# Patient Record
Sex: Female | Born: 1940
Health system: Southern US, Community
[De-identification: ages and names within clinical notes are randomized; demographics above are authoritative.]

## PROBLEM LIST (undated history)

## (undated) DIAGNOSIS — I10 Essential (primary) hypertension: Secondary | ICD-10-CM

## (undated) DIAGNOSIS — I639 Cerebral infarction, unspecified: Secondary | ICD-10-CM

## (undated) DIAGNOSIS — Z8742 Personal history of other diseases of the female genital tract: Secondary | ICD-10-CM

## (undated) DIAGNOSIS — Z8719 Personal history of other diseases of the digestive system: Secondary | ICD-10-CM

## (undated) DIAGNOSIS — E785 Hyperlipidemia, unspecified: Secondary | ICD-10-CM

## (undated) DIAGNOSIS — E559 Vitamin D deficiency, unspecified: Secondary | ICD-10-CM

## (undated) DIAGNOSIS — M1990 Unspecified osteoarthritis, unspecified site: Secondary | ICD-10-CM

## (undated) DIAGNOSIS — K635 Polyp of colon: Secondary | ICD-10-CM

## (undated) DIAGNOSIS — D649 Anemia, unspecified: Secondary | ICD-10-CM

## (undated) HISTORY — PX: TONSILLECTOMY: SUR1361

## (undated) HISTORY — DX: Personal history of other diseases of the digestive system: Z87.19

## (undated) HISTORY — PX: APPENDECTOMY: SHX54

## (undated) HISTORY — DX: Polyp of colon: K63.5

## (undated) HISTORY — DX: Vitamin D deficiency, unspecified: E55.9

## (undated) HISTORY — PX: KNEE SURGERY: SHX244

## (undated) HISTORY — DX: Personal history of other diseases of the female genital tract: Z87.42

## (undated) HISTORY — DX: Unspecified osteoarthritis, unspecified site: M19.90

## (undated) HISTORY — DX: Anemia, unspecified: D64.9

## (undated) HISTORY — DX: Hyperlipidemia, unspecified: E78.5

## (undated) HISTORY — PX: LAPAROSCOPY: SHX197

## (undated) HISTORY — PX: KNEE ARTHROSCOPY: SUR90

---

## 1968-11-11 HISTORY — PX: LAPAROSCOPY: SHX197

## 1974-03-13 HISTORY — PX: OTHER SURGICAL HISTORY: SHX169

## 2000-02-11 LAB — HM DEXA SCAN: HM Dexa Scan: NORMAL

## 2004-01-08 ENCOUNTER — Other Ambulatory Visit: Admission: RE | Admit: 2004-01-08 | Discharge: 2004-01-08 | Payer: Self-pay | Admitting: Family Medicine

## 2004-02-24 ENCOUNTER — Ambulatory Visit: Payer: Self-pay | Admitting: Family Medicine

## 2004-02-25 LAB — FECAL OCCULT BLOOD, GUAIAC: Fecal Occult Blood: NEGATIVE

## 2004-03-01 ENCOUNTER — Ambulatory Visit: Payer: Self-pay | Admitting: Family Medicine

## 2004-03-16 ENCOUNTER — Ambulatory Visit: Payer: Self-pay | Admitting: Family Medicine

## 2004-03-24 ENCOUNTER — Ambulatory Visit: Payer: Self-pay | Admitting: Family Medicine

## 2004-05-06 ENCOUNTER — Ambulatory Visit: Payer: Self-pay | Admitting: Family Medicine

## 2004-06-23 ENCOUNTER — Ambulatory Visit: Payer: Self-pay | Admitting: Family Medicine

## 2004-07-20 ENCOUNTER — Ambulatory Visit: Payer: Self-pay | Admitting: Family Medicine

## 2004-10-10 ENCOUNTER — Ambulatory Visit: Payer: Self-pay | Admitting: Family Medicine

## 2004-10-12 ENCOUNTER — Encounter: Admission: RE | Admit: 2004-10-12 | Discharge: 2004-10-12 | Payer: Self-pay | Admitting: Family Medicine

## 2005-08-17 ENCOUNTER — Ambulatory Visit: Payer: Self-pay | Admitting: Family Medicine

## 2006-01-24 ENCOUNTER — Ambulatory Visit: Payer: Self-pay | Admitting: Family Medicine

## 2006-01-24 ENCOUNTER — Encounter: Payer: Self-pay | Admitting: Family Medicine

## 2006-01-24 ENCOUNTER — Other Ambulatory Visit: Admission: RE | Admit: 2006-01-24 | Discharge: 2006-01-24 | Payer: Self-pay | Admitting: Family Medicine

## 2006-01-24 LAB — CONVERTED CEMR LAB: Pap Smear: NORMAL

## 2006-02-05 ENCOUNTER — Ambulatory Visit: Payer: Self-pay | Admitting: Family Medicine

## 2006-10-22 ENCOUNTER — Encounter: Payer: Self-pay | Admitting: Family Medicine

## 2006-10-22 DIAGNOSIS — R740 Nonspecific elevation of levels of transaminase and lactic acid dehydrogenase [LDH]: Secondary | ICD-10-CM

## 2006-10-22 DIAGNOSIS — R7401 Elevation of levels of liver transaminase levels: Secondary | ICD-10-CM | POA: Insufficient documentation

## 2006-10-22 DIAGNOSIS — G252 Other specified forms of tremor: Secondary | ICD-10-CM

## 2006-10-22 DIAGNOSIS — G250 Essential tremor: Secondary | ICD-10-CM | POA: Insufficient documentation

## 2006-10-22 DIAGNOSIS — E785 Hyperlipidemia, unspecified: Secondary | ICD-10-CM | POA: Insufficient documentation

## 2006-10-22 DIAGNOSIS — M1990 Unspecified osteoarthritis, unspecified site: Secondary | ICD-10-CM | POA: Insufficient documentation

## 2006-10-22 DIAGNOSIS — F609 Personality disorder, unspecified: Secondary | ICD-10-CM | POA: Insufficient documentation

## 2006-10-23 ENCOUNTER — Ambulatory Visit: Payer: Self-pay | Admitting: Family Medicine

## 2006-10-23 DIAGNOSIS — N950 Postmenopausal bleeding: Secondary | ICD-10-CM | POA: Insufficient documentation

## 2006-11-06 ENCOUNTER — Ambulatory Visit: Payer: Self-pay | Admitting: Family Medicine

## 2006-11-06 ENCOUNTER — Encounter: Payer: Self-pay | Admitting: Family Medicine

## 2006-11-27 ENCOUNTER — Ambulatory Visit: Payer: Self-pay | Admitting: Family Medicine

## 2006-12-11 ENCOUNTER — Ambulatory Visit: Payer: Self-pay | Admitting: Family Medicine

## 2007-07-04 ENCOUNTER — Ambulatory Visit: Payer: Self-pay | Admitting: Family Medicine

## 2007-07-16 ENCOUNTER — Ambulatory Visit: Payer: Self-pay | Admitting: Internal Medicine

## 2007-07-30 ENCOUNTER — Encounter: Payer: Self-pay | Admitting: Internal Medicine

## 2007-07-30 ENCOUNTER — Ambulatory Visit: Payer: Self-pay | Admitting: Internal Medicine

## 2007-08-02 ENCOUNTER — Encounter: Payer: Self-pay | Admitting: Internal Medicine

## 2007-10-24 ENCOUNTER — Encounter: Payer: Self-pay | Admitting: Family Medicine

## 2007-11-12 DIAGNOSIS — Z8719 Personal history of other diseases of the digestive system: Secondary | ICD-10-CM

## 2007-11-12 HISTORY — DX: Personal history of other diseases of the digestive system: Z87.19

## 2007-11-30 ENCOUNTER — Inpatient Hospital Stay: Payer: Self-pay | Admitting: Internal Medicine

## 2007-12-01 ENCOUNTER — Encounter: Payer: Self-pay | Admitting: Family Medicine

## 2007-12-04 ENCOUNTER — Encounter: Payer: Self-pay | Admitting: Family Medicine

## 2007-12-11 ENCOUNTER — Ambulatory Visit: Payer: Self-pay | Admitting: Family Medicine

## 2007-12-11 DIAGNOSIS — Z8719 Personal history of other diseases of the digestive system: Secondary | ICD-10-CM | POA: Insufficient documentation

## 2007-12-13 LAB — CONVERTED CEMR LAB
Calcium: 9.2 mg/dL (ref 8.4–10.5)
GFR calc Af Amer: 128 mL/min
GFR calc non Af Amer: 106 mL/min
Potassium: 3.9 meq/L (ref 3.5–5.1)
Sodium: 136 meq/L (ref 135–145)
Total CHOL/HDL Ratio: 4.1
Triglycerides: 94 mg/dL (ref 0–149)

## 2008-05-25 ENCOUNTER — Ambulatory Visit: Payer: Self-pay | Admitting: Family Medicine

## 2008-06-04 ENCOUNTER — Encounter: Payer: Self-pay | Admitting: Family Medicine

## 2008-06-07 ENCOUNTER — Encounter: Admission: RE | Admit: 2008-06-07 | Discharge: 2008-06-07 | Payer: Self-pay | Admitting: Surgery

## 2008-06-25 ENCOUNTER — Inpatient Hospital Stay (HOSPITAL_COMMUNITY): Admission: EM | Admit: 2008-06-25 | Discharge: 2008-06-29 | Payer: Self-pay | Admitting: Emergency Medicine

## 2008-06-25 ENCOUNTER — Encounter: Payer: Self-pay | Admitting: Internal Medicine

## 2008-06-25 ENCOUNTER — Ambulatory Visit: Payer: Self-pay | Admitting: Internal Medicine

## 2008-06-25 DIAGNOSIS — E876 Hypokalemia: Secondary | ICD-10-CM | POA: Insufficient documentation

## 2008-06-26 ENCOUNTER — Encounter: Payer: Self-pay | Admitting: Family Medicine

## 2008-06-27 ENCOUNTER — Encounter: Payer: Self-pay | Admitting: Family Medicine

## 2008-08-06 ENCOUNTER — Ambulatory Visit: Payer: Self-pay | Admitting: Family Medicine

## 2008-08-11 ENCOUNTER — Encounter: Payer: Self-pay | Admitting: Family Medicine

## 2008-08-13 ENCOUNTER — Encounter (INDEPENDENT_AMBULATORY_CARE_PROVIDER_SITE_OTHER): Payer: Self-pay | Admitting: *Deleted

## 2008-08-13 LAB — CONVERTED CEMR LAB
Eosinophils Absolute: 0.1 10*3/uL (ref 0.0–0.7)
Lymphocytes Relative: 47.1 % — ABNORMAL HIGH (ref 12.0–46.0)
Lymphs Abs: 1.4 10*3/uL (ref 0.7–4.0)
MCHC: 35 g/dL (ref 30.0–36.0)
Monocytes Relative: 10 % (ref 3.0–12.0)
Neutro Abs: 1.3 10*3/uL — ABNORMAL LOW (ref 1.4–7.7)
Neutrophils Relative %: 40.9 % — ABNORMAL LOW (ref 43.0–77.0)
Platelets: 143 10*3/uL — ABNORMAL LOW (ref 150.0–400.0)
RDW: 13 % (ref 11.5–14.6)
WBC: 3.1 10*3/uL — ABNORMAL LOW (ref 4.5–10.5)

## 2009-06-18 ENCOUNTER — Ambulatory Visit: Payer: Self-pay | Admitting: Family Medicine

## 2009-06-18 DIAGNOSIS — D649 Anemia, unspecified: Secondary | ICD-10-CM | POA: Insufficient documentation

## 2009-06-18 DIAGNOSIS — R030 Elevated blood-pressure reading, without diagnosis of hypertension: Secondary | ICD-10-CM | POA: Insufficient documentation

## 2009-06-21 LAB — CONVERTED CEMR LAB
ALT: 23 units/L (ref 0–35)
AST: 32 units/L (ref 0–37)
BUN: 10 mg/dL (ref 6–23)
Basophils Absolute: 0 10*3/uL (ref 0.0–0.1)
Calcium: 9.5 mg/dL (ref 8.4–10.5)
Cholesterol: 222 mg/dL — ABNORMAL HIGH (ref 0–200)
Creatinine, Ser: 0.5 mg/dL (ref 0.4–1.2)
Direct LDL: 118.7 mg/dL
HCT: 33.3 % — ABNORMAL LOW (ref 36.0–46.0)
HDL: 96.6 mg/dL (ref >39.00)
Hemoglobin: 11.5 g/dL — ABNORMAL LOW (ref 12.0–15.0)
Lymphocytes Relative: 42.5 % (ref 12.0–46.0)
MCHC: 34.6 g/dL (ref 30.0–36.0)
Monocytes Relative: 10.6 % (ref 3.0–12.0)
Platelets: 167 10*3/uL (ref 150.0–400.0)
RDW: 13.4 % (ref 11.5–14.6)
Sodium: 135 meq/L (ref 135–145)
TSH: 1.21 microintl units/mL (ref 0.35–5.50)
Triglycerides: 44 mg/dL (ref 0.0–149.0)

## 2009-07-06 ENCOUNTER — Ambulatory Visit: Payer: Self-pay | Admitting: Family Medicine

## 2009-09-30 ENCOUNTER — Telehealth: Payer: Self-pay | Admitting: Family Medicine

## 2010-01-10 ENCOUNTER — Ambulatory Visit: Payer: Self-pay | Admitting: Family Medicine

## 2010-01-10 LAB — CONVERTED CEMR LAB
Basophils Absolute: 0 10*3/uL (ref 0.0–0.1)
Eosinophils Relative: 2.5 % (ref 0.0–5.0)
Ferritin: 30.5 ng/mL (ref 10.0–291.0)
Iron: 92 ug/dL (ref 42–145)
Lymphs Abs: 1.8 10*3/uL (ref 0.7–4.0)
MCHC: 34.4 g/dL (ref 30.0–36.0)
Monocytes Absolute: 0.5 10*3/uL (ref 0.1–1.0)
Neutro Abs: 3 10*3/uL (ref 1.4–7.7)
Neutrophils Relative %: 55.5 % (ref 43.0–77.0)
Platelets: 175 10*3/uL (ref 150.0–400.0)
RDW: 13.1 % (ref 11.5–14.6)
Transferrin: 289.2 mg/dL (ref 212.0–360.0)
WBC: 5.4 10*3/uL (ref 4.5–10.5)

## 2010-01-17 ENCOUNTER — Ambulatory Visit: Payer: Self-pay | Admitting: Family Medicine

## 2010-04-14 NOTE — Progress Notes (Signed)
Summary: wants hemoglobin checked  Phone Note Call from Patient Call back at Home Phone [phone]   Caller: Patient Call For: Judith Part MD Summary of Call: Patient came in to office asking if she could have her hemoglobin rechecked. It was last checked in april. Is it okay to put her on for lab schedule. Initial call taken by: Melody Comas,  September 30, 2009 9:46 AM  Follow-up for Phone Call        is fine to order cbc with diff and iron level and ferritin  for anemia  thanks  Follow-up by: Judith Part MD,  September 30, 2009 12:14 PM  Additional Follow-up for Phone Call Additional follow up Details #1::        Left message for patient to call back. Lewanda Rife LPN  September 30, 2009 12:23 PM   Patient notified as instructed by telephone. Pt said she had decided to take some iron capsules and she would call back to schedule lab appt if she decided she wanted it.Lewanda Rife LPN  September 30, 2009 3:23 PM

## 2010-04-14 NOTE — Assessment & Plan Note (Signed)
Summary: [facility]U TO DISCUSS POSSIBLE LABS NEEDED   Vital Signs:  Patient profile:   70 year old female Height:      65 inches Weight:      132.50 pounds BMI:     22.13 Temp:     97.6 degrees F oral Pulse rate:   60 / minute Pulse rhythm:   regular BP sitting:   160 / 78  (left arm) Cuff size:   regular  Vitals Entered By: Lewanda Rife LPN (June 18, 8117 11:48 AM)  Serial Vital Signs/Assessments:  Time      Position  BP       Pulse  Resp  Temp     By                     140/80                         Judith Part MD  CC: wants to discuss possible lab   History of Present Illness: has been feeling good overall   is concerned with her blood sugar and also cholesterol   had low hb and low wbc at last visit -- she had sched labs and never came back for them  she does not remember planning this or talking to the nurse  she does not think she has any memory problems, however either   has gained some of her weight back  is eating toomuch sugar she thinks and starting to eat 2 eggs and salmon  she seems vaguely upset about her wt gain but I assured she is at a more healthy wt down   some emotional ups and downs with stress -- does not want counseling or mental health care at this time no new -- just her usual  does not want to talk about it further  no more abd pain at all - that problem is over - /past abd pain and completely refused workup for it   thinks her father had HTN  bp is up today this distresses her greatly        Allergies: 1)  ! Penicillin 2)  Sulfa  Past History:  Past Medical History: Last updated: 06/25/2008 Hyperlipidemia Osteoarthritis- knee post men vag bleeding- gyn eval (?polyp) colon polyp pschizoaffective disorder / paranoid personality (refuses tx)   GI ---Dr Leone Pay[facility] psychiatry (past)-- Dr Alycia Rossetti  Past Surgical History: Last updated: 12/09/2007 Tonsillectomy Aarthroscopy right knee Lap- twisted fallopian tube  (1478'G) Appendectomy Bladder tack (1976) Colon polyps, per old records Dexa- normal (02/2000) Spirometry- normal (12/1999) Abd [place]- neg  (04/2004) 5/09 colonoscopy- adenomatous colon polyp- re check 5 years 9/09 hospitalization - small bowel obst (likely due to adhesions)-pt refused most dx and tx in hospital  Family History: Last updated: 10/22/2006 Father: heart problems, HTN, DM Mother: heart problems, DM Siblings:   Social History: Last updated: 06/25/2008 Marital Status: Married? (tells me she lives alone) Children: 3 Occupation: retired Runner, broadcasting/film/video non smoker   Risk Factors: Alcohol Use: 0 (06/25/2008)  Risk Factors: Smoking Status: never (06/25/2008)  Review of Systems General:  Complains of fatigue; denies chills, fever, loss of appetite, malaise, and weight loss. Eyes:  Denies blurring and eye pain. ENT:  Denies sinus pressure and sore throat. CV:  Denies chest pain or discomfort, palpitations, shortness of breath with exertion, and swelling of feet. Resp:  Denies cough and wheezing. GI:  Denies abdominal pain, bloody stools, change in bowel habits,  hemorrhoids, indigestion, loss of appetite, nausea, and vomiting. GU:  Denies discharge, dysuria, hematuria, and urinary frequency. MS:  Denies joint pain, joint redness, and joint swelling. Derm:  Denies itching, lesion(s), poor wound healing, and rash. Neuro:  Denies numbness and tingling. Psych:  Complains of anxiety and depression; denies panic attacks, sense of great danger, and suicidal thoughts/plans; does not want to elaborate on her mental state -- she still refuses psychiatric care. Endo:  Denies excessive thirst, excessive urination, and heat intolerance. Heme:  Denies abnormal bruising and bleeding.  Physical Exam  General:  Well-developed,well-nourished,in no acute distress; alert,appropriate and cooperative throughout examination Head:  normocephalic, atraumatic, and no abnormalities observed.   Eyes:   vision grossly intact, pupils equal, pupils round, and pupils reactive to light.  no conjunctival pallor, injection or icterus  Ears:  R ear normal and L ear normal.   Nose:  no nasal discharge.   Mouth:  pharynx pink and moist.   Neck:  supple with full rom and no masses or thyromegally, no JVD or carotid bruit  Chest Wall:  No deformities, masses, or tenderness noted. Lungs:  Normal respiratory effort, chest expands symmetrically. Lungs are clear to auscultation, no crackles or wheezes. Heart:  Normal rate and regular rhythm. S1 and S2 normal without gallop, murmur, click, rub or other extra sounds. Abdomen:  Bowel sounds positive,abdomen soft and non-tender without masses, organomegaly or hernias noted. no renal bruits  Msk:  No deformity or scoliosis noted of thoracic or lumbar spine.  no acute joint changes  Pulses:  R and L carotid,radial,femoral,dorsalis pedis and posterior tibial pulses are full and equal bilaterally Extremities:  No clubbing, cyanosis, edema, or deformity noted with normal full range of motion of all joints.   Neurologic:  strength normal in all extremities, gait normal, and DTRs symmetrical and normal.   Skin:  Intact without suspicious lesions or  some lentigos  Cervical Nodes:  No lymphadenopathy noted Axillary Nodes:  No palpable lymphadenopathy Inguinal Nodes:  No significant adenopathy Psych:  gaurded and somewhat odd affect- but no different from her usual  gets upset easily  eye contact is fair    Impression & Recommendations:  Problem # 1:  UNSPECIFIED ANEMIA (ICD-285.9) Assessment New pt failed to f/u for re check last time  check cbc today f/u to disc  no new symptoms  Orders: Venipuncture (04540) TLB-Lipid Panel (80061-LIPID) TLB-BMP (Basic Metabolic Panel-BMET) (80048-METABOL) TLB-CBC Platelet - w/Differential (85025-CBCD) TLB-ALT (SGPT) (84460-ALT) TLB-AST (SGOT) (84450-SGOT) TLB-TSH (Thyroid Stimulating Hormone) (84443-TSH)  Problem #  2:  ELEVATED BLOOD PRESSURE WITHOUT DIAGNOSIS OF HYPERTENSION (ICD-796.2) Assessment: New bp was better on second check today pt concerned about it and has fam hx  handouts given on lifestyle change f/u 2-4 wk for re check disc dash diet  Orders: Venipuncture (98119) TLB-Lipid Panel (80061-LIPID) TLB-BMP (Basic Metabolic Panel-BMET) (80048-METABOL) TLB-CBC Platelet - w/Differential (85025-CBCD) TLB-ALT (SGPT) (84460-ALT) TLB-AST (SGOT) (84450-SGOT) TLB-TSH (Thyroid Stimulating Hormone) (84443-TSH)  Problem # 3:  HYPERLIPIDEMIA (ICD-272.4) Assessment: Unchanged  check lipids today per pt diet is poor would likely never consider medication Orders: Venipuncture (14782) TLB-Lipid Panel (80061-LIPID) TLB-BMP (Basic Metabolic Panel-BMET) (80048-METABOL) TLB-CBC Platelet - w/Differential (85025-CBCD) TLB-ALT (SGPT) (84460-ALT) TLB-AST (SGOT) (84450-SGOT) TLB-TSH (Thyroid Stimulating Hormone) (84443-TSH)    HDL:45.5 (12/11/2007)  LDL:121 (12/11/2007)  Chol:185 (12/11/2007)  Trig:94 (12/11/2007)  Problem # 4:  HYPOKALEMIA, MILD (ICD-276.8) Assessment: Comment Only in past -- re check this today no cramps or other symptoms Orders: Venipuncture (95621) TLB-Lipid Panel (80061-LIPID) TLB-BMP (  Basic Metabolic Panel-BMET) (80048-METABOL) TLB-CBC Platelet - w/Differential (85025-CBCD) TLB-ALT (SGPT) (84460-ALT) TLB-AST (SGOT) (84450-SGOT) TLB-TSH (Thyroid Stimulating Hormone) (84443-TSH)  Complete Medication List: 1)  B Complex Tabs (B complex vitamins) .... One by mouth daily 2)  Ginkgo Biloba Extr (Ginkgo biloba) .... Take 1 tablet by mouth once a day 3)  Vitamin E  .... Daily 4)  Vitamin C 500 Mg Tabs (Ascorbic acid) .... Take 1 tablet by mouth once a day 5)  Kelp Tabs (Iodine (kelp)) .... Take 1 tablet by mouth once a day 6)  Resevertrol  .... Take 1 tablet by mouth once a day  Patient Instructions: 1)  checking labs today 2)  watch salt and sugar in your diet  3)  follow  up in 2-4 weeks to discuss lab results  4)  here are some handouts on high blood pressure to read   Current Allergies (reviewed today): ! PENICILLIN SULFA

## 2010-04-14 NOTE — Assessment & Plan Note (Signed)
Summary: TO DISCUSS LABS  LABS DONE ON 10/31/[facility]   Vital Signs:  Patient profile:   69 year old female Height:      65 inches Weight:      134.50 pounds BMI:     22.46 Temp:     98.2 degrees F oral Pulse rate:   64 / minute Pulse rhythm:   regular BP sitting:   132 / 72  (left arm) Cuff size:   regular  Vitals Entered By: Lewanda Rife LPN (January 17, 2010 12:08 PM) CC: discuss lab results   History of Present Illness: here for f/u of mild anemia   in past iron def -- likely from very restrictive diet recommended she take mvi with iron   she has been taking iron -- is over the counter -- is otc  is not making her constipated   this hb is 11.9-- imp from 11.5 (at lowest was in 10s)  she declines GI workup   iron studies and ferritin are good   wt is down 2 lb  bp 132/72 today  wants to give blood -- was declined last time   has been feeling ok -- overall   pt reports she had a tapeworm over 15 years ago - she remembers passing it  has not noticed symptoms lately   does eat some meats -- eating chicken  is not eating ham  occ beef   is up to date on colonoscopy   Allergies: 1)  ! Penicillin 2)  Sulfa  Past History:  Past Medical History: Last updated: 06/25/2008 Hyperlipidemia Osteoarthritis- knee post men vag bleeding- gyn eval (?polyp) colon polyp pschizoaffective disorder / paranoid personality (refuses tx)   GI ---Dr Leone Payor psychiatry (past)-- Dr Alycia Rossetti  Past Surgical History: Last updated: 12/09/2007 Tonsillectomy Aarthroscopy right knee Lap- twisted fallopian tube (4782'N) Appendectomy Bladder tack (1976) Colon polyps, per old records Dexa- normal (02/2000) Spirometry- normal (12/1999) Abd [place]- neg  (04/2004) 5/09 colonoscopy- adenomatous colon polyp- re check 5 years 9/09 hospitalization - small bowel obst (likely due to adhesions)-pt refused most dx and tx in hospital  Family History: Last updated: 10/22/2006 Father: heart  problems, HTN, DM Mother: heart problems, DM Siblings:   Social History: Last updated: 06/25/2008 Marital Status: Married? (tells me she lives alone) Children: 3 Occupation: retired Runner, broadcasting/film/video non smoker   Risk Factors: Alcohol Use: 0 (06/25/2008)  Risk Factors: Smoking Status: never (06/25/2008)  Review of Systems General:  Complains of fatigue; denies chills, fever, loss of appetite, and malaise. Eyes:  Denies blurring and eye irritation. CV:  Denies chest pain or discomfort, palpitations, shortness of breath with exertion, and swelling of feet. Resp:  Denies cough and shortness of breath. GI:  Denies abdominal pain, bloody stools, diarrhea, indigestion, nausea, and vomiting. GU:  Denies hematuria and urinary frequency. MS:  Denies muscle aches and cramps. Derm:  Denies poor wound healing and rash. Neuro:  Denies numbness and tingling. Endo:  Complains of cold intolerance; denies excessive thirst, excessive urination, and heat intolerance. Heme:  Denies abnormal bruising, bleeding, and enlarge lymph nodes.  Physical Exam  General:  Well-developed,well-nourished,in no acute distress; alert,appropriate and cooperative throughout examination Head:  normocephalic, atraumatic, and no abnormalities observed.   Eyes:  vision grossly intact, pupils equal, pupils round, and pupils reactive to light.  no conjunctival pallor, injection or icterus  Mouth:  pharynx pink and moist.   Neck:  supple with full rom and no masses or thyromegally, no JVD or carotid bruit  Chest Wall:  No deformities, masses, or tenderness noted. Lungs:  Normal respiratory effort, chest expands symmetrically. Lungs are clear to auscultation, no crackles or wheezes. Heart:  Normal rate and regular rhythm. S1 and S2 normal without gallop, murmur, click, rub or other extra sounds. Abdomen:  Bowel sounds positive,abdomen soft and non-tender without masses, organomegaly or hernias noted. no renal bruits  Msk:  No  deformity or scoliosis noted of thoracic or lumbar spine.  no acute joint changes  Pulses:  R and L carotid,radial,femoral,dorsalis pedis and posterior tibial pulses are full and equal bilaterally Extremities:  No clubbing, cyanosis, edema, or deformity noted with normal full range of motion of all joints.   Neurologic:  sensation intact to light touch, gait normal, and DTRs symmetrical and normal.  no tremor  Skin:  Intact without suspicious lesions or rashes no pallor  Cervical Nodes:  No lymphadenopathy noted Inguinal Nodes:  No significant adenopathy Psych:  affect is mildly paranoid today   Impression & Recommendations:  Problem # 1:  UNSPECIFIED ANEMIA (ICD-285.9) Assessment Improved iron def anemia in past - imp with otc iron (though pt does not know name or dose) rev labs with pt in detail  she declines GI eval for this -- enc her to call if she changes her mind does have hx of colon polyps  do not think parasites are involved  disc diet -- little beef/ it trying to get iron from other sources  I offered to re check cbc in 3 mo -- she declines at this time adv to update me if she develops any new symptoms - especially GI  Complete Medication List: 1)  B Complex Tabs (B complex vitamins) .... One by mouth daily 2)  Ginkgo Biloba Extr (Ginkgo biloba) .... Take 1 tablet by mouth once a day 3)  Vitamin E  .... Daily 4)  Vitamin C 500 Mg Tabs (Ascorbic acid) .... Take 1 tablet by mouth once a day 5)  Kelp Tabs (Iodine (kelp)) .... Take 1 tablet by mouth once a day 6)  Resevertrol  .... Take 1 tablet by mouth once a day  Patient Instructions: 1)  continue your iron  2)  let me know what brand you are taking and what mg dose so I can put it on your med list  3)  keep working on getting iron in the diet  4)  let me know if you want GI doctor evaluation for anemia in the future  5)  your colonoscopy will be due in May of 2014  6)  if you have abdominal symptoms or blood in stool,  please let me know    Orders Added: 1)  Est. Patient Level III [65784]    Current Allergies (reviewed today): ! PENICILLIN SULFA

## 2010-04-14 NOTE — Assessment & Plan Note (Signed)
Summary: 2-4 WEEK FOLLOW UP/[facility]   Vital Signs:  Patient profile:   70 year old female Height:      65 inches Weight:      136 pounds BMI:     22.71 Temp:     98.2 degrees F oral Pulse rate:   60 / minute Pulse rhythm:   regular BP sitting:   154 / 74  (left arm) Cuff size:   regular  Vitals Entered By: Lewanda Rife LPN (July 06, 2009 3:09 PM)  Serial Vital Signs/Assessments:  Time      Position  BP       Pulse  Resp  Temp     By           R Arm     130/80                         Colon Flattery Collis Thede MD           L Arm     125/80                         Judith Part MD  CC: 2-4 week f/u   History of Present Illness: here for f/u of anemia and cholesterol and elevated bp   bp first check today 154/74  lipids improved with excellent hdl of 96 and LDL 118 diet could be a bit better she thinks   hb is 11.5- also improved , with nl wbc overall feels ok   still very emotionally stressed with things going on , but feels physically ok     Allergies: 1)  ! Penicillin 2)  Sulfa  Past History:  Past Medical History: Last updated: 06/25/2008 Hyperlipidemia Osteoarthritis- knee post men vag bleeding- gyn eval (?polyp) colon polyp pschizoaffective disorder / paranoid personality (refuses tx)   GI ---Dr Leone Payor psychiatry (past)-- Dr Alycia Rossetti  Past Surgical History: Last updated: 12/09/2007 Tonsillectomy Aarthroscopy right knee Lap- twisted fallopian tube (1610'R) Appendectomy Bladder tack (1976) Colon polyps, per old records Dexa- normal (02/2000) Spirometry- normal (12/1999) Abd [place]- neg  (04/2004) 5/09 colonoscopy- adenomatous colon polyp- re check 5 years 9/09 hospitalization - small bowel obst (likely due to adhesions)-pt refused most dx and tx in hospital  Family History: Last updated: 10/22/2006 Father: heart problems, HTN, DM Mother: heart problems, DM Siblings:   Social History: Last updated: 06/25/2008 Marital Status: Married? (tells me she lives  alone) Children: 3 Occupation: retired Runner, broadcasting/film/video non smoker   Risk Factors: Alcohol Use: 0 (06/25/2008)  Risk Factors: Smoking Status: never (06/25/2008)  Review of Systems General:  Complains of fatigue; denies fever, loss of appetite, and malaise. Eyes:  Denies blurring and eye irritation. CV:  Denies chest pain or discomfort, palpitations, shortness of breath with exertion, and swelling of feet. Resp:  Denies cough and wheezing. GI:  Denies abdominal pain, bloody stools, change in bowel habits, and indigestion. GU:  Denies dysuria and urinary frequency. MS:  Denies joint pain, joint redness, joint swelling, muscle aches, and muscle weakness. Derm:  Denies itching, lesion(s), poor wound healing, and rash. Neuro:  Denies numbness and tingling. Psych:  Denies sense of great danger and suicidal thoughts/plans. Endo:  Denies cold intolerance, excessive thirst, excessive urination, and heat intolerance. Heme:  Denies abnormal bruising and bleeding.  Physical Exam  General:  Well-developed,well-nourished,in no acute distress; alert,appropriate and cooperative throughout examination Head:  normocephalic, atraumatic, and no abnormalities observed.  Eyes:  vision grossly intact, pupils equal, pupils round, and pupils reactive to light.  no conjunctival pallor, injection or icterus  Mouth:  pharynx pink and moist.   Neck:  supple with full rom and no masses or thyromegally, no JVD or carotid bruit  Lungs:  Normal respiratory effort, chest expands symmetrically. Lungs are clear to auscultation, no crackles or wheezes. Heart:  Normal rate and regular rhythm. S1 and S2 normal without gallop, murmur, click, rub or other extra sounds. Abdomen:  soft, non-tender, and normal bowel sounds.   Extremities:  No clubbing, cyanosis, edema, or deformity noted with normal full range of motion of all joints.   Neurologic:  gait normal and DTRs symmetrical and normal.   Skin:  Intact without suspicious  lesions or  some lentigos  Cervical Nodes:  No lymphadenopathy noted Psych:  gaurded and somewhat odd affect- but no different from her usual  less anxious today  eye contact is fair    Impression & Recommendations:  Problem # 1:  UNSPECIFIED ANEMIA (ICD-285.9) Assessment Improved much imp this check  has had restrictive diet in past that is better I recommended adding mvi with iron  pt does not want GI w/u at this time  rev labs with pt in detail  Problem # 2:  ELEVATED BLOOD PRESSURE WITHOUT DIAGNOSIS OF HYPERTENSION (ICD-796.2) Assessment: Improved  this again was much imp on second check while relaxed urged to keep up good habits and will continue to monitor   BP today: 154/74-- re check 125/80 at rest  Prior BP: 160/78 (06/18/2009)  Labs Reviewed: Creat: 0.5 (06/18/2009) Chol: 222 (06/18/2009)   HDL: 96.60 (06/18/2009)   LDL: 121 (12/11/2007)   TG: 44.0 (06/18/2009)  Instructed in low sodium diet (DASH Handout) and behavior modification.    Problem # 3:  HYPERLIPIDEMIA (ICD-272.4) Assessment: Improved  good results with very high HDL - likely from fish consumption rev low sat fat diet and commended on this  will continue to  monitor   Labs Reviewed: SGOT: 32 (06/18/2009)   SGPT: 23 (06/18/2009)   HDL:96.60 (06/18/2009), 45.5 (12/11/2007)  LDL:121 (12/11/2007)  Chol:222 (06/18/2009), 185 (12/11/2007)  Trig:44.0 (06/18/2009), 94 (12/11/2007)  Complete Medication List: 1)  B Complex Tabs (B complex vitamins) .... One by mouth daily 2)  Ginkgo Biloba Extr (Ginkgo biloba) .... Take 1 tablet by mouth once a day 3)  Vitamin E  .... Daily 4)  Vitamin C 500 Mg Tabs (Ascorbic acid) .... Take 1 tablet by mouth once a day 5)  Kelp Tabs (Iodine (kelp)) .... Take 1 tablet by mouth once a day 6)  Resevertrol  .... Take 1 tablet by mouth once a day  Patient Instructions: 1)  get a multivitamin with iron to help your hemoglobin  2)  cholesterol is better  3)  sugar and  other labs are fine  4)  keep up healthy diet   Current Allergies (reviewed today): ! PENICILLIN SULFA

## 2010-06-22 LAB — DIFFERENTIAL
Eosinophils Relative: 2 % (ref 0–5)
Eosinophils Relative: 0 % (ref 0–5)
Lymphocytes Relative: 29 % (ref 12–46)
Lymphocytes Relative: 40 % (ref 12–46)
Monocytes Absolute: 0.2 10*3/uL (ref 0.1–1.0)
Monocytes Absolute: 0.3 10*3/uL (ref 0.1–1.0)
Monocytes Absolute: 0.6 10*3/uL (ref 0.1–1.0)
Monocytes Relative: 6 % (ref 3–12)
Neutro Abs: 1.6 10*3/uL — ABNORMAL LOW (ref 1.7–7.7)
Neutro Abs: 7.8 10*3/uL — ABNORMAL HIGH (ref 1.7–7.7)
Neutrophils Relative %: 49 % (ref 43–77)
Neutrophils Relative %: 86 % — ABNORMAL HIGH (ref 43–77)

## 2010-06-22 LAB — CBC
HCT: 31.5 % — ABNORMAL LOW (ref 36.0–46.0)
HCT: 42.8 % (ref 36.0–46.0)
Hemoglobin: 10.4 g/dL — ABNORMAL LOW (ref 12.0–15.0)
Hemoglobin: 14.8 g/dL (ref 12.0–15.0)
MCHC: 34.5 g/dL (ref 30.0–36.0)
MCHC: 34.8 g/dL (ref 30.0–36.0)
MCHC: 35.1 g/dL (ref 30.0–36.0)
MCHC: 34.6 g/dL (ref 30.0–36.0)
MCV: 92.5 fL (ref 78.0–100.0)
Platelets: 140 10*3/uL — ABNORMAL LOW (ref 150–400)
RBC: 3.21 MIL/uL — ABNORMAL LOW (ref 3.87–5.11)
RBC: 4.6 MIL/uL (ref 3.87–5.11)
RDW: 13.6 % (ref 11.5–15.5)
RDW: 13.7 % (ref 11.5–15.5)
RDW: 13.6 % (ref 11.5–15.5)
WBC: 3.2 10*3/uL — ABNORMAL LOW (ref 4.0–10.5)
WBC: 4.2 10*3/uL (ref 4.0–10.5)

## 2010-06-22 LAB — COMPREHENSIVE METABOLIC PANEL
ALT: 35 U/L (ref 0–35)
Alkaline Phosphatase: 61 U/L (ref 39–117)
BUN: 13 mg/dL (ref 6–23)
CO2: 20 mEq/L (ref 19–32)
GFR calc non Af Amer: >60 mL/min (ref >60)
Glucose, Bld: 133 mg/dL — ABNORMAL HIGH (ref 70–99)
Potassium: 3.7 mEq/L (ref 3.5–5.1)
Sodium: 134 mEq/L — ABNORMAL LOW (ref 135–145)
Total Protein: 7 g/dL (ref 6.0–8.3)

## 2010-06-22 LAB — URINALYSIS, ROUTINE W REFLEX MICROSCOPIC
Bilirubin Urine: NEGATIVE
Glucose, UA: NEGATIVE mg/dL
Hgb urine dipstick: NEGATIVE
Ketones, ur: >80 mg/dL — AB
pH: 6 (ref 5.0–8.0)

## 2010-06-22 LAB — BASIC METABOLIC PANEL
BUN: 2 mg/dL — ABNORMAL LOW (ref 6–23)
CO2: 22 mEq/L (ref 19–32)
CO2: 24 mEq/L (ref 19–32)
CO2: 28 mEq/L (ref 19–32)
Chloride: 104 mEq/L (ref 96–112)
Chloride: 105 mEq/L (ref 96–112)
Chloride: 105 mEq/L (ref 96–112)
Creatinine, Ser: 0.46 mg/dL (ref 0.4–1.2)
GFR calc Af Amer: >60 mL/min (ref >60)
GFR calc Af Amer: >60 mL/min (ref >60)
GFR calc Af Amer: >60 mL/min (ref >60)
GFR calc non Af Amer: >60 mL/min (ref >60)
Glucose, Bld: 78 mg/dL (ref 70–99)
Glucose, Bld: 85 mg/dL (ref 70–99)
Potassium: 3.7 mEq/L (ref 3.5–5.1)
Potassium: 3.9 mEq/L (ref 3.5–5.1)
Sodium: 136 mEq/L (ref 135–145)
Sodium: 136 mEq/L (ref 135–145)

## 2010-06-22 LAB — CA 125: CA 125: 3.5 U/mL (ref 0.0–30.2)

## 2010-07-26 NOTE — Discharge Summary (Signed)
[name], [name]               ACCOUNT NO.:  [id number]   MEDICAL RECORD NO.:  [id number]          PATIENT TYPE:  INP   LOCATION:  5149                         FACILITY:  [facility]   PHYSICIAN:  Gordy Savers, MDDATE OF BIRTH:  12/29/1940   DATE OF ADMISSION:  06/25/2008  DATE OF DISCHARGE:  06/29/2008                               DISCHARGE SUMMARY   FINAL DIAGNOSIS:  Small-bowel obstruction, resolved.   ADDITIONAL DIAGNOSES:  1. Ascites.  2. History of voluntary weight loss.  3. Hypokalemia, resolved.  4. Remote 20-pack year smoker.  5. History of colonic polyps.  6. Anemia.   DISCHARGE MEDICATIONS:  Tylenol p.r.n., multivitamins.   HISTORY OF PRESENT ILLNESS:  The patient is a 70 year old white female,  who presented to the ED with a 24-hour history of worsening abdominal  pain, associated with nausea and vomiting.  One day prior to admission,  she had normal bowel movement.  In September of last year, she was  admitted to [facility] for similar symptoms that resolved without  surgical intervention.  The patient underwent a CT scan of the abdomen  and pelvis that demonstrated a partial small-bowel obstruction.  It also  revealed generalized ascites in the abdominal and pelvic regions.  The  patient is subsequently admitted for further evaluation and treatment of  her partial small-bowel obstruction.   PAST MEDICAL HISTORY:  The patient has an allergy to PENICILLIN and  SULFA.  The patient has mild dyslipidemia, osteoarthritis, and a history  of postmenopausal vaginal bleeding, has a history also of colonic  polyps.  Her last colonoscopy was in May 2009.   PHYSICAL EXAMINATION:  GENERAL:  An alert, well-developed, well-  nourished female, cooperative, in no acute distress.  General exam was  fairly noncontributory.  ABDOMEN:  Soft and nontender with diminished bowel sounds.  There is no  significant distention or tenderness.  There is no appreciable  organomegaly.  No fluid wave could be appreciated.   LABORATORY DATA AND HOSPITAL COURSE:  The patient was admitted to  hospital and supported with IV fluids.  A General Surgical consult was  obtained that followed the patient through the hospital.  Serial 2-way  abdominal x-rays were reviewed and at the time of discharge had total  resolution of her distended loops of bowel.  The CT of the abdomen and  pelvis with contrast revealed diffuse abdominal ascites of undetermined  etiology.  The patient was seen by Interventional Radiology for  ultrasound-guided paracentesis.  Limited ultrasound of the abdomen did  confirm diffuse ascites but without a window for percutaneous  aspiration.  It was felt the risk of bowel obstruction was too great and  paracentesis was not performed.  The hospital course was marked by  steady improvement.  At the time of discharge, she was tolerating a  normal diet and had no GI complaints.  Laboratory studies included a  normal albumin of 4.6.  BNP was normal at 60.  TSH normal.  CA-125  normal at 3.5.  It should also be noted that CT of the abdomen revealed  a left adnexal  cyst that appeared to be benign and unchanged compared to  an ultrasound in August 2006.   DISPOSITION:  The patient will be discharged today on her preadmission  regimen of vitamin supplements but on no new prescription medications.  She has been asked to follow up with her primary care Makynli Stills towards  the end of the week and will be considered for a repeat abdominopelvic  ultrasound at that time.  Due to her mild anemia and history of ascites,  we will consider outpatient Hematology/Oncology or GI evaluation as an  outpatient.   CONDITION ON DISCHARGE:  Improved.      Gordy Savers, MD  Electronically Signed     PFK/MEDQ  D:  06/29/2008  T:  06/30/2008  Job:  [id number]

## 2010-07-26 NOTE — Consult Note (Signed)
[name], [name]               ACCOUNT NO.:  [id number]   MEDICAL RECORD NO.:  [id number]          PATIENT TYPE:  INP   LOCATION:  5149                         FACILITY:  [facility]   PHYSICIAN:  Gabrielle Dare. Janee Morn, M.D.DATE OF BIRTH:  Nov 25, 1940   DATE OF CONSULTATION:  06/25/2008  DATE OF DISCHARGE:                                 CONSULTATION   CHIEF COMPLAINT:  Abdominal pain, nausea, and vomiting.   HISTORY OF PRESENT ILLNESS:  Ms. Fults is a 70 year old white female  who presents complaining of a 24-hour history of abdominal pain with  associated nausea and vomiting.  She claimed she had a large normal  bowel movement yesterday, but none since.  She came for evaluation in  the emergency department.  Since she has been here, her pain has almost  completely resolved.  She was admitted with the similar episode to  [facility] last September, but that resolved without any surgical  intervention.  She underwent CT scan of the abdomen and pelvis  demonstrating partial small bowel obstructive versus focal ileus  involving small bowel and her pelvis as well as generalized ascites.  We  are asked to see her from the surgical standpoint in consultation.   PAST MEDICAL HISTORY:  1. Osteoarthritis.  2. Hyperlipidemia.  3. Schizoaffective disorder, she declines treatment for her      schizoaffective disorder.   PAST SURGICAL HISTORY:  Right knee surgery, she also had an  exploration  for what sounds like a torsed fallopian tube at which time, appendectomy  was also done.  In addition, she has had bladder tacking.   SOCIAL HISTORY:  She does not smoke.  She does not drink alcohol.  She  is a retired Runner, broadcasting/film/video.   MEDICATIONS:  Supplements, but no prescriptions.   ALLERGIES:  PENICILLIN.   REVIEW OF SYSTEMS:  GI SYSTEM:  As above.  MUSCULOSKELETAL SYSTEM:  She  was recently seen by Dr. Luisa Hart from our service for a left thigh  swelling, this seems to be a pseudotumor of her  tensor fascia lata from  radiologic findings.  Review of systems is otherwise negative.   PHYSICAL EXAMINATION:  VITAL SIGNS:  Temperature 97.7, blood pressure  121/55, heart rate 72, respirations 18, and saturations 100%  GENERAL:  She is awake and alert.  She is quite thin and when asked  about this, she said she has lost a lot of weight over the past several  months, though she has been trying to do portion  control.  HEENT:  Pupils are equal.  Oral mucosa is moist.  LUNGS:  Clear to auscultation with no wheezing bilaterally.  CARDIAC:  Heart is regular with no murmurs and pulse is palpable on the  left chest.  ABDOMEN:  Soft.  She has some mild distention in the lower abdomen and  the suprapubic area with mild tenderness.  There is no focal mass felt.  Definitely, no guarding or peritoneal signs.  Bowel sounds are scant.  EXTREMITIES:  Minimal lateral deformities in the left thigh, otherwise  no deformity.  SKIN:  Warm and dry.  DATA REVIEWED:  White blood cell count 9, hemoglobin 14.8, and platelets  177.  Sodium 134, potassium 3.7, chloride 99, CO2 20, BUN 13, creatinine  0.74, glucose 133, AST 36, and ALT 35.  Urinalysis negative.  CT scan of  the abdomen and pelvis as described above.   IMPRESSION:  Partial small bowel obstruction.   RECOMMENDATION:  I agree with medical admission, IV fluids, and bowel  rest.  We will follow along with you and I discussed this with Dr.  Felicity Coyer.  The etiology of her ascites currently is uncertain.  Plan was  discussed in detail with the patient.      Gabrielle Dare Janee Morn, M.D.  Electronically Signed     BET/MEDQ  D:  06/25/2008  T:  06/26/2008  Job:  [id number]   cc:   Vikki Ports A. Felicity Coyer, MD

## 2010-07-26 NOTE — Assessment & Plan Note (Signed)
[name], [name]               ACCOUNT NO.:  [id number]   MEDICAL RECORD NO.:  [id number]          PATIENT TYPE:  POB   LOCATION:  [facility] at [facility]         FACILITY:  [facility]   PHYSICIAN:  Tinnie Gens, MD        DATE OF BIRTH:  May 24, 1940   DATE OF SERVICE:  11/06/2006                                  CLINIC NOTE   CHIEF COMPLAINT:  Referral.   HISTORY OF PRESENT ILLNESS:  The patient is a 70 year old gravida 5,  para 3-0-2-3, who presents today for postmenopausal bleeding.  The  patient has previously been seen by Dr. Roxy Manns.  She had an episode  of postmenopausal bleeding approximately 2-1/2 years ago and underwent  pelvic sonography at that time.  The patient had a thickened endometrium  on that ultrasound but refused further evaluation or workup.  Bleeding  returned approximately 3 months ago with some spotting and then had some  brighter red bleeding in the past couple of weeks.  The patient has come  today for referral and appropriate workup.   PAST MEDICAL HISTORY:  A history of arthritis, paranoia, pneumonia and  elevated cholesterol.   PAST SURGICAL HISTORY:  She had a bladder tacking done in 1980, an  arthroscopic knee surgery on the right in 1989.   MEDICATIONS:  She is on vitamin B one p.o. daily.   ALLERGIES:  PENICILLIN and SULFA.   OBSTETRICAL HISTORY:  G5 P3, three living children.  The biggest child  was 8-1/2 pounds.  Two miscarriages.   GYN HISTORY:  Last Pap was January 2007, last mammogram March 2005.  No  history of abnormal Paps.   FAMILY HISTORY:  Diabetes, heart disease and hypertension.   SOCIAL HISTORY:  The patient lives alone.  She has three children.  One  is in [place], one is in [place], one is in [place].  She has  five grandchildren.  She does not see her family very often.  No  tobacco, alcohol or drug use.   A 14-point review of systems is reviewed.  It is positive for fatigue,  weight gain, problems with hearing,  difficulty with urination in which  she describes and urgency issue, if she goes to the bathroom she has to  get there very quickly or else she will become incontinent.  She denies  any kind of stress-related incontinence.  Vaginal bleeding.  The patient  also reports that it feels like her organs are falling out.   On exam today, in general she is a well-nourished, elderly female in no  acute distress.  Pulse is 59, blood pressure 165/68, weight is 188.  GU:  Normal external female genitalia.  The cervix is visualized at the  introitus, giving her a level III prolapse of the uterus.  There are  some lesions on the cervix noted but nothing that looks suspicious.  The  uterus is small, anteverted.  No adnexal mass or tenderness.   PROCEDURE:  The cervix is cleaned with Betadine x2.  It is grasped  anteriorly with a single-tooth tenaculum, sounded to approximately 8 cm.  Endometrial sampling was obtained; however, very little tissue, probably  very atrophic  endometrial lining was felt to be there.   IMPRESSION:  1. Postmenopausal bleeding.  2. Need for yearly exam.  Plan for yearly Pap smear.  3. Urge incontinence.   PLAN:  1. Pap smear today.  2. Endometrial sampling today.  3. The patient will follow up in 2 weeks for a discussion of results.  4. Discussion was had today that if pathology is benign, pessary use      verses simple vaginal hysterectomy to alleviate her prolapse.  The      patient seemed to be leaning towards hysterectomy at this time as      she is an otherwise fairly healthy female.   Thank you very much for referring this patient.  We will keep you up to  date on her progress as the results become available as well as any  plans that we have.           ______________________________  Tinnie Gens, MD     TP/MEDQ  D:  11/06/2006  T:  11/07/2006  Job:  [id number]   cc:   Kelle Darting  Fax: [phone]

## 2010-07-26 NOTE — Assessment & Plan Note (Signed)
[name], [name]               ACCOUNT NO.:  [id number]   MEDICAL RECORD NO.:  [id number]          PATIENT TYPE:  POB   LOCATION:  [facility]         FACILITY:  [facility]   PHYSICIAN:  Tinnie Gens, MD        DATE OF BIRTH:  1940/12/08   DATE OF SERVICE:  11/27/2006                                  CLINIC NOTE   CHIEF COMPLAINT:  Followup.   HISTORY OF PRESENT ILLNESS:  The patient is a 70 year old patient who is  referred here from Dr. Roxy Manns for postmenopausal bleeding.  The  patient previously underwent pelvic sonogram that showed a thickened  lining.  She had an endometrial biopsy here approximately 2 weeks ago.  However, scant benign columnar mucosa was found with no atypia.  It is  unclear if the columnar mucosa was atrophic endometrium or cervix.  Because it was unclear that the biopsy was completely negative, I  suggested to the patient we take her to the OR for a D&C.  The patient  is very unwilling to have this done, nor is she willing at this point to  have hysterectomy or pessary either for her prolapse.  The patient did  ask a question about her difficulty emptying her bladder and how her  prolapsed uterus might be affecting that.  However, she still seemed  very reluctant to actually undergo any kind of procedure.  I offered her  a pelvic sonogram; the patient refused that.  She did agree to repeat  endometrial sampling today.   EXAMINATION:  Her vitals are as noted in the chart.  She is a well-  developed, well-nourished female in no acute distress, looks stated age.  GU:  Atrophic vagina.  The cervix was prolapsed.  The uterus is  approximately +3.   PROCEDURE:  The cervix was cleaned with Betadine and grasped on the  anteriorly lip with a single-tooth tenaculum.  A dilator was used this  time to get into the endometrial cavity.  The uterus sounded to 9 cm.  Endometrial sampling was obtained.  Again, not a lot of tissue was  gotten, but the Pipelle  was passed x2.   IMPRESSION:  1. Postmenopausal bleeding, cannot rule out endometrial carcinoma.  2. Prolapsed uterus.   PLAN:  Will have the patient follow up in two more weeks for results.  If this biopsy is negative, it is the best I can do to reassure her that  she probably does not have cancer and then it will be up to her if there  are any further treatments she would like to have done.   Again, thank you for this referral and will keep you updated on her  progress.          ______________________________  Tinnie Gens, MD    TP/MEDQ  D:  11/27/2006  T:  11/28/2006  Job:  [id number]   cc:   Roxy Manns, M.D.

## 2010-07-26 NOTE — Assessment & Plan Note (Signed)
[name], [name]               ACCOUNT NO.:  [id number]   MEDICAL RECORD NO.:  [id number]          PATIENT TYPE:  POB   LOCATION:  [facility]         FACILITY:  [facility]   PHYSICIAN:  Johnella Moloney, MD        DATE OF BIRTH:  [birth year]   DATE OF SERVICE:  12/11/2006                                  CLINIC NOTE   CHIEF COMPLAINT:  Follow up visit, discussion of results of endometrial  biopsy.   HISTORY OF PRESENT ILLNESS:  The patient is a 70 year old who was  referred for evaluation of post menopausal bleeding.  For further  details please refer to the notes by Dr. Tinnie Gens and Dr. Nicholaus Bloom  in the patient's chart.  She did undergo a pelvic ultrasound that showed  a thickened lining and underwent an endometrial biopsy on November 27, 2006 by Dr. Tinnie Gens.  She is here to follow up results.  She denies  any further post menopausal bleeding.   No interval change in medical history.   PHYSICAL EXAMINATION:  Deferred.  Blood pressure is 124/64 and weight is 185 pounds.   RESULTS:  Endometrial biopsy pathology shows benign endometrial polyp  and fragments of proliferative endometrium with stromal hemorrhage.  No  malignancy identified.   IMPRESSION:  1. Post menopausal bleeding with negative endometrial biopsy.  2. History of uterine prolapse.   PLAN:  Results were discussed with the patient who was reassured about  her results.  Still, explained to her that there is still a low risk,  less than 1% chance, that she could still have undiagnosed endometrial  carcinoma, especially in the endometrial polyp, that a standard of care  would be to do a dilation and curettage and a polypectomy, but patient  declines this at this point.  She was told if she does have any further  episodes of post menopausal bleeding, that this would be highly  recommended.  The patient understands plan and declines any further  procedures at this time.  As for her prolapsed uterus, also  discussed  options of using pessary versus a hysterectomy.  Discussed pros and cons  of both methods.  The patient, at this point, says that she does not  want to do anything about her uterine prolapse, but will decide on a  method of treatment is her prolapse becomes concerning to her.  The  patient denies  any other symptoms.  The patient was strongly advised to follow up if  there were any further episodes of post menopausal bleeding or any other  gynecological concerns.           ______________________________  Johnella Moloney, MD     UD/MEDQ  D:  12/11/2006  T:  12/11/2006  Job:  [id number]   cc:   Roxy Manns, MD  [address] [place]  [place], [place] [address]

## 2011-01-02 ENCOUNTER — Telehealth: Payer: Self-pay | Admitting: Family Medicine

## 2011-01-02 DIAGNOSIS — E876 Hypokalemia: Secondary | ICD-10-CM

## 2011-01-02 DIAGNOSIS — E785 Hyperlipidemia, unspecified: Secondary | ICD-10-CM

## 2011-01-02 DIAGNOSIS — R030 Elevated blood-pressure reading, without diagnosis of hypertension: Secondary | ICD-10-CM

## 2011-01-02 DIAGNOSIS — R7401 Elevation of levels of liver transaminase levels: Secondary | ICD-10-CM

## 2011-01-02 DIAGNOSIS — D649 Anemia, unspecified: Secondary | ICD-10-CM

## 2011-01-02 NOTE — Telephone Encounter (Signed)
Message copied by Judy Pimple on Mon Jan 02, 2011  9:02 PM ------      Message from: Baldomero Lamy      Created: Wed Dec 28, 2010  9:50 AM      Regarding: Cpx labs Tues10/23       Please order  future cpx labs for pt's upcomming lab appt.      Thanks      Rodney Booze

## 2011-01-03 ENCOUNTER — Other Ambulatory Visit: Payer: Self-pay

## 2011-01-09 ENCOUNTER — Encounter: Payer: Self-pay | Admitting: Family Medicine

## 2011-01-10 ENCOUNTER — Encounter: Payer: Self-pay | Admitting: Family Medicine

## 2011-04-06 ENCOUNTER — Ambulatory Visit (INDEPENDENT_AMBULATORY_CARE_PROVIDER_SITE_OTHER): Payer: MEDICARE | Admitting: Family Medicine

## 2011-04-06 ENCOUNTER — Encounter: Payer: Self-pay | Admitting: Family Medicine

## 2011-04-06 VITALS — BP 130/60 | HR 88 | Temp 100.5°F

## 2011-04-06 DIAGNOSIS — J069 Acute upper respiratory infection, unspecified: Secondary | ICD-10-CM

## 2011-04-06 DIAGNOSIS — J40 Bronchitis, not specified as acute or chronic: Secondary | ICD-10-CM

## 2011-04-06 MED ORDER — AZITHROMYCIN 250 MG PO TABS: ORAL_TABLET | ORAL | Status: AC

## 2011-04-06 MED ORDER — HYDROCOD POLST-CHLORPHEN POLST 10-8 MG/5ML PO LQCR: 5.0000 mL | Freq: Two times a day (BID) | ORAL | Status: DC | PRN

## 2011-04-06 NOTE — Patient Instructions (Signed)
Take antibiotic (zpack) as directed.  Drink lots of fluids.  Treat sympotmatically with Mucinex, nasal saline irrigation, and Tylenol/Ibuprofen. You can use warm compresses.  Cough suppressant at night. Call if not improving as expected in 5-7 days.

## 2011-04-06 NOTE — Progress Notes (Signed)
SUBJECTIVE:  [name] is a 71 y.o. female new to me who complains of coryza, congestion, sneezing, sore throat and productive cough for 8 days. She denies a history of anorexia and chest pain and denies a history of asthma. Patient denies smoke cigarettes.  She has not had a flu shot this year.   Patient Active Problem List  Diagnoses  . HYPERLIPIDEMIA  . HYPOKALEMIA, MILD  . UNSPECIFIED ANEMIA  . PERSONALITY DISORDER  . TREMOR, ESSENTIAL  . BLEEDING, POSTMENOPAUSAL  . OSTEOARTHRITIS  . TRANSAMINASES, SERUM, ELEVATED  . ELEVATED BLOOD PRESSURE WITHOUT DIAGNOSIS OF HYPERTENSION  . INTESTINAL OBSTRUCTION, HX OF   Past Medical History  Diagnosis Date  . Hyperlipidemia   . Osteoarthritis     knee  . History of vaginal bleeding     post menopausal- gyn eval, ? polyp  . Colon polyp   . Schizoaffective disorder     paranoid personality- refuses treatment  . History of small bowel obstruction 9/09    likely due to adhesions- pt refused most dx and tx in hospital   Past Surgical History  Procedure Date  . Tonsillectomy   . Knee arthroscopy     right  . Laparoscopy 1970's    twisted fallopian tube  . Appendectomy   . Bladder tack 1976   History  Substance Use Topics  . Smoking status: Never Smoker   . Smokeless tobacco: Not on file  . Alcohol Use: Not on file   Family History  Problem Relation Age of Onset  . Heart disease Mother   . Diabetes Mother   . Diabetes Father   . Heart disease Father   . Hypertension Father    Allergies  Allergen Reactions  . Penicillins     REACTION: rash/hives  . Sulfonamide Derivatives     REACTION: rash   Current Outpatient Prescriptions on File Prior to Visit  Medication Sig Dispense Refill  . b complex vitamins tablet Take 1 tablet by mouth daily.        Marland Kitchen GINKGO BILOBA COMPLEX PO Take one by mouth daily       . Kelp TABS Take one tablet by mouth daily       . vitamin C (ASCORBIC ACID) 500 MG tablet Take 500 mg by  mouth daily.        Marland Kitchen VITAMIN E PO Take by mouth daily         OBJECTIVE: BP 130/60  Pulse 88  Temp(Src) 100.5 F (38.1 C) (Oral)  SpO2 98%  She appears well, vital signs are as noted. Ears normal.  Throat and pharynx normal.  Neck supple. No adenopathy in the neck. Nose is congested. Sinuses non tender. The chest is clear, without wheezes or rales.  ASSESSMENT:  bronchitis  PLAN: Given duration and progression of symptoms, will treat for bacterial process. Symptomatic therapy suggested: push fluids, rest and return office visit prn if symptoms persist or worsen.  Call or return to clinic prn if these symptoms worsen or fail to improve as anticipated.

## 2011-04-10 ENCOUNTER — Telehealth: Payer: Self-pay | Admitting: Family Medicine

## 2011-04-10 NOTE — Telephone Encounter (Signed)
If she finished 4 out of 5 days of zpak - I feel comfortable with that If worse or fever (check temp)- follow up  If not improving more towards end of week f/u

## 2011-04-10 NOTE — Telephone Encounter (Signed)
Left vm for pt to callback 

## 2011-04-10 NOTE — Telephone Encounter (Signed)
Triage Record Num: [id number] Operator: Arline Asp Loftin Patient Name: [name] Call Date & Time: 04/10/2011 12:44:42PM Patient Phone: [phone] PCP: Idamae Schuller A. Tower Patient Gender: Female PCP Fax : Patient DOB: Jan 10, 1941 Practice Name: [facility] Reason for Call: Caller: [name]; PCP: Roxy Manns A.; CB#: [phone]; ; ; Call regarding "Would Like To Know How Long A. Cough s Germs Stay in the Air After You Cough Without Using A. Tissue and DO You Stop Taking Abx When You Feel Better?;" cough onset 01/18. Seen in office 01/24 for URI. Has been taking cough medicine with codeine, and Zpack for 4 days, then lost the pack. States her "cough is better." Phlegm is brownish green. Unsure of fever, but has been feeling chills and then hot (thermometer is broken). Mentioned she has "fever blisters under her nose" onset 01/28. Emergent sx negative. Care advice per "Cough, Adult" with disp to see provider within 24h due to "productive cough with colored sputum." Caller does not want appt at this time, requests to see if she needs another abx. OFFICE PLEASE CALL [name] AT [phone] AND ADVISE IF ANOTHER ABX HAS BEEN CALLED IN OR IF SHE MUST BE SEEN. [facility]. Protocol(s) Used: Cough - Adult Recommended Outcome per Protocol: See Provider within 24 hours Reason for Outcome: Productive cough with colored sputum (other than clear or white sputum) Care Advice: ~ Use a cool mist humidifier to moisten air. Be sure to clean according to manufacturer's instructions. Call provider if fever greater than 101.5 F (38.6 C) or 100.5 F (38.1C) in an immunocompromised patient (such as diabetes, HIV/AIDS, renal disease, chemotherapy, organ transplant, or chronic steroid use) has not improved in 24 hours. ~ Increase fluids to 8-12 eight oz (1.6 to 2.4 liters) glasses per day, half of them to be water. Soups, popsicles, fruit juices, non-caffeinated sodas (unless restricting  sodium intake), jello, broths, decaf teas, etc. are all okay. Warm fluids can be soothing. ~ Coughing up mucus or phlegm helps to get rid of an infection. A productive cough should not be stopped. A cough medicine with guaifenesin (Robitussin, Mucinex) can help loosen the mucus. Cough medicine with dextromethorphan (DM) should be avoided. Drinking lots of fluids can help loosen the mucus too, especially warm fluids. ~ 04/10/2011 1:04:14PM Page 1 of 1 [email or web address]

## 2011-04-11 ENCOUNTER — Encounter: Payer: Self-pay | Admitting: Family Medicine

## 2011-04-11 ENCOUNTER — Telehealth: Payer: Self-pay | Admitting: Family Medicine

## 2011-04-11 ENCOUNTER — Ambulatory Visit (INDEPENDENT_AMBULATORY_CARE_PROVIDER_SITE_OTHER): Payer: MEDICARE | Admitting: Family Medicine

## 2011-04-11 VITALS — BP 132/58 | HR 88 | Temp 98.8°F

## 2011-04-11 DIAGNOSIS — J209 Acute bronchitis, unspecified: Secondary | ICD-10-CM

## 2011-04-11 NOTE — Telephone Encounter (Signed)
Patient was seen last Thursday for Bronchitis.  Patient feel like it has moved into her chest and lungs.  Patient was prescribed an antibiotic,but she lost it.  The last time she took the antibiotic was on Sunday.  She uses [facility], community in [place].  Patient asked if she could be seen by you today.

## 2011-04-11 NOTE — Progress Notes (Signed)
Subjective:    Patient ID: [name], female    DOB: [birth year], 71 y.o.   MRN: [id number]  HPI Saw Dr Dayton Martes on 1/24 and given zpak for bronchitis  Took all 5 days of it  Symptoms that are very slightly improved but not as expected  - coughing more , but phlegm is more loose  Is having more pain in her chest - soreness radiating to the back (soaking 2-3 hot baths per day)  Is uncomfortable to take a deep breath  Yellow mucous  Took some mucinex - and that helped a bit  Was given tussionex for cough - made her sleepy - did not like how she felt with it but it helped her sleep   Days and nights are all mixed up- is quite tired   Overall sick since before the 18th   Her friend had pneumonia - and she did take care of her - she is totally better now   Patient Active Problem List  Diagnoses  . HYPERLIPIDEMIA  . HYPOKALEMIA, MILD  . UNSPECIFIED ANEMIA  . PERSONALITY DISORDER  . TREMOR, ESSENTIAL  . BLEEDING, POSTMENOPAUSAL  . OSTEOARTHRITIS  . TRANSAMINASES, SERUM, ELEVATED  . ELEVATED BLOOD PRESSURE WITHOUT DIAGNOSIS OF HYPERTENSION  . INTESTINAL OBSTRUCTION, HX OF  . Bronchitis, acute   Past Medical History  Diagnosis Date  . Hyperlipidemia   . Osteoarthritis     knee  . History of vaginal bleeding     post menopausal- gyn eval, ? polyp  . Colon polyp   . Schizoaffective disorder     paranoid personality- refuses treatment  . History of small bowel obstruction 9/09    likely due to adhesions- pt refused most dx and tx in hospital   Past Surgical History  Procedure Date  . Tonsillectomy   . Knee arthroscopy     right  . Laparoscopy 1970's    twisted fallopian tube  . Appendectomy   . Bladder tack 1976   History  Substance Use Topics  . Smoking status: Former Games developer  . Smokeless tobacco: Not on file  . Alcohol Use: Not on file   Family History  Problem Relation Age of Onset  . Heart disease Mother   . Diabetes Mother   . Diabetes Father   .  Heart disease Father   . Hypertension Father    Allergies  Allergen Reactions  . Penicillins     REACTION: rash/hives  . Sulfonamide Derivatives     REACTION: rash   Current Outpatient Prescriptions on File Prior to Visit  Medication Sig Dispense Refill  . b complex vitamins tablet Take 1 tablet by mouth daily.        . chlorpheniramine-HYDROcodone (TUSSIONEX PENNKINETIC ER) 10-8 MG/5ML LQCR Take 5 mLs by mouth every 12 (twelve) hours as needed.  140 mL  0  . vitamin C (ASCORBIC ACID) 500 MG tablet Take 500 mg by mouth daily.        Marland Kitchen VITAMIN E PO Take by mouth daily             Review of Systems Review of Systems  Constitutional: Negative for fever, appetite change,  and unexpected weight change. pps for fatigue  Eyes: Negative for pain and visual disturbance.  Respiratory: Negative for sob or wheeze  Cardiovascular: Negative for cp or palpitations    Gastrointestinal: Negative for nausea, diarrhea and constipation.  Genitourinary: Negative for urgency and frequency.  Skin: Negative for pallor or rash   Neurological:  Negative for weakness, light-headedness, numbness and headaches.  Hematological: Negative for adenopathy. Does not bruise/bleed easily.  Psychiatric/Behavioral: Negative for dysphoric mood. The patient is not nervous/anxious.          Objective:   Physical Exam  Constitutional: She appears well-developed and well-nourished. No distress.       Mildly fatigued / pleasant  HENT:  Head: Normocephalic and atraumatic.  Right Ear: External ear normal.  Left Ear: External ear normal.  Mouth/Throat: Oropharynx is clear and moist.       Nares are injected and congested   Mild post throat injection with post nasal drip  Eyes: Conjunctivae and EOM are normal. Pupils are equal, round, and reactive to light. Right eye exhibits no discharge. Left eye exhibits no discharge.  Neck: Normal range of motion. Neck supple. No thyromegaly present.  Cardiovascular: Normal  rate, regular rhythm and normal heart sounds.   Pulmonary/Chest: Effort normal and breath sounds normal. No respiratory distress. She has no wheezes. She has no rales. She exhibits tenderness.       Harsh bs throughout with hacking cough No rales/ rhonchi or wheeze Good air exch  Mildly tender chest wall ant and post   Abdominal: Soft. Bowel sounds are normal. There is no tenderness.  Lymphadenopathy:    She has no cervical adenopathy.  Neurological: She is alert.  Skin: Skin is warm and dry. No rash noted.  Psychiatric:       Baseline affect - tends to act slightly paranoid Pleasant, however           Assessment & Plan:

## 2011-04-11 NOTE — Assessment & Plan Note (Signed)
Reassuring exam and very slowly improving from last visit Finished zpak -- and do not feel like she needs further abx  No wheeze or reactive airways Disc symptomatic care - see instructions on AVS  .upate

## 2011-04-11 NOTE — Telephone Encounter (Signed)
I notified patient.  She scheduled appointment today @ 2:15.

## 2011-04-11 NOTE — Telephone Encounter (Signed)
Can open one of my blocked slots today, no problem

## 2011-04-11 NOTE — Patient Instructions (Signed)
I think your bronchitis is gradually getting better  Keep resting - and drink lots of fluids Salt water gargles are helpful for sore throat  Continue the cough medicine as needed for cough (it will sedate you ) Update if not starting to improve in a week or if worsening  -- especially if fever / worse cough or shortness of breath  mucinex if fine if it helps loosen cough

## 2011-04-11 NOTE — Telephone Encounter (Signed)
Pt is here now to see Dr Milinda Antis.

## 2011-04-24 ENCOUNTER — Encounter: Payer: Self-pay | Admitting: Family Medicine

## 2011-04-24 ENCOUNTER — Ambulatory Visit (INDEPENDENT_AMBULATORY_CARE_PROVIDER_SITE_OTHER): Payer: MEDICARE | Admitting: Family Medicine

## 2011-04-24 DIAGNOSIS — R05 Cough: Secondary | ICD-10-CM

## 2011-04-24 DIAGNOSIS — R059 Cough, unspecified: Secondary | ICD-10-CM

## 2011-04-24 MED ORDER — BENZONATATE 200 MG PO CAPS
200.0000 mg | ORAL_CAPSULE | Freq: Three times a day (TID) | ORAL | Status: AC | PRN
Start: 2011-04-24 — End: 2011-05-01

## 2011-04-24 NOTE — Patient Instructions (Signed)
I would get some rest, drink plenty of fluids and take tessalon for the cough.  This should gradually get better.  Take care.

## 2011-04-24 NOTE — Progress Notes (Signed)
Cough continues, slowly improved, dry now. Was seen 04/11/11, completed zpack.  She is some better but getting better slowly.  Still fatigued; sleep has been totally disrupted.   No fevers now.  No wheeze.  No h/o asthma.  Doesn't smoke.    Meds, vitals, and allergies reviewed.   ROS: See HPI.  Otherwise, noncontributory.  nad ncat Tm wnl Nasal and op exam w/o acute changes Neck supple rrr Ctab, no wheeze or rales Ext w/o edema

## 2011-04-26 ENCOUNTER — Encounter: Payer: Self-pay | Admitting: Family Medicine

## 2011-04-26 DIAGNOSIS — R059 Cough, unspecified: Secondary | ICD-10-CM | POA: Insufficient documentation

## 2011-04-26 DIAGNOSIS — R05 Cough: Secondary | ICD-10-CM | POA: Insufficient documentation

## 2011-04-26 NOTE — Assessment & Plan Note (Signed)
Likely postinfectious cough that should continue to improve.  D/w pt in detail. This has been prevalent in community.  No other abx/imaging indicated. >25 min spent with face to face with patient, >50% counseling and/or coordinating care

## 2011-06-06 ENCOUNTER — Other Ambulatory Visit (INDEPENDENT_AMBULATORY_CARE_PROVIDER_SITE_OTHER): Payer: Medicare Other

## 2011-06-06 DIAGNOSIS — D649 Anemia, unspecified: Secondary | ICD-10-CM

## 2011-06-06 DIAGNOSIS — R030 Elevated blood-pressure reading, without diagnosis of hypertension: Secondary | ICD-10-CM

## 2011-06-06 DIAGNOSIS — R7401 Elevation of levels of liver transaminase levels: Secondary | ICD-10-CM

## 2011-06-06 DIAGNOSIS — E785 Hyperlipidemia, unspecified: Secondary | ICD-10-CM

## 2011-06-06 DIAGNOSIS — E876 Hypokalemia: Secondary | ICD-10-CM

## 2011-06-06 LAB — COMPREHENSIVE METABOLIC PANEL
ALT: 41 U/L — ABNORMAL HIGH (ref 0–35)
AST: 28 U/L (ref 0–37)
Albumin: 4.2 g/dL (ref 3.5–5.2)
Calcium: 9.5 mg/dL (ref 8.4–10.5)
Chloride: 102 mEq/L (ref 96–112)
Potassium: 4.3 mEq/L (ref 3.5–5.1)

## 2011-06-06 LAB — LIPID PANEL: HDL: 85.8 mg/dL (ref >39.00)

## 2011-06-06 LAB — CBC WITH DIFFERENTIAL/PLATELET
Basophils Absolute: 0 10*3/uL (ref 0.0–0.1)
Eosinophils Absolute: 0.1 10*3/uL (ref 0.0–0.7)
Hemoglobin: 12.2 g/dL (ref 12.0–15.0)
Lymphocytes Relative: 29.7 % (ref 12.0–46.0)
MCHC: 33.8 g/dL (ref 30.0–36.0)
MCV: 90.5 fl (ref 78.0–100.0)
Monocytes Absolute: 0.5 10*3/uL (ref 0.1–1.0)
Neutro Abs: 3.5 10*3/uL (ref 1.4–7.7)
Neutrophils Relative %: 60.3 % (ref 43.0–77.0)
RDW: 14.5 % (ref 11.5–14.6)

## 2011-06-06 LAB — LDL CHOLESTEROL, DIRECT: Direct LDL: 149.1 mg/dL

## 2011-06-12 ENCOUNTER — Encounter: Payer: Self-pay | Admitting: *Deleted

## 2011-06-20 ENCOUNTER — Encounter: Payer: Self-pay | Admitting: Family Medicine

## 2011-06-20 ENCOUNTER — Ambulatory Visit (INDEPENDENT_AMBULATORY_CARE_PROVIDER_SITE_OTHER): Payer: Medicare Other | Admitting: Family Medicine

## 2011-06-20 ENCOUNTER — Other Ambulatory Visit (HOSPITAL_COMMUNITY)
Admission: RE | Admit: 2011-06-20 | Discharge: 2011-06-20 | Disposition: A | Discharge status: Home / Self Care | Payer: Medicare Other | Source: Ambulatory Visit | Attending: Family Medicine | Admitting: Family Medicine

## 2011-06-20 VITALS — BP 140/80 | HR 61 | Temp 97.8°F | Ht 65.5 in | Wt 154.2 lb

## 2011-06-20 DIAGNOSIS — N814 Uterovaginal prolapse, unspecified: Secondary | ICD-10-CM

## 2011-06-20 DIAGNOSIS — R7401 Elevation of levels of liver transaminase levels: Secondary | ICD-10-CM

## 2011-06-20 DIAGNOSIS — Z01419 Encounter for gynecological examination (general) (routine) without abnormal findings: Secondary | ICD-10-CM | POA: Insufficient documentation

## 2011-06-20 DIAGNOSIS — Z7189 Other specified counseling: Secondary | ICD-10-CM | POA: Insufficient documentation

## 2011-06-20 DIAGNOSIS — E785 Hyperlipidemia, unspecified: Secondary | ICD-10-CM

## 2011-06-20 DIAGNOSIS — N950 Postmenopausal bleeding: Secondary | ICD-10-CM

## 2011-06-20 DIAGNOSIS — E876 Hypokalemia: Secondary | ICD-10-CM

## 2011-06-20 DIAGNOSIS — G589 Mononeuropathy, unspecified: Secondary | ICD-10-CM

## 2011-06-20 DIAGNOSIS — G629 Polyneuropathy, unspecified: Secondary | ICD-10-CM | POA: Insufficient documentation

## 2011-06-20 DIAGNOSIS — Z1159 Encounter for screening for other viral diseases: Secondary | ICD-10-CM | POA: Insufficient documentation

## 2011-06-20 LAB — HM PAP SMEAR: HM Pap smear: NORMAL

## 2011-06-20 NOTE — Assessment & Plan Note (Signed)
Remarkable on exam but no symptoms  Pt declines further work up or tx  Pap was done  ? Suspect unrelated to bleeding

## 2011-06-20 NOTE — Progress Notes (Signed)
Subjective:    Patient ID: [name], female    DOB: [birth year], 71 y.o.   MRN: [id number]  HPI Here for check up of chronic medical conditions and to review health mt list   Has been feeling some numbness in her feet   Did have one episode of vaginal bleeding  She refuses ultrasound or work up of this Does want internal exam and pap today  No hx of abn paps  No cramping or pain    Wt is up 11 lb with bmi of 25  Eating more and more hungry lately   bp 140/80  Alt slt elevated - has been in past  Not an alcohol  No tylenol     Lab Results  Component Value Date   CHOL 237* 06/06/2011   CHOL 222* 06/18/2009   CHOL 185 12/11/2007   Lab Results  Component Value Date   HDL 85.80 06/06/2011   HDL 40.98 06/18/2009   HDL 45.5 12/11/2007   Lab Results  Component Value Date   LDLCALC 121* 12/11/2007   Lab Results  Component Value Date   TRIG 66.0 06/06/2011   TRIG 44.0 06/18/2009   TRIG 94 12/11/2007   Lab Results  Component Value Date   CHOLHDL 3 06/06/2011   CHOLHDL 2 06/18/2009   CHOLHDL 4.1 CALC 12/11/2007   Lab Results  Component Value Date   LDLDIRECT 149.1 06/06/2011   LDLDIRECT 118.7 06/18/2009   chol is up LDL , but good HDL Has not been eating healthy foods lately , thinks she can change that   Tdap-- declines  Zoster vax-- declines  Pneumovax-- declines Flu shot- declines   Nl dexa 01 No hx of fractures or height change  Does take ca and vit D   Last gyn exam --?   colonosc 5/09 after [facility]- had polyp   mammo 05- declines  But does want breast exam today  Self exam - does not want to do   Patient Active Problem List  Diagnoses  . HYPERLIPIDEMIA  . HYPOKALEMIA, MILD  . UNSPECIFIED ANEMIA  . PERSONALITY DISORDER  . TREMOR, ESSENTIAL  . BLEEDING, POSTMENOPAUSAL  . OSTEOARTHRITIS  . TRANSAMINASES, SERUM, ELEVATED  . ELEVATED BLOOD PRESSURE WITHOUT DIAGNOSIS OF HYPERTENSION  . INTESTINAL OBSTRUCTION, HX OF  . Cough  . Gynecological  examination  . Neuropathy  . Uterine prolapse   Past Medical History  Diagnosis Date  . Hyperlipidemia   . Osteoarthritis     knee  . History of vaginal bleeding     post menopausal- gyn eval, ? polyp  . Colon polyp   . Schizoaffective disorder     paranoid personality- refuses treatment  . History of small bowel obstruction 9/09    likely due to adhesions- pt refused most dx and tx in hospital   Past Surgical History  Procedure Date  . Tonsillectomy   . Knee arthroscopy     right  . Laparoscopy 1970's    twisted fallopian tube  . Appendectomy   . Bladder tack 1976   History  Substance Use Topics  . Smoking status: Former Games developer  . Smokeless tobacco: Not on file  . Alcohol Use: Not on file   Family History  Problem Relation Age of Onset  . Heart disease Mother   . Diabetes Mother   . Diabetes Father   . Heart disease Father   . Hypertension Father    Allergies  Allergen Reactions  . Penicillins  REACTION: rash/hives  . Sulfonamide Derivatives     REACTION: rash   Current Outpatient Prescriptions on File Prior to Visit  Medication Sig Dispense Refill  . vitamin C (ASCORBIC ACID) 500 MG tablet Take 500 mg by mouth daily.        Marland Kitchen zinc gluconate 50 MG tablet Take 50 mg by mouth daily.             Review of Systems Review of Systems  Constitutional: Negative for fever, appetite change, fatigue and unexpected weight change.  Eyes: Negative for pain and visual disturbance.  Respiratory: Negative for cough and shortness of breath.   Cardiovascular: Negative for cp or palpitations    Gastrointestinal: Negative for nausea, diarrhea and constipation.  Genitourinary: Negative for urgency and frequency. pos for vaginal bleeding ,neg for discharge or discomfort  Skin: Negative for pallor or rash   Neurological: Negative for weakness, light-headedness, numbness and headaches.  Hematological: Negative for adenopathy. Does not bruise/bleed easily.    Psychiatric/Behavioral: Negative for dysphoric mood. The patient is not nervous/anxious.         Objective:   Physical Exam  Constitutional: She appears well-developed and well-nourished. No distress.  HENT:  Head: Normocephalic and atraumatic.  Right Ear: External ear normal.  Left Ear: External ear normal.  Nose: Nose normal.  Mouth/Throat: Oropharynx is clear and moist.  Eyes: Conjunctivae and EOM are normal. Pupils are equal, round, and reactive to light. No scleral icterus.  Neck: Normal range of motion. Neck supple. No JVD present. Carotid bruit is not present. No thyromegaly present.  Cardiovascular: Normal rate, regular rhythm, normal heart sounds and intact distal pulses.  Exam reveals no gallop.   Pulmonary/Chest: Effort normal and breath sounds normal. No respiratory distress. She has no wheezes.  Abdominal: Soft. Bowel sounds are normal. She exhibits no distension, no abdominal bruit and no mass. There is no tenderness.  Genitourinary: Rectum normal. No breast swelling, tenderness, discharge or bleeding. There is no rash, tenderness, lesion or injury on the right labia. There is no rash, tenderness, lesion or injury on the left labia. Cervix exhibits no friability. Right adnexum displays no mass, no tenderness and no fullness. Left adnexum displays no mass and no fullness. No erythema or bleeding around the vagina. No vaginal discharge found.       Breast exam: No mass, nodules, thickening, tenderness, bulging, retraction, inflamation, nipple discharge or skin changes noted.  No axillary or clavicular LA.  Chaperoned exam.   o External genitalia - nl appearing o Urethral meatus- nl appearing  o Urethra  -nl appearing Tildon Husky o Bladder moderate cystocele with pelvic prolapse o Vagina - nl mucosa  o Cervix  - full prolapse but nl app o Uterus  Full prolapse but nl app o Adnexa/parametria no M or tenderness o Anus and perineum-nl appearing    Musculoskeletal: Normal range of  motion. She exhibits no edema.  Lymphadenopathy:    She has no cervical adenopathy.  Neurological: She is alert. She has normal reflexes. No cranial nerve deficit. She exhibits normal muscle tone. Coordination normal.  Skin: Skin is warm and dry. No rash noted. No erythema. No pallor.  Psychiatric:       Baseline affect with slow speech/ general feeling of dissatisfaction and suspiciousness            Assessment & Plan:

## 2011-06-20 NOTE — Assessment & Plan Note (Signed)
Exam done Uterine prolapse- asymptomatic Pap done at pt req Nl breast exam She declines mammograms  No expl. For post menop bleeding and pt declines further work up  I did make it clear that endometrial cancer could be the cause- but she still declines ultrasound or any further work up

## 2011-06-20 NOTE — Assessment & Plan Note (Signed)
Per symptoms - sounds like early neuropathy in toes No DM noted  Nl tsh  Would check B12- pt choose to start some otc and see if symptoms imp-- if not .Marland Kitchen Consider checking level

## 2011-06-20 NOTE — Assessment & Plan Note (Signed)
Mild, ? Etiology  Alt in 40s Continue to follow Asymptomatic No tylenol or alcohol

## 2011-06-20 NOTE — Assessment & Plan Note (Signed)
Normal today - rev labs

## 2011-06-20 NOTE — Assessment & Plan Note (Signed)
This has re occurred  Pt declines pelvic US or further work up  Has uterine prolapse which does not bother her  Had pap and exam today  Enc her to call if she changes her mind about further w/u of bleeding - I feel this is important

## 2011-06-20 NOTE — Assessment & Plan Note (Signed)
This is worse with less careful diet  Disc goals for lipids and reasons to control them Rev labs with pt Rev low sat fat diet in detail  Will work on that

## 2011-06-20 NOTE — Patient Instructions (Addendum)
For cholesterol Avoid red meat/ fried foods/ egg yolks/ fatty breakfast meats/ butter, cheese and high fat dairy/ and shellfish   Try to get 1200-1500 mg of calcium per day with at least 1000 iu of vitamin D - for bone health  Pap and gyn exam done today  Breast exam done today  Try to eat a healthy diet and get enough exercise Due to the numbness in your toes - try 500 mcg of vitamin B12 over the counter daily and see if this helps - if not we could go ahead and check a B12 level  You have a pelvic prolapse (your uterus and cervix have fallen )- if this begins to bother you or give you pain please let me know  I do not think that your bleeding is related to that -- if it re occurs please update me - we would consider doing an ultrasound to check the uterine lining size (let me know if you change your mind about this)

## 2011-06-27 ENCOUNTER — Encounter: Payer: Self-pay | Admitting: *Deleted

## 2011-06-27 ENCOUNTER — Encounter: Payer: Self-pay | Admitting: Family Medicine

## 2011-10-30 ENCOUNTER — Encounter: Payer: Self-pay | Admitting: Family Medicine

## 2011-10-30 ENCOUNTER — Encounter: Payer: Self-pay | Admitting: Obstetrics & Gynecology

## 2011-10-30 ENCOUNTER — Ambulatory Visit (INDEPENDENT_AMBULATORY_CARE_PROVIDER_SITE_OTHER): Payer: Medicare Other | Admitting: Family Medicine

## 2011-10-30 ENCOUNTER — Other Ambulatory Visit (HOSPITAL_COMMUNITY)
Admission: RE | Admit: 2011-10-30 | Discharge: 2011-10-30 | Disposition: A | Discharge status: Home / Self Care | Payer: Medicare Other | Source: Ambulatory Visit | Attending: Obstetrics & Gynecology | Admitting: Obstetrics & Gynecology

## 2011-10-30 ENCOUNTER — Ambulatory Visit (INDEPENDENT_AMBULATORY_CARE_PROVIDER_SITE_OTHER): Payer: Medicare Other | Admitting: Obstetrics & Gynecology

## 2011-10-30 VITALS — BP 130/72 | HR 62 | Temp 98.2°F | Ht 65.0 in | Wt 153.2 lb

## 2011-10-30 VITALS — BP 124/57 | HR 70 | Ht 64.0 in | Wt 151.0 lb

## 2011-10-30 DIAGNOSIS — Z124 Encounter for screening for malignant neoplasm of cervix: Secondary | ICD-10-CM | POA: Insufficient documentation

## 2011-10-30 DIAGNOSIS — N950 Postmenopausal bleeding: Secondary | ICD-10-CM

## 2011-10-30 DIAGNOSIS — N814 Uterovaginal prolapse, unspecified: Secondary | ICD-10-CM

## 2011-10-30 DIAGNOSIS — S8000XA Contusion of unspecified knee, initial encounter: Secondary | ICD-10-CM | POA: Insufficient documentation

## 2011-10-30 DIAGNOSIS — N889 Noninflammatory disorder of cervix uteri, unspecified: Secondary | ICD-10-CM

## 2011-10-30 DIAGNOSIS — N813 Complete uterovaginal prolapse: Secondary | ICD-10-CM

## 2011-10-30 NOTE — Patient Instructions (Addendum)
For knees- take it easy for a few days and elevate legs whenever you can - using cold compress for 10 minutes off and on whenever you can Tylenol 1-2 pills up to every 4 hours is ok for pain We will do a gyn referral at check out

## 2011-10-30 NOTE — Progress Notes (Signed)
  Subjective:    Patient ID: [name], female    DOB: 03/16/1940, 71 y.o.   MRN: [id number]  HPI  71 yo P3 who is here with a 10 year h/o uterine prolapse, worsening significantly over the last month. She feels "pressure" and discomfort when she sits on hard surfaces. She denies and unusual bowel or bladder issues. She also complains of a smelly vaginal discharge. She says no IC since 2000. As I was leaving the room, she tells me that she has had intermittent vaginal bleeding since winter of this year. She reported it to Dr. Milinda Antis in April. She tells me that she had a normal pap last year.  Review of Systems     Objective:   Physical Exam Complete procedentia Her cervix has unusual white plaques at the 3 and 9 o'clock positions. I biopsied the one at the 3 o'clock position (silver nitrate yielded silver nitrate) No adnexal masses on exam Her uterus is about 6 week size. No adnexal masses felt       Assessment & Plan:  1) PMB- I will get an u/s and then a EMBX 2) Complete procedentia- I have discussed surgery versus pessary. I gave her written information 3) vaginal discharge- normal on exam, but I have sent a wet prep 4) cervical abnormality- I will await the biopsy and I sent a pap.

## 2011-10-30 NOTE — Addendum Note (Signed)
Addended by: Vinnie Langton C on: 10/30/2011 03:40 PM   Modules accepted: Orders

## 2011-10-30 NOTE — Assessment & Plan Note (Signed)
Will ref to gyn as this continues - along with worse prolapse

## 2011-10-30 NOTE — Assessment & Plan Note (Signed)
Both knees after a fall on hard floor today  Reassuring exam- tender patella on L with some early swelling Disc what to expect- swelling/ bruising Plan made to use cold compress and elevate and update if worse or not improving within the week

## 2011-10-30 NOTE — Progress Notes (Signed)
Subjective:    Patient ID: [name], female    DOB: 17-Jul-1940, 71 y.o.   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 HYPERLIPIDEMIA  . HYPOKALEMIA, MILD  . UNSPECIFIED ANEMIA  . PERSONALITY DISORDER  . TREMOR, ESSENTIAL  . BLEEDING, POSTMENOPAUSAL  . OSTEOARTHRITIS  . TRANSAMINASES, SERUM, ELEVATED  . ELEVATED BLOOD PRESSURE WITHOUT DIAGNOSIS OF HYPERTENSION  . INTESTINAL OBSTRUCTION, HX OF  . Cough  . Gynecological examination  . Neuropathy  . Uterine prolapse   Past Medical History  Diagnosis Date  . Hyperlipidemia   . Osteoarthritis     knee  . History of vaginal bleeding     post menopausal- gyn eval, ? polyp  . Colon polyp   . Schizoaffective disorder     paranoid personality- refuses treatment  . History of small bowel obstruction 9/09    likely due to adhesions- pt refused most dx and tx in hospital   Past Surgical History  Procedure Date  . Tonsillectomy   . Knee arthroscopy     right  . Laparoscopy 1970's    twisted fallopian tube  . Appendectomy   . Bladder tack 1976   History  Substance Use Topics  . Smoking status: Former Games developer  . Smokeless tobacco: Not on file  . Alcohol Use: No   Family History  Problem Relation Age of Onset  . Heart disease Mother   . Diabetes Mother   . Diabetes Father   . Heart disease Father   . Hypertension Father    Allergies  Allergen Reactions  . Penicillins     REACTION: rash/hives  . Sulfonamide Derivatives    REACTION: rash   Current Outpatient Prescriptions on File Prior to Visit  Medication Sig Dispense Refill  . vitamin C (ASCORBIC ACID) 500 MG tablet Take 500 mg by mouth daily.        Marland Kitchen zinc gluconate 50 MG tablet Take 50 mg by mouth daily.          Review of Systems   Review of Systems  Constitutional: Negative for fever, appetite change, fatigue and unexpected weight change.  Eyes: Negative for pain and visual disturbance.  Respiratory: Negative for cough and shortness of breath.   Cardiovascular: Negative for cp or palpitations    Gastrointestinal: Negative for nausea, diarrhea and constipation.  Genitourinary: Negative for urgency and frequency. pos for vaginal bleeding and symptoms of prolapse Skin: Negative for pallor or rash  neg for itching  MSK pos for knee pain bilat with some swelling , neg for back pain  Neurological: Negative for weakness, light-headedness, numbness and headaches.  Hematological: Negative for adenopathy. Does not bruise/bleed easily.  Psychiatric/Behavioral: Negative for dysphoric mood. The patient is not nervous/anxious.      Objective:   Physical Exam  Constitutional: She appears well-developed and well-nourished. No distress.  HENT:  Head: Normocephalic and  atraumatic.  Mouth/Throat: Oropharynx is clear and moist.  Eyes: Conjunctivae and EOM are normal. Pupils are equal, round, and reactive to light. Right eye exhibits no discharge. Left eye exhibits no discharge.  Neck: Normal range of motion. Neck supple. No thyromegaly present.  Cardiovascular: Normal rate and regular rhythm.   Pulmonary/Chest: Effort normal and breath sounds normal.  Abdominal: Soft. Bowel sounds are normal. She exhibits no distension. There is no tenderness.       No suprapubic tenderness or fullness    Musculoskeletal: Normal range of motion. She exhibits edema and tenderness.       bilat knees- patella tenderness, slt swelling and early bruising Nl rom knees No  crepitice No skin interruption No joint line tenderness No instability  Lymphadenopathy:    She has no cervical adenopathy.  Neurological: She is alert. She has normal reflexes. She exhibits normal muscle tone. Coordination normal.  Skin: Skin is warm and dry. No erythema. No pallor.  Psychiatric:       basline affect- odd/ blunted, somewhat paranoid Seems to understand instructions - but not very trusting           Assessment & Plan:

## 2011-10-31 LAB — WET PREP, GENITAL
Trich, Wet Prep: NONE SEEN
Yeast Wet Prep HPF POC: NONE SEEN

## 2011-11-01 ENCOUNTER — Ambulatory Visit (HOSPITAL_COMMUNITY)
Admission: RE | Admit: 2011-11-01 | Discharge: 2011-11-01 | Disposition: A | Discharge status: Home / Self Care | Payer: Medicare Other | Source: Ambulatory Visit | Attending: Obstetrics & Gynecology | Admitting: Obstetrics & Gynecology

## 2011-11-01 DIAGNOSIS — D259 Leiomyoma of uterus, unspecified: Secondary | ICD-10-CM | POA: Insufficient documentation

## 2011-11-01 DIAGNOSIS — N950 Postmenopausal bleeding: Secondary | ICD-10-CM | POA: Insufficient documentation

## 2011-11-06 ENCOUNTER — Encounter: Payer: Self-pay | Admitting: Obstetrics and Gynecology

## 2011-11-06 ENCOUNTER — Ambulatory Visit (INDEPENDENT_AMBULATORY_CARE_PROVIDER_SITE_OTHER): Payer: Medicare Other | Admitting: Obstetrics and Gynecology

## 2011-11-06 VITALS — BP 136/61 | HR 64 | Ht 64.0 in | Wt 150.0 lb

## 2011-11-06 DIAGNOSIS — N950 Postmenopausal bleeding: Secondary | ICD-10-CM

## 2011-11-06 DIAGNOSIS — N814 Uterovaginal prolapse, unspecified: Secondary | ICD-10-CM

## 2011-11-06 NOTE — Progress Notes (Signed)
Patient ID: [name], female   DOB: October 13, 1940, 71 y.o.   MRN: [id number] 71 yo with postmenopausal vaginal bleeding presents today to discuss the results of the ultrasound and cervical biopsy performed at her last visit. Results of the cervical biopsy were reviewed and explained to the patient. Results of the ultrasound were also discussed and explained. It was explained to the patient that her endometrial lining was thicker than was expected for a menopausal woman. An endometrial biopsy was recommended to rule out endometrial carcinoma.   ENDOMETRIAL BIOPSY     The indications for endometrial biopsy were reviewed.   Risks of the biopsy including cramping, bleeding, infection, uterine perforation, inadequate specimen and need for additional procedures  were discussed. The patient states she understands and agrees to undergo procedure today. Consent was signed. Time out was performed. Urine HCG was negative. A sterile speculum was placed in the patient's vagina and the cervix was prepped with Betadine. A single-toothed tenaculum was placed on the anterior lip of the cervix to stabilize it. The uterine cavity was sounded to a depth of 9 cm using the uterine sound. The 3 mm pipelle was introduced into the endometrial cavity without difficulty, 2 passes were made.  A  scant amount of tissue was  sent to pathology. The instruments were removed from the patient's vagina. Minimal bleeding from the cervix was noted. The patient tolerated the procedure well.  Routine post-procedure instructions were given to the patient. The patient will follow up in two weeks to review the results and for further management.    Patient with uterine prolapse, requesting hysterectomy. Patient understands that the hysterectomy will be scheduled pending results of the biopsy

## 2011-11-06 NOTE — Progress Notes (Signed)
Patient is here for post menopausal bleeding and abnormal uterine thickening on ultrasound.

## 2011-11-14 ENCOUNTER — Encounter: Payer: Self-pay | Admitting: Obstetrics & Gynecology

## 2011-11-14 ENCOUNTER — Ambulatory Visit (INDEPENDENT_AMBULATORY_CARE_PROVIDER_SITE_OTHER): Payer: Medicare Other | Admitting: Obstetrics & Gynecology

## 2011-11-14 VITALS — BP 130/54 | HR 68 | Ht 65.0 in | Wt 149.0 lb

## 2011-11-14 DIAGNOSIS — N813 Complete uterovaginal prolapse: Secondary | ICD-10-CM

## 2011-11-14 DIAGNOSIS — N814 Uterovaginal prolapse, unspecified: Secondary | ICD-10-CM

## 2011-11-14 NOTE — Progress Notes (Signed)
  Subjective:    Patient ID: [name], female    DOB: [birth year], 71 y.o.   MRN: [id number]  HPI  [name] is here today because she would like to schedule surgery for her complete procedentia. Her cervical and uterine biopsies are back and are normal. She has a h/o bladder surgery and currently has bladder issues.  Review of Systems She is not sexually active    Objective:   Physical Exam        Assessment & Plan:  We had a long discussion about her treatment options. She is not interested in a pessary at this time. I have recommended a sacrocolpopexy (by [name]) and have given her [name] office number). I have also discussed a TVH/colpocleisis and a TVH with a large Anterior and Posterior repair. I have also told her that the vaginal approaches very well may worsen her bladder issues. I will send her to [facility] for a consult.

## 2011-11-20 ENCOUNTER — Ambulatory Visit: Payer: Medicare Other | Admitting: Obstetrics & Gynecology

## 2012-02-12 ENCOUNTER — Encounter: Payer: Self-pay | Admitting: Obstetrics & Gynecology

## 2012-02-12 ENCOUNTER — Ambulatory Visit (INDEPENDENT_AMBULATORY_CARE_PROVIDER_SITE_OTHER): Payer: Medicare Other | Admitting: Obstetrics & Gynecology

## 2012-02-12 VITALS — BP 159/68 | HR 63 | Ht 65.0 in | Wt 157.0 lb

## 2012-02-12 DIAGNOSIS — N814 Uterovaginal prolapse, unspecified: Secondary | ICD-10-CM

## 2012-02-12 NOTE — Progress Notes (Signed)
Here today for pre op exam and to schedule surgery.

## 2012-02-12 NOTE — Progress Notes (Signed)
  Subjective:    Patient ID: [name], female    DOB: 1940-05-30, 71 y.o.   MRN: [id number]  HPI  [name] is here today to schedule surgery. She has declined to see Dr. Seymour Bars but did get her consult with Dr. Sherron Monday who agrees to combine our surgeries.   Review of Systems     Objective:   Physical Exam        Assessment & Plan:  Complete procedentia. She declines a pessary and does want to schedule surgery. I will have [place] schedule her for a TVH/possible BSO/rectocele repair by me and a vaginal vault suspension/cystocele repair/cystoscopy by Dr. Sherron Monday.

## 2012-03-18 ENCOUNTER — Other Ambulatory Visit: Payer: Self-pay | Admitting: Urology

## 2012-04-15 ENCOUNTER — Encounter (HOSPITAL_COMMUNITY): Payer: Self-pay | Admitting: Pharmacist

## 2012-04-18 ENCOUNTER — Inpatient Hospital Stay (HOSPITAL_COMMUNITY): Admission: RE | Admit: 2012-04-18 | Payer: Medicare Other | Source: Ambulatory Visit

## 2012-04-19 ENCOUNTER — Encounter (HOSPITAL_COMMUNITY)
Admission: RE | Admit: 2012-04-19 | Discharge: 2012-04-19 | Disposition: A | Discharge status: Home / Self Care | Payer: Medicare Other | Source: Ambulatory Visit | Attending: Obstetrics & Gynecology | Admitting: Obstetrics & Gynecology

## 2012-04-19 ENCOUNTER — Encounter (HOSPITAL_COMMUNITY): Payer: Self-pay

## 2012-04-19 LAB — CBC
HCT: 36.3 % (ref 36.0–46.0)
Hemoglobin: 12.1 g/dL (ref 12.0–15.0)
MCH: 29.5 pg (ref 26.0–34.0)
MCHC: 33.3 g/dL (ref 30.0–36.0)
MCV: 88.5 fL (ref 78.0–100.0)
RDW: 12.8 % (ref 11.5–15.5)

## 2012-04-19 LAB — APTT: aPTT: 33 seconds (ref 24–37)

## 2012-04-19 NOTE — Patient Instructions (Addendum)
[address] Lenah Messenger  04/19/2012   Your procedure is scheduled on:  04/23/12  Enter through the Main Entrance of [facility] at 6 AM.  Pick up the phone at the desk and dial [phone].   Call this number if you have problems the morning of surgery: [phone]   Remember:   Do not eat food:After Midnight.  Do not drink clear liquids: After Midnight.  Take these medicines the morning of surgery with A SIP OF WATER: NA   Do not wear jewelry, make-up or nail polish.  Do not wear lotions, powders, or perfumes. You may wear deodorant.  Do not shave 48 hours prior to surgery.  Do not bring valuables to the hospital.  Contacts, dentures or bridgework may not be worn into surgery.  Leave suitcase in the car. After surgery it may be brought to your room.  For patients admitted to the hospital, checkout time is 11:00 AM the day of discharge.   Patients discharged the day of surgery will not be allowed to drive home.  Name and phone number of your driver: NA  Special Instructions: Shower using CHG 2 nights before surgery and the night before surgery.  If you shower the day of surgery use CHG.  Use special wash - you have one bottle of CHG for all showers.  You should use approximately 1/3 of the bottle for each shower.   Please read over the following fact sheets that you were given: MRSA Information

## 2012-04-22 NOTE — H&P (Signed)
History of Present Illness   [name] has prolapse. She denies urge and stress incontinence but wears 5 liners a day, moderately wet. She has foot-on-the-floor syndrome. She gets up 2-3 times a night. Her flow is a little bit slower. On pelvic examination, she had an enlarged uterus that exited the introitus approximately 3 cm. She lost significant length anteriorly and posteriorly with the cystocele and rectocele. With the uterus and apex reduced, she had a grade 2 cystocele and grade 2 rectocele. She emptied efficiently. She was here to discuss her urodynamics. I thought if she had the surgery she likely would benefit from a transvaginal hysterectomy and vault suspension and cystocele repair and graft and posterior repair.   There was a question whether or not Dr. Seymour Bars could perform a sacrocolpopexy robotically. I wanted a CT urogram to rule out silent hydronephrosis. She had some likely ovarian cyst and no hydronephrosis.   She was here to discuss her urodynamics.   Review of Systems: No other change in bowel or neurologic systems.   She did not void and was catheterized for 200 mL. Maximum capacity was 800 mL. Her bladder was unstable reaching pressure of 11 cm of water. She had mild leakage. Importantly, she did not leak with a Valsalva pressure of 117 cm of water with or without the prolapse reduced. She had some Valsalva induced instability. During voluntary voiding, she voided 800 mL with a maximum flow of 42 mL/sec. Maximum voiding pressure was 19 cm of water. Contraction was mildly sustained. She voided better with the prolapse reduced. She has significant prolapse fluoroscopically. Bladder was mildly trabeculated. Her bladder was hyposensitive. The details of the urodynamics are signed and dictated on the urodynamic sheet.    Past Medical History Problems  1. History of  Anxiety (Symptom) 300.00 2. History of  Arthritis V13.4 3. History of  Hypercholesterolemia 272.0 4. History of   Hypothyroidism 244.9  Surgical History Problems  1. History of  Appendectomy 2. History of  Bladder Surgery 3. History of  Gynecologic Services 4. History of  Knee Surgery 5. History of  Tonsillectomy  Current Meds 1. Joint Formula TABS; Therapy: (Recorded:02Oct2013) to 2. Multi-Vitamin TABS; Therapy: (Recorded:02Oct2013) to 3. Sulfamethoxazole-TMP DS 800-160 MG Oral Tablet; TAKE ONE TABLET BY MOUTH TWICE  DAILY; Therapy: 04Oct2013 to (Last Rx:04Oct2013)  Requested for: 04Oct2013  Allergies Medication  1. No Known Drug Allergies  Family History Problems  1. Paternal history of  Chronic Renal Failure 2. Paternal history of  Death In The Family Father father passed @ age 71kidney failure 3. Maternal history of  Death In The Family Mother mother passed @ age 34kidney failure 4. Paternal history of  Diabetes Mellitus V18.0 5. Maternal history of  Diabetes Mellitus V18.0 6. Family history of  Family Health Status Number Of Children 1 son2 daughters  Social History Problems  1. Caffeine Use on occasion 2. Marital History - Widowed 3. Occupation: homemaker 4. History of  Tobacco Use 305.1 smoked 1/2 packed dailysmoked for 21 yearsquit smoking 33 years ago Denied  5. History of  Alcohol Use  Discussion/Summary   I drew Ms. Hughston a picture. I went over a transvaginal hysterectomy with cystocele repair and vault suspension with graft. I also discussed a rectocele repair. I do not think she should have a sling. Unmasking incontinence was also discussed. Pessary versus watchful waiting was also discussed.   I drew her a picture and we talked about prolapse surgery in detail. Pros, cons, general surgical  and anesthetic risks, and other options including behavioral therapy, pessaries, and watchful waiting were discussed. She understands that prolapse repairs are successful in 80-85% of cases for prolapse symptoms and can recur anteriorly, posteriorly, and/or apically. She  understands that in most cases I use a graft and general risks were discussed. Surgical risks were described but not limited to the discussion of injury to neighboring structures including the bowel (with possible life-threatening sepsis and colostomy), bladder, urethra, vagina (all resulting in further surgery), and ureter (resulting in re-implantation). We talked about injury to nerves/soft tissue leading to debilitating and intractable pelvic, abdominal, and lower extremity pain syndromes and neuropathies. The risks of buttock pain, intractable dyspareunia, and vaginal narrowing and shortening with sequelae were discussed. Bleeding risks, transfusion rates, and infection were discussed. The risk of persistent, de novo, or worsening bladder and/or bowel incontinence/dysfunction was discussed. The need for CIC was described as well the usual post-operative course. The patient understands that she might not reach her treatment goal and that she might be worse following surgery.  I briefly went over a robotic sacrocolpopexy as an option.   Ms. Chismar, after discussion, does not want to proceed with possible robotic procedure. We spent a fair amount of time on this.   She is going to call Dr. Marice Potter and likely proceed with surgery. I will leave it up between her and Dr. Marice Potter if they need to speak more about a hysterectomy. I drew her a picture and she was happy with our discussions.   I will await Myra's call and we can schedule things to get a room at the hospital moving forward.  After a thorough review of the management options for the patient's condition the patient  elected to proceed with surgical therapy as noted above. We have discussed the potential benefits and risks of the procedure, side effects of the proposed treatment, the likelihood of the patient achieving the goals of the procedure, and any potential problems that might occur during the procedure or recuperation. Informed consent has been  obtained.

## 2012-04-23 ENCOUNTER — Inpatient Hospital Stay (HOSPITAL_COMMUNITY): Payer: Medicare Other | Admitting: Anesthesiology

## 2012-04-23 ENCOUNTER — Encounter (HOSPITAL_COMMUNITY): Payer: Self-pay | Admitting: *Deleted

## 2012-04-23 ENCOUNTER — Observation Stay (HOSPITAL_COMMUNITY)
Admission: RE | Admit: 2012-04-23 | Discharge: 2012-04-24 | Disposition: A | Discharge status: Home / Self Care | Payer: Medicare Other | Source: Ambulatory Visit | Attending: Obstetrics & Gynecology | Admitting: Obstetrics & Gynecology

## 2012-04-23 ENCOUNTER — Encounter (HOSPITAL_COMMUNITY): Payer: Self-pay | Admitting: Anesthesiology

## 2012-04-23 ENCOUNTER — Encounter (HOSPITAL_COMMUNITY)
Admission: RE | Disposition: A | Discharge status: Home / Self Care | Payer: Self-pay | Source: Ambulatory Visit | Attending: Obstetrics & Gynecology

## 2012-04-23 DIAGNOSIS — N838 Other noninflammatory disorders of ovary, fallopian tube and broad ligament: Secondary | ICD-10-CM | POA: Insufficient documentation

## 2012-04-23 DIAGNOSIS — N813 Complete uterovaginal prolapse: Secondary | ICD-10-CM

## 2012-04-23 DIAGNOSIS — N840 Polyp of corpus uteri: Secondary | ICD-10-CM | POA: Insufficient documentation

## 2012-04-23 DIAGNOSIS — N83209 Unspecified ovarian cyst, unspecified side: Secondary | ICD-10-CM

## 2012-04-23 DIAGNOSIS — N950 Postmenopausal bleeding: Secondary | ICD-10-CM | POA: Insufficient documentation

## 2012-04-23 DIAGNOSIS — D259 Leiomyoma of uterus, unspecified: Secondary | ICD-10-CM | POA: Insufficient documentation

## 2012-04-23 DIAGNOSIS — N8000 Endometriosis of the uterus, unspecified: Secondary | ICD-10-CM | POA: Insufficient documentation

## 2012-04-23 HISTORY — PX: ANTERIOR AND POSTERIOR REPAIR: SHX5121

## 2012-04-23 HISTORY — PX: SALPINGOOPHORECTOMY: SHX82

## 2012-04-23 HISTORY — PX: VAGINAL PROLAPSE REPAIR: SHX830

## 2012-04-23 HISTORY — PX: VAGINAL HYSTERECTOMY: SHX2639

## 2012-04-23 LAB — TYPE AND SCREEN
ABO/RH(D): O POS
Antibody Screen: NEGATIVE

## 2012-04-23 SURGERY — HYSTERECTOMY, VAGINAL
Anesthesia: General | Site: Vagina | Wound class: Clean Contaminated

## 2012-04-23 MED ORDER — INDIGOTINDISULFONATE SODIUM 8 MG/ML IJ SOLN
INTRAMUSCULAR | Status: DC | PRN
  Administered 2012-04-23: 5 mL via INTRAVENOUS

## 2012-04-23 MED ORDER — FENTANYL CITRATE 0.05 MG/ML IJ SOLN
INTRAMUSCULAR | Status: AC
  Filled 2012-04-23: qty 5

## 2012-04-23 MED ORDER — INDIGOTINDISULFONATE SODIUM 8 MG/ML IJ SOLN
INTRAMUSCULAR | Status: AC
  Filled 2012-04-23: qty 5

## 2012-04-23 MED ORDER — SODIUM CHLORIDE 0.9 % IR SOLN
Freq: Once | Status: DC
  Filled 2012-04-23: qty 1

## 2012-04-23 MED ORDER — EPHEDRINE 5 MG/ML INJ
INTRAVENOUS | Status: AC
  Filled 2012-04-23: qty 10

## 2012-04-23 MED ORDER — LIDOCAINE HCL (CARDIAC) 20 MG/ML IV SOLN
INTRAVENOUS | Status: DC | PRN
  Administered 2012-04-23: 60 mg via INTRAVENOUS

## 2012-04-23 MED ORDER — FENTANYL CITRATE 0.05 MG/ML IJ SOLN
INTRAMUSCULAR | Status: DC | PRN
  Administered 2012-04-23 (×4): 50 ug via INTRAVENOUS
  Administered 2012-04-23: 100 ug via INTRAVENOUS
  Administered 2012-04-23: 50 ug via INTRAVENOUS

## 2012-04-23 MED ORDER — IBUPROFEN 600 MG PO TABS
600.0000 mg | ORAL_TABLET | Freq: Four times a day (QID) | ORAL | Status: DC | PRN
  Administered 2012-04-24: 600 mg via ORAL
  Filled 2012-04-23: qty 1

## 2012-04-23 MED ORDER — ONDANSETRON HCL 4 MG PO TABS: 4.0000 mg | ORAL_TABLET | Freq: Four times a day (QID) | ORAL | Status: DC | PRN

## 2012-04-23 MED ORDER — LIDOCAINE-EPINEPHRINE (PF) 1 %-1:200000 IJ SOLN
INTRAMUSCULAR | Status: DC | PRN
  Administered 2012-04-23: 27 mL

## 2012-04-23 MED ORDER — HYDROMORPHONE HCL PF 1 MG/ML IJ SOLN
0.2500 mg | INTRAMUSCULAR | Status: DC | PRN
  Administered 2012-04-23: 0.25 mg via INTRAVENOUS

## 2012-04-23 MED ORDER — ONDANSETRON HCL 4 MG/2ML IJ SOLN
INTRAMUSCULAR | Status: AC
  Filled 2012-04-23: qty 2

## 2012-04-23 MED ORDER — LACTATED RINGERS IV SOLN
INTRAVENOUS | Status: DC
  Administered 2012-04-23 (×4): via INTRAVENOUS
  Administered 2012-04-23: 125 mL/h via INTRAVENOUS

## 2012-04-23 MED ORDER — PROMETHAZINE HCL 25 MG/ML IJ SOLN: 6.2500 mg | INTRAMUSCULAR | Status: DC | PRN

## 2012-04-23 MED ORDER — LIDOCAINE-EPINEPHRINE (PF) 1 %-1:200000 IJ SOLN
INTRAMUSCULAR | Status: AC
  Filled 2012-04-23: qty 10

## 2012-04-23 MED ORDER — ESTRADIOL 0.1 MG/GM VA CREA
TOPICAL_CREAM | VAGINAL | Status: DC | PRN
  Administered 2012-04-23: 1 via VAGINAL

## 2012-04-23 MED ORDER — LIDOCAINE HCL (CARDIAC) 20 MG/ML IV SOLN
INTRAVENOUS | Status: AC
  Filled 2012-04-23: qty 5

## 2012-04-23 MED ORDER — SODIUM CHLORIDE 0.9 % IJ SOLN
INTRAMUSCULAR | Status: DC | PRN
  Administered 2012-04-23: 100 mL via INTRAVENOUS

## 2012-04-23 MED ORDER — LACTATED RINGERS IV SOLN
INTRAVENOUS | Status: DC
  Administered 2012-04-23 – 2012-04-24 (×2): via INTRAVENOUS

## 2012-04-23 MED ORDER — ONDANSETRON HCL 4 MG/2ML IJ SOLN: 4.0000 mg | Freq: Four times a day (QID) | INTRAMUSCULAR | Status: DC | PRN

## 2012-04-23 MED ORDER — GENTAMICIN SULFATE 40 MG/ML IJ SOLN
5.0000 mg/kg | INTRAMUSCULAR | Status: AC
  Administered 2012-04-23: 318 mg via INTRAVENOUS
  Filled 2012-04-23: qty 7.95

## 2012-04-23 MED ORDER — BUPIVACAINE-EPINEPHRINE 0.5% -1:200000 IJ SOLN
INTRAMUSCULAR | Status: DC | PRN
  Administered 2012-04-23: 30 mL

## 2012-04-23 MED ORDER — PROPOFOL 10 MG/ML IV EMUL
INTRAVENOUS | Status: DC | PRN
  Administered 2012-04-23: 150 mg via INTRAVENOUS

## 2012-04-23 MED ORDER — STERILE WATER FOR IRRIGATION IR SOLN
Status: DC | PRN
  Administered 2012-04-23: 1000 mL

## 2012-04-23 MED ORDER — ZOLPIDEM TARTRATE 5 MG PO TABS: 5.0000 mg | ORAL_TABLET | Freq: Every evening | ORAL | Status: DC | PRN

## 2012-04-23 MED ORDER — GENTAMICIN IN SALINE 1.6-0.9 MG/ML-% IV SOLN: 80.0000 mg | INTRAVENOUS | Status: DC

## 2012-04-23 MED ORDER — PROPOFOL 10 MG/ML IV EMUL
INTRAVENOUS | Status: AC
  Filled 2012-04-23: qty 20

## 2012-04-23 MED ORDER — MEPERIDINE HCL 25 MG/ML IJ SOLN: 6.2500 mg | INTRAMUSCULAR | Status: DC | PRN

## 2012-04-23 MED ORDER — CEFAZOLIN SODIUM-DEXTROSE 2-3 GM-% IV SOLR
INTRAVENOUS | Status: AC
  Filled 2012-04-23: qty 50

## 2012-04-23 MED ORDER — SODIUM CHLORIDE 0.9 % IR SOLN
Status: DC | PRN
  Administered 2012-04-23: 09:00:00

## 2012-04-23 MED ORDER — ROCURONIUM BROMIDE 50 MG/5ML IV SOLN
INTRAVENOUS | Status: AC
  Filled 2012-04-23: qty 1

## 2012-04-23 MED ORDER — DEXAMETHASONE SODIUM PHOSPHATE 4 MG/ML IJ SOLN
INTRAMUSCULAR | Status: DC | PRN
  Administered 2012-04-23: 4 mg via INTRAVENOUS

## 2012-04-23 MED ORDER — KETOROLAC TROMETHAMINE 30 MG/ML IJ SOLN: 15.0000 mg | Freq: Once | INTRAMUSCULAR | Status: DC | PRN

## 2012-04-23 MED ORDER — DEXAMETHASONE SODIUM PHOSPHATE 10 MG/ML IJ SOLN
INTRAMUSCULAR | Status: AC
  Filled 2012-04-23: qty 1

## 2012-04-23 MED ORDER — CEFAZOLIN SODIUM-DEXTROSE 2-3 GM-% IV SOLR
2.0000 g | INTRAVENOUS | Status: AC
  Administered 2012-04-23: 2 g via INTRAVENOUS

## 2012-04-23 MED ORDER — EPHEDRINE SULFATE 50 MG/ML IJ SOLN
INTRAMUSCULAR | Status: DC | PRN
  Administered 2012-04-23 (×5): 5 mg via INTRAVENOUS

## 2012-04-23 MED ORDER — MIDAZOLAM HCL 5 MG/5ML IJ SOLN
INTRAMUSCULAR | Status: DC | PRN
  Administered 2012-04-23: 1 mg via INTRAVENOUS

## 2012-04-23 MED ORDER — ESTRADIOL 0.1 MG/GM VA CREA
TOPICAL_CREAM | VAGINAL | Status: AC
  Filled 2012-04-23: qty 42.5

## 2012-04-23 MED ORDER — MIDAZOLAM HCL 2 MG/2ML IJ SOLN
INTRAMUSCULAR | Status: AC
  Filled 2012-04-23: qty 2

## 2012-04-23 MED ORDER — HYDROMORPHONE HCL PF 1 MG/ML IJ SOLN
INTRAMUSCULAR | Status: AC
  Filled 2012-04-23: qty 1

## 2012-04-23 MED ORDER — BUPIVACAINE HCL (PF) 0.5 % IJ SOLN
INTRAMUSCULAR | Status: AC
  Filled 2012-04-23: qty 30

## 2012-04-23 MED ORDER — FENTANYL CITRATE 0.05 MG/ML IJ SOLN
INTRAMUSCULAR | Status: AC
  Filled 2012-04-23: qty 2

## 2012-04-23 MED ORDER — ONDANSETRON HCL 4 MG/2ML IJ SOLN
INTRAMUSCULAR | Status: DC | PRN
  Administered 2012-04-23: 4 mg via INTRAVENOUS

## 2012-04-23 MED ORDER — OXYCODONE-ACETAMINOPHEN 5-325 MG PO TABS
1.0000 | ORAL_TABLET | ORAL | Status: DC | PRN
  Administered 2012-04-24: 1 via ORAL
  Filled 2012-04-23: qty 1

## 2012-04-23 MED ORDER — ROCURONIUM BROMIDE 100 MG/10ML IV SOLN
INTRAVENOUS | Status: DC | PRN
  Administered 2012-04-23: 45 mg via INTRAVENOUS
  Administered 2012-04-23: 5 mg via INTRAVENOUS

## 2012-04-23 SURGICAL SUPPLY — 61 items
BLADE SURG 10 STRL SS (BLADE) ×8 IMPLANT
BLADE SURG 15 STRL LF C SS BP (BLADE) ×3 IMPLANT
BLADE SURG 15 STRL SS (BLADE) ×1
CANISTER SUCTION 2500CC (MISCELLANEOUS) ×8 IMPLANT
CATH FOLEY 2WAY SLVR  5CC 16FR (CATHETERS) ×1
CATH FOLEY 2WAY SLVR 5CC 16FR (CATHETERS) ×3 IMPLANT
CATH ROBINSON RED A/P 16FR (CATHETERS) IMPLANT
CLOTH BEACON ORANGE TIMEOUT ST (SAFETY) ×4 IMPLANT
CONT PATH 16OZ SNAP LID 3702 (MISCELLANEOUS) ×4 IMPLANT
DECANTER SPIKE VIAL GLASS SM (MISCELLANEOUS) ×12 IMPLANT
DERMABOND ADVANCED (GAUZE/BANDAGES/DRESSINGS) ×1
DERMABOND ADVANCED .7 DNX12 (GAUZE/BANDAGES/DRESSINGS) ×3 IMPLANT
DEVICE CAPIO SLIM SINGLE (INSTRUMENTS) ×4 IMPLANT
DRAIN PENROSE 1/4X12 LTX (DRAIN) ×4 IMPLANT
DRAPE UNDERBUTTOCKS STRL (DRAPE) ×4 IMPLANT
DRESSING TELFA 8X3 (GAUZE/BANDAGES/DRESSINGS) ×4 IMPLANT
GAUZE PACKING 2X5 YD STERILE (GAUZE/BANDAGES/DRESSINGS) IMPLANT
GAUZE SPONGE 4X4 16PLY XRAY LF (GAUZE/BANDAGES/DRESSINGS) ×8 IMPLANT
GLOVE BIO SURGEON STRL SZ 6.5 (GLOVE) ×12 IMPLANT
GLOVE BIO SURGEON STRL SZ7.5 (GLOVE) ×8 IMPLANT
GLOVE BIO SURGEON STRL SZ8 (GLOVE) ×12 IMPLANT
GLOVE BIO SURGEON STRL SZ8.5 (GLOVE) ×12 IMPLANT
GLOVE BIOGEL PI IND STRL 6.5 (GLOVE) ×6 IMPLANT
GLOVE BIOGEL PI INDICATOR 6.5 (GLOVE) ×2
GOWN PREVENTION PLUS LG XLONG (DISPOSABLE) ×24 IMPLANT
GOWN STRL REIN XL XLG (GOWN DISPOSABLE) ×32 IMPLANT
NEEDLE HYPO 22GX1.5 SAFETY (NEEDLE) ×8 IMPLANT
NEEDLE MAYO .5 CIRCLE (NEEDLE) ×4 IMPLANT
NEEDLE MAYO 6 CRC TAPER PT (NEEDLE) ×4 IMPLANT
NEEDLE SPNL 18GX3.5 QUINCKE PK (NEEDLE) ×4 IMPLANT
NS IRRIG 1000ML POUR BTL (IV SOLUTION) ×8 IMPLANT
PACK VAGINAL WOMENS (CUSTOM PROCEDURE TRAY) ×4 IMPLANT
PAD OB MATERNITY 4.3X12.25 (PERSONAL CARE ITEMS) ×4 IMPLANT
PENCIL BUTTON HOLSTER BLD 10FT (ELECTRODE) ×4 IMPLANT
PLUG CATH AND CAP STER (CATHETERS) ×4 IMPLANT
RETRACTOR STAY HOOK 5MM (MISCELLANEOUS) ×4 IMPLANT
SET CYSTO W/LG BORE CLAMP LF (SET/KITS/TRAYS/PACK) ×4 IMPLANT
SHEET LAVH (DRAPES) ×4 IMPLANT
SUT CAPIO ETHIBPND (SUTURE) ×8 IMPLANT
SUT ETHIBOND 0 (SUTURE) ×4 IMPLANT
SUT SILK 2 0 FS (SUTURE) IMPLANT
SUT VIC AB 0 CT1 27 (SUTURE) ×2
SUT VIC AB 0 CT1 27XBRD ANBCTR (SUTURE) ×6 IMPLANT
SUT VIC AB 0 CT1 36 (SUTURE) ×8 IMPLANT
SUT VIC AB 0 CT2 27 (SUTURE) IMPLANT
SUT VIC AB 2-0 CT1 (SUTURE) ×8 IMPLANT
SUT VIC AB 2-0 CT1 18 (SUTURE) ×12 IMPLANT
SUT VIC AB 2-0 CT1 27 (SUTURE) ×2
SUT VIC AB 2-0 CT1 TAPERPNT 27 (SUTURE) ×6 IMPLANT
SUT VIC AB 2-0 SH 27 (SUTURE) ×8
SUT VIC AB 2-0 SH 27XBRD (SUTURE) ×24 IMPLANT
SUT VIC AB 3-0 PS2 18 (SUTURE)
SUT VIC AB 3-0 PS2 18XBRD (SUTURE) IMPLANT
SUT VICRYL 0 TIES 12 18 (SUTURE) ×4 IMPLANT
SUT VICRYL 0 UR6 27IN ABS (SUTURE) ×8 IMPLANT
TISSUE REPAIR XENFORM 6X10CM (Tissue) ×4 IMPLANT
TOWEL OR 17X24 6PK STRL BLUE (TOWEL DISPOSABLE) ×16 IMPLANT
TRAY FOLEY CATH 14FR (SET/KITS/TRAYS/PACK) ×8 IMPLANT
TUBING CONNECTING 10 (TUBING) ×4 IMPLANT
TUBING NON-CON 1/4 X 20 CONN (TUBING) ×4 IMPLANT
WATER STERILE IRR 1000ML POUR (IV SOLUTION) ×8 IMPLANT

## 2012-04-23 NOTE — Progress Notes (Signed)
Dr Arby Barrette informed that patient's bp 138/11 this am in SS.  No orders given.

## 2012-04-23 NOTE — Anesthesia Postprocedure Evaluation (Signed)
  Anesthesia Post-op Note  Patient: [name]  Procedure(s) Performed: Procedure(s) with comments: HYSTERECTOMY VAGINAL (N/A) SALPINGO OOPHORECTOMY (Bilateral) ANTERIOR   REPAIR CYSTOCELE (N/A) - cysto;graft 10x6 VAGINAL VAULT SUSPENSION (N/A) Patient is awake and responsive. Pain and nausea are reasonably well controlled. Vital signs are stable and clinically acceptable. Oxygen saturation is clinically acceptable. There are no apparent anesthetic complications at this time. Patient is ready for discharge.

## 2012-04-23 NOTE — Anesthesia Postprocedure Evaluation (Signed)
  Anesthesia Post-op Note  Patient: [name]  Procedure(s) Performed: Procedure(s) with comments: HYSTERECTOMY VAGINAL (N/A) SALPINGO OOPHORECTOMY (Bilateral) ANTERIOR   REPAIR CYSTOCELE (N/A) - cysto;graft 10x6 VAGINAL VAULT SUSPENSION (N/A) Anesthesia Post Note  Patient: [name]  Procedure(s) Performed: Procedure(s) (LRB): HYSTERECTOMY VAGINAL (N/A) SALPINGO OOPHORECTOMY (Bilateral) ANTERIOR   REPAIR CYSTOCELE (N/A) VAGINAL VAULT SUSPENSION (N/A)  Anesthesia type: General  Patient location: [facility]  Post pain: Pain level controlled  Post assessment: Post-op Vital signs reviewed  Last Vitals:  Filed Vitals:   04/23/12 1400  BP: 138/75  Pulse: 73  Temp:   Resp: 16    Post vital signs: Reviewed  Level of consciousness: sedated  Complications: No apparent anesthesia complications

## 2012-04-23 NOTE — Progress Notes (Signed)
Looks good No nerve/leg pain Reviewed surgery See in am

## 2012-04-23 NOTE — Progress Notes (Signed)
The patient was given preoperative antibiotics. I thought the urine was strong smelling and I will make sure that she goes home with oral antibiotics

## 2012-04-23 NOTE — Op Note (Addendum)
04/23/2012  1:29 PM  PATIENT:  [name]  72 y.o. female  PRE-OPERATIVE DIAGNOSIS: complete procidentia and postmenopausal vaginal bleeding, bilateral ovarian cysts  POST-OPERATIVE DIAGNOSIS:  same  PROCEDURE:  Procedure(s) with comments: HYSTERECTOMY VAGINAL (N/A) SALPINGO OOPHORECTOMY (Bilateral) ANTERIOR   REPAIR CYSTOCELE (N/A) - cysto;graft 10x6 VAGINAL VAULT SUSPENSION (N/A)  SURGEON:  Surgeon(s) and Role: Panel 1:    * Allie Bossier, MD - Primary  Panel 2:    * Martina Sinner, MD - Primary  PHYSICIAN ASSISTANT:   ASSISTANTS: Scheryl Darter, MD  ANESTHESIA:   general  EBL:  Total I/O In: 2600 [I.V.:2600] Out: 475 [Urine:200; Blood:275]  BLOOD ADMINISTERED:none  DRAINS: none  LOCAL MEDICATIONS USED:  MARCAINE     SPECIMEN:  Source of Specimen:  uterus, tubes, and ovaries  DISPOSITION OF SPECIMEN:  PATHOLOGY  COUNTS:  YES  TOURNIQUET:  * No tourniquets in log *  DICTATION: .Dragon Dictation  PLAN OF CARE: Admit for overnight observation  PATIENT DISPOSITION:  PACU - hemodynamically stable.   Delay start of Pharmacological VTE agent (>24hrs) due to surgical blood loss or risk of bleeding: not applicable  The risks, benefits, alternatives of surgery were explained, understood, and accepted. All questions were answered and a consent form was signed. She was taken to the operating room and general anesthesia was applied. She was put in the dorsal lithotomy position. Her vagina and abdomen were prepped and draped in the usual sterile fashion. A timeout procedure was done.  Complete prolapse of the cervix and uterus was noted. The cervix was grasped with a single-tooth tenaculum. A total of 100 cc of dilute Marcaine ( 30 mL of 0.5% marcaine + 100 mL of saline) was injected in a circumferential fashion at the cervicovaginal junction. An incision was made at the site. The posterior peritoneum was entered. A long weighted speculum was placed. The anterior  peritoneum was entered. A Deaver was placed anteriorly. The uterosacral ligaments were clamped, cut, and ligated. They were tagged and held. 2 Vicryl sutures were used throughout this case unless otherwise specified. The uterus was separated from its pelvic attachments using a similar clamp, cut, ligate technique. Excellent hemostasis was maintained throughout. Once the uterus was removed, the bowel was kept out of the operative site with a sponge on a stick. I was able to visualize each adnexa and grabbed them with a Babcock clamp. The ovaries both appeared to have a simple cyst. They were each somewhat adherent to the pelvic sidewall. I was able to free the adhesions. Using a Heaney clamp, I was able to clamp, cut, and ligate the infundibulopelvic ligaments on each side. Hemostasis was maintained. The ovaries were removed. Dr. Sherron Monday then began his portion of the case. The EBL up to this point was 100 mL.

## 2012-04-23 NOTE — Anesthesia Preprocedure Evaluation (Signed)
Anesthesia Evaluation  Patient identified by MRN, date of birth, ID band Patient awake    Reviewed: Allergy & Precautions, H&P , NPO status , Patient's Chart, lab work & pertinent test results  Airway Mallampati: II TM Distance: >3 FB Neck ROM: full    Dental no notable dental hx. (+) Teeth Intact   Pulmonary neg pulmonary ROS,    Pulmonary exam normal       Cardiovascular hypertension,     Neuro/Psych negative neurological ROS     GI/Hepatic negative GI ROS, Neg liver ROS,   Endo/Other  negative endocrine ROS  Renal/GU negative Renal ROS  negative genitourinary   Musculoskeletal negative musculoskeletal ROS (+)   Abdominal Normal abdominal exam  (+)   Peds negative pediatric ROS (+)  Hematology negative hematology ROS (+)   Anesthesia Other Findings   Reproductive/Obstetrics negative OB ROS                           Anesthesia Physical Anesthesia Plan  ASA: II  Anesthesia Plan: General   Post-op Pain Management:    Induction: Intravenous  Airway Management Planned:   Additional Equipment:   Intra-op Plan:   Post-operative Plan:   Informed Consent: I have reviewed the patients History and Physical, chart, labs and discussed the procedure including the risks, benefits and alternatives for the proposed anesthesia with the patient or authorized representative who has indicated his/her understanding and acceptance.   Dental Advisory Given  Plan Discussed with: CRNA and Surgeon  Anesthesia Plan Comments:         Anesthesia Quick Evaluation

## 2012-04-23 NOTE — Op Note (Signed)
Preoperative diagnosis: Vault prolapse and cystocele and rectocele Postoperative diagnoses vault prolapse and cystocele and rectocele Surgery: Vault suspension and cystocele repair and graft and cystoscopy Surgeon: Dr. Lorin Picket Albin Duckett Assistant: Pecola Leisure  Dr. Marice Potter performed a transvaginal hysterectomy on a patient with the above diagnoses who also had uterine descensus. When I entered the room the ureteral sacral ligaments were tagged and the cuff was opened. Ovaries and uterus had been removed. She had redundancy at the apex with some funneling of the  Vaginal wall mucosa. The ureteral sacral ligaments were very weak.  I reviewed preoperative laboratory testing and she was given preoperative antibiotics  I lowered the legs some since they were fairly high in the yellowfin stirrups. They did not appear to be under tension. I instilled 25 cc of a lidocaine epinephrine mixture. Between my Allis clamps I made a midline incision and sharply and bluntly dissected the anterior vaginal wall from the underlying pubocervical fascia to nearly the white line bilaterally. She had a very long area to dissect at the apex of the cuff that was opened. The ureteral sacral ligaments were almost medial to the plane that I wanted to dissect but I was very careful in doing so at the apex and also along the sidewalls.  I did an anterior repair not shortening the bladder or distorting the anatomy. Peritoneum had been identified. It was a 2 layer closure with 2-0 Vicryl.  I took my retraction down and cystoscoped the patient and she had no distortion of the ureters or trigone with excellent blue jets bilaterally  I then finger dissected to the initial spine bilaterally. It took me at least 45 minutes to safely identify the spine and more importantly the sacrospinous ligament and develop the proper plane allowing me to sweep tissues medially. I felt I was in an excellent plane throughout the entirety and also cephalad  enough. I checked my landmarks many times but the tissue was stuck not allowing me to safely feel the ligament to place a 0 Ethibond medially on the ligamwent. I was surprised that no bleeding was ever caused and again there is no injury. Finally I was able to place a 0 Ethibond 1 full finger breath medial to the ischial spine into the sacrospinous ligament bilaterally in a straight line between the spines. The suture on the right was a little bit more caudal but it was secure and I felt it was very acceptable. A rectal examination identified very good position of the 2 sutures and no bowel injury or suture in the rectum  With my usual dissection I passed a 0 Vicryl with a UR 6 needle into this pelvic sidewall bilaterally at the level of the urethrovesical angle. A 10 x 6 prepared graft and cut in the shape of a trapezoid was sewed in nicely between the 4 sutures tension-free. A lot of anterior vaginal wall was excised because there was so much redundancy at the apex. I closed the anterior vaginal wall with running 2-0 Vicryl on a CT1 needle.  I sutured the midline of the anterior vaginal wall the level the apex to the middle aspect of the graft with a 2-0 Vicryl. I closed the vaginal apex from right to left and left to right with 0 Vicryl on a CT1 needle and reapproximated the 2 ureteral sacral ligament sutures.  Before closing I did a modified McCall 2-0 Vicryl suture in the posterior vaginal apex soft tissue to minimize the risk of an enterocele. There was  a cone of redundant vaginal wall mucosa that I could push easily up towards the patient's head or pull it down but was not true supportive tissue.  I did a thorough rectal examination and there was no injury. She had mild weakness posteriorly an excellent length. There was really no defect other than some soft tissue weakness. There was one mucosal tear that I identified earlier at 7:00 and I closed with 2-0 Vicryl. There was no posterior apical defect  with a deep rectal examination  She had excellent vaginal length and very good support anteriorly and good support posteriorly. 1 Estrace pack was placed. Blood loss was less than 100 mL. Hopefully the procedure will reach the patient's treatment goal. She did not need a posterior repair or posterior apical repair. Leg position was secure throughout the case and she had excellent urine output

## 2012-04-23 NOTE — H&P (Addendum)
[name] is a 72 yo P3 who is here with a 10 year h/o uterine prolapse, worsening significantly over the last 6 months. She feels "pressure" and discomfort when she sits on hard surfaces. She denies and unusual bowel or bladder issues.  She is not sexually active since 2000. She declines a pessary. I had suggested a RATH and sacrocolpopexy with Dr. Seymour Bars, but she declines that option as well. She does want a TVH/BSO/ anterior and posterior repair and a suspension procedure by Dr. Windell Moment. Of note, she has had PMB for about 6 months. I did an EMBX that was normal.    Menstrual History: Menarche age: 2  No LMP recorded. Patient is postmenopausal.    Past Medical History  Diagnosis Date  . Hyperlipidemia   . Osteoarthritis     knee  . History of vaginal bleeding     post menopausal- gyn eval, ? polyp  . Colon polyp   . Schizoaffective disorder     paranoid personality- refuses treatment  . History of small bowel obstruction 9/09    likely due to adhesions- pt refused most dx and tx in hospital    Past Surgical History  Procedure Laterality Date  . Tonsillectomy    . Knee arthroscopy      right  . Laparoscopy  1970's    twisted fallopian tube  . Appendectomy    . Bladder tack  1976    Family History  Problem Relation Age of Onset  . Heart disease Mother   . Diabetes Mother   . Diabetes Father   . Heart disease Father   . Hypertension Father     Social History:  reports that she quit smoking about 30 years ago. Her smoking use included Cigarettes. She smoked 0.50 packs per day. She has never used smokeless tobacco. She reports that she does not drink alcohol or use illicit drugs.  Allergies:  Allergies  Allergen Reactions  . Penicillins Rash  . Sulfonamide Derivatives Rash    Prescriptions prior to admission  Medication Sig Dispense Refill  . Cyanocobalamin (VITAMIN B-12 PO) Take 5 mLs by mouth daily. 3 squirts approx equal to 1 teaspoon         ROS  Blood pressure 151/63, pulse 63, temperature 97.7 F (36.5 C), temperature source Oral, resp. rate 18, height 5\' 6"  (1.676 m), weight 73.483 kg (162 lb), SpO2 100.00%. Physical Exam Heart- rrr Lungs- CTAB Abd- benign Complete procedentia  No results found for this or any previous visit (from the past 24 hour(s)).  No results found.  Assessment/Plan: Complete procedentia- I will do a TVH/BSO (if I can get to her ovaries) and a posterior repair. She understands the risks of surgery including infection, bleeding, damage to bowel, bladder, ureters, risk of DVTs. She also understands that her vagina will be significantly smaller and intercourse may be difficult in the future. She says that she does not expect to have sex again.  Prezley Qadir C. 04/23/2012, 7:11 AM

## 2012-04-23 NOTE — Transfer of Care (Signed)
Immediate Anesthesia Transfer of Care Note  Patient: [name]  Procedure(s) Performed: Procedure(s) with comments: HYSTERECTOMY VAGINAL (N/A) SALPINGO OOPHORECTOMY (Bilateral) ANTERIOR (CYSTOCELE) AND POSTERIOR REPAIR (RECTOCELE) (N/A) - cysto;graft 10x6 VAGINAL VAULT SUSPENSION (N/A)  Patient Location: PACU  Anesthesia Type:General  Level of Consciousness: awake, oriented and patient cooperative  Airway & Oxygen Therapy: Patient Spontanous Breathing and Patient connected to nasal cannula oxygen  Post-op Assessment: Report given to PACU RN and Post -op Vital signs reviewed and stable  Post vital signs: Reviewed and stable  Complications: No apparent anesthesia complications

## 2012-04-24 ENCOUNTER — Encounter (HOSPITAL_COMMUNITY): Payer: Self-pay | Admitting: Obstetrics & Gynecology

## 2012-04-24 LAB — CBC
HCT: 26.7 % — ABNORMAL LOW (ref 36.0–46.0)
Hemoglobin: 9.2 g/dL — ABNORMAL LOW (ref 12.0–15.0)
MCHC: 34.5 g/dL (ref 30.0–36.0)
MCV: 88.1 fL (ref 78.0–100.0)

## 2012-04-24 MED ORDER — IBUPROFEN 600 MG PO TABS: 600.0000 mg | ORAL_TABLET | Freq: Four times a day (QID) | ORAL | Status: DC | PRN

## 2012-04-24 MED ORDER — OXYCODONE-ACETAMINOPHEN 5-325 MG PO TABS: 1.0000 | ORAL_TABLET | ORAL | Status: DC | PRN

## 2012-04-24 NOTE — Progress Notes (Signed)
Vitals normal Laboratory tests OK- Hb 9.2 but no tachycardia and feels fine Patient alert and stable Pain minimal and well-controlled Post treatment course discussed in detail Followup discussed in detail See orders Home with cipro- clinically no UTI pre op Voiding trial

## 2012-04-24 NOTE — Progress Notes (Signed)
Vaginal packing removed without difficulty.  Small to moderate amount of dark red/brown drainage.  No active bleeding noted.  Patient tolerated well.

## 2012-06-03 ENCOUNTER — Ambulatory Visit (INDEPENDENT_AMBULATORY_CARE_PROVIDER_SITE_OTHER): Payer: Medicare Other | Admitting: Obstetrics & Gynecology

## 2012-06-03 ENCOUNTER — Encounter: Payer: Self-pay | Admitting: Obstetrics & Gynecology

## 2012-06-03 VITALS — BP 156/65 | HR 66 | Ht 66.0 in | Wt 166.0 lb

## 2012-06-03 DIAGNOSIS — Z0000 Encounter for general adult medical examination without abnormal findings: Secondary | ICD-10-CM

## 2012-06-03 DIAGNOSIS — Z09 Encounter for follow-up examination after completed treatment for conditions other than malignant neoplasm: Secondary | ICD-10-CM

## 2012-06-03 NOTE — Progress Notes (Signed)
  Subjective:    Patient ID: [name], female    DOB: [birth year], 72 y.o.   MRN: [id number]  HPI  72 yo lady who is 6 weeks po s/p TVH/BSO/anterior repair and vaginal vault suspension is here for her post op visit. She has no complaints. She reports normal bowel and bladder function. She has not had sex.  Review of Systems  Pathology benign Mammogram due but she declines it. She also declines a flu vaccine    Objective:   Physical Exam  Well-healed vagina Some anterior wall sutures visible      Assessment & Plan:  Post op doing well.

## 2012-06-18 NOTE — Discharge Summary (Signed)
Date of admission: 04/23/2012  Date of discharge: 06/18/2012  Admission diagnosis: Cystocele  Discharge diagnosis: Cystocele/vault prolapse  Secondary diagnoses: uterine descensus  History and Physical: For full details, please see admission history and physical. Briefly, [name] is a 72 y.o. year old patient with the above diagnosis.Marland Kitchen   Hospital Course: She underwent hysterectomy and VP repair and cystocele repair and graft. Surgery and post op course uneventful. Discharged home one day post op  Laboratory values: No results found for this basename: HGB, HCT,  in the last 72 hours No results found for this basename: CREATININE,  in the last 72 hours  Disposition: Home  Discharge instruction: The patient was instructed to be ambulatory but told to refrain from heavy lifting, strenuous activity, or driving. Reviewed in detail  Discharge medications:    Medication List    TAKE these medications       ibuprofen 600 MG tablet  Commonly known as:  ADVIL,MOTRIN  Take 1 tablet (600 mg total) by mouth every 6 (six) hours as needed (mild pain).     VITAMIN B-12 PO  Take 5 mLs by mouth daily. 3 squirts approx equal to 1 teaspoon        Followup:

## 2012-07-24 ENCOUNTER — Ambulatory Visit: Payer: Medicare Other | Admitting: Family Medicine

## 2012-08-02 ENCOUNTER — Ambulatory Visit (INDEPENDENT_AMBULATORY_CARE_PROVIDER_SITE_OTHER): Payer: Medicare Other | Admitting: Family Medicine

## 2012-08-02 ENCOUNTER — Encounter: Payer: Self-pay | Admitting: Family Medicine

## 2012-08-02 VITALS — BP 148/74 | HR 68 | Temp 98.7°F | Wt 163.2 lb

## 2012-08-02 DIAGNOSIS — Z789 Other specified health status: Secondary | ICD-10-CM

## 2012-08-02 DIAGNOSIS — M25569 Pain in unspecified knee: Secondary | ICD-10-CM

## 2012-08-02 DIAGNOSIS — E785 Hyperlipidemia, unspecified: Secondary | ICD-10-CM

## 2012-08-02 DIAGNOSIS — E876 Hypokalemia: Secondary | ICD-10-CM

## 2012-08-02 LAB — COMPREHENSIVE METABOLIC PANEL
Albumin: 4.3 g/dL (ref 3.5–5.2)
Alkaline Phosphatase: 66 U/L (ref 39–117)
BUN: 14 mg/dL (ref 6–23)
Calcium: 9.6 mg/dL (ref 8.4–10.5)
Chloride: 102 mEq/L (ref 96–112)
Glucose, Bld: 77 mg/dL (ref 70–99)
Potassium: 4.1 mEq/L (ref 3.5–5.1)

## 2012-08-02 LAB — LIPID PANEL
Cholesterol: 242 mg/dL — ABNORMAL HIGH (ref 0–200)
Triglycerides: 104 mg/dL (ref 0.0–149.0)

## 2012-08-02 NOTE — Progress Notes (Signed)
Subjective:    Patient ID: [name], female    DOB: 1941-02-25, 72 y.o.   MRN: [id number]  HPI Here to discuss a health program she wants to start   She is reading about a program from a book called "brain grain" It stresses reducing grains and sugar  Encourages intake of vegetables and healthy fats   She is more constipated than she used to be   Book is written by a neurologist- and he recommends specific tests to do before starting the program   Fasting blood glucose  Hb a1c Fructosamine Fasting insulin  homocystiene  Vit D level  c reactive protein  cyrex array 3  She wants her cholesterol checked also    She is having trouble with her arthritis - so difficult to exercise  Hurts to walk  She stays stiff all over  She would like to see orthopedics - in [facility]  Patient Active Problem List   Diagnosis Date Noted  . Contusion, knee 10/30/2011  . Gynecological examination 06/20/2011  . Neuropathy 06/20/2011  . Uterine prolapse 06/20/2011  . Cough 04/26/2011  . UNSPECIFIED ANEMIA 06/18/2009  . ELEVATED BLOOD PRESSURE WITHOUT DIAGNOSIS OF HYPERTENSION 06/18/2009  . HYPOKALEMIA, MILD 06/25/2008  . INTESTINAL OBSTRUCTION, HX OF 12/11/2007  . BLEEDING, POSTMENOPAUSAL 10/23/2006  . HYPERLIPIDEMIA 10/22/2006  . PERSONALITY DISORDER 10/22/2006  . TREMOR, ESSENTIAL 10/22/2006  . OSTEOARTHRITIS 10/22/2006  . TRANSAMINASES, SERUM, ELEVATED 10/22/2006   Past Medical History  Diagnosis Date  . Hyperlipidemia   . Osteoarthritis     knee  . History of vaginal bleeding     post menopausal- gyn eval, ? polyp  . Colon polyp   . Schizoaffective disorder     paranoid personality- refuses treatment  . History of small bowel obstruction 9/09    likely due to adhesions- pt refused most dx and tx in hospital   Past Surgical History  Procedure Laterality Date  . Tonsillectomy    . Knee arthroscopy      right  . Laparoscopy  1970's    twisted fallopian tube   . Appendectomy    . Bladder tack  1976  . Vaginal hysterectomy N/A 04/23/2012    Procedure: HYSTERECTOMY VAGINAL;  Surgeon: Allie Bossier, MD;  Location: [facility];  Service: Gynecology;  Laterality: N/A;  . Salpingoophorectomy Bilateral 04/23/2012    Procedure: SALPINGO OOPHORECTOMY;  Surgeon: Allie Bossier, MD;  Location: [facility];  Service: Gynecology;  Laterality: Bilateral;  . Anterior and posterior repair N/A 04/23/2012    Procedure: ANTERIOR   REPAIR CYSTOCELE;  Surgeon: Martina Sinner, MD;  Location: [facility];  Service: Urology;  Laterality: N/A;  cysto;graft 10x6  . Vaginal prolapse repair N/A 04/23/2012    Procedure: VAGINAL VAULT SUSPENSION;  Surgeon: Martina Sinner, MD;  Location: [facility];  Service: Urology;  Laterality: N/A;   History  Substance Use Topics  . Smoking status: Former Smoker -- 0.50 packs/day    Types: Cigarettes    Quit date: 03/13/1982  . Smokeless tobacco: Never Used  . Alcohol Use: No   Family History  Problem Relation Age of Onset  . Heart disease Mother   . Diabetes Mother   . Diabetes Father   . Heart disease Father   . Hypertension Father    Allergies  Allergen Reactions  . Penicillins Rash  . Sulfonamide Derivatives Rash   Current Outpatient Prescriptions on File Prior to Visit  Medication Sig Dispense Refill  . Cyanocobalamin (VITAMIN  B-12 PO) Take 5 mLs by mouth daily. 3 squirts approx equal to 1 teaspoon      . ibuprofen (ADVIL,MOTRIN) 600 MG tablet Take 1 tablet (600 mg total) by mouth every 6 (six) hours as needed (mild pain).  60 tablet  1   No current facility-administered medications on file prior to visit.     Review of Systems Review of Systems  Constitutional: Negative for fever, appetite change, fatigue and unexpected weight change.  Eyes: Negative for pain and visual disturbance.  Respiratory: Negative for cough and shortness of breath.   Cardiovascular: Negative for cp or palpitations    Gastrointestinal: Negative for nausea,  diarrhea and constipation.  Genitourinary: Negative for urgency and frequency.  Skin: Negative for pallor or rash   MSK pos for knee pain and also general stiffness  Neurological: Negative for weakness, light-headedness, numbness and headaches.  Hematological: Negative for adenopathy. Does not bruise/bleed easily.  Psychiatric/Behavioral: Negative for dysphoric mood. The patient is not nervous/anxious.         Objective:   Physical Exam  Constitutional: She appears well-developed and well-nourished. No distress.  HENT:  Head: Normocephalic and atraumatic.  Mouth/Throat: Oropharynx is clear and moist.  Eyes: Conjunctivae and EOM are normal. Pupils are equal, round, and reactive to light. Right eye exhibits no discharge. Left eye exhibits no discharge. No scleral icterus.  Neck: Normal range of motion. Neck supple. No JVD present. Carotid bruit is not present. No thyromegaly present.  Cardiovascular: Normal rate, regular rhythm, normal heart sounds and intact distal pulses.  Exam reveals no gallop.   Pulmonary/Chest: Effort normal and breath sounds normal. No respiratory distress. She has no wheezes.  Abdominal: Soft. Bowel sounds are normal. She exhibits no distension, no abdominal bruit and no mass. There is no tenderness.  Musculoskeletal: She exhibits no edema and no tenderness.  Lymphadenopathy:    She has no cervical adenopathy.  Neurological: She is alert. She has normal reflexes. No cranial nerve deficit. She exhibits normal muscle tone. Coordination normal.  Skin: Skin is warm and dry. No rash noted. No erythema. No pallor.  Psychiatric:  Pt has a generally odd affect - with long pauses in speech and a general mood of dissatisfaction           Assessment & Plan:

## 2012-08-02 NOTE — Patient Instructions (Addendum)
Labs today for fasting glucose and for cholesterol and chemistries  Call your insurance about the other tests in the book to see what they cover  And write down the ones they do not cover-and call here to find out the costs of the test  Eat healthy/ stay as active as you can  We will refer you to orthopedics at check out

## 2012-08-04 NOTE — Assessment & Plan Note (Signed)
Lab today.

## 2012-08-04 NOTE — Assessment & Plan Note (Signed)
Ongoing and interfering with ability to exercise  Ref to ortho at pt's request for eval and tx

## 2012-08-04 NOTE — Assessment & Plan Note (Signed)
Reviewed the "brain grain" program she wants to try- and the labs recommended to start -some of which I am not familiar with  Recommended she check with her insurance to find out which of these labs would be covered , and then get the prices of the lab work that is not covered  Disc this plan -inst pt I am not very familiar with that either, and recommended a healthy diet with lean protein and fruit and veg with less sugar as well

## 2012-08-04 NOTE — Assessment & Plan Note (Signed)
Lab today Rev low sat fat diet  The new program she wants to try is high in healthy fats

## 2012-08-06 ENCOUNTER — Telehealth: Payer: Self-pay | Admitting: *Deleted

## 2012-08-06 ENCOUNTER — Encounter: Payer: Self-pay | Admitting: *Deleted

## 2012-08-06 DIAGNOSIS — G629 Polyneuropathy, unspecified: Secondary | ICD-10-CM

## 2012-08-06 DIAGNOSIS — Z789 Other specified health status: Secondary | ICD-10-CM

## 2012-08-06 NOTE — Telephone Encounter (Signed)
Pt was advise by you at her appt on 08/02/12 to check with her insurance and see if they will cover a vitamin D and a homocysteine test, pt called and insurance will pay for the test and pt will only have to pay 20% so pt wants to have these test done, pt schedule lab appt for Friday 08/09/12. Please put orders in

## 2012-08-09 ENCOUNTER — Other Ambulatory Visit (INDEPENDENT_AMBULATORY_CARE_PROVIDER_SITE_OTHER): Payer: Medicare Other

## 2012-08-09 DIAGNOSIS — Z789 Other specified health status: Secondary | ICD-10-CM

## 2012-08-09 DIAGNOSIS — G589 Mononeuropathy, unspecified: Secondary | ICD-10-CM

## 2012-08-09 DIAGNOSIS — G629 Polyneuropathy, unspecified: Secondary | ICD-10-CM

## 2012-08-21 ENCOUNTER — Encounter: Payer: Self-pay | Admitting: *Deleted

## 2012-08-27 ENCOUNTER — Encounter: Payer: Self-pay | Admitting: Internal Medicine

## 2012-10-14 ENCOUNTER — Ambulatory Visit (INDEPENDENT_AMBULATORY_CARE_PROVIDER_SITE_OTHER): Payer: Medicare Other | Admitting: Obstetrics & Gynecology

## 2012-10-14 ENCOUNTER — Encounter: Payer: Self-pay | Admitting: Obstetrics & Gynecology

## 2012-10-14 VITALS — BP 176/68 | HR 68 | Ht 66.0 in | Wt 161.0 lb

## 2012-10-14 DIAGNOSIS — N993 Prolapse of vaginal vault 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: Secondary | ICD-10-CM

## 2012-10-14 MED ORDER — ESTRADIOL 2 MG VA RING: 2.0000 mg | VAGINAL_RING | VAGINAL | Status: DC

## 2012-10-14 NOTE — Progress Notes (Signed)
  Subjective:    Patient ID: [name], female    DOB: 24-Apr-1940, 72 y.o.   MRN: [id number]  HPI  [name] is a 72 yo SW abstinent lady who had a TVH/BSO/cystocele repair and vault suspension (this part by Dr. Sherron Monday) on 04/23/12. She is here today because she has noted prolapse again. She is very disappointed.  Review of Systems She believes that people are watching her at night "They have their ways".    Objective:   Physical Exam  Vaginal atrophy and prolaspe of her vaginal cuff      Assessment & Plan:  We had at least a 30-40 minute visit discussing the cause of her prolapse as well as the treatment options including watchful waiting, pessary, colpocleisis and robotic sacrospinous fixation. She does not want to remove a pessary at her home because she is convinced that she is being watched. I have offered to clean her pessary monthly here in the office. I fitted her with a #4 ring which relieved her symptoms and caused her no discomfort. She will RTC in 2 weeks with a Estring which we will put in behind the pessary. She is strongly considering a colpocleisis but I have tried to discourage this because of its possible complications including incontinence.

## 2012-10-16 NOTE — Progress Notes (Signed)
Patient is not able to afford the estring due to cost of 300 dollars.  I will call in estradiol vaginal cream 0.2grams to [facility] and patient states she will be ok with this she can go in the closet with the light off and take care of inserting the cream three nights out of the week.  I also explained to her the reasoning for the estrogen cream again.  She says that so far the pessary is doing well and she is not feeling it and not having bulging of her vagina at this time.  She will keep her follow up appointment in two weeks to see Dr. Marice Potter to check the pessary and in the meantime she will pick this cream up tomorrow and start using it.

## 2012-10-31 ENCOUNTER — Ambulatory Visit (INDEPENDENT_AMBULATORY_CARE_PROVIDER_SITE_OTHER): Payer: Medicare Other | Admitting: Obstetrics & Gynecology

## 2012-10-31 ENCOUNTER — Encounter: Payer: Self-pay | Admitting: Obstetrics & Gynecology

## 2012-10-31 VITALS — BP 144/66 | HR 65 | Ht 66.0 in | Wt 161.0 lb

## 2012-10-31 DIAGNOSIS — N993 Prolapse of vaginal vault 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: Secondary | ICD-10-CM

## 2012-10-31 NOTE — Progress Notes (Signed)
  Subjective:    Patient ID: [name], female    DOB: 19-Aug-1940, 72 y.o.   MRN: [id number]  HPI  She is having no problems with her pessary. She will be coming here monthly for its removal and cleaning.   Review of Systems     Objective:   Physical Exam   Intact vaginal mucosa, no excoriations I removed, cleaned, and replaced her pessary     Assessment & Plan:  Prolapse of vaginal vault- continue pessary as above.

## 2012-12-10 ENCOUNTER — Ambulatory Visit (INDEPENDENT_AMBULATORY_CARE_PROVIDER_SITE_OTHER): Payer: Medicare Other | Admitting: Obstetrics & Gynecology

## 2012-12-10 ENCOUNTER — Encounter: Payer: Self-pay | Admitting: Obstetrics & Gynecology

## 2012-12-10 VITALS — BP 142/71 | HR 68 | Ht 67.0 in | Wt 157.0 lb

## 2012-12-10 DIAGNOSIS — N993 Prolapse of vaginal vault 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: Secondary | ICD-10-CM

## 2012-12-10 NOTE — Progress Notes (Signed)
  Subjective:    Patient ID: [name], female    DOB: 04-01-1940, 72 y.o.   MRN: [id number]  HPI  [name] is here today for her monthly pessary cleaning. It fell out once in the last month and she was able to reinsert it. She does not like using the vaginal estrogen.  Review of Systems     Objective:   Physical Exam  Speculum exam reveals a healthy vaginal mucosa free from abrasions. Minimal atrophy noted      Assessment & Plan:  Her pessary was cleaned. I told her that she can discontinue the vaginal estrogen if she desires and I will re check her mucosa next month. She declines the flu vaccine.

## 2013-01-08 ENCOUNTER — Ambulatory Visit (INDEPENDENT_AMBULATORY_CARE_PROVIDER_SITE_OTHER): Payer: Medicare Other | Admitting: Obstetrics & Gynecology

## 2013-01-08 ENCOUNTER — Encounter: Payer: Self-pay | Admitting: Obstetrics & Gynecology

## 2013-01-08 VITALS — BP 142/64 | HR 71 | Ht 67.0 in | Wt 156.8 lb

## 2013-01-08 DIAGNOSIS — N993 Prolapse of vaginal vault 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: Secondary | ICD-10-CM

## 2013-01-08 NOTE — Progress Notes (Signed)
  Subjective:    Patient ID: [name], female    DOB: 18-Sep-1940, 72 y.o.   MRN: [id number]  HPI  She is having no problems with her pessary and is here to have it cleaned. She has not used vaginal estrogen for the last month.  Review of Systems     Objective:   Physical Exam  Pessary removed and cleaned. A speculum exam reveals healthy vaginal mucosa with no excoriations.      Assessment & Plan:  Prolapse 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 RTC 4-5 weeks for pessary cleaning

## 2013-02-18 ENCOUNTER — Ambulatory Visit (INDEPENDENT_AMBULATORY_CARE_PROVIDER_SITE_OTHER): Payer: Medicare Other | Admitting: Obstetrics & Gynecology

## 2013-02-18 ENCOUNTER — Telehealth: Payer: Self-pay

## 2013-02-18 ENCOUNTER — Encounter: Payer: Self-pay | Admitting: Obstetrics & Gynecology

## 2013-02-18 VITALS — BP 160/70 | HR 75 | Ht 66.0 in | Wt 159.0 lb

## 2013-02-18 DIAGNOSIS — M25569 Pain in unspecified knee: Secondary | ICD-10-CM

## 2013-02-18 DIAGNOSIS — N993 Prolapse of vaginal vault 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: Secondary | ICD-10-CM

## 2013-02-18 DIAGNOSIS — M1990 Unspecified osteoarthritis, unspecified site: Secondary | ICD-10-CM

## 2013-02-18 NOTE — Progress Notes (Signed)
   Subjective:    Patient ID: [name], female    DOB: March 13, 1941, 72 y.o.   MRN: [id number]  HPI  She is here today for a pessary check. She still prefers to have her pessary cleaned here as opposed to doing it at home herself.  Review of Systems     Objective:   Physical Exam   I removed her pessary and inspected the vaginal vault. There are no excoriations.     Assessment & Plan:  Prolapse- continue with pessary RTC 6 weeks.

## 2013-02-18 NOTE — Telephone Encounter (Signed)
Referral done

## 2013-02-18 NOTE — Telephone Encounter (Signed)
Shirlee Limerick advise and will call pt before setting up appt

## 2013-02-18 NOTE — Telephone Encounter (Signed)
Pt left /vm still having a lot of knee pain in both knees and pt request referral to orthopedist at [facility]; pt is interested in having knee surgery. Pt was seen 08/02/12 and had referral to [place] [facility]; does pt need to be seen prior to referral to [facility]. If pt does not need appt with Dr Milinda Antis prior to referral pt request cb before [facility] referral made to discuss name of doctor to contact.Please advise.

## 2013-03-18 ENCOUNTER — Telehealth: Payer: Self-pay | Admitting: Family Medicine

## 2013-03-18 NOTE — Telephone Encounter (Signed)
Spoke with front desk about sending these types of phone calls to [facility] or scheduling pt appt.

## 2013-03-18 NOTE — Telephone Encounter (Signed)
Left message on patient's voicemail to return call with information requested from Dr. Glori Bickers.  Note sent to [facility] concerning ? Of [facility] triage.

## 2013-03-18 NOTE — Telephone Encounter (Signed)
Patient advised. Appointment scheduled.  

## 2013-03-18 NOTE — Telephone Encounter (Signed)
Please ask how high her temp is , how many days she has had symptoms, if she has had flu contact and let me know    (shouldn't [facility] have triaged this?)

## 2013-03-18 NOTE — Telephone Encounter (Signed)
Patient thinks she has the flu.  She stated she has all the symptoms, muscle pain, runny nose, sneezing, chills, and weak.  Patient want to know if it's something you can call in to the pharmacy.

## 2013-03-18 NOTE — Telephone Encounter (Signed)
She can try mucinex for congestion as needed  I wonder about an ear infection also---- please schedule her with [facility] this week to check it out - especially since she has had symptoms so long

## 2013-03-18 NOTE — Telephone Encounter (Signed)
Pt said temp was 97 today; but pt knows she has had fever but has not taken fever before today. Symptoms started 03/04/13. No contact to pts knowledge with anyone who has the flu; pt visited nursing home 03/01/13 and that is when symptoms started. Pt presently has chills, achy all over,lt earache, pain level now 5,no drainage from ear,scratchy sore throat,not a lot of coughing. [facility]. Pt request cb.

## 2013-03-20 ENCOUNTER — Encounter: Payer: Self-pay | Admitting: Internal Medicine

## 2013-03-20 ENCOUNTER — Ambulatory Visit (INDEPENDENT_AMBULATORY_CARE_PROVIDER_SITE_OTHER): Payer: Medicare HMO | Admitting: Internal Medicine

## 2013-03-20 VITALS — BP 126/72 | HR 71 | Temp 97.2°F | Wt 164.0 lb

## 2013-03-20 DIAGNOSIS — B9789 Other viral agents as the cause of diseases classified elsewhere: Secondary | ICD-10-CM

## 2013-03-20 DIAGNOSIS — B349 Viral infection, unspecified: Secondary | ICD-10-CM

## 2013-03-20 NOTE — Patient Instructions (Signed)

## 2013-03-20 NOTE — Progress Notes (Signed)
Pre-visit discussion using our clinic review tool. No additional management support is needed unless otherwise documented below in the visit note.  

## 2013-03-20 NOTE — Progress Notes (Signed)
HPI  Pt presents to the clinic today with c/o left ear pain and sore throat. This started a few weeks ago. She also c/o a cough, runny nose, headache. The cough is unproductive. She reports she has had fever but she has not actually taken her temp. She reports that she has had the flu since 03/02/13. She has had taken Ibuprofen, and zinc. She has been taking charcoal tablets, crushed them, and sniffing it up her nose. She has had sick contacts. She does report that her symptoms are much better now.  Review of Systems      Past Medical History  Diagnosis Date  . Hyperlipidemia   . Osteoarthritis     knee  . History of vaginal bleeding     post menopausal- gyn eval, ? polyp  . Colon polyp   . Schizoaffective disorder     paranoid personality- refuses treatment  . History of small bowel obstruction 9/09    likely due to adhesions- pt refused most dx and tx in hospital    Family History  Problem Relation Age of Onset  . Heart disease Mother   . Diabetes Mother   . Diabetes Father   . Heart disease Father   . Hypertension Father     History   Social History  . Marital Status: Widowed    Spouse Name: N/A    Number of Children: 3  . Years of Education: N/A   Occupational History  . retired Pharmacist, hospital    Social History Main Topics  . Smoking status: Former Smoker -- 0.50 packs/day    Types: Cigarettes    Quit date: 03/13/1982  . Smokeless tobacco: Never Used  . Alcohol Use: No  . Drug Use: No  . Sexual Activity: Yes    Birth Control/ Protection: Post-menopausal   Other Topics Concern  . Not on file   Social History Narrative   Lives alone    Allergies  Allergen Reactions  . Penicillins Rash  . Sulfonamide Derivatives Rash     Constitutional:  Denies headache, malaise, fatigue or abrupt weight changes.  HEENT:  Positive sore throat. Denies eye redness, eye pain, pressure behind the eyes, facial pain, nasal congestion, ear pain, ringing in the ears, wax buildup,  runny nose or bloody nose. Respiratory: Positive cough. Denies difficulty breathing or shortness of breath.  Cardiovascular: Denies chest pain, chest tightness, palpitations or swelling in the hands or feet.   No other specific complaints in a complete review of systems (except as listed in HPI above).  Objective:   BP 126/72  Pulse 71  Temp(Src) 97.2 F (36.2 C) (Oral)  Wt 164 lb (74.39 kg)  SpO2 99% Wt Readings from Last 3 Encounters:  03/20/13 164 lb (74.39 kg)  02/18/13 159 lb (72.122 kg)  01/08/13 156 lb 12.8 oz (71.124 kg)     General: Appears her stated age, well developed, well nourished in NAD. HEENT: Head: normal shape and size; Eyes: sclera white, no icterus, conjunctiva pink, PERRLA and EOMs intact; Ears: Tm's gray and intact, normal light reflex; Nose: mucosa pink and moist, septum midline; Throat/Mouth: + PND. Teeth present, mucosa erythematous and moist, no exudate noted, no lesions or ulcerations noted.  Neck: Neck supple, trachea midline. No massses, lumps or thyromegaly present.  Cardiovascular: Normal rate and rhythm. S1,S2 noted.  No murmur, rubs or gallops noted. No JVD or BLE edema. No carotid bruits noted. Pulmonary/Chest: Normal effort and positive vesicular breath sounds. No respiratory distress. No wheezes, rales  or ronchi noted.      Assessment & Plan:   Viral illness which seems resolved:  I did advise her to stop snorting charcoal up her nose Drink plenty of fluids and get some rest I reassured her that I do not think she has the flu  RTC as needed or if symptoms persist.

## 2013-04-01 ENCOUNTER — Encounter: Payer: Self-pay | Admitting: Obstetrics & Gynecology

## 2013-04-01 ENCOUNTER — Ambulatory Visit (INDEPENDENT_AMBULATORY_CARE_PROVIDER_SITE_OTHER): Payer: Medicare HMO | Admitting: Obstetrics & Gynecology

## 2013-04-01 VITALS — BP 157/78 | HR 63 | Ht 66.0 in | Wt 162.0 lb

## 2013-04-01 DIAGNOSIS — N993 Prolapse of vaginal vault 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: Secondary | ICD-10-CM

## 2013-04-01 NOTE — Progress Notes (Signed)
   Subjective:    Patient ID: [name], female    DOB: 1940-07-20, 73 y.o.   MRN: [id number]  HPI [name] is here to have her pessary cleaned and her vagina inspected. She has no complaints today.   Review of Systems     Objective:   Physical Exam  Normal vulva and vagina with no excoriations.      Assessment & Plan:  RTC 2 months for pessary cleaning

## 2013-04-09 ENCOUNTER — Encounter: Payer: Self-pay | Admitting: Internal Medicine

## 2013-05-28 ENCOUNTER — Ambulatory Visit (INDEPENDENT_AMBULATORY_CARE_PROVIDER_SITE_OTHER): Payer: Medicare HMO | Admitting: Obstetrics & Gynecology

## 2013-05-28 ENCOUNTER — Encounter: Payer: Self-pay | Admitting: Obstetrics & Gynecology

## 2013-05-28 VITALS — BP 135/79 | HR 68 | Wt 166.0 lb

## 2013-05-28 DIAGNOSIS — N993 Prolapse of vaginal vault 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: Secondary | ICD-10-CM

## 2013-05-28 NOTE — Progress Notes (Signed)
   Subjective:    Patient ID: [name], female    DOB: April 16, 1940, 73 y.o.   MRN: [id number]  HPI  Ms. Sek is here for her q 2 month pessary check. She has lots of knee pain.  Review of Systems     Objective:   Physical Exam  I removed the pessary and cleaned it. I noted a 1 cm superficial erosion on the right sidewall mid way up the vagina.      Assessment & Plan:  Prolapse with need for pessary She will leave the pessary out for a week and I will recheck it in a week.

## 2013-06-03 ENCOUNTER — Ambulatory Visit (INDEPENDENT_AMBULATORY_CARE_PROVIDER_SITE_OTHER): Payer: Medicare HMO | Admitting: Obstetrics & Gynecology

## 2013-06-03 ENCOUNTER — Encounter: Payer: Self-pay | Admitting: Obstetrics & Gynecology

## 2013-06-03 VITALS — BP 139/77 | HR 84 | Wt 161.0 lb

## 2013-06-03 DIAGNOSIS — N993 Prolapse of vaginal vault 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: Secondary | ICD-10-CM

## 2013-06-03 NOTE — Progress Notes (Signed)
   Subjective:    Patient ID: [name], female    DOB: 12/19/1940, 73 y.o.   MRN: [id number]  HPI  [name] is here to check to see if her vaginal wall erosion is healed so that she can resume using her pessary.  Review of Systems     Objective:   Physical Exam  The 1 cm area on the right vaginal wall has improved, but is not entirely healed.      Assessment & Plan:  She will RTC in 1 week for another check. I will attempt to get her estrogen samples from another office as we don't have any here.

## 2013-06-12 ENCOUNTER — Encounter: Payer: Self-pay | Admitting: Obstetrics & Gynecology

## 2013-06-12 ENCOUNTER — Ambulatory Visit (INDEPENDENT_AMBULATORY_CARE_PROVIDER_SITE_OTHER): Payer: Medicare HMO | Admitting: Obstetrics & Gynecology

## 2013-06-12 VITALS — BP 145/64 | HR 62 | Ht 64.5 in | Wt 161.0 lb

## 2013-06-12 DIAGNOSIS — N993 Prolapse of vaginal vault 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: Secondary | ICD-10-CM

## 2013-06-12 NOTE — Progress Notes (Signed)
   Subjective:    Patient ID: [name], female    DOB: 1940-04-24, 73 y.o.   MRN: [id number]  HPI  She is here today for a recheck of her vaginal erosion. She doesn't feel able to place vaginal estrogen at home in her vagina.  Review of Systems     Objective:   Physical Exam  Her erosion has healed. I placed her pessary back in.      Assessment & Plan:  Prolapse 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 RTC 1 month for recheck

## 2013-07-14 ENCOUNTER — Encounter: Payer: Self-pay | Admitting: Obstetrics & Gynecology

## 2013-07-14 ENCOUNTER — Ambulatory Visit (INDEPENDENT_AMBULATORY_CARE_PROVIDER_SITE_OTHER): Payer: Medicare HMO | Admitting: Obstetrics & Gynecology

## 2013-07-14 VITALS — BP 144/59 | HR 81 | Ht 65.0 in | Wt 160.0 lb

## 2013-07-14 DIAGNOSIS — N993 Prolapse of vaginal vault 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: Secondary | ICD-10-CM

## 2013-07-14 DIAGNOSIS — E559 Vitamin D deficiency, unspecified: Secondary | ICD-10-CM

## 2013-07-14 MED ORDER — CHOLECALCIFEROL 10 MCG (400 UNIT) PO TABS: 400.0000 [IU] | ORAL_TABLET | Freq: Every day | ORAL | Status: DC

## 2013-07-14 NOTE — Progress Notes (Signed)
Pessary check

## 2013-07-14 NOTE — Progress Notes (Signed)
   Subjective:    Patient ID: [name], female    DOB: [birth year], 73 y.o.   MRN: [id number]  HPI  She is here today to have her pessary replaced back in her vagina. It fell out 2 days after she left the office last month, and she was unable to put it back in. She has no complaints.  Review of Systems     Objective:   Physical Exam  Her bulging vaginal mucosa looks healthy I replaced the pessary back into the vagina.      Assessment & Plan:  Vit D deficience noted last year. She tells me that she has not been taking the Vit D that Dr. Glori Bickers prescribed. I represcribed it today and encouraged her to take it. Pessary- RTC 1 month prn sooner if it falls out

## 2013-08-14 ENCOUNTER — Encounter: Payer: Self-pay | Admitting: Obstetrics & Gynecology

## 2013-08-14 ENCOUNTER — Ambulatory Visit (INDEPENDENT_AMBULATORY_CARE_PROVIDER_SITE_OTHER): Payer: Medicare HMO | Admitting: Obstetrics & Gynecology

## 2013-08-14 VITALS — BP 153/68 | HR 66 | Ht 64.0 in | Wt 163.0 lb

## 2013-08-14 DIAGNOSIS — N993 Prolapse of vaginal vault 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: Secondary | ICD-10-CM | POA: Insufficient documentation

## 2013-08-14 NOTE — Progress Notes (Signed)
   Subjective:    Patient ID: [name], female    DOB: 08/25/1940, 73 y.o.   MRN: [id number]  HPI  Ms. Crow is here for a pessary check. She reports that her ring pessary fell out the day she left the office last month. She would like for me to do a colpocleisis. She reports that there is absolutely no chance that she ever wants to have sex.  Review of Systems     Objective:   Physical Exam Healthy mucosa with complete prolapse of vagina       Assessment & Plan:  I placed a Hodge pessary with good effect.  RTC 1 month for pessary check I will send [place] an email to schedule her for a colpocleisis

## 2013-08-26 ENCOUNTER — Encounter: Payer: Self-pay | Admitting: Podiatry

## 2013-08-26 ENCOUNTER — Ambulatory Visit (INDEPENDENT_AMBULATORY_CARE_PROVIDER_SITE_OTHER): Payer: Medicare PPO | Admitting: Podiatry

## 2013-08-26 VITALS — BP 136/69 | HR 64 | Resp 16

## 2013-08-26 DIAGNOSIS — M79609 Pain in unspecified limb: Secondary | ICD-10-CM

## 2013-08-26 DIAGNOSIS — B351 Tinea unguium: Secondary | ICD-10-CM

## 2013-08-26 NOTE — Progress Notes (Signed)
   Subjective:    Patient ID: [name], female    DOB: [birth year], 73 y.o.   MRN: [id number]  HPI Comments: For years i have had a fungus in this left great toenail and recently it is turning white and lifting up.     Review of Systems     Objective:   Physical Exam        Assessment & Plan:

## 2013-08-26 NOTE — Progress Notes (Signed)
Subjective:     Patient ID: [name], female   DOB: 06-19-1940, 73 y.o.   MRN: [id number]  HPI patient states that have fungus for years and my big toenail is almost off secondary to meet bumping it a few weeks ago   Review of Systems  All other systems reviewed and are negative.      Objective:   Physical Exam  Nursing note and vitals reviewed. Constitutional: She is oriented to person, place, and time.  Cardiovascular: Intact distal pulses.   Musculoskeletal: Normal range of motion.  Neurological: She is oriented to person, place, and time.  Skin: Skin is warm.   neurovascular status intact with health history unchanged and found to have a damaged big toenail left that is completely detached except for one small spot on the medial side proximal it is yellow in it's appearance. Patient's found to have excellent range of motion subtalar midtarsal joint good muscle strength and digits are well perfused     Assessment:     Severe nail trauma left with chronic fungal infection    Plan:     H&P performed and remove nail today. It is clean tissue underneath and allow a new nail to regrow and we will start formula 3 considerations long-term for laser or oral treatment. Reappoint in 4 months to reevaluate

## 2013-09-18 ENCOUNTER — Encounter: Payer: Self-pay | Admitting: Obstetrics & Gynecology

## 2013-09-18 ENCOUNTER — Ambulatory Visit (INDEPENDENT_AMBULATORY_CARE_PROVIDER_SITE_OTHER): Payer: Medicare HMO | Admitting: Obstetrics & Gynecology

## 2013-09-18 VITALS — BP 157/68 | HR 64 | Ht 65.0 in | Wt 164.0 lb

## 2013-09-18 DIAGNOSIS — N993 Prolapse of vaginal vault 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: Secondary | ICD-10-CM

## 2013-09-18 MED ORDER — ESTRADIOL 0.1 MG/GM VA CREA: TOPICAL_CREAM | VAGINAL | Status: DC

## 2013-09-18 NOTE — Progress Notes (Signed)
   Subjective:    Patient ID: [name], female    DOB: Jun 29, 1940, 73 y.o.   MRN: [id number]  HPI [name] is happy with the [facility] pessary and she has decided to forego the colpocleisis.   Review of Systems     Objective:   Physical Exam  Normal vaginal mucosa. No evidence of an erosion      Assessment & Plan:  Prolapse of vaginal vault 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-continue Hodge pessary and monthly checks

## 2013-10-28 ENCOUNTER — Inpatient Hospital Stay: Admit: 2013-10-28 | Payer: Self-pay | Admitting: Obstetrics & Gynecology

## 2013-10-28 SURGERY — ANTERIOR (CYSTOCELE) AND POSTERIOR REPAIR (RECTOCELE)
Anesthesia: General

## 2013-11-04 ENCOUNTER — Encounter: Payer: Self-pay | Admitting: Obstetrics & Gynecology

## 2013-11-04 ENCOUNTER — Ambulatory Visit (INDEPENDENT_AMBULATORY_CARE_PROVIDER_SITE_OTHER): Payer: Medicare HMO | Admitting: Obstetrics & Gynecology

## 2013-11-04 VITALS — BP 154/62 | HR 63 | Ht 65.0 in | Wt 168.0 lb

## 2013-11-04 DIAGNOSIS — N993 Prolapse of vaginal vault 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: Secondary | ICD-10-CM

## 2013-11-04 NOTE — Progress Notes (Signed)
   Subjective:    Patient ID: [name], female    DOB: May 25, 1940, 73 y.o.   MRN: [id number]  HPI  [name] is here because her last pessary did not stay in longer than a week.   Review of Systems     Objective:   Physical Exam  We tried various sizes and shapes pessaries today. None worked.      Assessment & Plan:  We will order a 1 1/2 inch cube for her to try when it arrives.

## 2013-12-30 ENCOUNTER — Ambulatory Visit: Payer: Medicare PPO | Admitting: Podiatry

## 2014-01-12 ENCOUNTER — Encounter: Payer: Self-pay | Admitting: Obstetrics & Gynecology

## 2014-02-19 DIAGNOSIS — Z85828 Personal history of other malignant neoplasm of skin: Secondary | ICD-10-CM | POA: Insufficient documentation

## 2014-06-16 ENCOUNTER — Telehealth: Payer: Self-pay | Admitting: Family Medicine

## 2014-06-16 DIAGNOSIS — R740 Nonspecific elevation of levels of transaminase and lactic acid dehydrogenase [LDH]: Secondary | ICD-10-CM

## 2014-06-16 DIAGNOSIS — R7402 Elevation of levels of lactic acid dehydrogenase (LDH): Secondary | ICD-10-CM

## 2014-06-16 DIAGNOSIS — E785 Hyperlipidemia, unspecified: Secondary | ICD-10-CM

## 2014-06-16 DIAGNOSIS — G629 Polyneuropathy, unspecified: Secondary | ICD-10-CM

## 2014-06-16 DIAGNOSIS — D649 Anemia, unspecified: Secondary | ICD-10-CM

## 2014-06-16 DIAGNOSIS — E876 Hypokalemia: Secondary | ICD-10-CM

## 2014-06-16 DIAGNOSIS — R7401 Elevation of levels of liver transaminase levels: Secondary | ICD-10-CM

## 2014-06-16 DIAGNOSIS — E559 Vitamin D deficiency, unspecified: Secondary | ICD-10-CM

## 2014-06-16 NOTE — Telephone Encounter (Signed)
-----   Message from Ellamae Sia sent at 06/15/2014  3:06 PM EDT ----- Regarding: Lab orders for Wednesday, 4.6.16 Patient is scheduled for CPX labs, please order future labs, Thanks , Karna Christmas

## 2014-06-17 ENCOUNTER — Other Ambulatory Visit: Payer: Medicare HMO

## 2014-06-23 ENCOUNTER — Encounter: Payer: Medicare HMO | Admitting: Family Medicine

## 2014-06-23 DIAGNOSIS — Z0289 Encounter for other administrative examinations: Secondary | ICD-10-CM

## 2014-09-25 ENCOUNTER — Encounter: Payer: Self-pay | Admitting: Internal Medicine

## 2014-12-25 DIAGNOSIS — M170 Bilateral primary osteoarthritis of knee: Secondary | ICD-10-CM | POA: Diagnosis not present

## 2014-12-28 ENCOUNTER — Encounter: Payer: Self-pay | Admitting: Family Medicine

## 2014-12-28 ENCOUNTER — Other Ambulatory Visit: Payer: Self-pay | Admitting: Family Medicine

## 2014-12-28 ENCOUNTER — Ambulatory Visit (INDEPENDENT_AMBULATORY_CARE_PROVIDER_SITE_OTHER): Payer: Medicare PPO | Admitting: Family Medicine

## 2014-12-28 VITALS — BP 150/92 | HR 60 | Temp 97.8°F | Ht 65.0 in | Wt 172.8 lb

## 2014-12-28 DIAGNOSIS — R030 Elevated blood-pressure reading, without diagnosis of hypertension: Secondary | ICD-10-CM | POA: Diagnosis not present

## 2014-12-28 DIAGNOSIS — E785 Hyperlipidemia, unspecified: Secondary | ICD-10-CM

## 2014-12-28 DIAGNOSIS — E559 Vitamin D deficiency, unspecified: Secondary | ICD-10-CM | POA: Diagnosis not present

## 2014-12-28 LAB — VITAMIN D 25 HYDROXY (VIT D DEFICIENCY, FRACTURES): VITD: 39.07 ng/mL (ref 30.00–100.00)

## 2014-12-28 LAB — COMPREHENSIVE METABOLIC PANEL
ALBUMIN: 4.5 g/dL (ref 3.5–5.2)
ALK PHOS: 56 U/L (ref 39–117)
ALT: 17 U/L (ref 0–35)
AST: 21 U/L (ref 0–37)
BILIRUBIN TOTAL: 0.6 mg/dL (ref 0.2–1.2)
BUN: 21 mg/dL (ref 6–23)
CALCIUM: 10.1 mg/dL (ref 8.4–10.5)
CO2: 27 mEq/L (ref 19–32)
CREATININE: 0.66 mg/dL (ref 0.40–1.20)
Chloride: 99 mEq/L (ref 96–112)
GFR: 92.7 mL/min (ref >60.00)
Glucose, Bld: 82 mg/dL (ref 70–99)
Potassium: 3.7 mEq/L (ref 3.5–5.1)
Sodium: 137 mEq/L (ref 135–145)
Total Protein: 6.9 g/dL (ref 6.0–8.3)

## 2014-12-28 LAB — TSH: TSH: 3.26 u[IU]/mL (ref 0.35–4.50)

## 2014-12-28 NOTE — Progress Notes (Signed)
Subjective:    Patient ID: [name], female    DOB: 1940/08/31, 74 y.o.   MRN: [id number]  HPI  Here for f/u of chronic medical problems   Wt is up 4 lb with bmi of 28   Wants to check her cholesterol  She is interested in lipo science profile  Lab Results  Component Value Date   CHOL 242* 08/02/2012   HDL 79.30 08/02/2012   LDLCALC 121* 12/11/2007   LDLDIRECT 143.7 08/02/2012   TRIG 104.0 08/02/2012   CHOLHDL 3 08/02/2012    Elevated blood pressure  BP Readings from Last 3 Encounters:  12/28/14 150/92  11/04/13 154/62  09/18/13 157/68    Has a family history of high blood pressure Does not get a lot of exercise   Not eating healthy lately  Some fruit and vegetables   Reading a book - "eat fat get thin"- about eating healthier fats  Was on it for a while - she did loose weight and felt better and then she got off track  She started eating out more    Suffering from arthritis  Getting injections in her knees Walks with a cane   Patient Active Problem List   Diagnosis Date Noted  . Vitamin D deficiency 06/16/2014  . Prolapse of vaginal vault 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 08/14/2013  . Patient on low glycemic diet 08/02/2012  . Knee pain 08/02/2012  . Contusion, knee 10/30/2011  . Gynecological examination 06/20/2011  . Neuropathy ([place]) 06/20/2011  . Uterine prolapse 06/20/2011  . Cough 04/26/2011  . Anemia 06/18/2009  . ELEVATED BLOOD PRESSURE WITHOUT DIAGNOSIS OF HYPERTENSION 06/18/2009  . HYPOKALEMIA, MILD 06/25/2008  . INTESTINAL OBSTRUCTION, HX OF 12/11/2007  . BLEEDING, POSTMENOPAUSAL 10/23/2006  . Hyperlipidemia 10/22/2006  . PERSONALITY DISORDER 10/22/2006  . TREMOR, ESSENTIAL 10/22/2006  . OSTEOARTHRITIS 10/22/2006  . TRANSAMINASES, SERUM, ELEVATED 10/22/2006   Past Medical History  Diagnosis Date  . Hyperlipidemia   . Osteoarthritis     knee  . History of vaginal bleeding     post menopausal- gyn eval, ? polyp  . Colon polyp   .  Schizoaffective disorder     paranoid personality- refuses treatment  . History of small bowel obstruction 9/09    likely due to adhesions- pt refused most dx and tx in hospital  . Vitamin D deficiency    Past Surgical History  Procedure Laterality Date  . Tonsillectomy    . Knee arthroscopy      right  . Laparoscopy  1970's    twisted fallopian tube  . Appendectomy    . Bladder tack  1976  . Vaginal hysterectomy N/A 04/23/2012    Procedure: HYSTERECTOMY VAGINAL;  Surgeon: Emily Filbert, MD;  Location: [facility];  Service: Gynecology;  Laterality: N/A;  . Salpingoophorectomy Bilateral 04/23/2012    Procedure: SALPINGO OOPHORECTOMY;  Surgeon: Emily Filbert, MD;  Location: [facility];  Service: Gynecology;  Laterality: Bilateral;  . Anterior and posterior repair N/A 04/23/2012    Procedure: ANTERIOR   REPAIR CYSTOCELE;  Surgeon: Reece Packer, MD;  Location: [facility];  Service: Urology;  Laterality: N/A;  cysto;graft 10x6  . Vaginal prolapse repair N/A 04/23/2012    Procedure: VAGINAL VAULT SUSPENSION;  Surgeon: Reece Packer, MD;  Location: [facility];  Service: Urology;  Laterality: N/A;   Social History  Substance Use Topics  . Smoking status: Former Smoker -- 0.50 packs/day    Types: Cigarettes    Quit date: 03/13/1982  .  Smokeless tobacco: Never Used  . Alcohol Use: No   Family History  Problem Relation Age of Onset  . Heart disease Mother   . Diabetes Mother   . Diabetes Father   . Heart disease Father   . Hypertension Father    Allergies  Allergen Reactions  . Penicillins Rash  . Sulfonamide Derivatives Rash   Current Outpatient Prescriptions on File Prior to Visit  Medication Sig Dispense Refill  . cholecalciferol (VITAMIN D) 400 UNITS TABS tablet Take 1 tablet (400 Units total) by mouth daily. 30 each 11  . Cyanocobalamin (VITAMIN B-12 PO) Take 5 mLs by mouth daily. 3 squirts approx equal to 1 teaspoon    . estradiol (ESTRACE) 0.1 MG/GM vaginal cream Apply 1 gram per  vagina every night for 2 weeks, then apply three times a week 30 g 12  . ibuprofen (ADVIL,MOTRIN) 600 MG tablet Take 1 tablet (600 mg total) by mouth every 6 (six) hours as needed (mild pain). 60 tablet 1  . vitamin E 100 UNIT capsule Take by mouth daily.     No current facility-administered medications on file prior to visit.      Review of Systems Review of Systems  Constitutional: Negative for fever, appetite change, and unexpected weight change.  Eyes: Negative for pain and visual disturbance.  Respiratory: Negative for cough and shortness of breath.   Cardiovascular: Negative for cp or palpitations    Gastrointestinal: Negative for nausea, diarrhea and constipation.  Genitourinary: Negative for urgency and frequency.  Skin: Negative for pallor or rash   Neurological: Negative for weakness, light-headedness, numbness and headaches.  Hematological: Negative for adenopathy. Does not bruise/bleed easily.  Psychiatric/Behavioral: Negative for dysphoric mood. The patient is not nervous/anxious.         Objective:   Physical Exam  Constitutional: She appears well-developed and well-nourished. No distress.  overwt and well appearing   HENT:  Head: Normocephalic and atraumatic.  Mouth/Throat: Oropharynx is clear and moist.  Eyes: Conjunctivae and EOM are normal. Pupils are equal, round, and reactive to light.  Neck: Normal range of motion. Neck supple. No JVD present. Carotid bruit is not present. No thyromegaly present.  Cardiovascular: Normal rate, regular rhythm, normal heart sounds and intact distal pulses.  Exam reveals no gallop.   Pulmonary/Chest: Effort normal and breath sounds normal. No respiratory distress. She has no wheezes. She has no rales.  No crackles  Abdominal: Soft. Bowel sounds are normal. She exhibits no distension, no abdominal bruit and no mass. There is no tenderness.  Musculoskeletal: She exhibits no edema.  Lymphadenopathy:    She has no cervical  adenopathy.  Neurological: She is alert. She has normal reflexes.  Skin: Skin is warm and dry. No rash noted.  Psychiatric:  Baseline odd affect Less paranoid than other visits           Assessment & Plan:   Problem List Items Addressed This Visit      Other   ELEVATED BLOOD PRESSURE WITHOUT DIAGNOSIS OF HYPERTENSION    Pt most likely does have HTN - but poss whitecoat  She is resistant to tx  Wants to work on lifestyle change (rev) and DASH diet handout given  Also rec OMRON cuff for home checks  Lab today  I do recommend bp medication for bp over 140 /90 -she is aware -handouts given        Relevant Orders   Comprehensive metabolic panel (Completed)   TSH (Completed)   Hyperlipidemia -  Primary    Lipid panel today- pt requests NMR lipo profile  She has declined medicine in the past  Diet varies - states not optimal lately but is planning to be better Rev low sat fat diet       Relevant Orders   NMR, lipoprofile   Vitamin D deficiency    Pt is taking D3 otc  Level today  No fractures Disc imp to bone and overall health       Relevant Orders   Vit D  25 hydroxy (rtn osteoporosis monitoring) (Completed)

## 2014-12-28 NOTE — Assessment & Plan Note (Signed)
Pt is taking D3 otc  Level today  No fractures Disc imp to bone and overall health

## 2014-12-28 NOTE — Assessment & Plan Note (Signed)
Lipid panel today- pt requests NMR lipo profile  She has declined medicine in the past  Diet varies - states not optimal lately but is planning to be better Rev low sat fat diet

## 2014-12-28 NOTE — Assessment & Plan Note (Signed)
Pt most likely does have HTN - but poss whitecoat  She is resistant to tx  Wants to work on lifestyle change (rev) and DASH diet handout given  Also rec OMRON cuff for home checks  Lab today  I do recommend bp medication for bp over 140 /90 -she is aware -handouts given

## 2014-12-28 NOTE — Patient Instructions (Signed)
We would like to see your blood pressure below 140/90  I will print out some handouts for you  My favorite blood pressure cuff is the OMRON for the arm (not the wrist)-always check blood pressure when relaxed today  Eat a healthy diet and do as much exercise as your knees will let you  Lab today-also for NMR lipo profile and vitamin D

## 2014-12-28 NOTE — Progress Notes (Signed)
Pre visit review using our clinic review tool, if applicable. No additional management support is needed unless otherwise documented below in the visit note. 

## 2015-01-02 LAB — CARDIO IQ(R) ADVANCED LIPID PANEL
Apolipoprotein B: 128 mg/dL — ABNORMAL HIGH (ref 49–103)
Cholesterol, Total: 274 mg/dL — ABNORMAL HIGH (ref 125–200)
Cholesterol/HDL Ratio: 2.6 calc (ref <=5.0)
HDL CHOLESTEROL (CARDIO IQ ADV LIPID PANEL): 104 mg/dL (ref >=46)
LDL CHOLESTEROL CALCULATED (CARDIO IQ ADV LIPID PANEL): 147 mg/dL — AB
LDL Large: 10467 nmol/L (ref 5038–17886)
LDL Medium: 316 nmol/L (ref 121–397)
LDL PEAK SIZE: 224.2 Angstrom (ref >=218.2)
LDL Particle Number: 2014 nmol/L (ref 1016–2185)
LDL Small: 300 nmol/L (ref 115–386)
NON-HDL CHOLESTEROL (CARDIO IQ ADV LIPID PANEL): 170 mg/dL
TRIGLYCERIDES (CARDIO IQ ADV LIPID PANEL): 115 mg/dL

## 2015-01-04 ENCOUNTER — Encounter: Payer: Self-pay | Admitting: *Deleted

## 2015-03-02 ENCOUNTER — Telehealth: Payer: Self-pay | Admitting: Family Medicine

## 2015-03-02 NOTE — Telephone Encounter (Signed)
Symptomatic care/fluids /rest  Saline nasal spray may help congestion  flonase nasal spray otc once daily may help ear and nasal congestion  mucinex for congestion and cough  If worse or fever or sob-f/u

## 2015-03-02 NOTE — Telephone Encounter (Signed)
Patient Name: DARIELA WINKLEPLECK DOB: [birth year] Initial Comment Caller states she has cough, runny nose and congestion, hard of hearing with ear congestion Nurse Assessment Nurse: Ronnald Ramp, RN, Miranda Date/Time (Eastern Time): 03/02/2015 12:09:31 PM Confirm and document reason for call. If symptomatic, describe symptoms. ---Caller states she is having congestion and productive cough. Also with ear congestion. Symptoms started Friday night. Has the patient traveled out of the country within the last 30 days? ---Not Applicable Does the patient have any new or worsening symptoms? ---Yes Will a triage be completed? ---Yes Related visit to physician within the last 2 weeks? ---No Does the PT have any chronic conditions? (i.e. diabetes, asthma, etc.) ---No Is this a behavioral health or substance abuse call? ---No Guidelines Guideline Title Affirmed Question Affirmed Notes Cough - Acute Productive Earache Final Disposition User See Physician within 24 Hours Jones, RN, Jeannetta Nap Comments refused appt because she said she was too contagious Gave care advice for ear congestion and sinus congestion

## 2015-03-02 NOTE — Telephone Encounter (Signed)
Called pt and no answer and no voicemail (kept ringing) 

## 2015-03-02 NOTE — Telephone Encounter (Signed)
PLEASE NOTE: All timestamps contained within this report are represented as [place] Standard Time. CONFIDENTIALTY NOTICE: This fax transmission is intended only for the addressee. It contains information that is legally privileged, confidential or otherwise protected from use or disclosure. If you are not the intended recipient, you are strictly prohibited from reviewing, disclosing, copying using or disseminating any of this information or taking any action in reliance on or regarding this information. If you have received this fax in error, please notify us immediately by telephone so that we can arrange for its return to [place]. Phone: [phone], Toll-Free: [phone], Fax: [phone] Page: 1 of 2 Call Id: [id number] [place] Patient Name: [name] Gender: Female DOB: 25-Apr-1940 Age: 74 Y 49 M 16 D Return Phone Number: [id number] (Primary) Address: City/State/Zip: [place] Client [facility] Client Client Site [place], [place] Contact Type Call Call Type Triage / Clinical Relationship To Patient Self Appointment Disposition EMR Patient Refused Appointment Info pasted into Epic Yes Return Phone Number [phone] (Primary) Chief Complaint Ear Fullness or Congestion Initial Comment Caller states she has cough, runny nose and congestion, hard of hearing with ear congestion PreDisposition Call Doctor Nurse Assessment Nurse: Ronnald Ramp, RN, Miranda Date/Time (Eastern Time): 03/02/2015 12:09:31 PM Confirm and document reason for call. If symptomatic, describe symptoms. ---Caller states she is having congestion and productive cough. Also with ear congestion. Symptoms started Friday night. Has the patient traveled out of the country within the last 30 days? ---Not Applicable Does the patient have any new or worsening  symptoms? ---Yes Will a triage be completed? ---Yes Related visit to physician within the last 2 weeks? ---No Does the PT have any chronic conditions? (i.e. diabetes, asthma, etc.) ---No Is this a behavioral health or substance abuse call? ---No Guidelines Guideline Title Affirmed Question Affirmed Notes Nurse Date/Time Eilene Ghazi Time) Cough - Acute Productive Lindaann Pascal, RN, Miranda 03/02/2015 12:11:07 PM Disp. Time Eilene Ghazi Time) Disposition Final User 03/02/2015 12:17:19 PM See Physician within 24 Hours Yes Ronnald Ramp, RN, Judge Stall Understands: Yes Disagree/Comply: Comply Care Advice Given Per Guideline PLEASE NOTE: All timestamps contained within this report are represented as [place] Standard Time. CONFIDENTIALTY NOTICE: This fax transmission is intended only for the addressee. It contains information that is legally privileged, confidential or otherwise protected from use or disclosure. If you are not the intended recipient, you are strictly prohibited from reviewing, disclosing, copying using or disseminating any of this information or taking any action in reliance on or regarding this information. If you have received this fax in error, please notify us immediately by telephone so that we can arrange for its return to [place]. Phone: [phone], Toll-Free: [phone], Fax: [phone] Page: 2 of 2 Call Id: [id number] Care Advice Given Per Guideline SEE PHYSICIAN WITHIN 24 HOURS: PAIN MEDICINES: ACETAMINOPHEN (E.G., TYLENOL): IBUPROFEN (E.G., MOTRIN, ADVIL): COUGH DROPS FOR COUGH: * Cough drops can help a lot, especially for mild coughs. They reduce coughing by soothing your irritated throat and removing that tickle sensation in the back of the throat. * Cough drops also have the advantage of portability - you can carry them with you. * Cough drops are available over-the-counter (OTC). HOME REMEDY - HARD CANDY: Hard candy works just as well as a medicine-flavored OTC cough drops.  * Drink warm fluids. Inhale warm mist. (Reason: both relax the airway and loosen up the phlegm) *  Suck on cough drops or hard candy to coat the irritated throat. COUGHING SPELLS: CALL BACK IF: * Difficulty breathing occurs * You become worse. CARE ADVICE given per Cough - Acute Productive (Adult) guideline. After Care Instructions Given Call Event Type User Date / Time Description Comments User: Leverne Humbles, RN Date/Time Eilene Ghazi Time): 03/02/2015 12:20:15 PM refused appt because she said she was too contagious User: Leverne Humbles, RN Date/Time Eilene Ghazi Time): 03/02/2015 12:22:15 PM Gave care advice for ear congestion and sinus congestion from those guidelines background information. Referrals GO TO FACILITY REFUSED

## 2015-03-05 NOTE — Telephone Encounter (Signed)
Called pt and no answer and no voicemail (kept ringing) [name]

## 2015-03-10 NOTE — Telephone Encounter (Signed)
Called pt 3rd time and no answer and no voicemail

## 2015-12-06 DIAGNOSIS — M170 Bilateral primary osteoarthritis of knee: Secondary | ICD-10-CM | POA: Insufficient documentation

## 2015-12-06 DIAGNOSIS — Z131 Encounter for screening for diabetes mellitus: Secondary | ICD-10-CM | POA: Diagnosis not present

## 2015-12-06 DIAGNOSIS — Z780 Asymptomatic menopausal state: Secondary | ICD-10-CM | POA: Diagnosis not present

## 2015-12-06 DIAGNOSIS — E785 Hyperlipidemia, unspecified: Secondary | ICD-10-CM | POA: Diagnosis not present

## 2015-12-06 DIAGNOSIS — Z0000 Encounter for general adult medical examination without abnormal findings: Secondary | ICD-10-CM | POA: Diagnosis not present

## 2015-12-27 DIAGNOSIS — Z780 Asymptomatic menopausal state: Secondary | ICD-10-CM | POA: Diagnosis not present

## 2016-01-03 DIAGNOSIS — R7989 Other specified abnormal findings of blood chemistry: Secondary | ICD-10-CM | POA: Insufficient documentation

## 2016-01-03 DIAGNOSIS — R946 Abnormal results of thyroid function studies: Secondary | ICD-10-CM | POA: Diagnosis not present

## 2016-01-03 DIAGNOSIS — M810 Age-related osteoporosis without current pathological fracture: Secondary | ICD-10-CM | POA: Diagnosis not present

## 2016-01-03 DIAGNOSIS — E7800 Pure hypercholesterolemia, unspecified: Secondary | ICD-10-CM | POA: Insufficient documentation

## 2016-03-28 DIAGNOSIS — R946 Abnormal results of thyroid function studies: Secondary | ICD-10-CM | POA: Diagnosis not present

## 2016-04-04 DIAGNOSIS — M170 Bilateral primary osteoarthritis of knee: Secondary | ICD-10-CM | POA: Diagnosis not present

## 2016-04-04 DIAGNOSIS — M810 Age-related osteoporosis without current pathological fracture: Secondary | ICD-10-CM | POA: Diagnosis not present

## 2016-04-04 DIAGNOSIS — Z780 Asymptomatic menopausal state: Secondary | ICD-10-CM | POA: Diagnosis not present

## 2016-04-04 DIAGNOSIS — E7800 Pure hypercholesterolemia, unspecified: Secondary | ICD-10-CM | POA: Diagnosis not present

## 2016-05-11 ENCOUNTER — Telehealth: Payer: Self-pay | Admitting: Family Medicine

## 2016-05-11 NOTE — Telephone Encounter (Signed)
LVM for pt to call back and schedule AWV + labs with Lesia and CPE with PCP. °

## 2016-06-05 NOTE — Telephone Encounter (Signed)
LVM for pt to call back and schedule AWV + labs with Lesia and CPE with PCP. °

## 2016-08-31 DIAGNOSIS — Z131 Encounter for screening for diabetes mellitus: Secondary | ICD-10-CM | POA: Diagnosis not present

## 2016-08-31 DIAGNOSIS — E7800 Pure hypercholesterolemia, unspecified: Secondary | ICD-10-CM | POA: Diagnosis not present

## 2016-08-31 DIAGNOSIS — L989 Disorder of the skin and subcutaneous tissue, unspecified: Secondary | ICD-10-CM | POA: Diagnosis not present

## 2016-08-31 DIAGNOSIS — M810 Age-related osteoporosis without current pathological fracture: Secondary | ICD-10-CM | POA: Diagnosis not present

## 2016-08-31 DIAGNOSIS — M170 Bilateral primary osteoarthritis of knee: Secondary | ICD-10-CM | POA: Diagnosis not present

## 2016-09-11 DIAGNOSIS — L578 Other skin changes due to chronic exposure to nonionizing radiation: Secondary | ICD-10-CM | POA: Diagnosis not present

## 2016-09-11 DIAGNOSIS — L700 Acne vulgaris: Secondary | ICD-10-CM | POA: Diagnosis not present

## 2016-09-11 DIAGNOSIS — C44319 Basal cell carcinoma of skin of other parts of face: Secondary | ICD-10-CM | POA: Diagnosis not present

## 2016-09-11 DIAGNOSIS — D485 Neoplasm of uncertain behavior of skin: Secondary | ICD-10-CM | POA: Diagnosis not present

## 2016-10-02 DIAGNOSIS — M170 Bilateral primary osteoarthritis of knee: Secondary | ICD-10-CM | POA: Diagnosis not present

## 2016-10-24 ENCOUNTER — Ambulatory Visit: Payer: Self-pay | Admitting: Family Medicine

## 2016-10-31 ENCOUNTER — Ambulatory Visit: Payer: Self-pay | Admitting: Family Medicine

## 2018-04-25 ENCOUNTER — Ambulatory Visit: Payer: Medicare PPO | Admitting: Family Medicine

## 2020-04-19 ENCOUNTER — Telehealth: Payer: Self-pay | Admitting: General Practice

## 2020-04-19 NOTE — Telephone Encounter (Signed)
I cannot take her at this time

## 2020-04-19 NOTE — Telephone Encounter (Signed)
Pt called in wanted to know about getting seen by Dr. Glori Bickers for pre-clearance surgery.

## 2020-04-19 NOTE — Telephone Encounter (Signed)
Pt returned call. I apologized to her for the inconvenience and told her that Dr. Glori Bickers is unable to see her. I explained that we recently lost a provider so we are still in the process of redistributing those patients to other providers in our office and that since she has not been seen since 2016, she is considered a new patient. She verbalized understanding. I gave her the information for other [facility] offices that may be able to see her. She states she will contact them to schedule an appointment.

## 2020-04-19 NOTE — Telephone Encounter (Signed)
Lvm asking pt to call office. Unfortunately, due to losing a provider, we are unable to see her in our office. We can give her information for the other [facility] that are taking new patients. If patient returns call, I will explain this to her.

## 2020-04-19 NOTE — Telephone Encounter (Signed)
Will route to Trudi Ida to discuss with pt

## 2020-04-19 NOTE — Telephone Encounter (Signed)
Hasn't been seen since 2016, and since then has cancelled and no-showed multiple appts, including a est. Care/ new pt appt with Dr. Einar Pheasant, I printed past appts, for Dr. Glori Bickers to review before deciding, appt history in your inbox

## 2020-04-22 ENCOUNTER — Ambulatory Visit: Payer: Medicare PPO | Admitting: Physician Assistant

## 2020-04-22 DIAGNOSIS — Z0289 Encounter for other administrative examinations: Secondary | ICD-10-CM

## 2020-04-30 ENCOUNTER — Telehealth: Payer: Self-pay

## 2020-04-30 NOTE — Telephone Encounter (Signed)
Called and left VM letting this person know that we are not currently accepting new pt's.  Okay for PEC to advise also if she calls back.

## 2020-04-30 NOTE — Telephone Encounter (Signed)
Copied from [place] [id number]. Topic: Appointment Scheduling - New Patient >> Apr 30, 2020  2:25 PM Tessa Lerner A wrote: Patient would like to be seen as a new patient of the practice  Patient would prefer to be seen by a female provider Please contact to advise

## 2020-05-07 NOTE — Progress Notes (Deleted)
   There were no vitals taken for this visit.   Subjective:    Patient ID: [name], female    DOB: [birth year], 80 y.o.   MRN: [id number]  HPI: [name] is a 80 y.o. female  No chief complaint on file.  Patient presents to clinic today to establish care with new PCP.  Patient has history of schizoeffective disorder, borderline personality disorder, hyperlipidemia, elevated BP without HTN, Vitamin D def., elevated TSH.  Patient would like to discuss    Relevant past medical, surgical, family and social history reviewed and updated as indicated. Interim medical history since our last visit reviewed. Allergies and medications reviewed and updated.  Review of Systems  Per HPI unless specifically indicated above     Objective:    There were no vitals taken for this visit.  Wt Readings from Last 3 Encounters:  12/28/14 172 lb 12 oz (78.4 kg)  11/04/13 168 lb (76.2 kg)  09/18/13 164 lb (74.4 kg)    Physical Exam  Results for orders placed or performed in visit on 12/28/14  Cardio IQ (R) Advanced Lipid Panel  Result Value Ref Range   LDL Particle Number 2,014 1,016 - 2,185 nmol/L   LDL Small 300 115 - 386 nmol/L   LDL Medium 316 121 - 397 nmol/L   LDL Large 10,467 5,038 - 17,886 nmol/L   LDL Pattern A A Pattern   LDL Peak Size 224.2 >=218.2 Angstrom   Cholesterol, Total 274 (H) 125 - 200 mg/dL   HDL Cholesterol 104 >=46 mg/dL   Triglycerides 115 mg/dL   LDL, Calculated 147 (H) mg/dL   Cholesterol/HDL Ratio 2.6 <=5.0 calc   Non-HDL Cholesterol 170 mg/dL   Apolipoprotein B 128 (H) 49 - 103 mg/dL   Lipoprotein (a) <10 <75 nmol/L      Assessment & Plan:   Problem List Items Addressed This Visit   None      Follow up plan: No follow-ups on file.

## 2020-05-10 ENCOUNTER — Ambulatory Visit: Payer: Medicare PPO | Admitting: Nurse Practitioner

## 2020-05-10 ENCOUNTER — Other Ambulatory Visit: Payer: Self-pay

## 2020-05-10 ENCOUNTER — Telehealth: Payer: Self-pay

## 2020-05-10 ENCOUNTER — Encounter: Payer: Self-pay | Admitting: Nurse Practitioner

## 2020-05-10 ENCOUNTER — Ambulatory Visit: Payer: Self-pay | Admitting: Nurse Practitioner

## 2020-05-10 VITALS — BP 176/76 | HR 71 | Temp 98.1°F | Ht 62.99 in | Wt 195.0 lb

## 2020-05-10 DIAGNOSIS — E559 Vitamin D deficiency, unspecified: Secondary | ICD-10-CM

## 2020-05-10 DIAGNOSIS — R7989 Other specified abnormal findings of blood chemistry: Secondary | ICD-10-CM

## 2020-05-10 DIAGNOSIS — M25562 Pain in left knee: Secondary | ICD-10-CM

## 2020-05-10 DIAGNOSIS — D649 Anemia, unspecified: Secondary | ICD-10-CM

## 2020-05-10 DIAGNOSIS — R030 Elevated blood-pressure reading, without diagnosis of hypertension: Secondary | ICD-10-CM

## 2020-05-10 DIAGNOSIS — E7800 Pure hypercholesterolemia, unspecified: Secondary | ICD-10-CM | POA: Diagnosis not present

## 2020-05-10 DIAGNOSIS — Z01818 Encounter for other preprocedural examination: Secondary | ICD-10-CM

## 2020-05-10 DIAGNOSIS — Z7689 Persons encountering health services in other specified circumstances: Secondary | ICD-10-CM

## 2020-05-10 DIAGNOSIS — M25561 Pain in right knee: Secondary | ICD-10-CM

## 2020-05-10 DIAGNOSIS — I10 Essential (primary) hypertension: Secondary | ICD-10-CM

## 2020-05-10 DIAGNOSIS — E782 Mixed hyperlipidemia: Secondary | ICD-10-CM

## 2020-05-10 DIAGNOSIS — G8929 Other chronic pain: Secondary | ICD-10-CM

## 2020-05-10 DIAGNOSIS — R011 Cardiac murmur, unspecified: Secondary | ICD-10-CM

## 2020-05-10 MED ORDER — LISINOPRIL 20 MG PO TABS: 20.0000 mg | ORAL_TABLET | Freq: Every day | ORAL | 0 refills | Status: DC

## 2020-05-10 NOTE — Assessment & Plan Note (Signed)
Patient has R knee replaced scheduled for Wednesday March 9 with Dr. Harlow Mares.  Called [facility] to request pre op information.

## 2020-05-10 NOTE — Assessment & Plan Note (Signed)
Labs ordered today

## 2020-05-10 NOTE — Assessment & Plan Note (Signed)
Chronic.  Labs ordered today.  Will treat based on lab results.

## 2020-05-10 NOTE — Telephone Encounter (Signed)
Copied from [place] [id number]. Topic: Appointment Scheduling - Scheduling Inquiry for Clinic >> May 10, 2020  9:52 AM Oneta Rack wrote: Reason for CRM:  patient lost her keys and is on her way she has a 10am NPA with PCP. Informed patient of late policy and she stated she needs this important because she has upcoming surgery and will wait.

## 2020-05-10 NOTE — Progress Notes (Signed)
BP (!) 176/76 (BP Location: Right Arm)   Pulse 71   Temp 98.1 F (36.7 C)   Ht 5' 2.99" (1.6 m)   Wt 195 lb (88.5 kg)   SpO2 99%   BMI 34.55 kg/m    Subjective:    Patient ID: [name], female    DOB: 1940/11/30, 80 y.o.   MRN: [id number]  HPI: [name] is a 80 y.o. female  Chief Complaint  Patient presents with  . Establish Care    Needs surgical clearance    Patient presents to clinic to establish care with new PCP.  Patient needs to have a surgical clearance to have her knee replaced.  Patient did not bring pre op clearance with her to visit today.  Denies history of hypertension, high cholesterol, diabetes, and thyroid concerns.  Patient does not know why her Ortho wanted her to have a PCP.  Dr. Harlow Mares- [facility]- will request Pre op clearance information.    Patient denies concerns at visit today.    Relevant past medical, surgical, family and social history reviewed and updated as indicated. Interim medical history since our last visit reviewed. Allergies and medications reviewed and updated.  Review of Systems  Eyes: Negative for visual disturbance.  Respiratory: Negative for cough, chest tightness and shortness of breath.   Cardiovascular: Negative for chest pain, palpitations and leg swelling.  Neurological: Negative for dizziness and headaches.    Per HPI unless specifically indicated above     Objective:    BP (!) 176/76 (BP Location: Right Arm)   Pulse 71   Temp 98.1 F (36.7 C)   Ht 5' 2.99" (1.6 m)   Wt 195 lb (88.5 kg)   SpO2 99%   BMI 34.55 kg/m   Wt Readings from Last 3 Encounters:  05/10/20 195 lb (88.5 kg)  12/28/14 172 lb 12 oz (78.4 kg)  11/04/13 168 lb (76.2 kg)    Physical Exam Vitals and nursing note reviewed.  Constitutional:      General: She is not in acute distress.    Appearance: Normal appearance. She is normal weight. She is not ill-appearing, toxic-appearing or diaphoretic.  HENT:     Head:  Normocephalic.     Right Ear: External ear normal.     Left Ear: External ear normal. There is no impacted cerumen.     Nose: Nose normal.     Mouth/Throat:     Mouth: Mucous membranes are moist.     Pharynx: Oropharynx is clear.  Eyes:     General:        Right eye: No discharge.        Left eye: No discharge.     Extraocular Movements: Extraocular movements intact.     Conjunctiva/sclera: Conjunctivae normal.     Pupils: Pupils are equal, round, and reactive to light.  Cardiovascular:     Rate and Rhythm: Normal rate and regular rhythm.     Heart sounds: Murmur heard.    Pulmonary:     Effort: Pulmonary effort is normal. No respiratory distress.     Breath sounds: Normal breath sounds. No wheezing or rales.  Musculoskeletal:     Cervical back: Normal range of motion and neck supple.  Skin:    General: Skin is warm and dry.     Capillary Refill: Capillary refill takes less than 2 seconds.  Neurological:     General: No focal deficit present.     Mental Status: She  is alert and oriented to person, place, and time. Mental status is at baseline.  Psychiatric:        Mood and Affect: Mood normal.        Behavior: Behavior normal.        Thought Content: Thought content normal.        Judgment: Judgment normal.     Results for orders placed or performed in visit on 12/28/14  Cardio IQ (R) Advanced Lipid Panel  Result Value Ref Range   LDL Particle Number 2,014 1,016 - 2,185 nmol/L   LDL Small 300 115 - 386 nmol/L   LDL Medium 316 121 - 397 nmol/L   LDL Large 10,467 5,038 - 17,886 nmol/L   LDL Pattern A A Pattern   LDL Peak Size 224.2 >=218.2 Angstrom   Cholesterol, Total 274 (H) 125 - 200 mg/dL   HDL Cholesterol 104 >=46 mg/dL   Triglycerides 115 mg/dL   LDL, Calculated 147 (H) mg/dL   Cholesterol/HDL Ratio 2.6 <=5.0 calc   Non-HDL Cholesterol 170 mg/dL   Apolipoprotein B 128 (H) 49 - 103 mg/dL   Lipoprotein (a) <10 <75 nmol/L      Assessment & Plan:   Problem  List Items Addressed This Visit      Cardiovascular and Mediastinum   Hypertension    Uncontrolled. Patient has not seen a PCP in 4-5 years.  Begin Lisinopril 20mg  daily.  Return to clinic in one week for reevaluation.       Relevant Medications   lisinopril (ZESTRIL) 20 MG tablet   Other Relevant Orders   Ambulatory referral to Cardiology     Other   Anemia    Chronic.  Labs ordered today.  Will treat based on lab results.       Relevant Orders   CBC w/Diff   Knee pain    Patient has R knee replaced scheduled for Wednesday March 9 with Dr. Harlow Mares.  Called [facility] to request pre op information.      Vitamin D deficiency    Labs ordered today.       Relevant Orders   Vitamin D (25 hydroxy)   Pure hypercholesterolemia    Labs ordered today.       Relevant Medications   lisinopril (ZESTRIL) 20 MG tablet   Other Relevant Orders   Lipid Profile   Elevated TSH    Labs ordered for evaluation today.       Relevant Orders   TSH   HgB A1c   RESOLVED: ELEVATED BLOOD PRESSURE WITHOUT DIAGNOSIS OF HYPERTENSION   Relevant Orders   Comprehensive metabolic panel   CBC w/Diff    Other Visit Diagnoses    Pre-operative clearance    -  Primary   Pre op clearance not performed at visit today due to elevated blood pressure, heart murmur of unknown origin, and no pre op order from Ortho.   Relevant Orders   Ambulatory referral to Cardiology   Murmur, cardiac       Referral to Cardiology placed.    Relevant Orders   Ambulatory referral to Cardiology   Encounter to establish care           Follow up plan: Return in about 1 week (around 05/17/2020) for BP Check and possible pre op clearance.

## 2020-05-10 NOTE — Assessment & Plan Note (Signed)
Labs ordered for evaluation today.

## 2020-05-10 NOTE — Assessment & Plan Note (Signed)
Uncontrolled. Patient has not seen a PCP in 4-5 years.  Begin Lisinopril 20mg  daily.  Return to clinic in one week for reevaluation.

## 2020-05-11 ENCOUNTER — Telehealth: Payer: Self-pay | Admitting: Nurse Practitioner

## 2020-05-11 LAB — CBC WITH DIFFERENTIAL/PLATELET
Basophils Absolute: 0 10*3/uL (ref 0.0–0.2)
Basos: 1 %
EOS (ABSOLUTE): 0.2 10*3/uL (ref 0.0–0.4)
Eos: 3 %
Hematocrit: 34.1 % (ref 34.0–46.6)
Hemoglobin: 10.1 g/dL — ABNORMAL LOW (ref 11.1–15.9)
Immature Grans (Abs): 0 10*3/uL (ref 0.0–0.1)
Immature Granulocytes: 0 %
Lymphocytes Absolute: 1.7 10*3/uL (ref 0.7–3.1)
Lymphs: 22 %
MCH: 21.9 pg — ABNORMAL LOW (ref 26.6–33.0)
MCHC: 29.6 g/dL — ABNORMAL LOW (ref 31.5–35.7)
MCV: 74 fL — ABNORMAL LOW (ref 79–97)
Monocytes Absolute: 0.7 10*3/uL (ref 0.1–0.9)
Monocytes: 9 %
Neutrophils Absolute: 5.1 10*3/uL (ref 1.4–7.0)
Neutrophils: 65 %
Platelets: 351 10*3/uL (ref 150–450)
RBC: 4.62 x10E6/uL (ref 3.77–5.28)
RDW: 17.8 % — ABNORMAL HIGH (ref 11.7–15.4)
WBC: 7.7 10*3/uL (ref 3.4–10.8)

## 2020-05-11 LAB — LIPID PANEL
Chol/HDL Ratio: 4 ratio (ref 0.0–4.4)
Cholesterol, Total: 267 mg/dL — ABNORMAL HIGH (ref 100–199)
HDL: 67 mg/dL (ref >39)
LDL Chol Calc (NIH): 173 mg/dL — ABNORMAL HIGH (ref 0–99)
Triglycerides: 148 mg/dL (ref 0–149)
VLDL Cholesterol Cal: 27 mg/dL (ref 5–40)

## 2020-05-11 LAB — HEMOGLOBIN A1C
Est. average glucose Bld gHb Est-mCnc: 120 mg/dL
Hgb A1c MFr Bld: 5.8 % — ABNORMAL HIGH (ref 4.8–5.6)

## 2020-05-11 LAB — VITAMIN D 25 HYDROXY (VIT D DEFICIENCY, FRACTURES): Vit D, 25-Hydroxy: 30 ng/mL (ref 30.0–100.0)

## 2020-05-11 LAB — TSH: TSH: 2.76 u[IU]/mL (ref 0.450–4.500)

## 2020-05-11 NOTE — Telephone Encounter (Signed)
Copied from [place] [phone]. Topic: Quick Communication - Lab Results (Clinic Use ONLY) >> May 11, 2020 10:32 AM Reel, Rexene Edison, CMA wrote: Called patient to inform them of  lab results. When patient returns call, triage nurse may disclose results. >> May 11, 2020  2:30 PM Celene Kras wrote: PT called back. Attempted to get a nurse on the line. Pt is requesting to have a call back. Please advise.

## 2020-05-11 NOTE — Progress Notes (Signed)
Please let patient know that her lab work shows that her cholesterol is elevated.  Due to the risk of hear attack or stroke, I recommend patient begin a cholesterol medication called Crestor.  We would start at a low dose and increase if patient tolerates the medication.  Patient's Thyroid and Vitamin D labs are within normal range.  There is evidence of an elevated A1c which is the measure we use for diabetes.  This indicates that patient is prediabetic.  There is no need for medication at this time.  It can be managed with a low carb diet.  Patient's blood counts shows that she is anemic.  When I see her on Monday, I will draw additional lab work to see the specific type of anemia and treat based off of that.  We can discuss all of this information at patient's appointment on Monday.  It was a pleasure meeting her and I look forward to our next visit.

## 2020-05-11 NOTE — Telephone Encounter (Signed)
See result notes. 

## 2020-05-11 NOTE — Telephone Encounter (Signed)
Copied from [place] [id number]. Topic: Quick Communication - Lab Results (Clinic Use ONLY) >> May 11, 2020 10:32 AM Reel, Rexene Edison, CMA wrote: Called patient to inform them of  lab results. When patient returns call, triage nurse may disclose results.

## 2020-05-12 MED ORDER — ROSUVASTATIN CALCIUM 5 MG PO TABS: 5.0000 mg | ORAL_TABLET | Freq: Every day | ORAL | 1 refills | Status: DC

## 2020-05-12 NOTE — Addendum Note (Signed)
Addended by: Jon Billings on: 05/12/2020 09:45 AM   Modules accepted: Orders

## 2020-05-12 NOTE — Progress Notes (Signed)
Medication sent to the pharmacy.

## 2020-05-17 ENCOUNTER — Ambulatory Visit: Payer: Medicare PPO | Admitting: Nurse Practitioner

## 2020-05-17 ENCOUNTER — Other Ambulatory Visit: Payer: Self-pay

## 2020-05-17 ENCOUNTER — Encounter: Payer: Self-pay | Admitting: Nurse Practitioner

## 2020-05-17 VITALS — BP 142/68 | HR 84 | Temp 98.5°F | Wt 193.2 lb

## 2020-05-17 DIAGNOSIS — R7303 Prediabetes: Secondary | ICD-10-CM

## 2020-05-17 DIAGNOSIS — Z01818 Encounter for other preprocedural examination: Secondary | ICD-10-CM | POA: Diagnosis not present

## 2020-05-17 DIAGNOSIS — E7800 Pure hypercholesterolemia, unspecified: Secondary | ICD-10-CM

## 2020-05-17 DIAGNOSIS — I10 Essential (primary) hypertension: Secondary | ICD-10-CM

## 2020-05-17 DIAGNOSIS — D649 Anemia, unspecified: Secondary | ICD-10-CM

## 2020-05-17 DIAGNOSIS — N390 Urinary tract infection, site not specified: Secondary | ICD-10-CM

## 2020-05-17 DIAGNOSIS — M170 Bilateral primary osteoarthritis of knee: Secondary | ICD-10-CM

## 2020-05-17 LAB — MICROSCOPIC EXAMINATION: RBC, Urine: NONE SEEN /hpf (ref 0–2)

## 2020-05-17 LAB — URINALYSIS, ROUTINE W REFLEX MICROSCOPIC
Bilirubin, UA: NEGATIVE
Glucose, UA: NEGATIVE
Nitrite, UA: NEGATIVE
RBC, UA: NEGATIVE
Specific Gravity, UA: 1.025 (ref 1.005–1.030)
Urobilinogen, Ur: 0.2 mg/dL (ref 0.2–1.0)
pH, UA: 6 (ref 5.0–7.5)

## 2020-05-17 MED ORDER — LISINOPRIL 20 MG PO TABS: 20.0000 mg | ORAL_TABLET | Freq: Every day | ORAL | 1 refills | Status: DC

## 2020-05-17 MED ORDER — NITROFURANTOIN MONOHYD MACRO 100 MG PO CAPS: 100.0000 mg | ORAL_CAPSULE | Freq: Two times a day (BID) | ORAL | 0 refills | Status: DC

## 2020-05-17 NOTE — Progress Notes (Signed)
BP (!) 142/68   Pulse 84   Temp 98.5 F (36.9 C)   Wt 193 lb 4 oz (87.7 kg)   SpO2 99%   BMI 34.24 kg/m    Subjective:    Patient ID: [name], female    DOB: 05-10-1940, 80 y.o.   MRN: [id number]  HPI: [name] is a 80 y.o. female  Chief Complaint  Patient presents with  . Pre-op Exam    Patient not cleared for surgery until Cardiac Clearance is received.   Patient is here today for pre op clearance for a Total knee replacement on 05/19/2020.  Patient presented to clinic last week to establish care after not having a PCP for 4-5 years.  Patient's lab work shows that she had hyperlipidemia and prediabetes.  She was started on Lisinopril 35m to help control her blood pressure.  Patient was sent to Cardiology for Pre op clearance due to heart murmur that patient was unaware of.   PREOP CLEARANCE  Type of Surgery: Intermediate, 1% - 5% cardiac risk (major intraabdominal, intrathoracic, orthopedic, major head & neck, prostatectomy)   Lee's Revised Cardiac Index: 0 Risk class I, very low, 0.4% risk of cardiac complications  Plan:  1. Patient requires endocarditis prophylaxis: no. 2. GENERAL PREOP INSTRUCTIONS: Proceed with surgery as planned. No food or liquids the morning of surgery. Call surgeon if develops respiratory illness, fever, or other illness. 3. Medications to Hold: NSAIDS for 7 days before surgery, unless instructed otherwise by surgeon. 4. EKG: Will require Cardiac Clearance- Pending at this time 6. Ordered Labs: CBC, CMP, A1c, Lipid, UA, Albumin  From a medical standpoint the patient is an acceptable candidates to undergo general anesthesia for surgery. It is recommended to correct electrolytes and to keep the patient euvolemic and avoid significant fluid shifts during surgery.  Labs reviewed and pt is cleared for surgery. Pt denies a hx of adverse reactions to anesthesia.  HYPERTENSION / HYPERLIPIDEMIA Satisfied with current  treatment? yes Duration of hypertension: years BP monitoring frequency: a few times a month BP range:  BP medication side effects: no Past BP meds: none.  Now on Lisinopril. Duration of hyperlipidemia: years Cholesterol medication side effects: no Cholesterol supplements: none Past cholesterol medications: none Medication compliance: good compliance Aspirin: no Recent stressors: no Recurrent headaches: no Visual changes: no Palpitations: no Dyspnea: no Chest pain: no Lower extremity edema: no Dizzy/lightheaded: no  Relevant past medical, surgical, family and social history reviewed and updated as indicated. Interim medical history since our last visit reviewed. Allergies and medications reviewed and updated. Subjective:   Patricia Wielandis a 80year old Female who presents to the office today for a preoperative consultation at the request of Dr. BHarlow Mares who will perform a  Right Total Knee replacement on Not sure of date due to being postponed.  Current Complaints: Knee Pain   Preoperative Risk Factors  1. Major predictors that require intensive management and may lead to delay in or cancellation of the operative procedure unless emergent:  No Unstable coronary syndromes including unstable or severe angina or recent MI  No Decompensated heart failure including NYHA functional class 4 or worsening or new onset HF  No Significant arrhythmia including high grade AV block, symptomatic ventricular arrhythmias, supraventricular arrhythmias with a ventricular rate >100 bpm at rest, symptomatic bradycardia, and newly recognized ventricular tachycardia  No Severe heart valve disease including severe aortic stenosis or symptomatic mitral stenosis  2, Additional independent predictors of major cardiac  complications:  No Hgh risk type of surgery (Vascular surgery and any open intraperitoneal or intrathoracic procedures)  Other clinical predictors that warrant careful assessment of  current status  No History of ischemic heart disease No History of CVA No History of compensated heart failure or prior heart failure No Diabetes mellitus on Insulin No Renal insufficiency  3. Perioperative cardiac and long term risk is increased in pts unable to meet a 4-MET demand during most normal daily activities:  Yes Ability to climb 2 flights of stairs or walk four blocks  Review of Systems  Per HPI unless specifically indicated above     Objective:    BP (!) 142/68   Pulse 84   Temp 98.5 F (36.9 C)   Wt 193 lb 4 oz (87.7 kg)   SpO2 99%   BMI 34.24 kg/m   Wt Readings from Last 3 Encounters:  05/17/20 193 lb 4 oz (87.7 kg)  05/10/20 195 lb (88.5 kg)  12/28/14 172 lb 12 oz (78.4 kg)    Physical Exam Vitals and nursing note reviewed.  Constitutional:      General: She is awake. She is not in acute distress.    Appearance: She is well-developed. She is not ill-appearing.  HENT:     Head: Normocephalic and atraumatic.     Right Ear: Hearing, tympanic membrane, ear canal and external ear normal. No drainage.     Left Ear: Hearing, tympanic membrane, ear canal and external ear normal. No drainage.     Nose: Nose normal.     Right Sinus: No maxillary sinus tenderness or frontal sinus tenderness.     Left Sinus: No maxillary sinus tenderness or frontal sinus tenderness.     Mouth/Throat:     Mouth: Mucous membranes are moist.     Pharynx: Oropharynx is clear. Uvula midline. No pharyngeal swelling, oropharyngeal exudate or posterior oropharyngeal erythema.  Eyes:     General: Lids are normal.        Right eye: No discharge.        Left eye: No discharge.     Extraocular Movements: Extraocular movements intact.     Conjunctiva/sclera: Conjunctivae normal.     Pupils: Pupils are equal, round, and reactive to light.     Visual Fields: Right eye visual fields normal and left eye visual fields normal.  Neck:     Thyroid: No thyromegaly.     Vascular: No carotid  bruit.     Trachea: Trachea normal.  Cardiovascular:     Rate and Rhythm: Normal rate and regular rhythm.     Heart sounds: Murmur heard.  No gallop.   Pulmonary:     Effort: Pulmonary effort is normal. No accessory muscle usage or respiratory distress.     Breath sounds: Normal breath sounds.  Chest:  Breasts:     Right: Normal. No axillary adenopathy or supraclavicular adenopathy.     Left: Normal. No axillary adenopathy or supraclavicular adenopathy.    Abdominal:     General: Bowel sounds are normal.     Palpations: Abdomen is soft. There is no hepatomegaly or splenomegaly.     Tenderness: There is no abdominal tenderness.  Musculoskeletal:        General: Normal range of motion.     Cervical back: Normal range of motion and neck supple.     Right lower leg: No edema.     Left lower leg: No edema.  Lymphadenopathy:     Head:       Right side of head: No submental, submandibular, tonsillar, preauricular or posterior auricular adenopathy.     Left side of head: No submental, submandibular, tonsillar, preauricular or posterior auricular adenopathy.     Cervical: No cervical adenopathy.     Upper Body:     Right upper body: No supraclavicular, axillary or pectoral adenopathy.     Left upper body: No supraclavicular, axillary or pectoral adenopathy.  Skin:    General: Skin is warm and dry.     Capillary Refill: Capillary refill takes less than 2 seconds.     Findings: No rash.  Neurological:     Mental Status: She is alert and oriented to person, place, and time.     Cranial Nerves: Cranial nerves are intact.     Gait: Gait is intact.     Deep Tendon Reflexes: Reflexes are normal and symmetric.     Reflex Scores:      Brachioradialis reflexes are 2+ on the right side and 2+ on the left side.      Patellar reflexes are 2+ on the right side and 2+ on the left side. Psychiatric:        Attention and Perception: Attention normal.        Mood and Affect: Mood normal.         Speech: Speech normal.        Behavior: Behavior normal. Behavior is cooperative.        Thought Content: Thought content normal.        Judgment: Judgment normal.     Results for orders placed or performed in visit on 05/17/20  Microscopic Examination   Urine  Result Value Ref Range   WBC, UA >30W 0 - 5 /hpf   RBC None seen 0 - 2 /hpf   Epithelial Cells (non renal) 0-10 0 - 10 /hpf   Mucus, UA Present (A) Not Estab.   Bacteria, UA Moderate (A) None seen/Few  Comp Met (CMET)  Result Value Ref Range   Glucose 81 65 - 99 mg/dL   BUN 21 8 - 27 mg/dL   Creatinine, Ser 0.91 0.57 - 1.00 mg/dL   eGFR 64 >59 mL/min/1.73   BUN/Creatinine Ratio 23 12 - 28   Sodium 141 134 - 144 mmol/L   Potassium 4.6 3.5 - 5.2 mmol/L   Chloride 104 96 - 106 mmol/L   CO2 21 20 - 29 mmol/L   Calcium 10.1 8.7 - 10.3 mg/dL   Total Protein 6.8 6.0 - 8.5 g/dL   Albumin 4.7 3.7 - 4.7 g/dL   Globulin, Total 2.1 1.5 - 4.5 g/dL   Albumin/Globulin Ratio 2.2 1.2 - 2.2   Bilirubin Total 0.4 0.0 - 1.2 mg/dL   Alkaline Phosphatase 68 44 - 121 IU/L   AST 22 0 - 40 IU/L   ALT 13 0 - 32 IU/L  Urinalysis, Routine w reflex microscopic  Result Value Ref Range   Specific Gravity, UA 1.025 1.005 - 1.030   pH, UA 6.0 5.0 - 7.5   Color, UA Yellow Yellow   Appearance Ur Cloudy (A) Clear   Leukocytes,UA 1+ (A) Negative   Protein,UA 2+ (A) Negative/Trace   Glucose, UA Negative Negative   Ketones, UA 1+ (A) Negative   RBC, UA Negative Negative   Bilirubin, UA Negative Negative   Urobilinogen, Ur 0.2 0.2 - 1.0 mg/dL   Nitrite, UA Negative Negative   Microscopic Examination See below:       Assessment & Plan:     Problem List Items Addressed This Visit      Cardiovascular and Mediastinum   Hypertension   Relevant Medications   lisinopril (ZESTRIL) 20 MG tablet   Other Relevant Orders   Comp Met (CMET) (Completed)     Other   Anemia   Pure hypercholesterolemia    Chronic, stable.  Patient started on Crestor  5mg after last visit due to elevated ASCVD risk score.  Will titrate up at future visits as patient tolerated medications.       Relevant Medications   lisinopril (ZESTRIL) 20 MG tablet   Prediabetes    Stable.  Results from last visit discussed with patient at visit today.  No medication changes at this time.  Patient educated on diet and exercise to help control prediabetes.        Other Visit Diagnoses    Pre-operative clearance    -  Primary   Relevant Orders   Comp Met (CMET) (Completed)   Albumin   Urinalysis   Urinalysis, Routine w reflex microscopic (Completed)   Acute UTI (urinary tract infection)       Relevant Medications   nitrofurantoin, macrocrystal-monohydrate, (MACROBID) 100 MG capsule   Other Relevant Orders   Urine Culture       Follow up plan: Return in about 6 months (around 11/17/2020) for HTN, HLD, DM2 FU.       

## 2020-05-17 NOTE — Progress Notes (Signed)
Please let patient know her urinalysis shows that she has an infection.  I would like her to come back and leave another urine sample so I can send it out for culture to make sure she in on the right antibiotic.  I will also send her an antibiotic to the pharmacy for her to pick up AFTER she leaves the urine sample.

## 2020-05-17 NOTE — Assessment & Plan Note (Signed)
Chronic, stable.  Patient started on Crestor 5mg  after last visit due to elevated ASCVD risk score.  Will titrate up at future visits as patient tolerated medications.

## 2020-05-17 NOTE — Assessment & Plan Note (Signed)
Stable.  Results from last visit discussed with patient at visit today.  No medication changes at this time.  Patient educated on diet and exercise to help control prediabetes.

## 2020-05-18 ENCOUNTER — Encounter: Payer: Self-pay | Admitting: Nurse Practitioner

## 2020-05-18 LAB — COMPREHENSIVE METABOLIC PANEL
ALT: 13 IU/L (ref 0–32)
AST: 22 IU/L (ref 0–40)
Albumin/Globulin Ratio: 2.2 (ref 1.2–2.2)
Albumin: 4.7 g/dL (ref 3.7–4.7)
Alkaline Phosphatase: 68 IU/L (ref 44–121)
BUN/Creatinine Ratio: 23 (ref 12–28)
BUN: 21 mg/dL (ref 8–27)
Bilirubin Total: 0.4 mg/dL (ref 0.0–1.2)
CO2: 21 mmol/L (ref 20–29)
Calcium: 10.1 mg/dL (ref 8.7–10.3)
Chloride: 104 mmol/L (ref 96–106)
Creatinine, Ser: 0.91 mg/dL (ref 0.57–1.00)
Globulin, Total: 2.1 g/dL (ref 1.5–4.5)
Glucose: 81 mg/dL (ref 65–99)
Potassium: 4.6 mmol/L (ref 3.5–5.2)
Sodium: 141 mmol/L (ref 134–144)
Total Protein: 6.8 g/dL (ref 6.0–8.5)
eGFR: 64 mL/min/{1.73_m2} (ref >59)

## 2020-05-18 NOTE — Assessment & Plan Note (Addendum)
Stable.  Well controlled.  HGB is 10.1.  Will continue to monitor.

## 2020-05-18 NOTE — Assessment & Plan Note (Signed)
Chronic.  Ongoing.  Controlled at visit today.  Continue with Lisinopril.  See Cardiology for evaluation of heart murmur.

## 2020-05-18 NOTE — Progress Notes (Signed)
Please let [name] know that her lab work from yesterday is within normal limits.  She would like a copy of this mailed to her.  Follow up as discussed in the visit.

## 2020-05-18 NOTE — Assessment & Plan Note (Signed)
Patient scheduled to have right total knee replacement with Dr. Harlow Mares.

## 2020-05-21 ENCOUNTER — Ambulatory Visit: Payer: Medicare PPO | Admitting: Cardiology

## 2020-05-21 ENCOUNTER — Other Ambulatory Visit: Payer: Self-pay

## 2020-05-21 ENCOUNTER — Encounter: Payer: Self-pay | Admitting: Cardiology

## 2020-05-21 ENCOUNTER — Telehealth: Payer: Self-pay | Admitting: Cardiology

## 2020-05-21 VITALS — BP 134/60 | HR 74 | Ht 63.0 in | Wt 193.0 lb

## 2020-05-21 DIAGNOSIS — I10 Essential (primary) hypertension: Secondary | ICD-10-CM

## 2020-05-21 DIAGNOSIS — Z01818 Encounter for other preprocedural examination: Secondary | ICD-10-CM

## 2020-05-21 DIAGNOSIS — R011 Cardiac murmur, unspecified: Secondary | ICD-10-CM

## 2020-05-21 DIAGNOSIS — E7800 Pure hypercholesterolemia, unspecified: Secondary | ICD-10-CM

## 2020-05-21 NOTE — Patient Instructions (Signed)
Medication Instructions:   Your physician recommends that you continue on your current medications as directed. Please refer to the Current Medication list given to you today.  *If you need a refill on your cardiac medications before your next appointment, please call your pharmacy*   Lab Work: None ordered If you have labs (blood work) drawn today and your tests are completely normal, you will receive your results only by: . MyChart Message (if you have MyChart) OR . A paper copy in the mail If you have any lab test that is abnormal or we need to change your treatment, we will call you to review the results.   Testing/Procedures:  1.   Your physician has requested that you have an echocardiogram. Echocardiography is a painless test that uses sound waves to create images of your heart. It provides your doctor with information about the size and shape of your heart and how well your heart's chambers and valves are working. This procedure takes approximately one hour. There are no restrictions for this procedure.  2.  [facility] MYOVIEW       Your caregiver has ordered a Stress Test with nuclear imaging. The purpose of this test is to evaluate the blood supply to your heart muscle. This procedure is referred to as a "Non-Invasive Stress Test." This is because other than having an IV started in your vein, nothing is inserted or "invades" your body. Cardiac stress tests are done to find areas of poor blood flow to the heart by determining the extent of coronary artery disease (CAD). Some patients exercise on a treadmill, which naturally increases the blood flow to your heart, while others who are  unable to walk on a treadmill due to physical limitations have a pharmacologic/chemical stress agent called Lexiscan . This medicine will mimic walking on a treadmill by temporarily increasing your coronary blood flow.      PLEASE REPORT TO [facility]   THE VOLUNTEERS AT THE FIRST DESK WILL  DIRECT YOU WHERE TO GO     *Please note: these test may take anywhere between 2-4 hours to complete       Date of Procedure:_____________________________________   Arrival Time for Procedure:______________________________    PLEASE NOTIFY THE OFFICE AT LEAST 24 HOURS IN ADVANCE IF YOU ARE UNABLE TO KEEP YOUR APPOINTMENT.  [phone]  PLEASE NOTIFY NUCLEAR MEDICINE AT [facility] AT LEAST 24 HOURS IN ADVANCE IF YOU ARE UNABLE TO KEEP YOUR APPOINTMENT. [phone]         How to prepare for your Myoview test:     1. Do not eat or drink after midnight  2. No caffeine for 24 hours prior to test  3. No smoking 24 hours prior to test.  4. Unless instructed otherwise, Take your medication with a small sips of water.    5.         Ladies, please do not wear dresses. Skirts or pants are appropriate. Please wear a short sleeve shirt.  6. No perfume, cologne or lotion.  7. Wear comfortable walking shoes. No heels!       Follow-Up: At [facility], you and your health needs are our priority.  As part of our continuing mission to provide you with exceptional heart care, we have created designated Provider Care Teams.  These Care Teams include your primary Cardiologist (physician) and Advanced Practice Providers (APPs -  Physician Assistants and Nurse Practitioners) who all work together to provide you with the care you need,   when you need it.  We recommend signing up for the patient portal called "MyChart".  Sign up information is provided on this After Visit Summary.  MyChart is used to connect with patients for Virtual Visits (Telemedicine).  Patients are able to view lab/test results, encounter notes, upcoming appointments, etc.  Non-urgent messages can be sent to your provider as well.   To learn more about what you can do with MyChart, go to [email or web address].    Your next appointment:   Follow up after testing   The format for your next appointment:   In Person  Provider:    Brian Agbor-Etang, MD   Other Instructions   

## 2020-05-21 NOTE — Progress Notes (Signed)
Cardiology Office Note:    Date:  05/21/2020   ID:  [name], DOB [birth year], MRN [id number]  PCP:  Jon Billings, NP   [place] [facility]  Cardiologist:  Kate Sable, MD  Advanced Practice Provider:  No care team member to display Electrophysiologist:  None       Referring MD: Jon Billings, NP   Chief Complaint  Patient presents with  . New Patient (Initial Visit)    Referred by PCP for Murmur, HTN, and cardiac Clearance. Meds reviewed verbally with patient.     History of Present Illness:    [name] is a 80 y.o. female with a hx of hyperlipidemia, hypertension, former smoker who presents due to cardiac murmur and preop eval.  Patient has a history of bilateral osteoarthritis.  Knee surgery is being planned.  Preop exam revealed a cardiac murmur.  Patient denies chest pain or shortness of breath.  Mobility is limited by knee pain.  Denies any personal history of heart disease.  Denies any cardiac symptoms.  Past Medical History:  Diagnosis Date  . Colon polyp   . History of small bowel obstruction 9/09   likely due to adhesions- pt refused most dx and tx in hospital  . History of vaginal bleeding    post menopausal- gyn eval, ? polyp  . Hyperlipidemia   . Osteoarthritis    knee  . Schizoaffective disorder    paranoid personality- refuses treatment  . Vitamin D deficiency     Past Surgical History:  Procedure Laterality Date  . ANTERIOR AND POSTERIOR REPAIR N/A 04/23/2012   Procedure: ANTERIOR   REPAIR CYSTOCELE;  Surgeon: Reece Packer, MD;  Location: [facility];  Service: Urology;  Laterality: N/A;  cysto;graft 10x6  . APPENDECTOMY    . bladder tack  1976  . KNEE ARTHROSCOPY     right  . LAPAROSCOPY  1970's   twisted fallopian tube  . SALPINGOOPHORECTOMY Bilateral 04/23/2012   Procedure: SALPINGO OOPHORECTOMY;  Surgeon: Emily Filbert, MD;  Location: [facility];  Service: Gynecology;  Laterality: Bilateral;  .  TONSILLECTOMY    . VAGINAL HYSTERECTOMY N/A 04/23/2012   Procedure: HYSTERECTOMY VAGINAL;  Surgeon: Emily Filbert, MD;  Location: [facility];  Service: Gynecology;  Laterality: N/A;  . VAGINAL PROLAPSE REPAIR N/A 04/23/2012   Procedure: VAGINAL VAULT SUSPENSION;  Surgeon: Reece Packer, MD;  Location: [facility];  Service: Urology;  Laterality: N/A;    Current Medications: Current Meds  Medication Sig  . Aspirin-Acetaminophen-Caffeine (GOODYS EXTRA STRENGTH PO) Take by mouth.  Marland Kitchen BIOTIN PO Take 1 capsule by mouth daily.  . cholecalciferol (VITAMIN D) 400 UNITS TABS tablet Take 1 tablet (400 Units total) by mouth daily.  Marland Kitchen lisinopril (ZESTRIL) 20 MG tablet Take 1 tablet (20 mg total) by mouth daily.  Marland Kitchen OVER THE COUNTER MEDICATION Kyolic, Bioastin, Stem cell restore,  . rosuvastatin (CRESTOR) 5 MG tablet Take 1 tablet (5 mg total) by mouth daily.  . vitamin E 100 UNIT capsule Take by mouth daily.  . Zinc 50 MG CAPS Take 50 mg by mouth daily.     Allergies:   Penicillins   Social History   Socioeconomic History  . Marital status: Widowed    Spouse name: Not on file  . Number of children: 3  . Years of education: Not on file  . Highest education level: Not on file  Occupational History  . Occupation: retired Pharmacist, hospital  Tobacco Use  . Smoking  status: Former Smoker    Packs/day: 0.50    Types: Cigarettes    Quit date: 03/13/1982    Years since quitting: 38.2  . Smokeless tobacco: Never Used  Vaping Use  . Vaping Use: Never used  Substance and Sexual Activity  . Alcohol use: No  . Drug use: No  . Sexual activity: Yes    Birth control/protection: Post-menopausal  Other Topics Concern  . Not on file  Social History Narrative   Lives alone   Social Determinants of Health   Financial Resource Strain: Not on file  Food Insecurity: Not on file  Transportation Needs: Not on file  Physical Activity: Not on file  Stress: Not on file  Social Connections: Not on file     Family  History: The patient's family history includes Arthritis in her brother and daughter; Cancer in her son; Diabetes in her brother, father, and mother; Heart disease in her father and mother; Hyperlipidemia in her brother; Hypertension in her brother and father; Obesity in her daughter.  ROS:   Please see the history of present illness.     All other systems reviewed and are negative.  EKGs/Labs/Other Studies Reviewed:    The following studies were reviewed today:   EKG:  EKG is  ordered today.  The ekg ordered today demonstrates normal sinus rhythm  Recent Labs: 05/10/2020: Hemoglobin 10.1; Platelets 351; TSH 2.760 05/17/2020: ALT 13; BUN 21; Creatinine, Ser 0.91; Potassium 4.6; Sodium 141  Recent Lipid Panel    Component Value Date/Time   CHOL 267 (H) 05/10/2020 1150   CHOL 274 (H) 12/28/2014 1057   TRIG 148 05/10/2020 1150   TRIG 115 12/28/2014 1057   HDL 67 05/10/2020 1150   HDL 104 12/28/2014 1057   CHOLHDL 4.0 05/10/2020 1150   CHOLHDL 2.6 12/28/2014 1057   CHOLHDL 3 08/02/2012 1343   VLDL 20.8 08/02/2012 1343   LDLCALC 173 (H) 05/10/2020 1150   LDLCALC 147 (H) 12/28/2014 1057   LDLDIRECT 143.7 08/02/2012 1343     Risk Assessment/Calculations:      Physical Exam:    VS:  BP 134/60 (BP Location: Right Arm, Patient Position: Sitting, Cuff Size: Normal)   Pulse 74   Ht 5\' 3"  (1.6 m)   Wt 193 lb (87.5 kg)   SpO2 98%   BMI 34.19 kg/m     Wt Readings from Last 3 Encounters:  05/21/20 193 lb (87.5 kg)  05/17/20 193 lb 4 oz (87.7 kg)  05/10/20 195 lb (88.5 kg)     GEN:  Well nourished, well developed in no acute distress HEENT: Normal NECK: No JVD; No carotid bruits LYMPHATICS: No lymphadenopathy CARDIAC: RRR, 2/6 systolic murmur RESPIRATORY:  Clear to auscultation without rales, wheezing or rhonchi  ABDOMEN: Soft, non-tender, non-distended MUSCULOSKELETAL:  No edema; No deformity  SKIN: Warm and dry NEUROLOGIC:  Alert and oriented x 3 PSYCHIATRIC:  Normal  affect   ASSESSMENT:    1. Pre-op evaluation   2. Systolic murmur   3. Primary hypertension   4. Pure hypercholesterolemia    PLAN:    In order of problems listed above:  1. Preop eval, knee surgery.  Systolic murmur noted on exam, risk factors of heart disease including age, hypertension, hyperlipidemia, former smoker x20 years.  Get echocardiogram and Myoview. 2. Systolic murmur, echocardiogram to evaluate valvular dysfunction. 3. Hypertension, BP controlled.  Continue current BP meds. 4. Hyperlipidemia, continue statin.  Follow-up after echo and Myoview.   Shared Decision Making/Informed Consent The  risks [chest pain, shortness of breath, cardiac arrhythmias, dizziness, blood pressure fluctuations, myocardial infarction, stroke/transient ischemic attack, nausea, vomiting, allergic reaction, radiation exposure, metallic taste sensation and life-threatening complications (estimated to be 1 in 10,000)], benefits (risk stratification, diagnosing coronary artery disease, treatment guidance) and alternatives of a nuclear stress test were discussed in detail with Ms. Puccini and she agrees to proceed.       Medication Adjustments/Labs and Tests Ordered: Current medicines are reviewed at length with the patient today.  Concerns regarding medicines are outlined above.  Orders Placed This Encounter  Procedures  . NM Myocar Multi W/Spect W/Wall Motion / EF  . EKG 12-Lead  . ECHOCARDIOGRAM COMPLETE   No orders of the defined types were placed in this encounter.   Patient Instructions  Medication Instructions:   Your physician recommends that you continue on your current medications as directed. Please refer to the Current Medication list given to you today. *If you need a refill on your cardiac medications before your next appointment, please call your pharmacy*   Lab Work: None ordered If you have labs (blood work) drawn today and your tests are completely normal, you will  receive your results only by: Marland Kitchen MyChart Message (if you have MyChart) OR . A paper copy in the mail If you have any lab test that is abnormal or we need to change your treatment, we will call you to review the results.   Testing/Procedures:  1.  Your physician has requested that you have an echocardiogram. Echocardiography is a painless test that uses sound waves to create images of your heart. It provides your doctor with information about the size and shape of your heart and how well your heart's chambers and valves are working. This procedure takes approximately one hour. There are no restrictions for this procedure.  2.  [place]       Your caregiver has ordered a Stress Test with nuclear imaging. The purpose of this test is to evaluate the blood supply to your heart muscle. This procedure is referred to as a "Non-Invasive Stress Test." This is because other than having an IV started in your vein, nothing is inserted or "invades" your body. Cardiac stress tests are done to find areas of poor blood flow to the heart by determining the extent of coronary artery disease (CAD). Some patients exercise on a treadmill, which naturally increases the blood flow to your heart, while others who are  unable to walk on a treadmill due to physical limitations have a pharmacologic/chemical stress agent called Lexiscan . This medicine will mimic walking on a treadmill by temporarily increasing your coronary blood flow.      PLEASE REPORT TO [facility] MEDICAL MALL ENTRANCE   THE VOLUNTEERS AT THE FIRST DESK WILL DIRECT YOU WHERE TO GO     *Please note: these test may take anywhere between 2-4 hours to complete       Date of Procedure:_____________________________________   Arrival Time for Procedure:______________________________    PLEASE NOTIFY THE OFFICE AT LEAST 24 HOURS IN ADVANCE IF YOU ARE UNABLE TO KEEP YOUR APPOINTMENT.  [place] 24 HOURS  IN ADVANCE IF YOU ARE UNABLE TO KEEP YOUR APPOINTMENT. [phone]         How to prepare for your Myoview test:    1. Do not eat or drink after midnight  2. No caffeine for 24 hours prior to test  3. No smoking 24 hours prior  to test.  4. Unless instructed otherwise, Take your medication with a small sips of water.    5.         Ladies, please do not wear dresses. Skirts or pants are appropriate. Please wear a short sleeve shirt.  6. No perfume, cologne or lotion.  7. Wear comfortable walking shoes. No heels!     Follow-Up: At [facility], you and your health needs are our priority.  As part of our continuing mission to provide you with exceptional heart care, we have created designated Provider Care Teams.  These Care Teams include your primary Cardiologist (physician) and Advanced Practice Providers (APPs -  Physician Assistants and Nurse Practitioners) who all work together to provide you with the care you need, when you need it.  We recommend signing up for the patient portal called "MyChart".  Sign up information is provided on this After Visit Summary.  MyChart is used to connect with patients for Virtual Visits (Telemedicine).  Patients are able to view lab/test results, encounter notes, upcoming appointments, etc.  Non-urgent messages can be sent to your provider as well.   To learn more about what you can do with MyChart, go to [email or web address].    Your next appointment:   Follow up after testing   The format for your next appointment:   In Person  Provider:   Kate Sable, MD   Other Instructions      Signed, Kate Sable, MD  05/21/2020 5:06 PM    [employer]

## 2020-05-21 NOTE — Telephone Encounter (Signed)
FYI patient declined fu If testing is normal.

## 2020-05-27 ENCOUNTER — Telehealth: Payer: Self-pay | Admitting: Physician Assistant

## 2020-05-27 ENCOUNTER — Ambulatory Visit
Admission: RE | Admit: 2020-05-27 | Discharge: 2020-05-27 | Disposition: A | Discharge status: Home / Self Care | Payer: Medicare PPO | Source: Ambulatory Visit | Attending: Cardiology | Admitting: Cardiology

## 2020-05-27 ENCOUNTER — Other Ambulatory Visit: Payer: Self-pay

## 2020-05-27 ENCOUNTER — Encounter
Admission: RE | Admit: 2020-05-27 | Discharge: 2020-05-27 | Disposition: A | Discharge status: Home / Self Care | Payer: Medicare PPO | Source: Ambulatory Visit | Attending: Cardiology | Admitting: Cardiology

## 2020-05-27 DIAGNOSIS — R011 Cardiac murmur, unspecified: Secondary | ICD-10-CM

## 2020-05-27 DIAGNOSIS — I700 Atherosclerosis of aorta: Secondary | ICD-10-CM | POA: Insufficient documentation

## 2020-05-27 DIAGNOSIS — Z01818 Encounter for other preprocedural examination: Secondary | ICD-10-CM | POA: Insufficient documentation

## 2020-05-27 DIAGNOSIS — I2510 Atherosclerotic heart disease of native coronary artery without angina pectoris: Secondary | ICD-10-CM | POA: Insufficient documentation

## 2020-05-27 DIAGNOSIS — Z01810 Encounter for preprocedural cardiovascular examination: Secondary | ICD-10-CM | POA: Diagnosis not present

## 2020-05-27 MED ORDER — TECHNETIUM TC 99M TETROFOSMIN IV KIT
10.0000 | PACK | Freq: Once | INTRAVENOUS | Status: AC | PRN
  Administered 2020-05-27: 10.89 via INTRAVENOUS

## 2020-05-27 NOTE — Telephone Encounter (Signed)
She presented for Lexiscan MPI today. BP was elevated in the 200s to 220s mmHg. She is only on lisinopril 20 mg daily and typically takes this at 2 PM. I have instructed her to take her lisinopril 20 mg when she gets home and take her regular 20 mg this afternoon like she usually does. Her elevated BP is possibly in the setting of poor sleep last night and anxiety surrounding her MPI. Stress test will need to be rescheduled.

## 2020-05-28 ENCOUNTER — Ambulatory Visit (INDEPENDENT_AMBULATORY_CARE_PROVIDER_SITE_OTHER): Payer: Medicare PPO

## 2020-05-28 ENCOUNTER — Ambulatory Visit
Admission: RE | Admit: 2020-05-28 | Discharge: 2020-05-28 | Disposition: A | Discharge status: Home / Self Care | Payer: Medicare PPO | Source: Ambulatory Visit | Attending: Cardiology | Admitting: Cardiology

## 2020-05-28 ENCOUNTER — Telehealth: Payer: Self-pay

## 2020-05-28 ENCOUNTER — Other Ambulatory Visit: Payer: Self-pay

## 2020-05-28 DIAGNOSIS — R011 Cardiac murmur, unspecified: Secondary | ICD-10-CM | POA: Diagnosis not present

## 2020-05-28 DIAGNOSIS — Z01818 Encounter for other preprocedural examination: Secondary | ICD-10-CM | POA: Diagnosis not present

## 2020-05-28 LAB — NM MYOCAR MULTI W/SPECT W/WALL MOTION / EF
LV dias vol: 60 mL (ref 46–106)
LV sys vol: 18 mL
Peak HR: 86 {beats}/min
Percent HR: 61 %
Rest HR: 68 {beats}/min
SDS: 6
SRS: 14
SSS: 17
TID: 0.88

## 2020-05-28 LAB — ECHOCARDIOGRAM COMPLETE
AR max vel: 1.32 cm2
AV Area VTI: 1.26 cm2
AV Area mean vel: 1.21 cm2
AV Mean grad: 14.5 mmHg
AV Peak grad: 23.6 mmHg
Ao pk vel: 2.43 m/s
Area-P 1/2: 2.63 cm2
Calc EF: 51.9 %
S' Lateral: 2.8 cm
Single Plane A2C EF: 53.9 %
Single Plane A4C EF: 50 %

## 2020-05-28 MED ORDER — TECHNETIUM TC 99M TETROFOSMIN IV KIT
30.0000 | PACK | Freq: Once | INTRAVENOUS | Status: AC | PRN
  Administered 2020-05-28: 28.09 via INTRAVENOUS

## 2020-05-28 MED ORDER — REGADENOSON 0.4 MG/5ML IV SOLN
0.4000 mg | Freq: Once | INTRAVENOUS | Status: AC
  Administered 2020-05-28: 0.4 mg via INTRAVENOUS

## 2020-05-28 NOTE — Telephone Encounter (Signed)
Spoke to patient and gave her test results as noted below. Patient requested I mail her a copy of her results, verified address on file. Will also route to PCP as patient is cleared for surgery.    Patricia Sable, MD  Kavin Leech, RN [place] [facility] Myoview is normal, low risk study, no evidence for ischemia.    Patient can undergo surgical procedure from a cardiac perspective at acceptable risk. No additional testing required. Thank you   Agbor-Etang, Aaron Edelman, MD  Kavin Leech, RN Echocardiogram shows normal systolic function, mild LVH likely from hypertension. There is some calcium in the aortic valve causing mild stenosis. This is likely the reason for patient's heart murmur. With mild aortic valve stenosis, no intervention or additional testing is needed. Periodic monitoring with serial echoes every 3 to 5 years as per guidelines.     it will be okay to proceed with surgery from a cardiac perspective.

## 2020-05-31 ENCOUNTER — Telehealth: Payer: Self-pay | Admitting: Nurse Practitioner

## 2020-05-31 NOTE — Telephone Encounter (Signed)
Please let patient know that I have received her Cardiac clearance.  We will fax her information over to her orthopedic office.   Can we send my note and the Cardiology note over to her Ortho office.

## 2020-05-31 NOTE — Telephone Encounter (Signed)
Per progress notes from Jon Billings, NP and Cardiologist notes along with the Cardiac Clearance was placed in folder in to be completed by provider.

## 2020-06-01 NOTE — Progress Notes (Signed)
BP (!) 152/67   Pulse 72   Temp 98.2 F (36.8 C)   SpO2 100%    Subjective:    Patient ID: [name], female    DOB: Nov 25, 1940, 80 y.o.   MRN: [id number]  HPI: [name] is a 80 y.o. female  Chief Complaint  Patient presents with  . Results   Patient here today to discuss results from previous visit.  Patient is confused about what is abnormal on her urinalysis. She did not return to the clinic to do the urine culture.  She is concerned about her surgery being held up due to the abnormal labs. Denies symptoms of a UTI.  Denies fever, pain with urination, back pain, frequency, urgency, blood in her urine, urgency, and decreased urine.   Relevant past medical, surgical, family and social history reviewed and updated as indicated. Interim medical history since our last visit reviewed. Allergies and medications reviewed and updated.  Review of Systems  Constitutional: Negative for fever.  Gastrointestinal: Negative for abdominal pain and vomiting.  Genitourinary: Negative for decreased urine volume, dysuria, flank pain, frequency, hematuria and urgency.  Musculoskeletal: Negative for back pain.    Per HPI unless specifically indicated above     Objective:    BP (!) 152/67   Pulse 72   Temp 98.2 F (36.8 C)   SpO2 100%   Wt Readings from Last 3 Encounters:  05/21/20 193 lb (87.5 kg)  05/17/20 193 lb 4 oz (87.7 kg)  05/10/20 195 lb (88.5 kg)    Physical Exam Vitals and nursing note reviewed.  Constitutional:      General: She is not in acute distress.    Appearance: Normal appearance. She is normal weight. She is not ill-appearing, toxic-appearing or diaphoretic.  HENT:     Head: Normocephalic.     Right Ear: External ear normal.     Left Ear: External ear normal.     Nose: Nose normal.     Mouth/Throat:     Mouth: Mucous membranes are moist.     Pharynx: Oropharynx is clear.  Eyes:     General:        Right eye: No discharge.         Left eye: No discharge.     Extraocular Movements: Extraocular movements intact.     Conjunctiva/sclera: Conjunctivae normal.     Pupils: Pupils are equal, round, and reactive to light.  Cardiovascular:     Rate and Rhythm: Normal rate and regular rhythm.     Heart sounds: No murmur heard.   Pulmonary:     Effort: Pulmonary effort is normal. No respiratory distress.     Breath sounds: Normal breath sounds. No wheezing or rales.  Musculoskeletal:     Cervical back: Normal range of motion and neck supple.  Skin:    General: Skin is warm and dry.     Capillary Refill: Capillary refill takes less than 2 seconds.  Neurological:     General: No focal deficit present.     Mental Status: She is alert and oriented to person, place, and time. Mental status is at baseline.  Psychiatric:        Mood and Affect: Mood normal.        Behavior: Behavior normal.        Thought Content: Thought content normal.        Judgment: Judgment normal.     Results for orders placed or performed in visit on  05/28/20  ECHOCARDIOGRAM COMPLETE  Result Value Ref Range   AR max vel 1.32 cm2   AV Peak grad 23.6 mmHg   Ao pk vel 2.43 m/s   S' Lateral 2.80 cm   Area-P 1/2 2.63 cm2   AV Area VTI 1.26 cm2   AV Mean grad 14.5 mmHg   Single Plane A4C EF 50.0 %   Single Plane A2C EF 53.9 %   Calc EF 51.9 %   AV Area mean vel 1.21 cm2      Assessment & Plan:   Problem List Items Addressed This Visit   None   Visit Diagnoses    Acute UTI (urinary tract infection)    -  Primary   Complete course of antibiotics.  Will send UC. Side effects and benefits discussed with patient during visit.  RTC is symptoms worsen or fail to improve.   Relevant Medications   nitrofurantoin, macrocrystal-monohydrate, (MACROBID) 100 MG capsule   Abnormal urinalysis       Repeat UA and send UC. Will treat based on lab results.    Relevant Orders   Urinalysis, Routine w reflex microscopic   Urine Culture       Follow up  plan: Return if symptoms worsen or fail to improve.   A total of 20 minutes were spent on this encounter today.  When total time is documented, this includes both the face-to-face and non-face-to-face time personally spent before, during and after the visit on the date of the encounter.

## 2020-06-02 ENCOUNTER — Encounter: Payer: Self-pay | Admitting: Nurse Practitioner

## 2020-06-02 ENCOUNTER — Other Ambulatory Visit: Payer: Self-pay

## 2020-06-02 ENCOUNTER — Ambulatory Visit: Payer: Medicare PPO | Admitting: Nurse Practitioner

## 2020-06-02 VITALS — BP 152/67 | HR 72 | Temp 98.2°F

## 2020-06-02 DIAGNOSIS — R8290 Unspecified abnormal findings in urine: Secondary | ICD-10-CM

## 2020-06-02 DIAGNOSIS — N390 Urinary tract infection, site not specified: Secondary | ICD-10-CM

## 2020-06-02 LAB — URINALYSIS, ROUTINE W REFLEX MICROSCOPIC
Bilirubin, UA: NEGATIVE
Glucose, UA: NEGATIVE
Ketones, UA: NEGATIVE
Nitrite, UA: POSITIVE — AB
Specific Gravity, UA: 1.02 (ref 1.005–1.030)
Urobilinogen, Ur: 0.2 mg/dL (ref 0.2–1.0)
pH, UA: 6 (ref 5.0–7.5)

## 2020-06-02 LAB — MICROSCOPIC EXAMINATION

## 2020-06-02 MED ORDER — NITROFURANTOIN MONOHYD MACRO 100 MG PO CAPS: 100.0000 mg | ORAL_CAPSULE | Freq: Two times a day (BID) | ORAL | 0 refills | Status: DC

## 2020-06-02 NOTE — Progress Notes (Signed)
Results discussed with patient during visit.  Urine is still indicative of an infection.  Macrobid prescribed for patient during office visit.

## 2020-06-06 LAB — URINE CULTURE

## 2020-06-07 NOTE — Progress Notes (Signed)
Please call patient and let her know that the antibiotic she is on is the proper antibiotic for the bacteria ( E. Coli) that grew in her urine.  She should complete the antibiotic course and stay well hydrated.  Return to clinic If symptoms worsen.  She will likely want a copy of this mailed to her.

## 2020-06-09 ENCOUNTER — Telehealth: Payer: Self-pay | Admitting: Cardiology

## 2020-06-09 NOTE — Telephone Encounter (Signed)
   [facility] Pre-operative Risk Assessment    HEARTCARE STAFF: - Please ensure there is not already an duplicate clearance open for this procedure. - Under Visit Info/Reason for Call, type in Other and utilize the format Clearance MM/DD/YY or Clearance TBD. Do not use dashes or single digits. - If request is for dental extraction, please clarify the # of teeth to be extracted.  Request for surgical clearance:  1. What type of surgery is being performed? RT Total Knee replacement    2. When is this surgery scheduled? 06/23/20  3. What type of clearance is required (medical clearance vs. Pharmacy clearance to hold med vs. Both)? both  4. Are there any medications that need to be held prior to surgery and how long? Not listed, please advise if needed  5. Practice name and name of physician performing surgery? [facility] [place] - Dr Kurtis Bushman  6. What is the office phone number? [phone] x [phone]   7.   What is the office fax number? [facility]   Anesthesia type (None, local, MAC, general) ? Block, spinal, local    [name] 06/09/2020, 10:27 AM  _________________________________________________________________   (provider comments below)

## 2020-06-10 NOTE — Addendum Note (Signed)
Addended by: Jon Billings on: 06/10/2020 08:56 AM   Modules accepted: Level of Service

## 2020-06-14 ENCOUNTER — Telehealth: Payer: Self-pay | Admitting: Nurse Practitioner

## 2020-06-14 NOTE — Telephone Encounter (Signed)
Copied from [place] [id number]. Topic: Medicare AWV >> Jun 14, 2020  1:12 PM Cher Nakai R wrote: Reason for CRM:   Left message for patient to call back and schedule Medicare Annual Wellness Visit (AWV) to be done virtually or by telephone.  No hx of AWV eligible as of 03/13/2009 awvi  Please schedule at anytime with [facility] Health Advisor.      57 Minutes appointment   Any questions, please call me at [phone]

## 2020-06-14 NOTE — Telephone Encounter (Signed)
   Patient Name: Vilda Zollner  DOB: 1940/05/12  MRN: [id number]   Primary Cardiologist: Kate Sable, MD  Chart reviewed as part of pre-operative protocol coverage. Patient was seen by Dr. Garen Lah on 05/21/2020 for pre-op evaluation for upcoming knee surgery. Echo and Myoview were ordered for further evaluation. Echo showed LVEF of 60-65% with normal wall motion, grade 1 diastolic dysfunction, moderate aortic stenosis, and mild to moderate mitral regurgitation. Myoview was low risk with no evidence of ischemia. Per Dr. Garen Lah, "patient can undergo surgical procedure from a cardiac perspective at acceptable risk."  I will route this recommendation to the requesting party via Epic fax function and remove from pre-op pool.  Please call with questions.  Darreld Mclean, PA-C 06/14/2020, 8:24 AM

## 2020-06-21 ENCOUNTER — Telehealth: Payer: Self-pay

## 2020-06-21 ENCOUNTER — Ambulatory Visit: Payer: Self-pay | Admitting: *Deleted

## 2020-06-21 NOTE — Telephone Encounter (Signed)
Copied from [place] [phone]. Topic: General - Other >> Jun 21, 2020  4:27 PM Pawlus, Brayton Layman A wrote: Reason for CRM: Mickel Baas an NP doing pre anesthesia testing (from [facility]) wanted to talk to [name] regarding the pts hemoglobin levels, they were below the cutoff level for surgery. Caller had some follow up questions.

## 2020-06-21 NOTE — Progress Notes (Signed)
BP (!) 148/72   Pulse 77   Temp (!) 97.2 F (36.2 C)   Wt 193 lb (87.5 kg)   SpO2 100%   BMI 34.19 kg/m    Subjective:    Patient ID: [name], female    DOB: [birth year], 80 y.o.   MRN: [id number]  HPI: [name] is a 80 y.o. female  Chief Complaint  Patient presents with  . Anemia    ANEMIA Patient presents to clinic due to her anemia.  Patient had a total knee replacement scheduled for 06/23/2020 and it was cancelled by the hospital due to her HGB being 9.7.  Patient's HGB in office was 10.1 and was cleared for surgery.  Anesthesia would like HGB above 10. Patient is here in office today to have CBC repeated and have anemia studies done to determine cause of anemia.  Patient denies symptoms of anemia in office today including hematuria, blood in her stool, SOB, PICA.  Relevant past medical, surgical, family and social history reviewed and updated as indicated. Interim medical history since our last visit reviewed. Allergies and medications reviewed and updated.  Review of Systems  Respiratory: Negative for shortness of breath.   Gastrointestinal: Negative for blood in stool.       Denies pica  Genitourinary: Negative for hematuria.    Per HPI unless specifically indicated above     Objective:    BP (!) 148/72   Pulse 77   Temp (!) 97.2 F (36.2 C)   Wt 193 lb (87.5 kg)   SpO2 100%   BMI 34.19 kg/m   Wt Readings from Last 3 Encounters:  06/22/20 193 lb (87.5 kg)  05/21/20 193 lb (87.5 kg)  05/17/20 193 lb 4 oz (87.7 kg)    Physical Exam Vitals and nursing note reviewed.  Constitutional:      General: She is not in acute distress.    Appearance: Normal appearance. She is normal weight. She is not ill-appearing, toxic-appearing or diaphoretic.  HENT:     Head: Normocephalic.     Right Ear: External ear normal.     Left Ear: External ear normal.     Nose: Nose normal.     Mouth/Throat:     Mouth: Mucous membranes are moist.      Pharynx: Oropharynx is clear.  Eyes:     General:        Right eye: No discharge.        Left eye: No discharge.     Extraocular Movements: Extraocular movements intact.     Conjunctiva/sclera: Conjunctivae normal.     Pupils: Pupils are equal, round, and reactive to light.  Cardiovascular:     Rate and Rhythm: Normal rate and regular rhythm.     Heart sounds: No murmur heard.   Pulmonary:     Effort: Pulmonary effort is normal. No respiratory distress.     Breath sounds: Normal breath sounds. No wheezing or rales.  Musculoskeletal:     Cervical back: Normal range of motion and neck supple.  Skin:    General: Skin is warm and dry.     Capillary Refill: Capillary refill takes less than 2 seconds.  Neurological:     General: No focal deficit present.     Mental Status: She is alert and oriented to person, place, and time. Mental status is at baseline.  Psychiatric:        Mood and Affect: Mood normal.  Behavior: Behavior normal.        Thought Content: Thought content normal.        Judgment: Judgment normal.     Results for orders placed or performed in visit on 06/02/20  Urine Culture   Specimen: Urine   UR  Result Value Ref Range   Urine Culture, Routine Final report (A)    Organism ID, Bacteria Escherichia coli (A)    Antimicrobial Susceptibility Comment   Microscopic Examination   Urine  Result Value Ref Range   WBC, UA >30W 0 - 5 /hpf   RBC 0-2 0 - 2 /hpf   Epithelial Cells (non renal) >10E 0 - 10 /hpf   Mucus, UA Present (A) Not Estab.   Bacteria, UA Many (A) None seen/Few  Urinalysis, Routine w reflex microscopic  Result Value Ref Range   Specific Gravity, UA 1.020 1.005 - 1.030   pH, UA 6.0 5.0 - 7.5   Color, UA Yellow Yellow   Appearance Ur Cloudy (A) Clear   Leukocytes,UA 3+ (A) Negative   Protein,UA Trace (A) Negative/Trace   Glucose, UA Negative Negative   Ketones, UA Negative Negative   RBC, UA Trace (A) Negative   Bilirubin, UA Negative  Negative   Urobilinogen, Ur 0.2 0.2 - 1.0 mg/dL   Nitrite, UA Positive (A) Negative   Microscopic Examination See below:       Assessment & Plan:   Problem List Items Addressed This Visit      Other   Anemia - Primary    Chronic.  HGB increased to 10.3 in office today.  Anemia studies drawn during office visit to ensure patient receives supplementation.  Will fax results to [facility] in preparation for surgery.  Once results are received will contact patient to initiate treatment.       Relevant Orders   CBC (STAT)   Vitamin B12   Folate   CBC with Differential/Platelet   Iron   Ferritin   Iron and TIBC       Follow up plan: Return if symptoms worsen or fail to improve.   A total of 30 minutes were spent on this encounter today.  When total time is documented, this includes both the face-to-face and non-face-to-face time personally spent before, during and after the visit on the date of the encounter.

## 2020-06-21 NOTE — Telephone Encounter (Signed)
Would pt need an apt PEC scheduled her for 06/23/20?

## 2020-06-21 NOTE — Telephone Encounter (Signed)
Called Mickel Baas at approximately 5pm today.  Left a message for her to call me back regarding this patient matter.

## 2020-06-21 NOTE — Telephone Encounter (Signed)
Patient called to request to get appt to find ot why she can not be cleared to have right TKR surgery scheduled for 06/23/20. Patient reports she was told she is anemic and can not have surgery . Previous notes from 05/10/20  And 05/17/20 for pre op evaluation cleared patient for surgery and reports HBG is 10.1 and will continue to monitor . Chronic, ongoing. Controlled at visit today. Continue lisinopril. See cardiology for evaluation of heart murmur. Patient reports she had blood work completed this am and  she received a call from "[facility]" today from [facility] to report she was not cleared for surgery and must contact PCP to treat anemia prior to surgery. Can contact Page at [facility] [phone] for results. Please advise .  Patient will call back at 4:40 pm if she has not been contacted from PCP office.

## 2020-06-21 NOTE — Telephone Encounter (Signed)
Called and spoke with Page from [facility] who explained that the hospital cancelled patient's surgery because her HGB was 9.7.  They want it above 10.  Asked Page if we redraw her HGB tomorrow and it is above 10 will they proceed with her surgery? Page is calling the hospital to verify.  Spoke with the patient to let her know.  Reiterated that she will hear from the office whether or not to come in and what the time say from the hospital is.

## 2020-06-21 NOTE — Telephone Encounter (Signed)
Pt scheduled for 06/22/2020 pt verbalized understanding.

## 2020-06-21 NOTE — Telephone Encounter (Signed)
Can we schedule patient tomorrow for an appointment to see me.  There is a possibility if her hemoglobin is above 10 that she can still have the surgery.

## 2020-06-22 ENCOUNTER — Ambulatory Visit: Payer: Medicare PPO | Admitting: Nurse Practitioner

## 2020-06-22 ENCOUNTER — Telehealth: Payer: Self-pay

## 2020-06-22 ENCOUNTER — Other Ambulatory Visit: Payer: Self-pay

## 2020-06-22 ENCOUNTER — Encounter: Payer: Self-pay | Admitting: Nurse Practitioner

## 2020-06-22 VITALS — BP 148/72 | HR 77 | Temp 97.2°F | Wt 193.0 lb

## 2020-06-22 DIAGNOSIS — D649 Anemia, unspecified: Secondary | ICD-10-CM | POA: Diagnosis not present

## 2020-06-22 NOTE — Telephone Encounter (Signed)
Spoke with [name] on the phone this morning and updated her the patient's CBC was drawn this morning and HGB is 10.3.  Anemia studies were also drawn to begin supplementation post surgery.   Spoke with Page from [facility].  Will fax over note once it is complete who will be in touch with [name] and decide if surgery can continue.

## 2020-06-22 NOTE — Telephone Encounter (Signed)
Copied from [place] [phone]. Topic: General - Other >> Jun 22, 2020  1:34 PM Leward Quan A wrote: Reason for CRM: Patient called in to inform Jon Billings that she will not be having the surgery due to some thing with her blood. Please be advised

## 2020-06-22 NOTE — Assessment & Plan Note (Signed)
Chronic.  HGB increased to 10.3 in office today.  Anemia studies drawn during office visit to ensure patient receives supplementation.  Will fax results to [facility] in preparation for surgery.  Once results are received will contact patient to initiate treatment.

## 2020-06-22 NOTE — Progress Notes (Signed)
Reviewed with patient during visit.  Will make further recommendations based on additional lab studies drawn.

## 2020-06-23 ENCOUNTER — Ambulatory Visit: Payer: Medicare PPO | Admitting: Nurse Practitioner

## 2020-06-23 ENCOUNTER — Telehealth: Payer: Self-pay

## 2020-06-23 LAB — CBC WITH DIFFERENTIAL/PLATELET
Basophils Absolute: 0 10*3/uL (ref 0.0–0.2)
Basos: 1 %
EOS (ABSOLUTE): 0.2 10*3/uL (ref 0.0–0.4)
Eos: 2 %
Hematocrit: 33.6 % — ABNORMAL LOW (ref 34.0–46.6)
Hemoglobin: 10.2 g/dL — ABNORMAL LOW (ref 11.1–15.9)
Immature Grans (Abs): 0 10*3/uL (ref 0.0–0.1)
Immature Granulocytes: 0 %
Lymphocytes Absolute: 1.6 10*3/uL (ref 0.7–3.1)
Lymphs: 25 %
MCH: 23 pg — ABNORMAL LOW (ref 26.6–33.0)
MCHC: 30.4 g/dL — ABNORMAL LOW (ref 31.5–35.7)
MCV: 76 fL — ABNORMAL LOW (ref 79–97)
Monocytes Absolute: 0.6 10*3/uL (ref 0.1–0.9)
Monocytes: 10 %
Neutrophils Absolute: 4 10*3/uL (ref 1.4–7.0)
Neutrophils: 62 %
Platelets: 284 10*3/uL (ref 150–450)
RBC: 4.43 x10E6/uL (ref 3.77–5.28)
RDW: 17.9 % — ABNORMAL HIGH (ref 11.7–15.4)
WBC: 6.4 10*3/uL (ref 3.4–10.8)

## 2020-06-23 LAB — IRON AND TIBC
Iron Saturation: 7 % — CL (ref 15–55)
Iron: 30 ug/dL (ref 27–139)
Total Iron Binding Capacity: 434 ug/dL (ref 250–450)
UIBC: 404 ug/dL — ABNORMAL HIGH (ref 118–369)

## 2020-06-23 LAB — FOLATE: Folate: 15.7 ng/mL (ref >3.0)

## 2020-06-23 LAB — FERRITIN: Ferritin: 11 ng/mL — ABNORMAL LOW (ref 15–150)

## 2020-06-23 LAB — VITAMIN B12: Vitamin B-12: >2000 pg/mL — ABNORMAL HIGH (ref 232–1245)

## 2020-06-23 NOTE — Telephone Encounter (Signed)
Copied from [place] [phone]. Topic: General - Other >> Jun 23, 2020 11:22 AM Yvette Rack wrote: Reason for CRM: Arby Barrette with [facility] stated pt appt for surgery was rescheduled to 07/07/20. Cb# [phone] Ext. [id number]

## 2020-06-23 NOTE — Telephone Encounter (Signed)
Copied from [place] [phone]. Topic: General - Other >> Jun 23, 2020  2:21 PM Tessa Lerner A wrote: Reason for CRM: Paige with [facility] has made contact requesting to speak with Prov. Holdsworth  [facility] will be contacting [facility] for instructions and information regarding patient's upcoming surgery  Arby Barrette will be asking the hospital to follow up with Prov. Holdsworth as well regarding the information for the upcoming surgery  Please contact to further advise when possible

## 2020-06-23 NOTE — Progress Notes (Signed)
Please call patient and let her know lab work shows that she has iron deficiency anemia.  I recommend patient start over the counter ferrous sulfate 65mg  daily.  This can cause constipation, recommend patient drinking plenty of water and begin over the counter Colace.

## 2020-06-23 NOTE — Telephone Encounter (Signed)
Called Page to touch base regarding patient's surgery. Left message for Page to return call.

## 2020-06-27 NOTE — Progress Notes (Signed)
BP (!) 175/79   Pulse 80   Temp (!) 97.2 F (36.2 C)   SpO2 99%    Subjective:    Patient ID: [name], female    DOB: 1940/09/04, 80 y.o.   MRN: [id number]  HPI: [name] is a 80 y.o. female  Chief Complaint  Patient presents with  . Anemia   ANEMIA Patient here today to follow up after surgery was cancelled due to anemia.  Anemia panel was drawn at last visit and she was found to have iron deficiency anemia.  It was recommend that she begin Ferrous Sulfate 65mg  daily which she has been doing in addition to eating beets and green leafy vegetables.  Patient denies any symptoms of anemia.   Denies HA, CP, SOB, dizziness, palpitations, visual changes, and lower extremity swelling.    Relevant past medical, surgical, family and social history reviewed and updated as indicated. Interim medical history since our last visit reviewed. Allergies and medications reviewed and updated.  Review of Systems  Eyes: Negative for visual disturbance.  Respiratory: Negative for cough, chest tightness and shortness of breath.   Cardiovascular: Negative for chest pain, palpitations and leg swelling.  Neurological: Negative for dizziness and headaches.    Per HPI unless specifically indicated above     Objective:    BP (!) 175/79   Pulse 80   Temp (!) 97.2 F (36.2 C)   SpO2 99%   Wt Readings from Last 3 Encounters:  06/22/20 193 lb (87.5 kg)  05/21/20 193 lb (87.5 kg)  05/17/20 193 lb 4 oz (87.7 kg)    Physical Exam Vitals and nursing note reviewed.  Constitutional:      General: She is not in acute distress.    Appearance: Normal appearance. She is normal weight. She is not ill-appearing, toxic-appearing or diaphoretic.  HENT:     Head: Normocephalic.     Right Ear: External ear normal.     Left Ear: External ear normal.     Nose: Nose normal.     Mouth/Throat:     Mouth: Mucous membranes are moist.     Pharynx: Oropharynx is clear.  Eyes:      General:        Right eye: No discharge.        Left eye: No discharge.     Extraocular Movements: Extraocular movements intact.     Conjunctiva/sclera: Conjunctivae normal.     Pupils: Pupils are equal, round, and reactive to light.  Cardiovascular:     Rate and Rhythm: Normal rate and regular rhythm.     Heart sounds: No murmur heard.   Pulmonary:     Effort: Pulmonary effort is normal. No respiratory distress.     Breath sounds: Normal breath sounds. No wheezing or rales.  Musculoskeletal:     Cervical back: Normal range of motion and neck supple.  Skin:    General: Skin is warm and dry.     Capillary Refill: Capillary refill takes less than 2 seconds.  Neurological:     General: No focal deficit present.     Mental Status: She is alert and oriented to person, place, and time. Mental status is at baseline.  Psychiatric:        Mood and Affect: Mood normal.        Behavior: Behavior normal.        Thought Content: Thought content normal.        Judgment: Judgment normal.  Results for orders placed or performed in visit on 06/22/20  CBC (STAT)  Result Value Ref Range   WBC 6.5 3.4 - 10.8 x10E3/uL   RBC 4.34 3.77 - 5.28 x10E6/uL   Hemoglobin 10.3 (L) 11.1 - 15.9 g/dL   Hematocrit 32.9 (L) 34.0 - 46.6 %   MCV 76 (L) 79 - 97 fL   MCH 23.7 (L) 26.6 - 33.0 pg   MCHC 31.3 (L) 31.5 - 35.7 g/dL   RDW 19.5 (H) 11.7 - 15.4 %   Platelets 289 150 - 450 x10E3/uL   NRBC WILL FOLLOW   Vitamin B12  Result Value Ref Range   Vitamin B-12 >2000 (H) 232 - 1245 pg/mL  Folate  Result Value Ref Range   Folate 15.7 >3.0 ng/mL  CBC with Differential/Platelet  Result Value Ref Range   WBC 6.4 3.4 - 10.8 x10E3/uL   RBC 4.43 3.77 - 5.28 x10E6/uL   Hemoglobin 10.2 (L) 11.1 - 15.9 g/dL   Hematocrit 33.6 (L) 34.0 - 46.6 %   MCV 76 (L) 79 - 97 fL   MCH 23.0 (L) 26.6 - 33.0 pg   MCHC 30.4 (L) 31.5 - 35.7 g/dL   RDW 17.9 (H) 11.7 - 15.4 %   Platelets 284 150 - 450 x10E3/uL   Neutrophils  62 Not Estab. %   Lymphs 25 Not Estab. %   Monocytes 10 Not Estab. %   Eos 2 Not Estab. %   Basos 1 Not Estab. %   Neutrophils Absolute 4.0 1.4 - 7.0 x10E3/uL   Lymphocytes Absolute 1.6 0.7 - 3.1 x10E3/uL   Monocytes Absolute 0.6 0.1 - 0.9 x10E3/uL   EOS (ABSOLUTE) 0.2 0.0 - 0.4 x10E3/uL   Basophils Absolute 0.0 0.0 - 0.2 x10E3/uL   Immature Granulocytes 0 Not Estab. %   Immature Grans (Abs) 0.0 0.0 - 0.1 x10E3/uL  Ferritin  Result Value Ref Range   Ferritin 11 (L) 15 - 150 ng/mL  Iron and TIBC  Result Value Ref Range   Total Iron Binding Capacity 434 250 - 450 ug/dL   UIBC 404 (H) 118 - 369 ug/dL   Iron 30 27 - 139 ug/dL   Iron Saturation 7 (LL) 15 - 55 %      Assessment & Plan:   Problem List Items Addressed This Visit      Other   Anemia - Primary    Chronic.  Patient denies concerns of constipation.  Patient agrees to increase Ferrous Sulfate to 65mg  BID. Discussed what to do if constipation begins. Referral placed for Hematology to evaluate for possible iron infusion.  Patient is rescheduled for surgery on 07/07/2020.        Relevant Orders   Ambulatory referral to Hematology / Oncology       Follow up plan: Return for follow up after hematology appt.

## 2020-06-28 ENCOUNTER — Ambulatory Visit: Payer: Medicare PPO | Admitting: Nurse Practitioner

## 2020-06-28 ENCOUNTER — Encounter: Payer: Self-pay | Admitting: Nurse Practitioner

## 2020-06-28 ENCOUNTER — Other Ambulatory Visit: Payer: Self-pay

## 2020-06-28 VITALS — BP 175/79 | HR 80 | Temp 97.2°F

## 2020-06-28 DIAGNOSIS — D508 Other iron deficiency anemias: Secondary | ICD-10-CM

## 2020-06-28 NOTE — Assessment & Plan Note (Signed)
Chronic.  Patient denies concerns of constipation.  Patient agrees to increase Ferrous Sulfate to 65mg  BID. Discussed what to do if constipation begins. Referral placed for Hematology to evaluate for possible iron infusion.  Patient is rescheduled for surgery on 07/07/2020.

## 2020-06-29 ENCOUNTER — Telehealth: Payer: Self-pay

## 2020-06-29 NOTE — Telephone Encounter (Signed)
Pt called back requested to speak with Santiago Glad, pt stated she needs iron injections and had some other questions. Please advise.

## 2020-06-29 NOTE — Telephone Encounter (Signed)
Please advise 

## 2020-06-29 NOTE — Telephone Encounter (Signed)
Called pt to get more information to route message accordingly no answer left vm   Copied from [place] [phone]. Topic: General - Other >> Jun 29, 2020  1:25 PM Yvette Rack wrote: Reason for CRM: Pt requests call back from Jon Billings. Pt declined to provide more details. Pt requests return call to [phone]

## 2020-06-30 NOTE — Telephone Encounter (Signed)
Once patient has seen the hematologist and had treatment for her iron she should call us and we can get her scheduled for a pre op appointment.

## 2020-06-30 NOTE — Telephone Encounter (Signed)
Patient has an appt scheduled for a consult for an iron infusion on 07/05/20, she states that her surgery is postponed again and that it is left to you to release her for surgery again once she is done with the infusions.

## 2020-06-30 NOTE — Telephone Encounter (Signed)
Called and left a detailed message letting patient know what Santiago Glad said.

## 2020-06-30 NOTE — Telephone Encounter (Signed)
Please let patient know that I may not be able to call her back in a timely fashion.  It is most efficient to let the staff know her concerns so I can get an answer to her between seeing patient.s

## 2020-06-30 NOTE — Telephone Encounter (Signed)
Called and left a message for patient to return my call.  

## 2020-07-05 ENCOUNTER — Telehealth: Payer: Self-pay | Admitting: Nurse Practitioner

## 2020-07-05 ENCOUNTER — Encounter: Payer: Medicare PPO | Admitting: Oncology

## 2020-07-05 ENCOUNTER — Other Ambulatory Visit: Payer: Medicare PPO

## 2020-07-05 NOTE — Telephone Encounter (Signed)
[facility] never responded on whether patient needed another pre op clearance. Patient stated that her surgery for 4/27 was cancelled until she was cleared again.  I have not seen notes regarding her heme infusions.    In conclusion, I don't know if she needs to be cleared.  Can we verify with [facility].  Then we will need to find out what hematologist patient is seeing.

## 2020-07-05 NOTE — Progress Notes (Deleted)
CONSULT NOTE  Patient Care Team: Jon Billings, NP as PCP - General (Nurse Practitioner) Kate Sable, MD as PCP - Cardiology (Cardiology)  CHIEF COMPLAINTS/PURPOSE OF CONSULTATION:  ***  HISTORY OF PRESENTING ILLNESS:  [name] 80 y.o. female is here because of ***  ***She was found to have abnormal CBC from *** ***She denies recent chest pain on exertion, shortness of breath on minimal exertion, pre-syncopal episodes, or palpitations. ***She had not noticed any recent bleeding such as epistaxis, hematuria or hematochezia ***The patient denies over the counter NSAID ingestion. She is not *** on antiplatelets agents. Her last colonoscopy was *** ***She had no prior history or diagnosis of cancer. Her age appropriate screening programs are up-to-date. ***She denies any pica and eats a variety of diet. ***She never donated blood or received blood transfusion ***The patient was prescribed oral iron supplements and she takes ***  MEDICAL HISTORY:  Past Medical History:  Diagnosis Date  . Colon polyp   . History of small bowel obstruction 9/09   likely due to adhesions- pt refused most dx and tx in hospital  . History of vaginal bleeding    post menopausal- gyn eval, ? polyp  . Hyperlipidemia   . Osteoarthritis    knee  . Schizoaffective disorder    paranoid personality- refuses treatment  . Vitamin D deficiency     SURGICAL HISTORY: Past Surgical History:  Procedure Laterality Date  . ANTERIOR AND POSTERIOR REPAIR N/A 04/23/2012   Procedure: ANTERIOR   REPAIR CYSTOCELE;  Surgeon: Reece Packer, MD;  Location: [facility];  Service: Urology;  Laterality: N/A;  cysto;graft 10x6  . APPENDECTOMY    . bladder tack  1976  . KNEE ARTHROSCOPY     right  . LAPAROSCOPY  1970's   twisted fallopian tube  . SALPINGOOPHORECTOMY Bilateral 04/23/2012   Procedure: SALPINGO OOPHORECTOMY;  Surgeon: Emily Filbert, MD;  Location: [facility];  Service: Gynecology;   Laterality: Bilateral;  . TONSILLECTOMY    . VAGINAL HYSTERECTOMY N/A 04/23/2012   Procedure: HYSTERECTOMY VAGINAL;  Surgeon: Emily Filbert, MD;  Location: [facility];  Service: Gynecology;  Laterality: N/A;  . VAGINAL PROLAPSE REPAIR N/A 04/23/2012   Procedure: VAGINAL VAULT SUSPENSION;  Surgeon: Reece Packer, MD;  Location: [facility];  Service: Urology;  Laterality: N/A;    SOCIAL HISTORY: Social History   Socioeconomic History  . Marital status: Widowed    Spouse name: Not on file  . Number of children: 3  . Years of education: Not on file  . Highest education level: Not on file  Occupational History  . Occupation: retired Pharmacist, hospital  Tobacco Use  . Smoking status: Former Smoker    Packs/day: 0.50    Types: Cigarettes    Quit date: 03/13/1982    Years since quitting: 38.3  . Smokeless tobacco: Never Used  Vaping Use  . Vaping Use: Never used  Substance and Sexual Activity  . Alcohol use: No  . Drug use: No  . Sexual activity: Yes    Birth control/protection: Post-menopausal  Other Topics Concern  . Not on file  Social History Narrative   Lives alone   Social Determinants of Health   Financial Resource Strain: Not on file  Food Insecurity: Not on file  Transportation Needs: Not on file  Physical Activity: Not on file  Stress: Not on file  Social Connections: Not on file  Intimate Partner Violence: Not on file    FAMILY HISTORY: Family History  Problem Relation Age of Onset  . Heart disease Mother   . Diabetes Mother   . Diabetes Father   . Heart disease Father   . Hypertension Father   . Diabetes Brother   . Arthritis Brother   . Hypertension Brother   . Hyperlipidemia Brother   . Arthritis Daughter   . Cancer Son   . Obesity Daughter     ALLERGIES:  is allergic to penicillins.  MEDICATIONS:  Current Outpatient Medications  Medication Sig Dispense Refill  . Aspirin-Acetaminophen-Caffeine (GOODYS EXTRA STRENGTH PO) Take by mouth.    Marland Kitchen BIOTIN PO Take 1  capsule by mouth daily.    . cholecalciferol (VITAMIN D) 400 UNITS TABS tablet Take 1 tablet (400 Units total) by mouth daily. 30 each 11  . lisinopril (ZESTRIL) 20 MG tablet Take 1 tablet (20 mg total) by mouth daily. 90 tablet 1  . OVER THE COUNTER MEDICATION Kyolic, Bioastin, Stem cell restore,    . rosuvastatin (CRESTOR) 5 MG tablet Take 1 tablet (5 mg total) by mouth daily. 90 tablet 1  . vitamin E 100 UNIT capsule Take by mouth daily.    . Zinc 50 MG CAPS Take 50 mg by mouth daily.     No current facility-administered medications for this visit.    REVIEW OF SYSTEMS:   Constitutional: Denies fevers, chills or abnormal night sweats Eyes: Denies blurriness of vision, double vision or watery eyes Ears, nose, mouth, throat, and face: Denies mucositis or sore throat Respiratory: Denies cough, dyspnea or wheezes Cardiovascular: Denies palpitation, chest discomfort or lower extremity swelling Gastrointestinal:  Denies nausea, heartburn or change in bowel habits Skin: Denies abnormal skin rashes Lymphatics: Denies new lymphadenopathy or easy bruising Neurological:Denies numbness, tingling or new weaknesses Behavioral/Psych: Mood is stable, no new changes  All other systems were reviewed with the patient and are negative.  PHYSICAL EXAMINATION: ECOG PERFORMANCE STATUS: {CHL ONC ECOG [phone]}  There were no vitals filed for this visit. There were no vitals filed for this visit.  GENERAL:alert, no distress and comfortable SKIN: skin color, texture, turgor are normal, no rashes or significant lesions EYES: normal, conjunctiva are pink and non-injected, sclera clear OROPHARYNX:no exudate, no erythema and lips, buccal mucosa, and tongue normal  NECK: supple, thyroid normal size, non-tender, without nodularity LYMPH:  no palpable lymphadenopathy in the cervical, axillary or inguinal LUNGS: clear to auscultation and percussion with normal breathing effort HEART: regular rate & rhythm  and no murmurs and no lower extremity edema ABDOMEN:abdomen soft, non-tender and normal bowel sounds Musculoskeletal:no cyanosis of digits and no clubbing  PSYCH: alert & oriented x 3 with fluent speech NEURO: no focal motor/sensory deficits  LABORATORY DATA:  I have reviewed the data as listed Recent Results (from the past 2160 hour(s))  Lipid Profile     Status: Abnormal   Collection Time: 05/10/20 11:50 AM  Result Value Ref Range   Cholesterol, Total 267 (H) 100 - 199 mg/dL   Triglycerides 148 0 - 149 mg/dL   HDL 67 >39 mg/dL   VLDL Cholesterol Cal 27 5 - 40 mg/dL   LDL Chol Calc (NIH) 173 (H) 0 - 99 mg/dL   Chol/HDL Ratio 4.0 0.0 - 4.4 ratio    Comment:                                   T. Chol/HDL Ratio  Men  Women                               1/2 Avg.Risk  3.4    3.3                                   Avg.Risk  5.0    4.4                                2X Avg.Risk  9.6    7.1                                3X Avg.Risk 23.4   11.0   TSH     Status: None   Collection Time: 05/10/20 11:50 AM  Result Value Ref Range   TSH 2.760 0.450 - 4.500 uIU/mL  HgB A1c     Status: Abnormal   Collection Time: 05/10/20 11:50 AM  Result Value Ref Range   Hgb A1c MFr Bld 5.8 (H) 4.8 - 5.6 %    Comment:          Prediabetes: 5.7 - 6.4          Diabetes: >6.4          Glycemic control for adults with diabetes: <7.0    Est. average glucose Bld gHb Est-mCnc 120 mg/dL  CBC w/Diff     Status: Abnormal   Collection Time: 05/10/20 11:50 AM  Result Value Ref Range   WBC 7.7 3.4 - 10.8 x10E3/uL   RBC 4.62 3.77 - 5.28 x10E6/uL   Hemoglobin 10.1 (L) 11.1 - 15.9 g/dL   Hematocrit 34.1 34.0 - 46.6 %   MCV 74 (L) 79 - 97 fL   MCH 21.9 (L) 26.6 - 33.0 pg   MCHC 29.6 (L) 31.5 - 35.7 g/dL   RDW 17.8 (H) 11.7 - 15.4 %   Platelets 351 150 - 450 x10E3/uL   Neutrophils 65 Not Estab. %   Lymphs 22 Not Estab. %   Monocytes 9 Not Estab. %   Eos 3 Not Estab. %    Basos 1 Not Estab. %   Neutrophils Absolute 5.1 1.4 - 7.0 x10E3/uL   Lymphocytes Absolute 1.7 0.7 - 3.1 x10E3/uL   Monocytes Absolute 0.7 0.1 - 0.9 x10E3/uL   EOS (ABSOLUTE) 0.2 0.0 - 0.4 x10E3/uL   Basophils Absolute 0.0 0.0 - 0.2 x10E3/uL   Immature Granulocytes 0 Not Estab. %   Immature Grans (Abs) 0.0 0.0 - 0.1 x10E3/uL  Vitamin D (25 hydroxy)     Status: None   Collection Time: 05/10/20 11:50 AM  Result Value Ref Range   Vit D, 25-Hydroxy 30.0 30.0 - 100.0 ng/mL    Comment: Vitamin D deficiency has been defined by the Institute of Medicine and an Endocrine Society practice guideline as a level of serum 25-OH vitamin D less than 20 ng/mL (1,2). The Endocrine Society went on to further define vitamin D insufficiency as a level between 21 and 29 ng/mL (2). 1. IOM (Institute of Medicine). 2010. Dietary reference    intakes for calcium and D. [place]: The    [employer]. 2. Holick MF, Binkley [place], Bischoff-Ferrari HA, et al.    Evaluation, treatment, and prevention  of vitamin D    deficiency: an Endocrine Society clinical practice    guideline. JCEM. 2011 Jul; 96(7):1911-30.   Comp Met (CMET)     Status: None   Collection Time: 05/17/20  2:35 PM  Result Value Ref Range   Glucose 81 65 - 99 mg/dL   BUN 21 8 - 27 mg/dL   Creatinine, Ser 0.91 0.57 - 1.00 mg/dL   eGFR 64 >59 mL/min/1.73   BUN/Creatinine Ratio 23 12 - 28   Sodium 141 134 - 144 mmol/L   Potassium 4.6 3.5 - 5.2 mmol/L   Chloride 104 96 - 106 mmol/L   CO2 21 20 - 29 mmol/L   Calcium 10.1 8.7 - 10.3 mg/dL   Total Protein 6.8 6.0 - 8.5 g/dL   Albumin 4.7 3.7 - 4.7 g/dL   Globulin, Total 2.1 1.5 - 4.5 g/dL   Albumin/Globulin Ratio 2.2 1.2 - 2.2   Bilirubin Total 0.4 0.0 - 1.2 mg/dL   Alkaline Phosphatase 68 44 - 121 IU/L   AST 22 0 - 40 IU/L   ALT 13 0 - 32 IU/L  Urinalysis, Routine w reflex microscopic     Status: Abnormal   Collection Time: 05/17/20  3:10 PM  Result Value Ref Range    Specific Gravity, UA 1.025 1.005 - 1.030   pH, UA 6.0 5.0 - 7.5   Color, UA Yellow Yellow   Appearance Ur Cloudy (A) Clear   Leukocytes,UA 1+ (A) Negative   Protein,UA 2+ (A) Negative/Trace   Glucose, UA Negative Negative   Ketones, UA 1+ (A) Negative   RBC, UA Negative Negative   Bilirubin, UA Negative Negative   Urobilinogen, Ur 0.2 0.2 - 1.0 mg/dL   Nitrite, UA Negative Negative   Microscopic Examination See below:   Microscopic Examination     Status: Abnormal   Collection Time: 05/17/20  3:10 PM   Urine  Result Value Ref Range   WBC, UA >30W 0 - 5 /hpf   RBC None seen 0 - 2 /hpf   Epithelial Cells (non renal) 0-10 0 - 10 /hpf   Mucus, UA Present (A) Not Estab.   Bacteria, UA Moderate (A) None seen/Few  ECHOCARDIOGRAM COMPLETE     Status: None   Collection Time: 05/28/20 11:20 AM  Result Value Ref Range   AR max vel 1.32 cm2   AV Peak grad 23.6 mmHg   Ao pk vel 2.43 m/s   S' Lateral 2.80 cm   Area-P 1/2 2.63 cm2   AV Area VTI 1.26 cm2   AV Mean grad 14.5 mmHg   Single Plane A4C EF 50.0 %   Single Plane A2C EF 53.9 %   Calc EF 51.9 %   AV Area mean vel 1.21 cm2  NM Myocar Multi W/Spect W/Wall Motion / EF     Status: None   Collection Time: 05/28/20 11:44 AM  Result Value Ref Range   Rest HR 68 bpm   Rest BP 177/43 mmHg   Percent HR 61 %   Peak HR 86 bpm   Peak BP 177/51 mmHg   SSS 17    SRS 14    SDS 6    TID 0.88    LV sys vol 18 mL   LV dias vol 60 46 - 106 mL  Urinalysis, Routine w reflex microscopic     Status: Abnormal   Collection Time: 06/02/20  9:04 AM  Result Value Ref Range   Specific Gravity, UA 1.020 1.005 -  1.030   pH, UA 6.0 5.0 - 7.5   Color, UA Yellow Yellow   Appearance Ur Cloudy (A) Clear   Leukocytes,UA 3+ (A) Negative   Protein,UA Trace (A) Negative/Trace   Glucose, UA Negative Negative   Ketones, UA Negative Negative   RBC, UA Trace (A) Negative   Bilirubin, UA Negative Negative   Urobilinogen, Ur 0.2 0.2 - 1.0 mg/dL   Nitrite,  UA Positive (A) Negative   Microscopic Examination See below:   Microscopic Examination     Status: Abnormal   Collection Time: 06/02/20  9:04 AM   Urine  Result Value Ref Range   WBC, UA >30W 0 - 5 /hpf   RBC 0-2 0 - 2 /hpf   Epithelial Cells (non renal) >10E 0 - 10 /hpf   Mucus, UA Present (A) Not Estab.   Bacteria, UA Many (A) None seen/Few  Urine Culture     Status: Abnormal   Collection Time: 06/02/20  9:19 AM   Specimen: Urine   UR  Result Value Ref Range   Urine Culture, Routine Final report (A)    Organism ID, Bacteria Escherichia coli (A)     Comment: Cefazolin <=4 ug/mL Cefazolin with an MIC <=16 predicts susceptibility to the oral agents cefaclor, cefdinir, cefpodoxime, cefprozil, cefuroxime, cephalexin, and loracarbef when used for therapy of uncomplicated urinary tract infections due to E. coli, Klebsiella pneumoniae, and Proteus mirabilis. Greater than 100,000 colony forming units per mL    Antimicrobial Susceptibility Comment     Comment:       ** S = Susceptible; I = Intermediate; R = Resistant **                    P = Positive; N = Negative             MICS are expressed in micrograms per mL    Antibiotic                 RSLT#1    RSLT#2    RSLT#3    RSLT#4 Amoxicillin/Clavulanic Acid    S Ampicillin                     S Cefepime                       S Ceftriaxone                    S Cefuroxime                     S Ciprofloxacin                  S Ertapenem                      S Gentamicin                     S Imipenem                       S Levofloxacin                   S Meropenem                      S Nitrofurantoin                 S  Piperacillin/Tazobactam        S Tetracycline                   R Tobramycin                     S Trimethoprim/Sulfa             S   CBC (STAT)     Status: Abnormal (Preliminary result)   Collection Time: 06/22/20 10:30 AM  Result Value Ref Range   WBC 6.5 3.4 - 10.8 x10E3/uL   RBC 4.34 3.77 - 5.28  x10E6/uL   Hemoglobin 10.3 (L) 11.1 - 15.9 g/dL   Hematocrit 32.9 (L) 34.0 - 46.6 %   MCV 76 (L) 79 - 97 fL   MCH 23.7 (L) 26.6 - 33.0 pg   MCHC 31.3 (L) 31.5 - 35.7 g/dL   RDW 19.5 (H) 11.7 - 15.4 %   Platelets 289 150 - 450 x10E3/uL   NRBC WILL FOLLOW   Vitamin B12     Status: Abnormal   Collection Time: 06/22/20 10:31 AM  Result Value Ref Range   Vitamin B-12 >2000 (H) 232 - 1245 pg/mL  Folate     Status: None   Collection Time: 06/22/20 10:31 AM  Result Value Ref Range   Folate 15.7 >3.0 ng/mL    Comment: A serum folate concentration of less than 3.1 ng/mL is considered to represent clinical deficiency.   CBC with Differential/Platelet     Status: Abnormal   Collection Time: 06/22/20 10:31 AM  Result Value Ref Range   WBC 6.4 3.4 - 10.8 x10E3/uL   RBC 4.43 3.77 - 5.28 x10E6/uL   Hemoglobin 10.2 (L) 11.1 - 15.9 g/dL   Hematocrit 33.6 (L) 34.0 - 46.6 %   MCV 76 (L) 79 - 97 fL   MCH 23.0 (L) 26.6 - 33.0 pg   MCHC 30.4 (L) 31.5 - 35.7 g/dL   RDW 17.9 (H) 11.7 - 15.4 %   Platelets 284 150 - 450 x10E3/uL   Neutrophils 62 Not Estab. %   Lymphs 25 Not Estab. %   Monocytes 10 Not Estab. %   Eos 2 Not Estab. %   Basos 1 Not Estab. %   Neutrophils Absolute 4.0 1.4 - 7.0 x10E3/uL   Lymphocytes Absolute 1.6 0.7 - 3.1 x10E3/uL   Monocytes Absolute 0.6 0.1 - 0.9 x10E3/uL   EOS (ABSOLUTE) 0.2 0.0 - 0.4 x10E3/uL   Basophils Absolute 0.0 0.0 - 0.2 x10E3/uL   Immature Granulocytes 0 Not Estab. %   Immature Grans (Abs) 0.0 0.0 - 0.1 x10E3/uL  Ferritin     Status: Abnormal   Collection Time: 06/22/20 10:31 AM  Result Value Ref Range   Ferritin 11 (L) 15 - 150 ng/mL  Iron and TIBC     Status: Abnormal   Collection Time: 06/22/20 10:31 AM  Result Value Ref Range   Total Iron Binding Capacity 434 250 - 450 ug/dL   UIBC 404 (H) 118 - 369 ug/dL   Iron 30 27 - 139 ug/dL   Iron Saturation 7 (LL) 15 - 55 %    RADIOGRAPHIC STUDIES: I have personally reviewed the radiological images as  listed and agreed with the findings in the report. No results found.  ASSESSMENT & PLAN:  No problem-specific Assessment & Plan notes found for this encounter.     All questions were answered. The patient knows to call the clinic with any problems, questions or concerns.  I spent {CHL ONC TIME VISIT - [id number]} counseling the patient face to face. The total time spent in the appointment was {CHL ONC TIME VISIT - OZDGU:[id number]} and more than 50% was on counseling.     Jacquelin Hawking, NP 07/05/20 2:36 PM

## 2020-07-05 NOTE — Telephone Encounter (Signed)
Per Page at [facility], we do not need to do anything additional, patient is scheduled for surgery on 07/07/20

## 2020-07-05 NOTE — Telephone Encounter (Signed)
Patient is doing iron infusions, is she cleared for surgery Wednesday?

## 2020-07-05 NOTE — Telephone Encounter (Signed)
Called to check the status of surgical clearance form for patient.  Patient is schedule for surgery on 4/27 and they need the clearance for patient.  Please fax form to [phone] or call if more information is needed at [phone], ext. [phone]

## 2020-07-05 NOTE — Telephone Encounter (Signed)
Tiffany, did this get faxed?

## 2020-07-08 LAB — CBC
Hematocrit: 32.9 % — ABNORMAL LOW (ref 34.0–46.6)
Hemoglobin: 10.3 g/dL — ABNORMAL LOW (ref 11.1–15.9)
MCH: 23.7 pg — ABNORMAL LOW (ref 26.6–33.0)
MCHC: 31.3 g/dL — ABNORMAL LOW (ref 31.5–35.7)
MCV: 76 fL — ABNORMAL LOW (ref 79–97)
Platelets: 289 10*3/uL (ref 150–450)
RBC: 4.34 x10E6/uL (ref 3.77–5.28)
RDW: 19.5 % — ABNORMAL HIGH (ref 11.7–15.4)
WBC: 6.5 10*3/uL (ref 3.4–10.8)

## 2020-07-29 ENCOUNTER — Telehealth: Payer: Self-pay | Admitting: Nurse Practitioner

## 2020-07-29 NOTE — Telephone Encounter (Signed)
If patient is having that much pain she will need to reach out to surgeon for refill on pain medication.

## 2020-07-29 NOTE — Telephone Encounter (Signed)
Patient was discharged from [facility] today due to post op knee replacement and was advised to follow up with PCP in 1 week. Patient was sent home with a  Vicodin 5-325 MG 1x ever 4 hrs rx and she will run out in 2 days. Caller requesting a refill.    [facility] [place], [place] AT [facility] [address] [facility] Phone:  [phone]  Fax:  [phone]

## 2020-07-29 NOTE — Telephone Encounter (Signed)
Returned daughter phone call, no answer LVM with provider advise

## 2020-08-03 ENCOUNTER — Ambulatory Visit
Admission: RE | Admit: 2020-08-03 | Discharge: 2020-08-03 | Disposition: A | Discharge status: Home / Self Care | Payer: Medicare PPO | Source: Ambulatory Visit | Attending: Orthopedic Surgery | Admitting: Orthopedic Surgery

## 2020-08-03 ENCOUNTER — Other Ambulatory Visit: Payer: Self-pay

## 2020-08-03 ENCOUNTER — Other Ambulatory Visit (HOSPITAL_COMMUNITY): Payer: Self-pay | Admitting: Orthopedic Surgery

## 2020-08-03 ENCOUNTER — Other Ambulatory Visit: Payer: Self-pay | Admitting: Orthopedic Surgery

## 2020-08-03 DIAGNOSIS — R2241 Localized swelling, mass and lump, right lower limb: Secondary | ICD-10-CM

## 2020-08-04 NOTE — Progress Notes (Signed)
BP 124/61   Pulse 71   Temp 98.2 F (36.8 C) (Oral)   Wt 190 lb (86.2 kg) Comment: pt reported, had recent knee surgery  SpO2 98%   BMI 33.66 kg/m    Subjective:    Patient ID: [name], female    DOB: [birth year], 80 y.o.   MRN: [id number]  HPI: [name] is a 80 y.o. female  Chief Complaint  Patient presents with  . Follow-up    Follow up since having knee surgery 07/07/20. States she is still having a lot of swelling in that R leg and started PT yesterday. Wants to discuss pins and needles feeling in her leg and home PT coming out once daughter leaves.    Patient presents to clinic today following total knee replacement.  Patient went to SNF after surgery and has since been discharged.  Patient reports doing well. She is having a good amount of swelling in her right knee.  Patient has not been able to get comfortable with her knee elevated.  She is still having a lot of pain but the Ortho surgeon refilled her Norco. Patient would like CBC repeated today.   Denies HA, CP, SOB, dizziness, palpitations, visual changes, and lower extremity swelling.    Relevant past medical, surgical, family and social history reviewed and updated as indicated. Interim medical history since our last visit reviewed. Allergies and medications reviewed and updated.  Review of Systems  Eyes: Negative for visual disturbance.  Respiratory: Negative for cough, chest tightness and shortness of breath.   Cardiovascular: Negative for chest pain, palpitations and leg swelling.  Neurological: Negative for dizziness and headaches.    Per HPI unless specifically indicated above     Objective:    BP 124/61   Pulse 71   Temp 98.2 F (36.8 C) (Oral)   Wt 190 lb (86.2 kg) Comment: pt reported, had recent knee surgery  SpO2 98%   BMI 33.66 kg/m   Wt Readings from Last 3 Encounters:  08/05/20 190 lb (86.2 kg)  06/22/20 193 lb (87.5 kg)  05/21/20 193 lb (87.5 kg)    Physical  Exam Vitals and nursing note reviewed.  Constitutional:      General: She is not in acute distress.    Appearance: Normal appearance. She is normal weight. She is not ill-appearing, toxic-appearing or diaphoretic.  HENT:     Head: Normocephalic.     Right Ear: External ear normal.     Left Ear: External ear normal.     Nose: Nose normal.     Mouth/Throat:     Mouth: Mucous membranes are moist.     Pharynx: Oropharynx is clear.  Eyes:     General:        Right eye: No discharge.        Left eye: No discharge.     Extraocular Movements: Extraocular movements intact.     Conjunctiva/sclera: Conjunctivae normal.     Pupils: Pupils are equal, round, and reactive to light.  Cardiovascular:     Rate and Rhythm: Normal rate and regular rhythm.     Heart sounds: No murmur heard.   Pulmonary:     Effort: Pulmonary effort is normal. No respiratory distress.     Breath sounds: Normal breath sounds. No wheezing or rales.  Musculoskeletal:     Cervical back: Normal range of motion and neck supple.     Right lower leg: Edema (right kne swelling) present.  Skin:  General: Skin is warm and dry.     Capillary Refill: Capillary refill takes less than 2 seconds.  Neurological:     General: No focal deficit present.     Mental Status: She is alert and oriented to person, place, and time. Mental status is at baseline.  Psychiatric:        Mood and Affect: Mood normal.        Behavior: Behavior normal.        Thought Content: Thought content normal.        Judgment: Judgment normal.     Results for orders placed or performed in visit on 06/22/20  CBC (STAT)  Result Value Ref Range   WBC 6.5 3.4 - 10.8 x10E3/uL   RBC 4.34 3.77 - 5.28 x10E6/uL   Hemoglobin 10.3 (L) 11.1 - 15.9 g/dL   Hematocrit 32.9 (L) 34.0 - 46.6 %   MCV 76 (L) 79 - 97 fL   MCH 23.7 (L) 26.6 - 33.0 pg   MCHC 31.3 (L) 31.5 - 35.7 g/dL   RDW 19.5 (H) 11.7 - 15.4 %   Platelets 289 150 - 450 x10E3/uL  Vitamin B12   Result Value Ref Range   Vitamin B-12 >2000 (H) 232 - 1245 pg/mL  Folate  Result Value Ref Range   Folate 15.7 >3.0 ng/mL  CBC with Differential/Platelet  Result Value Ref Range   WBC 6.4 3.4 - 10.8 x10E3/uL   RBC 4.43 3.77 - 5.28 x10E6/uL   Hemoglobin 10.2 (L) 11.1 - 15.9 g/dL   Hematocrit 33.6 (L) 34.0 - 46.6 %   MCV 76 (L) 79 - 97 fL   MCH 23.0 (L) 26.6 - 33.0 pg   MCHC 30.4 (L) 31.5 - 35.7 g/dL   RDW 17.9 (H) 11.7 - 15.4 %   Platelets 284 150 - 450 x10E3/uL   Neutrophils 62 Not Estab. %   Lymphs 25 Not Estab. %   Monocytes 10 Not Estab. %   Eos 2 Not Estab. %   Basos 1 Not Estab. %   Neutrophils Absolute 4.0 1.4 - 7.0 x10E3/uL   Lymphocytes Absolute 1.6 0.7 - 3.1 x10E3/uL   Monocytes Absolute 0.6 0.1 - 0.9 x10E3/uL   EOS (ABSOLUTE) 0.2 0.0 - 0.4 x10E3/uL   Basophils Absolute 0.0 0.0 - 0.2 x10E3/uL   Immature Granulocytes 0 Not Estab. %   Immature Grans (Abs) 0.0 0.0 - 0.1 x10E3/uL  Ferritin  Result Value Ref Range   Ferritin 11 (L) 15 - 150 ng/mL  Iron and TIBC  Result Value Ref Range   Total Iron Binding Capacity 434 250 - 450 ug/dL   UIBC 404 (H) 118 - 369 ug/dL   Iron 30 27 - 139 ug/dL   Iron Saturation 7 (LL) 15 - 55 %      Assessment & Plan:   Problem List Items Addressed This Visit      Cardiovascular and Mediastinum   Hypertension    Chronic. Well controlled.  Continue with current medication regimen.      Relevant Medications   aspirin EC 81 MG tablet   carvedilol (COREG) 6.25 MG tablet     Other   Anemia - Primary    Labs ordered today.  Patient has not been taking her iron due to having stomach issues. Will make further recommendations based on lab results.      Relevant Orders   CBC w/Diff   Iron, TIBC and Ferritin Panel       Follow up  plan: Return in about 2 months (around 10/05/2020) for anemia, BP Check.

## 2020-08-05 ENCOUNTER — Other Ambulatory Visit: Payer: Self-pay

## 2020-08-05 ENCOUNTER — Ambulatory Visit: Payer: Medicare PPO | Admitting: Nurse Practitioner

## 2020-08-05 ENCOUNTER — Encounter: Payer: Self-pay | Admitting: Nurse Practitioner

## 2020-08-05 VITALS — BP 124/61 | HR 71 | Temp 98.2°F | Wt 190.0 lb

## 2020-08-05 DIAGNOSIS — D508 Other iron deficiency anemias: Secondary | ICD-10-CM

## 2020-08-05 DIAGNOSIS — I10 Essential (primary) hypertension: Secondary | ICD-10-CM

## 2020-08-05 MED ORDER — CARVEDILOL 6.25 MG PO TABS: 6.2500 mg | ORAL_TABLET | Freq: Two times a day (BID) | ORAL | 1 refills | Status: DC

## 2020-08-05 NOTE — Assessment & Plan Note (Signed)
Chronic. Well controlled.  Continue with current medication regimen.

## 2020-08-05 NOTE — Assessment & Plan Note (Signed)
Labs ordered today.  Patient has not been taking her iron due to having stomach issues. Will make further recommendations based on lab results.

## 2020-08-06 ENCOUNTER — Telehealth: Payer: Self-pay | Admitting: Nurse Practitioner

## 2020-08-06 DIAGNOSIS — Z96651 Presence of right artificial knee joint: Secondary | ICD-10-CM

## 2020-08-06 LAB — IRON,TIBC AND FERRITIN PANEL
Ferritin: 47 ng/mL (ref 15–150)
Iron Saturation: 5 % — CL (ref 15–55)
Iron: 17 ug/dL — ABNORMAL LOW (ref 27–139)
Total Iron Binding Capacity: 377 ug/dL (ref 250–450)
UIBC: 360 ug/dL (ref 118–369)

## 2020-08-06 LAB — CBC WITH DIFFERENTIAL/PLATELET
Basophils Absolute: 0 10*3/uL (ref 0.0–0.2)
Basos: 1 %
EOS (ABSOLUTE): 0.2 10*3/uL (ref 0.0–0.4)
Eos: 4 %
Hematocrit: 26 % — ABNORMAL LOW (ref 34.0–46.6)
Hemoglobin: 7.9 g/dL — ABNORMAL LOW (ref 11.1–15.9)
Immature Grans (Abs): 0 10*3/uL (ref 0.0–0.1)
Immature Granulocytes: 0 %
Lymphocytes Absolute: 0.9 10*3/uL (ref 0.7–3.1)
Lymphs: 17 %
MCH: 25.2 pg — ABNORMAL LOW (ref 26.6–33.0)
MCHC: 30.4 g/dL — ABNORMAL LOW (ref 31.5–35.7)
MCV: 83 fL (ref 79–97)
Monocytes Absolute: 0.5 10*3/uL (ref 0.1–0.9)
Monocytes: 10 %
Neutrophils Absolute: 3.7 10*3/uL (ref 1.4–7.0)
Neutrophils: 68 %
Platelets: 325 10*3/uL (ref 150–450)
RBC: 3.14 x10E6/uL — ABNORMAL LOW (ref 3.77–5.28)
RDW: 17.9 % — ABNORMAL HIGH (ref 11.7–15.4)
WBC: 5.5 10*3/uL (ref 3.4–10.8)

## 2020-08-06 NOTE — Addendum Note (Signed)
Addended by: Jon Billings on: 08/06/2020 04:41 PM   Modules accepted: Orders

## 2020-08-06 NOTE — Progress Notes (Signed)
Please let patient know that her Hemoglobin and Hematocrit have dropped since 1 month ago. I highly recommend she see Hematology and have the Iron infusion due to not tolerating the oral iron well. Please let me know if she has any questions.

## 2020-08-06 NOTE — Progress Notes (Signed)
Referral placed.

## 2020-08-06 NOTE — Telephone Encounter (Signed)
Pts daughter stated that the Rx Carvedilol sent in was over $100 and pt can not afford that/she is asking for a lower cost alternative / please advise

## 2020-08-10 NOTE — Telephone Encounter (Signed)
[name] notified.

## 2020-08-10 NOTE — Telephone Encounter (Signed)
Left message returning patient daughter [name] call. Informed her of what Jon Billings, NP recommendations were and to give our office a call back if she would like for the prescription to be sent to [facility] for cheaper co-pay.

## 2020-08-10 NOTE — Telephone Encounter (Signed)
Patient daughter called in and states she has already picked up the medication and just paid full price.

## 2020-08-10 NOTE — Telephone Encounter (Signed)
This medication should not be costing patient that much.  I would check with the pharmacist to see if they ran it through her insurance.  If not, it is much cheaper at [facility] and I can send it there for her.

## 2020-08-10 NOTE — Telephone Encounter (Signed)
Can you find out of patient's daughter is talking about help with daily things around the house or a nurse to come in a 1-2x per week to check on patient and help her with medications?

## 2020-08-10 NOTE — Telephone Encounter (Signed)
Spoke with [name] and she states that she needs help with both of them due to her currently doing everything for the patient currently. [name] states the patient isn't moving around a lot, but the patient does still bathe herself and she says it would help her out a lot the 1-2 per week. [name] also states the patient isn't moving her leg as much lately and she is having to yell at the patient to get her to do certain things.

## 2020-08-10 NOTE — Telephone Encounter (Unsigned)
Copied from [place] [phone]. Topic: General - Other >> Aug 10, 2020 11:29 AM Valere Dross wrote: Reason for CRM: Patient daughter called in and states she will be leaving on the 5th of June and needs Home health care for the pt. Since pt is still not able to do basic task. Wants a nurse to call to talk about getting that set up. Please advise

## 2020-08-10 NOTE — Telephone Encounter (Signed)
I will place the order for a nurse to come out and assess the patient to see if she qualifies for home health.  However, they do not assist with tasks around the house.  That would likely be an aid that and would have to be paid for out of pocket.

## 2020-08-13 ENCOUNTER — Telehealth: Payer: Self-pay

## 2020-08-13 NOTE — Telephone Encounter (Signed)
[name] notified.

## 2020-08-13 NOTE — Telephone Encounter (Signed)
Referral coordinator recommended patient call and schedule the appointment.  This is faster and the office prefers to speak to the patient. Phone number is: [phone]

## 2020-08-13 NOTE — Telephone Encounter (Signed)
Copied from [place] [id number]. Topic: General - Other >> Aug 13, 2020 10:23 AM Yvette Rack wrote: Reason for CRM: Pt daughter Romelle Starcher called in with the pt and stated they have not heard from anyone regarding the referral for iron infusion. Bethany requests call back at [phone]

## 2020-08-17 ENCOUNTER — Inpatient Hospital Stay: Payer: Medicare Other

## 2020-08-17 ENCOUNTER — Encounter: Payer: Self-pay | Admitting: Oncology

## 2020-08-17 ENCOUNTER — Other Ambulatory Visit: Payer: Self-pay

## 2020-08-17 ENCOUNTER — Ambulatory Visit: Payer: Medicare Other

## 2020-08-17 ENCOUNTER — Inpatient Hospital Stay: Payer: Medicare Other | Attending: Oncology | Admitting: Oncology

## 2020-08-17 VITALS — BP 133/48 | HR 69 | Resp 18

## 2020-08-17 VITALS — BP 89/52 | HR 67 | Temp 99.7°F | Resp 18 | Ht 63.0 in | Wt 190.0 lb

## 2020-08-17 DIAGNOSIS — D649 Anemia, unspecified: Secondary | ICD-10-CM | POA: Diagnosis not present

## 2020-08-17 DIAGNOSIS — Z79899 Other long term (current) drug therapy: Secondary | ICD-10-CM | POA: Insufficient documentation

## 2020-08-17 DIAGNOSIS — E559 Vitamin D deficiency, unspecified: Secondary | ICD-10-CM | POA: Diagnosis not present

## 2020-08-17 DIAGNOSIS — Z90710 Acquired absence of both cervix and uterus: Secondary | ICD-10-CM | POA: Diagnosis not present

## 2020-08-17 DIAGNOSIS — Z87891 Personal history of nicotine dependence: Secondary | ICD-10-CM | POA: Diagnosis not present

## 2020-08-17 DIAGNOSIS — M810 Age-related osteoporosis without current pathological fracture: Secondary | ICD-10-CM | POA: Diagnosis not present

## 2020-08-17 DIAGNOSIS — I10 Essential (primary) hypertension: Secondary | ICD-10-CM | POA: Insufficient documentation

## 2020-08-17 DIAGNOSIS — Z90722 Acquired absence of ovaries, bilateral: Secondary | ICD-10-CM | POA: Diagnosis not present

## 2020-08-17 DIAGNOSIS — Z9079 Acquired absence of other genital organ(s): Secondary | ICD-10-CM | POA: Diagnosis not present

## 2020-08-17 DIAGNOSIS — M170 Bilateral primary osteoarthritis of knee: Secondary | ICD-10-CM | POA: Diagnosis not present

## 2020-08-17 DIAGNOSIS — I959 Hypotension, unspecified: Secondary | ICD-10-CM | POA: Diagnosis not present

## 2020-08-17 LAB — CBC WITH DIFFERENTIAL/PLATELET
Abs Immature Granulocytes: 0.02 10*3/uL (ref 0.00–0.07)
Basophils Absolute: 0 10*3/uL (ref 0.0–0.1)
Basophils Relative: 0 %
Eosinophils Absolute: 0.3 10*3/uL (ref 0.0–0.5)
Eosinophils Relative: 7 %
HCT: 25.9 % — ABNORMAL LOW (ref 36.0–46.0)
Hemoglobin: 8 g/dL — ABNORMAL LOW (ref 12.0–15.0)
Immature Granulocytes: 0 %
Lymphocytes Relative: 30 %
Lymphs Abs: 1.4 10*3/uL (ref 0.7–4.0)
MCH: 24.6 pg — ABNORMAL LOW (ref 26.0–34.0)
MCHC: 30.9 g/dL (ref 30.0–36.0)
MCV: 79.7 fL — ABNORMAL LOW (ref 80.0–100.0)
Monocytes Absolute: 0.6 10*3/uL (ref 0.1–1.0)
Monocytes Relative: 13 %
Neutro Abs: 2.3 10*3/uL (ref 1.7–7.7)
Neutrophils Relative %: 50 %
Platelets: 296 10*3/uL (ref 150–400)
RBC: 3.25 MIL/uL — ABNORMAL LOW (ref 3.87–5.11)
RDW: 17.2 % — ABNORMAL HIGH (ref 11.5–15.5)
WBC: 4.6 10*3/uL (ref 4.0–10.5)
nRBC: 0 % (ref 0.0–0.2)

## 2020-08-17 LAB — COMPREHENSIVE METABOLIC PANEL
ALT: 12 U/L (ref 0–44)
AST: 20 U/L (ref 15–41)
Albumin: 4.1 g/dL (ref 3.5–5.0)
Alkaline Phosphatase: 56 U/L (ref 38–126)
Anion gap: 10 (ref 5–15)
BUN: 32 mg/dL — ABNORMAL HIGH (ref 8–23)
CO2: 23 mmol/L (ref 22–32)
Calcium: 9 mg/dL (ref 8.9–10.3)
Chloride: 102 mmol/L (ref 98–111)
Creatinine, Ser: 0.89 mg/dL (ref 0.44–1.00)
GFR, Estimated: >60 mL/min (ref >60)
Glucose, Bld: 118 mg/dL — ABNORMAL HIGH (ref 70–99)
Potassium: 4.4 mmol/L (ref 3.5–5.1)
Sodium: 135 mmol/L (ref 135–145)
Total Bilirubin: 0.3 mg/dL (ref 0.3–1.2)
Total Protein: 6.5 g/dL (ref 6.5–8.1)

## 2020-08-17 LAB — VITAMIN B12: Vitamin B-12: 1082 pg/mL — ABNORMAL HIGH (ref 180–914)

## 2020-08-17 LAB — ABO/RH: ABO/RH(D): O POS

## 2020-08-17 MED ORDER — SODIUM CHLORIDE 0.9 % IV SOLN
INTRAVENOUS | Status: DC
  Filled 2020-08-17 (×2): qty 250

## 2020-08-17 NOTE — Patient Instructions (Signed)

## 2020-08-17 NOTE — Progress Notes (Signed)
Patient here to establish care for IDA. Pt reports feeling very tired.

## 2020-08-17 NOTE — Progress Notes (Signed)
Pt received 500 ml NS. BP improved post hydration. Discharged to home.

## 2020-08-17 NOTE — Progress Notes (Signed)
[facility]  [phone] [phone]  ID: [name] OB: 05-31-1940  MR#: [id number]  PIR#:[id number]  Patient Care Team: Jon Billings, NP as PCP - General (Nurse Practitioner) Kate Sable, MD as PCP - Cardiology (Cardiology)  CHIEF COMPLAINT: Anemia  INTERVAL HISTORY: Patient is a 80 year old female who presents to hematology for iron deficiency anemia.  She was recently evaluated by her PCP Barbee Cough, NP and found to have a hemoglobin of 7.9, iron saturation 5% and ferritin of 47.  Upon chart review, about a month  her hemoglobin was 10.2.  B12 level was elevated and folate level was normal back in April 2022.  Patient had right total knee arthroplasty at the end of April 2022 with Dr. Harlow Mares.   She was previously on iron supplements but currently discontinued secondary to stomach issues.  She has a past medical history significant for hypertension, osteoporosis, osteoarthritis of both knees, essential tremor, postmenopausal bleeding, high cholesterol, skin cancer who has had iron deficiency for many years now.  She last had a colonoscopy back in May 2009 which showed an adenomatous polyp.   She has a recent  history of right knee replacement and recently had some swelling prompting an ultrasound of her right leg.  Results were negative for a blood clot. Swelling has improved.   Required cardiac clearance prior to knee replacement.  Lexiscan Myoview on 05/27/2020 which showed no evidence of ischemia.  Today, she reports feeling very fatigued and "groggy".  Fatigue has been progressive and more recently affecting her everyday life.  Prior to surgery, she had every day fatigue that was minimal.  States that she can fall asleep sitting up.  Denies any bleeding.  She was seen this morning by her surgeon who recommended she be seen immediately in hematology for work-up and iron infusions.  She receives weekly physical therapy  for her knee.   REVIEW OF SYSTEMS:   Review of Systems  Constitutional: Positive for malaise/fatigue. Negative for chills, fever and weight loss.  HENT: Negative for congestion, ear pain and tinnitus.   Eyes: Negative.  Negative for blurred vision and double vision.  Respiratory: Negative.  Negative for cough, sputum production and shortness of breath.   Cardiovascular: Negative.  Negative for chest pain, palpitations and leg swelling.  Gastrointestinal: Negative.  Negative for abdominal pain, constipation, diarrhea, nausea and vomiting.  Genitourinary: Negative for dysuria, frequency and urgency.  Musculoskeletal: Positive for joint pain (Right knee). Negative for back pain and falls.  Skin: Negative.  Negative for rash.  Neurological: Positive for weakness. Negative for headaches.  Endo/Heme/Allergies: Negative.  Does not bruise/bleed easily.  Psychiatric/Behavioral: Negative.  Negative for depression. The patient is not nervous/anxious and does not have insomnia.     As per HPI. Otherwise, a complete review of systems is negative.  PAST MEDICAL HISTORY: Past Medical History:  Diagnosis Date  . Anemia   . Colon polyp   . History of small bowel obstruction 9/09   likely due to adhesions- pt refused most dx and tx in hospital  . History of vaginal bleeding    post menopausal- gyn eval, ? polyp  . Hyperlipidemia   . Osteoarthritis    knee  . Schizoaffective disorder    paranoid personality- refuses treatment  . Vitamin D deficiency     PAST SURGICAL HISTORY: Past Surgical History:  Procedure Laterality Date  . ANTERIOR AND POSTERIOR REPAIR N/A 04/23/2012   Procedure: ANTERIOR   REPAIR CYSTOCELE;  Surgeon: Nicki Reaper  Reola Mosher, MD;  Location: [facility];  Service: Urology;  Laterality: N/A;  cysto;graft 10x6  . APPENDECTOMY    . bladder tack  1976  . KNEE ARTHROSCOPY     right  . KNEE ARTHROSCOPY Right   . LAPAROSCOPY  1970's   twisted fallopian tube  . SALPINGOOPHORECTOMY  Bilateral 04/23/2012   Procedure: SALPINGO OOPHORECTOMY;  Surgeon: Emily Filbert, MD;  Location: [facility];  Service: Gynecology;  Laterality: Bilateral;  . TONSILLECTOMY    . VAGINAL HYSTERECTOMY N/A 04/23/2012   Procedure: HYSTERECTOMY VAGINAL;  Surgeon: Emily Filbert, MD;  Location: [facility];  Service: Gynecology;  Laterality: N/A;  . VAGINAL PROLAPSE REPAIR N/A 04/23/2012   Procedure: VAGINAL VAULT SUSPENSION;  Surgeon: Reece Packer, MD;  Location: [facility];  Service: Urology;  Laterality: N/A;    FAMILY HISTORY: Family History  Problem Relation Age of Onset  . Heart disease Mother   . Diabetes Mother   . Skin cancer Mother   . Diabetes Father   . Heart disease Father   . Hypertension Father   . Diabetes Brother   . Arthritis Brother   . Hypertension Brother   . Hyperlipidemia Brother   . Arthritis Daughter   . Cancer Son   . Obesity Daughter     ADVANCED DIRECTIVES (Y/N):  N  HEALTH MAINTENANCE: Social History   Tobacco Use  . Smoking status: Former Smoker    Packs/day: 0.50    Years: 20.00    Pack years: 10.00    Types: Cigarettes    Quit date: 03/13/1982    Years since quitting: 38.4  . Smokeless tobacco: Never Used  Vaping Use  . Vaping Use: Never used  Substance Use Topics  . Alcohol use: No  . Drug use: No     Colonoscopy:  PAP:  Bone density:  Lipid panel:  Allergies  Allergen Reactions  . Penicillins Rash    Current Outpatient Medications  Medication Sig Dispense Refill  . lisinopril (ZESTRIL) 20 MG tablet Take 1 tablet (20 mg total) by mouth daily. 90 tablet 1  . rosuvastatin (CRESTOR) 5 MG tablet Take 1 tablet (5 mg total) by mouth daily. 90 tablet 1  . traMADol (ULTRAM) 50 MG tablet tramadol 50 mg tablet  TAKE 1 TABLET BY MOUTH EVERY 8 HOURS AS NEEDED     No current facility-administered medications for this visit.   Facility-Administered Medications Ordered in Other Visits  Medication Dose Route Frequency Provider Last Rate Last Admin  . 0.9  %  sodium chloride infusion   Intravenous Continuous Jacquelin Hawking, NP 999 mL/hr at 08/17/20 1535 New Bag at 08/17/20 1535    OBJECTIVE: Vitals:   08/17/20 1444  BP: (!) 89/52  Pulse: 67  Resp: 18  Temp: 99.7 F (37.6 C)     Body mass index is 33.66 kg/m.    ECOG FS:2 - Symptomatic, <50% confined to bed  Physical Exam Constitutional:      Appearance: Normal appearance.  HENT:     Head: Normocephalic and atraumatic.  Eyes:     Pupils: Pupils are equal, round, and reactive to light.  Cardiovascular:     Rate and Rhythm: Normal rate and regular rhythm.     Heart sounds: Normal heart sounds. No murmur heard.   Pulmonary:     Effort: Pulmonary effort is normal.     Breath sounds: Normal breath sounds. No wheezing.  Abdominal:     General: Bowel sounds are normal. There  is no distension.     Palpations: Abdomen is soft.     Tenderness: There is no abdominal tenderness.  Musculoskeletal:        General: Normal range of motion.     Cervical back: Normal range of motion.  Skin:    General: Skin is warm and dry.     Coloration: Skin is pale.     Findings: No rash.  Neurological:     Mental Status: She is alert and oriented to person, place, and time.  Psychiatric:        Judgment: Judgment normal.      LAB RESULTS:  Lab Results  Component Value Date   NA 135 08/17/2020   K 4.4 08/17/2020   CL 102 08/17/2020   CO2 23 08/17/2020   GLUCOSE 118 (H) 08/17/2020   BUN 32 (H) 08/17/2020   CREATININE 0.89 08/17/2020   CALCIUM 9.0 08/17/2020   PROT 6.5 08/17/2020   ALBUMIN 4.1 08/17/2020   AST 20 08/17/2020   ALT 12 08/17/2020   ALKPHOS 56 08/17/2020   BILITOT 0.3 08/17/2020   GFRNONAA >60 08/17/2020   GFRAA  06/29/2008    >60        The eGFR has been calculated using the MDRD equation. This calculation has not been validated in all clinical situations. eGFR's persistently <60 mL/min signify possible Chronic Kidney Disease.    Lab Results  Component  Value Date   WBC 4.6 08/17/2020   NEUTROABS 2.3 08/17/2020   HGB 8.0 (L) 08/17/2020   HCT 25.9 (L) 08/17/2020   MCV 79.7 (L) 08/17/2020   PLT 296 08/17/2020     STUDIES: US Venous Img Lower Unilateral Right (DVT)  Result Date: 08/03/2020 CLINICAL DATA:  Right lower extremity edema EXAM: Right LOWER EXTREMITY VENOUS DOPPLER ULTRASOUND TECHNIQUE: Gray-scale sonography with compression, as well as color and duplex ultrasound, were performed to evaluate the deep venous system(s) from the level of the common femoral vein through the popliteal and proximal calf veins. COMPARISON:  None. FINDINGS: VENOUS Normal compressibility of the common femoral, superficial femoral, and popliteal veins, as well as the visualized calf veins. Visualized portions of profunda femoral vein and great saphenous vein unremarkable. No filling defects to suggest DVT on grayscale or color Doppler imaging. Doppler waveforms show normal direction of venous flow, normal respiratory plasticity and response to augmentation. Limited views of the contralateral common femoral vein are unremarkable. OTHER None. Limitations: none IMPRESSION: Negative. Electronically Signed   By: Jacqulynn Cadet M.D.   On: 08/03/2020 17:12    ASSESSMENT: Patient presents for evaluation of microcytic anemia who recently had right knee replacement surgery.   Symptomatic anemia: Multifactorial-secondary to iron deficiency and recent total knee replacement with blood loss. CBC prior to surgery on 06/22/2020 shows a hemoglobin of 10.2, ferritin of 11 and iron saturations 7%. She was started on oral iron.  She has discontinued secondary to GI upset. She had total knee replacement surgery at the end of April 2022.  Repeat labs from 08/05/2020 show hemoglobin 7.9, iron saturation 5% and ferritin of 47. Denies any additional bleeding. She is very symptomatic with severe fatigue and hypotension.  Right total knee replacement surgery: Completed at the end  of April 2022. Stayed in a rehab facility for several weeks. Recently discharged home. Appears to be doing well. Has weekly physical therapy.   PLAN:   Symptomatic anemia: Repeat lab work today including CBC, CMP, copper, folate, B12, iron and ferritin, vitamin B12, hold tube Patient's  blood pressure is very low today. Recommend 500 mL of IV fluids. We will get her set up for 1 unit packed red blood cells given her symptoms.  She will then proceed with 3 doses of IV Venofer after her insurance is approved. She is scheduled for 1 unit packed red blood cells on 08/19/2020. She will then receive 3 doses of IV Venofer.  Disposition: Plan on having her return to clinic approximately 8 weeks post her IV Venofer to recheck labs and reassess.  Addendum: Repeat labs show a hemoglobin of 8.0 with an MCV of 79.7.  Vitamin B12 was 1082.  CMP showed BUN of 32.  Fluids as above.  Greater than 50% was spent in counseling and coordination of care with this patient including but not limited to discussion of the relevant topics above (See A&P) including, but not limited to diagnosis and management of acute and chronic medical conditions.   Patient expressed understanding and was in agreement with this plan. She also understands that She can call clinic at any time with any questions, concerns, or complaints.   Cancer Staging No matching staging information was found for the patient.  Jacquelin Hawking, NP   08/17/2020 4:06 PM

## 2020-08-18 ENCOUNTER — Encounter: Payer: Self-pay | Admitting: Oncology

## 2020-08-18 LAB — PREPARE RBC (CROSSMATCH)

## 2020-08-18 LAB — COPPER, SERUM: Copper: 116 ug/dL (ref 80–158)

## 2020-08-19 ENCOUNTER — Other Ambulatory Visit: Payer: Self-pay

## 2020-08-19 ENCOUNTER — Inpatient Hospital Stay: Payer: Medicare Other

## 2020-08-19 DIAGNOSIS — D649 Anemia, unspecified: Secondary | ICD-10-CM

## 2020-08-19 MED ORDER — SODIUM CHLORIDE 0.9% IV SOLUTION
250.0000 mL | Freq: Once | INTRAVENOUS | Status: AC
  Administered 2020-08-19: 250 mL via INTRAVENOUS
  Filled 2020-08-19: qty 250

## 2020-08-19 MED ORDER — DIPHENHYDRAMINE HCL 50 MG/ML IJ SOLN
25.0000 mg | Freq: Once | INTRAMUSCULAR | Status: AC
  Administered 2020-08-19: 25 mg via INTRAVENOUS
  Filled 2020-08-19: qty 1

## 2020-08-19 MED ORDER — ACETAMINOPHEN 325 MG PO TABS
650.0000 mg | ORAL_TABLET | Freq: Once | ORAL | Status: AC
  Administered 2020-08-19: 650 mg via ORAL
  Filled 2020-08-19: qty 2

## 2020-08-19 NOTE — Progress Notes (Signed)
Tolerated 1 unit PRBCs well. SBP elevated at end of transfusion. She denies shortness of breath, chest pain, headache. Took BP med this AM. Took pain medication for knee pain and feels it has worn off and that she got little sleep last night, contributing to higher BP today. Faythe Casa NP notified. OK to discharge home. Escorted to daughter's vehicle via wheelchair, returns next week to begin iron infusions.

## 2020-08-19 NOTE — Patient Instructions (Addendum)

## 2020-08-20 LAB — BPAM RBC
Blood Product Expiration Date: 202207072359
ISSUE DATE / TIME: 202206091004
Unit Type and Rh: 5100

## 2020-08-20 LAB — TYPE AND SCREEN
ABO/RH(D): O POS
Antibody Screen: NEGATIVE
Unit division: 0

## 2020-08-24 ENCOUNTER — Ambulatory Visit: Payer: Self-pay | Admitting: *Deleted

## 2020-08-24 NOTE — Telephone Encounter (Signed)
Pt returned call to report home covid test was negative.

## 2020-08-24 NOTE — Telephone Encounter (Addendum)
I returned the call to pt.   She had called in c/o having symptoms concerning for Covid that started last night with a very runny nose and sneezing.  This morning she is feeling "sicker than I've ever felt in a very long time".  She is c/o muscle aches, fever due to having chills, (can't find her thermometer), poor appetite, very fatigued.    Denies having a sore throat, diarrhea or vomiting.   She did get up during the night and eat some peanut butter without a problem with diarrhea or vomiting.    She just had a PRBC infusion on 6/92/022 and I scheduled to start iron infusions on 08/26/2020.   I let her know as long as she was sick they would not allow her to come in for an infusion especially if she is positive for Covid.    She recently had knee surgery in April and is very anemic reason she is starting iron infusions.    She is going to test herself for Covid now with a home test and call us back with the results.    I let her know I was going to send Jon Billings, NP a note letting Santiago Glad know about the pt's condition.    She is considered a high risk if she is positive for Covid.   She is agreeable to an antiviral if they feel that is what's best.   I sent my notes to [facility] high priority.  I also called into the office and let them know the pt's condition too.  Collene Mares is getting the information to Jon Billings, NP

## 2020-08-24 NOTE — Telephone Encounter (Signed)
Please see note from nurse

## 2020-08-24 NOTE — Telephone Encounter (Signed)
Patient returned call- she wants to know how the office is going to treat her symptoms- patient advised the message is awaiting a response from provider- call to office and they are aware of patient.   Patient added she has surgery on her knee 4/27 and wants to know if that could have effected her immune system. Advised it certainly could- but is has been a couple months and she should be building back up.

## 2020-08-24 NOTE — Telephone Encounter (Signed)
Reason for Disposition  [1] HIGH RISK for severe COVID complications (e.g., weak immune system, age > 50 years, obesity with BMI > 25, pregnant, chronic lung disease or other chronic medical condition) AND [2] COVID symptoms (e.g., cough, fever)  (Exceptions: Already seen by PCP and no new or worsening symptoms.)  Answer Assessment - Initial Assessment Questions 1. COVID-19 DIAGNOSIS: "Who made your COVID-19 diagnosis?" "Was it confirmed by a positive lab test or self-test?" If not diagnosed by a doctor (or NP/PA), ask "Are there lots of cases (community spread) where you live?" Note: See public health department website, if unsure.     Last night started sore muscles, sore throat, runny nose, fever, chills, no diarrhea, no headache,   no appetite.   I ate some peanut butter during the night.   I'm drinking orange juice.   2. COVID-19 EXPOSURE: "Was there any known exposure to COVID before the symptoms began?" CDC Definition of close contact: within 6 feet (2 meters) for a total of 15 minutes or more over a 24-hour period.      I've not been around anyone.     Wed. I had a blood infusion.   I'm scheduled to have an iron infusion on Tuesday.    Before the blood infusion I tested negative for Covid.    3. ONSET: "When did the COVID-19 symptoms start?"      Last night runny nose was the first symptom and sneezing and very fatigued.   4. WORST SYMPTOM: "What is your worst symptom?" (e.g., cough, fever, shortness of breath, muscle aches)     The runny nose and very tired.     5. COUGH: "Do you have a cough?" If Yes, ask: "How bad is the cough?"       A little dry cough 6. FEVER: "Do you have a fever?" If Yes, ask: "What is your temperature, how was it measured, and when did it start?"     Yes chills 7. RESPIRATORY STATUS: "Describe your breathing?" (e.g., shortness of breath, wheezing, unable to speak)      I'm feeling very winded.   No chest pain 8. BETTER-SAME-WORSE: "Are you getting better,  staying the same or getting worse compared to yesterday?"  If getting worse, ask, "In what way?"     Worse 9. HIGH RISK DISEASE: "Do you have any chronic medical problems?" (e.g., asthma, heart or lung disease, weak immune system, obesity, etc.)     Anemia  10. VACCINE: "Have you had the COVID-19 vaccine?" If Yes, ask: "Which one, how many shots, when did you get it?"       I had both shots 11. BOOSTER: "Have you received your COVID-19 booster?" If Yes, ask: "Which one and when did you get it?"       Also the latest booster.   I had a bad reaction to the 2nd booster. I had knee surgery in April.      I'm going to [facility] for [facility] for my knee. 12. PREGNANCY: "Is there any chance you are pregnant?" "When was your last menstrual period?"       N/A 13. OTHER SYMPTOMS: "Do you have any other symptoms?"  (e.g., chills, fatigue, headache, loss of smell or taste, muscle pain, sore throat)       See above   14. O2 SATURATION MONITOR:  "Do you use an oxygen saturation monitor (pulse oximeter) at home?" If Yes, ask "What is your reading (oxygen level) today?" "What is your usual  oxygen saturation reading?" (e.g., 95%)       Prior to knee surgery I was taking many supplements after surgery I did not resume them.    I need to resume my supplements.  Protocols used: Coronavirus (FMBBU-03) Diagnosed or Suspected-A-AH

## 2020-08-24 NOTE — Telephone Encounter (Signed)
Lvm with message bellow from PCP

## 2020-08-25 ENCOUNTER — Encounter: Payer: Self-pay | Admitting: Nurse Practitioner

## 2020-08-25 ENCOUNTER — Ambulatory Visit (INDEPENDENT_AMBULATORY_CARE_PROVIDER_SITE_OTHER): Payer: Self-pay | Admitting: Nurse Practitioner

## 2020-08-25 ENCOUNTER — Encounter: Payer: Self-pay | Admitting: Oncology

## 2020-08-25 DIAGNOSIS — J069 Acute upper respiratory infection, unspecified: Secondary | ICD-10-CM

## 2020-08-25 NOTE — Telephone Encounter (Signed)
Called pt to schedule virtual no answer left vm

## 2020-08-25 NOTE — Telephone Encounter (Signed)
Pt called back scheduled today

## 2020-08-25 NOTE — Telephone Encounter (Signed)
Please schedule a virtual visit for patient to discuss positive covid and concerns.

## 2020-08-25 NOTE — Telephone Encounter (Signed)
Patient can do a virtual visit to discuss her concerns further.

## 2020-08-25 NOTE — Progress Notes (Signed)
There were no vitals taken for this visit.   Subjective:    Patient ID: [name], female    DOB: 05-Jun-1940, 80 y.o.   MRN: [id number]  HPI: [name] is a 80 y.o. female  Chief Complaint  Patient presents with   URI    Started feeling bad Monday night, negative COVID test. Runny nose, cough, sneezing chills    UPPER RESPIRATORY TRACT INFECTION Worst symptom: runny nose and cough, negative COVID test Fever: yes but no thermometer Cough: yes Shortness of breath: no Wheezing: no Chest pain: yes, with cough Chest tightness: no Chest congestion: no Nasal congestion: yes Runny nose: yes Post nasal drip: no Sneezing: yes Sore throat: yes Swollen glands: no Sinus pressure: no Headache: no Face pain: no Toothache: no Ear pain: no bilateral Ear pressure: no bilateral Eyes red/itching:no Eye drainage/crusting: no  Vomiting: no Rash: no Fatigue: yes Sick contacts: no Strep contacts: no  Context: better Recurrent sinusitis: no Relief with OTC cold/cough medications: no  Treatments attempted: none   Relevant past medical, surgical, family and social history reviewed and updated as indicated. Interim medical history since our last visit reviewed. Allergies and medications reviewed and updated.  Review of Systems  Constitutional:  Negative for fatigue and fever.  HENT:  Positive for congestion, rhinorrhea and sneezing. Negative for dental problem, ear pain, postnasal drip, sinus pressure, sinus pain and sore throat.   Respiratory:  Negative for cough, shortness of breath and wheezing.   Cardiovascular:  Negative for chest pain.  Gastrointestinal:  Negative for vomiting.  Skin:  Negative for rash.  Neurological:  Negative for headaches.   Per HPI unless specifically indicated above     Objective:    There were no vitals taken for this visit.  Wt Readings from Last 3 Encounters:  08/17/20 190 lb (86.2 kg)  08/05/20 190 lb (86.2 kg)   06/22/20 193 lb (87.5 kg)    Physical Exam Vitals and nursing note reviewed.  Pulmonary:     Effort: Pulmonary effort is normal. No respiratory distress.  Neurological:     Mental Status: She is alert.  Psychiatric:        Mood and Affect: Mood normal.        Behavior: Behavior normal.        Thought Content: Thought content normal.        Judgment: Judgment normal.    Results for orders placed or performed in visit on 08/19/20  Prepare RBC (crossmatch)  Result Value Ref Range   Order Confirmation      ORDER PROCESSED BY BLOOD BANK Performed at [facility], [address] [place], [place] [address]       Assessment & Plan:   Problem List Items Addressed This Visit   None Visit Diagnoses     Viral upper respiratory tract infection    -  Primary   Reviewed symptom management at home. Recommend mucinex and tylenol PRN for symptoms. RTC if symptoms worsen or fail to improve.        Follow up plan: Return if symptoms worsen or fail to improve.  This visit was completed via MyChart due to the restrictions of the COVID-19 pandemic. All issues as above were discussed and addressed. Physical exam was done as above through visual confirmation on MyChart. If it was felt that the patient should be evaluated in the office, they were directed there. The patient verbally consented to this visit. Location of the patient: Home  Location of the provider: Office Those involved with this call:  Provider: Jon Billings, NP CMA: Tiffany Reel, CMA Front Desk/Registration: Jill Side Time spent on call: 16 minutes with patient face to face via video conference. More than 50% of this time was spent in counseling and coordination of care. 21 minutes total spent in review of patient's record and preparation of their chart.

## 2020-08-26 ENCOUNTER — Ambulatory Visit: Payer: Self-pay | Admitting: *Deleted

## 2020-08-26 ENCOUNTER — Inpatient Hospital Stay: Payer: Medicare Other

## 2020-08-26 NOTE — Telephone Encounter (Signed)
Please advise 

## 2020-08-26 NOTE — Telephone Encounter (Signed)
Spoke with patient to informed her that she may be experiencing pain in her leg from still healing from her knee replacement. Patient states she was not having pain until she attracted this virus. Advised patient that she may be having pain as well due to her being weak from not feeling well. Patient states she is going to attempted to reach out to the [facility] office again. Patient was advised to give our office a call back if she has any questions after speaking with their office. Verbalized understanding.

## 2020-08-26 NOTE — Telephone Encounter (Signed)
Pt had virtual appt with [name] yesterday, states she had forgotten to mention burning sensation right leg, from foot down. Onset 3 days ago. Right leg with knee replacement 4/22. States "Burning sensation, toes tingling slight swelling "But that is not new, comes and goes, back now." Pt is questioning "Could this be from the virus settled in there." States at night she applies cool towel to leg that provides relief. Also questioning if she should take the Gabapentin and tramadol prescribed by surgeon. "I took 10 of them after surgery and have some left." Asked if she had alerted surgeon states she did call today but has not heard back.  Again states she forgot to mention this at appt yesterday and would like to hear opinion if this could be from virus. WOuld like [name] to know she had to cancel Fe infusion appt today.  CB# [phone]    Reason for Disposition  [1] MODERATE pain (e.g., interferes with normal activities, limping) AND [2] present > 3 days  Answer Assessment - Initial Assessment Questions 1. ONSET: "When did the pain start?"      3 days ago 2. LOCATION: "Where is the pain located?"      Right leg from knee down 3. PAIN: "How bad is the pain?"    (Scale 1-10; or mild, moderate, severe)   -  MILD (1-3): doesn't interfere with normal activities    -  MODERATE (4-7): interferes with normal activities (e.g., work or school) or awakens from sleep, limping    -  SEVERE (8-10): excruciating pain, unable to do any normal activities, unable to walk    Burning sensation 4. WORK OR EXERCISE: "Has there been any recent work or exercise that involved this part of the body?"      Knee replacement 4/22 5. CAUSE: "What do you think is causing the leg pain?"     "Virus" 6. OTHER SYMPTOMS: "Do you have any other symptoms?" (e.g., chest pain, back pain, breathing difficulty, swelling, rash, fever, numbness, weakness)     No  Protocols used: Leg Pain-A-AH

## 2020-08-26 NOTE — Telephone Encounter (Signed)
This is likely not from the virus but from the leg still healing from the knee replacement.

## 2020-08-30 ENCOUNTER — Ambulatory Visit: Payer: Self-pay | Admitting: *Deleted

## 2020-08-30 NOTE — Telephone Encounter (Signed)
Answer Assessment - Initial Assessment Questions 1. ONSET: "When did the swelling start?" (e.g., minutes, hours, days)     Na  2. LOCATION: "What part of the tongue is swollen?"     Tip of tongue , tongue burning  3. SEVERITY: "How swollen is it?"     na 4. CAUSE: "What do you think is causing the tongue swelling?" (e.g., hx of angioedema, allergies)     Does not know  5. RECURRENT SYMPTOM: "Have you had tongue swelling before?" If Yes, ask: "When was the last time?" "What happened that time?"     na 6. OTHER SYMPTOMS: "Do you have any other symptoms?" (e.g., difficulty breathing, facial swelling)     Not feeling well, coughing, sneezing, nose runny, body aches, weakness 7. PREGNANCY: "Is there any chance you are pregnant?" "When was your last menstrual period?"     na  Protocols used: Tongue Swelling-A-AH

## 2020-08-30 NOTE — Telephone Encounter (Signed)
Please advise 

## 2020-08-30 NOTE — Telephone Encounter (Signed)
C/o "not feeling well" and patient is missing her physical therapy for her knee. C/o tongue swollen on the tip, red, and burning. Lips are red , denies swelling. Reports she continues to have symptoms of coughing, sneezing, runny nose, "feels warm", and body is sore, and weak. Patient took at home covid test and was negative last Monday. Last seen by PCP 08/25/20 and patient reports her symptoms are no better. Has been taking mucinex and she continues with symptoms and now tip of tongue burning and swelling. No available virtual visits or office visits today. Patient did not want to wait until appt open on 09/01/20 with PCP. Please advise. Care advise given. Patient verbalized understanding of care advise and to call back or go to [facility] or ED if symptoms worsen. Patient anxious and would like a call back from PCP.

## 2020-08-30 NOTE — Telephone Encounter (Signed)
As discussed during visit, it can take 2 weeks for patient to see symptoms improve.  I am unable to determine the cause of tongue swelling and soreness without seeing the patient. Please schedule her an appointment.

## 2020-08-30 NOTE — Telephone Encounter (Signed)
Patient scheduled to come into the office tomorrow

## 2020-08-31 ENCOUNTER — Encounter: Payer: Self-pay | Admitting: Nurse Practitioner

## 2020-08-31 ENCOUNTER — Other Ambulatory Visit: Payer: Self-pay

## 2020-08-31 ENCOUNTER — Ambulatory Visit (INDEPENDENT_AMBULATORY_CARE_PROVIDER_SITE_OTHER): Payer: Self-pay | Admitting: Nurse Practitioner

## 2020-08-31 ENCOUNTER — Telehealth: Payer: Self-pay

## 2020-08-31 VITALS — BP 144/58 | HR 75 | Temp 98.0°F | Wt 190.0 lb

## 2020-08-31 DIAGNOSIS — K146 Glossodynia: Secondary | ICD-10-CM

## 2020-08-31 DIAGNOSIS — R350 Frequency of micturition: Secondary | ICD-10-CM

## 2020-08-31 LAB — MICROSCOPIC EXAMINATION: Renal Epithel, UA: NONE SEEN /hpf

## 2020-08-31 LAB — URINALYSIS, ROUTINE W REFLEX MICROSCOPIC
Bilirubin, UA: NEGATIVE
Glucose, UA: NEGATIVE
Nitrite, UA: POSITIVE — AB
Specific Gravity, UA: 1.01 (ref 1.005–1.030)
Urobilinogen, Ur: 0.2 mg/dL (ref 0.2–1.0)
pH, UA: 5.5 (ref 5.0–7.5)

## 2020-08-31 MED ORDER — LIDOCAINE VISCOUS HCL 2 % MT SOLN: 15.0000 mL | OROMUCOSAL | 0 refills | Status: DC | PRN

## 2020-08-31 MED ORDER — NITROFURANTOIN MONOHYD MACRO 100 MG PO CAPS: 100.0000 mg | ORAL_CAPSULE | Freq: Two times a day (BID) | ORAL | 0 refills | Status: DC

## 2020-08-31 NOTE — Progress Notes (Signed)
BP (!) 144/58   Pulse 75   Temp 98 F (36.7 C)   Wt 190 lb (86.2 kg)   SpO2 99%   BMI 33.66 kg/m    Subjective:    Patient ID: [name], female    DOB: 21-Sep-1940, 80 y.o.   MRN: [id number]  HPI: [name] is a 80 y.o. female  Chief Complaint  Patient presents with   tongue soreness    Lips as well   hand peeling    Bilateral    Insomnia   Cough    Dry, nasal congestion, is better than it was when it started. Shortness of breath with walking. Patient states that she has had been having hot and cold flashes and bodyaches. Started a week ago Saturday so on day 10.    Patient states she is having tongue sores since her viral congestion started.  The tongue sores started last week. Patient states they are on the side of her tongue.  Patient is not sleeping well because she is waking up coughing and sneezing.     Relevant past medical, surgical, family and social history reviewed and updated as indicated. Interim medical history since our last visit reviewed. Allergies and medications reviewed and updated.  Review of Systems  Constitutional:  Positive for fatigue.  HENT:  Positive for congestion, mouth sores and sneezing.   Respiratory:  Positive for cough.    Per HPI unless specifically indicated above     Objective:    BP (!) 144/58   Pulse 75   Temp 98 F (36.7 C)   Wt 190 lb (86.2 kg)   SpO2 99%   BMI 33.66 kg/m   Wt Readings from Last 3 Encounters:  08/31/20 190 lb (86.2 kg)  08/17/20 190 lb (86.2 kg)  08/05/20 190 lb (86.2 kg)    Physical Exam Vitals and nursing note reviewed.  Constitutional:      General: She is not in acute distress.    Appearance: Normal appearance. She is normal weight. She is not ill-appearing, toxic-appearing or diaphoretic.  HENT:     Head: Normocephalic.     Right Ear: External ear normal.     Left Ear: External ear normal.     Nose: Nose normal.     Mouth/Throat:     Mouth: Mucous membranes are  moist.     Tongue: No lesions. Tongue does not deviate from midline.     Palate: No mass and lesions.     Pharynx: Oropharynx is clear.  Eyes:     General:        Right eye: No discharge.        Left eye: No discharge.     Extraocular Movements: Extraocular movements intact.     Conjunctiva/sclera: Conjunctivae normal.     Pupils: Pupils are equal, round, and reactive to light.  Cardiovascular:     Rate and Rhythm: Normal rate and regular rhythm.     Heart sounds: No murmur heard. Pulmonary:     Effort: Pulmonary effort is normal. No respiratory distress.     Breath sounds: Normal breath sounds. No wheezing or rales.  Musculoskeletal:     Cervical back: Normal range of motion and neck supple.  Skin:    General: Skin is warm and dry.     Capillary Refill: Capillary refill takes less than 2 seconds.  Neurological:     General: No focal deficit present.     Mental Status: She is  alert and oriented to person, place, and time. Mental status is at baseline.  Psychiatric:        Mood and Affect: Mood normal.        Behavior: Behavior normal.        Thought Content: Thought content normal.        Judgment: Judgment normal.    Results for orders placed or performed in visit on 08/19/20  Prepare RBC (crossmatch)  Result Value Ref Range   Order Confirmation      ORDER PROCESSED BY BLOOD BANK Performed at [facility], [address] [place], [place] [address]       Assessment & Plan:   Problem List Items Addressed This Visit   None Visit Diagnoses     Urinary frequency    -  Primary   UA/UC drawn during visit today. Will treat patient based on results.   Relevant Orders   Urinalysis, Routine w reflex microscopic   Urine Culture   Soreness of tongue       Viscous lidocaine sent to the pharmacy for patient to use PRN for mouth sores.         Follow up plan: Return if symptoms worsen or fail to improve.   A total of 20 minutes were spent on this encounter  today.  When total time is documented, this includes both the face-to-face and non-face-to-face time personally spent before, during and after the visit on the date of the encounter.

## 2020-08-31 NOTE — Telephone Encounter (Signed)
Disregard. Medication was sent to [facility] at patient's request.

## 2020-08-31 NOTE — Telephone Encounter (Signed)
Copied from [place] [id number]. Topic: General - Other >> Aug 31, 2020 10:56 AM Yvette Rack wrote: Reason for CRM: Katelyn with [facility] called regarding Rx for lidocaine (XYLOCAINE) 2 % solution. Cb# [phone]

## 2020-08-31 NOTE — Progress Notes (Signed)
Please let patient know it looks like she has a UTI. I have sent in [place] to the pharmacy to treat this.  If I need to change the medication we will call her and left her know.

## 2020-08-31 NOTE — Addendum Note (Signed)
Addended by: Jon Billings on: 08/31/2020 08:38 PM   Modules accepted: Orders

## 2020-09-01 ENCOUNTER — Telehealth: Payer: Self-pay

## 2020-09-01 NOTE — Telephone Encounter (Signed)
See result note. Tried calling patient to discuss result. Waiting for a returned call.

## 2020-09-01 NOTE — Telephone Encounter (Signed)
Copied from [place] [id number]. Topic: General - Other >> Aug 31, 2020  4:21 PM Wynetta Emery, [place] C wrote: Reason for CRM: pt called in for her urine results.

## 2020-09-02 ENCOUNTER — Ambulatory Visit: Payer: Medicare Other

## 2020-09-03 ENCOUNTER — Ambulatory Visit: Payer: Self-pay | Admitting: *Deleted

## 2020-09-03 MED ORDER — CIPROFLOXACIN HCL 250 MG PO TABS: 250.0000 mg | ORAL_TABLET | Freq: Two times a day (BID) | ORAL | 0 refills | Status: AC

## 2020-09-03 NOTE — Telephone Encounter (Signed)
Patient aware of provider advise no further questions.

## 2020-09-03 NOTE — Telephone Encounter (Signed)
Per other note PCP stated she should keep iron infusion.

## 2020-09-03 NOTE — Telephone Encounter (Signed)
Patient should get her iron infusion.  Continue with tylenol and mucinex for viral symptoms.

## 2020-09-03 NOTE — Telephone Encounter (Signed)
Patient is calling to report she has had increased symptoms- possible SE since starting the Nitrofurantion. Patient states her urinary symptoms are worse- incontinence of urine now, joint pain, weakness. Patient advised to stop medication until she gets call back from office- Patient states she can not come to office- she has no transportation.

## 2020-09-03 NOTE — Telephone Encounter (Signed)
Patient can stop taking Macrobid.  I have sent a new prescription for Ciprofloxacin to treat her UTI. It was sent to [facility]. It is twice daily for 3 days.

## 2020-09-03 NOTE — Telephone Encounter (Signed)
Reason for Disposition  [1] Caller has URGENT medicine question about med that PCP or specialist prescribed AND [2] triager unable to answer question  Answer Assessment - Initial Assessment Questions 1. NAME of MEDICATION: "What medicine are you calling about?"     Nitrofurantoin  2. QUESTION: "What is your question?" (e.g., double dose of medicine, side effect)     Medication reaction 3. PRESCRIBING HCP: "Who prescribed it?" Reason: if prescribed by specialist, call should be referred to that group.     PCP 4. SYMPTOMS: "Do you have any symptoms?"     Uncontrolled urination, weakness, tongue dark red in both sides, hands peeling, leg and joint pain 5. SEVERITY: If symptoms are present, ask "Are they mild, moderate or severe?"     Started medication yesterday 6. PREGNANCY:  "Is there any chance that you are pregnant?" "When was your last menstrual period?"     N/a  Protocols used: Medication Question Call-A-AH

## 2020-09-03 NOTE — Telephone Encounter (Signed)
Pt called in for assistance. Pt says that she spoke to the pharmacist in regards to the new medication that she was prescribed. Pt says that it causes nerve, tendence  and  affect the muscle. Pt says that she already have this concern. Pt would also like to be advised on if she still should have a iron infusion that she is scheduled for on 6/30.    Please assist pt further.

## 2020-09-03 NOTE — Addendum Note (Signed)
Addended by: Jon Billings on: 09/03/2020 12:52 PM   Modules accepted: Orders

## 2020-09-03 NOTE — Progress Notes (Signed)
Results discussed via telephone encounter. Medication changed to Cipro.

## 2020-09-04 LAB — URINE CULTURE

## 2020-09-06 ENCOUNTER — Ambulatory Visit: Payer: Self-pay | Admitting: *Deleted

## 2020-09-06 NOTE — Telephone Encounter (Signed)
Per agent: "Pt wanted to talk to a nurse regarding having an adverse reaction to ciprofloxacin (CIPRO) 250 MG tablet. Pt also stated she tested negative for Covid. Pt had many follow up questions. "    Pt verbalizes multiple issues. States has 1 day, 2 doses left of Cipro for UTI. States "HAving same symptoms as I did with other medication that was stopped." Reports joint pain and "Dragging" SOB at times. States called pharmacist and advised to not continue with today's doses, but call PCP. States had 2 incontinence episodes last night. Denies fever.Questioning if she could stop med as only one day left. Also questioning if URI of 08/25/20 could be from booster she received 08/12/20. Still has cough, taking Mucinex. Would like Santiago Glad to know her tongue is the same. Denies any fever "But I have chills" REports did covid test, negative. BP today 155/70.  Please advise: [phone]  Reason for Disposition  [1] Caller has URGENT medicine question about med that PCP or specialist prescribed AND [2] triager unable to answer question  Answer Assessment - Initial Assessment Questions 1. NAME of MEDICATION: "What medicine are you calling about?"     Cipro 2. QUESTION: "What is your question?" (e.g., double dose of medicine, side effect)     Many. Please see triage summary 3. PRESCRIBING HCP: "Who prescribed it?" Reason: if prescribed by specialist, call should be referred to that group.   PCP 4. SYMPTOMS: "Do you have any symptoms?"     Incontinence at HS, joint pain "Dragging" 5. SEVERITY: If symptoms are present, ask "Are they mild, moderate or severe?"    Varies  Protocols used: Medication Question Call-A-AH

## 2020-09-06 NOTE — Telephone Encounter (Signed)
Patient should complete the course of the medication. She only has one day left and the benefits out weigh the risks. I can place a referral for patient to see urology for the incontinence.

## 2020-09-07 ENCOUNTER — Telehealth: Payer: Self-pay

## 2020-09-07 NOTE — Telephone Encounter (Signed)
Patient's voicemail is full and cannot accept new messages.

## 2020-09-08 ENCOUNTER — Ambulatory Visit: Payer: Self-pay | Admitting: *Deleted

## 2020-09-08 NOTE — Telephone Encounter (Signed)
Please advise pt scheduled tomorrow

## 2020-09-08 NOTE — Telephone Encounter (Signed)
I explained this to the patient with the initial call but she was insistent on seeing a provider.

## 2020-09-08 NOTE — Telephone Encounter (Signed)
Calls with SOB with activity and rest. Feels faint today along with "severe weakness" for "awhile now". She is scheduled for iron infusion tomorrow. Patient unable to provide specifics at this time just states she needs to see provider before iron infusion tomorrow.  No in office availability with Santiago Glad. Placed her appointment with Lauren tomorrow at 11:00 am.  She refuses to go to the ED for evaluation at this time.    Reason for Disposition  [1] MODERATE difficulty breathing (e.g., speaks in phrases, SOB even at rest, pulse 100-120) AND [2] NEW-onset or WORSE than normal  Answer Assessment - Initial Assessment Questions 1. RESPIRATORY STATUS: "Describe your breathing?" (e.g., wheezing, shortness of breath, unable to speak, severe coughing)      Short of breath eve while resting 2. ONSET: "When did this breathing problem begin?"      One week or longer 3. PATTERN "Does the difficult breathing come and go, or has it been constant since it started?"      Comes and goes 4. SEVERITY: "How bad is your breathing?" (e.g., mild, moderate, severe)    - MILD: No SOB at rest, mild SOB with walking, speaks normally in sentences, can lie down, no retractions, pulse < 100.    - MODERATE: SOB at rest, SOB with minimal exertion and prefers to sit, cannot lie down flat, speaks in phrases, mild retractions, audible wheezing, pulse 100-120.    - SEVERE: Very SOB at rest, speaks in single words, struggling to breathe, sitting hunched forward, retractions, pulse > 120      moderate 5. RECURRENT SYMPTOM: "Have you had difficulty breathing before?" If Yes, ask: "When was the last time?" and "What happened that time?"      Yes 6. CARDIAC HISTORY: "Do you have any history of heart disease?" (e.g., heart attack, angina, bypass surgery, angioplasty)      yes 7. LUNG HISTORY: "Do you have any history of lung disease?"  (e.g., pulmonary embolus, asthma, emphysema)     yes 8. CAUSE: "What do you think is causing the  breathing problem?"      virus 9. OTHER SYMPTOMS: "Do you have any other symptoms? (e.g., dizziness, runny nose, cough, chest pain, fever)      10. O2 SATURATION MONITOR:  "Do you use an oxygen saturation monitor (pulse oximeter) at home?" If Yes, "What is your reading (oxygen level) today?" "What is your usual oxygen saturation reading?" (e.g., 95%)       No monitor 11. PREGNANCY: "Is there any chance you are pregnant?" "When was your last menstrual period?"       na 12. TRAVEL: "Have you traveled out of the country in the last month?" (e.g., travel history, exposures)       no  Protocols used: Breathing Difficulty-A-AH

## 2020-09-08 NOTE — Telephone Encounter (Signed)
Patient's SOB and fatigue can be due to needing her iron infusion.  These can be signs of anemia and she is overdue for her iron.

## 2020-09-09 ENCOUNTER — Inpatient Hospital Stay: Payer: Medicare Other

## 2020-09-09 ENCOUNTER — Encounter: Payer: Self-pay | Admitting: Nurse Practitioner

## 2020-09-09 ENCOUNTER — Other Ambulatory Visit: Payer: Self-pay

## 2020-09-09 ENCOUNTER — Ambulatory Visit (INDEPENDENT_AMBULATORY_CARE_PROVIDER_SITE_OTHER): Payer: Self-pay | Admitting: Nurse Practitioner

## 2020-09-09 VITALS — BP 186/59 | HR 68 | Temp 98.6°F | Resp 20

## 2020-09-09 VITALS — BP 152/72 | HR 72 | Temp 98.0°F | Wt 190.0 lb

## 2020-09-09 DIAGNOSIS — D649 Anemia, unspecified: Secondary | ICD-10-CM

## 2020-09-09 DIAGNOSIS — R21 Rash and other nonspecific skin eruption: Secondary | ICD-10-CM

## 2020-09-09 DIAGNOSIS — R202 Paresthesia of skin: Secondary | ICD-10-CM

## 2020-09-09 DIAGNOSIS — M170 Bilateral primary osteoarthritis of knee: Secondary | ICD-10-CM

## 2020-09-09 DIAGNOSIS — I350 Nonrheumatic aortic (valve) stenosis: Secondary | ICD-10-CM

## 2020-09-09 DIAGNOSIS — G629 Polyneuropathy, unspecified: Secondary | ICD-10-CM

## 2020-09-09 MED ORDER — IRON SUCROSE 20 MG/ML IV SOLN
200.0000 mg | Freq: Once | INTRAVENOUS | Status: AC
  Administered 2020-09-09: 200 mg via INTRAVENOUS
  Filled 2020-09-09: qty 10

## 2020-09-09 MED ORDER — SODIUM CHLORIDE 0.9 % IV SOLN
Freq: Once | INTRAVENOUS | Status: AC
Start: 2020-09-09 — End: 2020-09-09
  Filled 2020-09-09: qty 250

## 2020-09-09 MED ORDER — SODIUM CHLORIDE 0.9 % IV SOLN: 200.0000 mg | Freq: Once | INTRAVENOUS | Status: DC

## 2020-09-09 MED ORDER — GABAPENTIN 100 MG PO CAPS: 100.0000 mg | ORAL_CAPSULE | Freq: Every day | ORAL | 0 refills | Status: DC

## 2020-09-09 MED ORDER — TRIAMCINOLONE ACETONIDE 0.1 % EX CREA: 1.0000 "application " | TOPICAL_CREAM | Freq: Two times a day (BID) | CUTANEOUS | 0 refills | Status: DC

## 2020-09-09 NOTE — Assessment & Plan Note (Signed)
Chronic, ongoing. Echocardiogram in 05/2020 showed mild aortic stenosis. Per cardiology, this is cause of murmur. Continue collaboration and routine follow-up with cardiology.

## 2020-09-09 NOTE — Assessment & Plan Note (Signed)
Received outpatient blood transfusion last month for symptomatic anemia. She is scheduled for serial infusions for her iron deficiency anemia. Continue collaboration and recommendations by hematology. This can be partial cause of dizziness and weakness. Also encouraged her to drink plenty of water or non-caffeinated beverages.

## 2020-09-09 NOTE — Assessment & Plan Note (Signed)
Noted in the last few months in bilateral feet after total knee replacement. Will check CMP, CBC, and vitamin B12 today. Will start gabapentin 100 mg QHS, may increase to 2 capsules after 3 days. Follow-up with PCP at scheduled appointment next month.

## 2020-09-09 NOTE — Assessment & Plan Note (Signed)
Edema noted from right foot to right knee. She states that it starts swelling since her surgery when she doesn't have her foot elevated. Encouraged her to wear her compression socks and elevated her legs when sitting. Follow-up with [facility] with any concerns.

## 2020-09-09 NOTE — Progress Notes (Signed)
Acute Office Visit  Subjective:    Patient ID: [name], female    DOB: 23-Dec-1940, 80 y.o.   MRN: [id number]  Chief Complaint  Patient presents with   Fatigue   Cough    Patient states she is still having some coughing and sneezing lingering around from the virus she had. Patient states Monday she woke extreme joint aches, earache in her right ear and congestion in her head. Patient states she is feeling a lot better today than she was yesterday. Patient states she has been inactive and states she has been unable to perform at home self therapy due to weakness. Patient states she thinks she may have Neuropathy and not sure how she needs to go about being tested for it.    Leg Pain    Patient states she is noticing leg pain in the leg that she has recent surgery on. Patient states she is currently taking Tylenol to help with her pain.    Dizziness    Patient states she has some dizziness when she got up to go take a shower.    HPI Patient is in today for fatigue, cough, leg pain, and dizziness. She states she was diagnosed with a viral upper respiratory infection 2 weeks ago. Her symptoms worsened on Monday again, however today they are resolving. She has noticed some increased pain in her right leg and numbness and tingling in both feet. She tried wrapping her legs in a warm towel, however that didn't help with the pain. The numbness and tingling tends to be worse at night and is giving her trouble sleeping. She has also tried taking tylenol with minimal relief. She thinks she might have neuropathy.   [name] also endorses fatigue and dizziness. She states that she sometimes gets dizzy when standing up. She has gotten a blood transfusion recently after her knee replacement surgery and she is scheduled to get iron infusions. Her next infusion is this afternoon. She denies any chest pain or shortness of breath.    Past Medical History:  Diagnosis Date   Anemia    Colon polyp     History of small bowel obstruction 9/09   likely due to adhesions- pt refused most dx and tx in hospital   History of vaginal bleeding    post menopausal- gyn eval, ? polyp   Hyperlipidemia    Osteoarthritis    knee   Schizoaffective disorder    paranoid personality- refuses treatment   Vitamin D deficiency     Past Surgical History:  Procedure Laterality Date   ANTERIOR AND POSTERIOR REPAIR N/A 04/23/2012   Procedure: ANTERIOR   REPAIR CYSTOCELE;  Surgeon: Reece Packer, MD;  Location: [facility];  Service: Urology;  Laterality: N/A;  cysto;graft 10x6   APPENDECTOMY     bladder tack  1976   KNEE ARTHROSCOPY     right   KNEE ARTHROSCOPY Right    LAPAROSCOPY  1970's   twisted fallopian tube   SALPINGOOPHORECTOMY Bilateral 04/23/2012   Procedure: SALPINGO OOPHORECTOMY;  Surgeon: Emily Filbert, MD;  Location: [facility];  Service: Gynecology;  Laterality: Bilateral;   TONSILLECTOMY     VAGINAL HYSTERECTOMY N/A 04/23/2012   Procedure: HYSTERECTOMY VAGINAL;  Surgeon: Emily Filbert, MD;  Location: [facility];  Service: Gynecology;  Laterality: N/A;   VAGINAL PROLAPSE REPAIR N/A 04/23/2012   Procedure: VAGINAL VAULT SUSPENSION;  Surgeon: Reece Packer, MD;  Location: [facility];  Service: Urology;  Laterality: N/A;  Family History  Problem Relation Age of Onset   Heart disease Mother    Diabetes Mother    Skin cancer Mother    Diabetes Father    Heart disease Father    Hypertension Father    Diabetes Brother    Arthritis Brother    Hypertension Brother    Hyperlipidemia Brother    Arthritis Daughter    Cancer Son    Obesity Daughter     Social History   Socioeconomic History   Marital status: Widowed    Spouse name: Not on file   Number of children: 3   Years of education: Not on file   Highest education level: Not on file  Occupational History   Occupation: retired Pharmacist, hospital  Tobacco Use   Smoking status: Former    Packs/day: 0.50    Years: 20.00    Pack years: 10.00     Types: Cigarettes    Quit date: 03/13/1982    Years since quitting: 38.5   Smokeless tobacco: Never  Vaping Use   Vaping Use: Never used  Substance and Sexual Activity   Alcohol use: No   Drug use: No   Sexual activity: Yes    Birth control/protection: Post-menopausal  Other Topics Concern   Not on file  Social History Narrative   Lives alone   Social Determinants of Health   Financial Resource Strain: Not on file  Food Insecurity: Not on file  Transportation Needs: Not on file  Physical Activity: Not on file  Stress: Not on file  Social Connections: Not on file  Intimate Partner Violence: Not on file    Outpatient Medications Prior to Visit  Medication Sig Dispense Refill   guaiFENesin (MUCINEX) 600 MG 12 hr tablet Take by mouth 2 (two) times daily.     lidocaine (XYLOCAINE) 2 % solution Use as directed 15 mLs in the mouth or throat as needed for mouth pain. 100 mL 0   lisinopril (ZESTRIL) 20 MG tablet Take 1 tablet (20 mg total) by mouth daily. 90 tablet 1   nitrofurantoin, macrocrystal-monohydrate, (MACROBID) 100 MG capsule Take 1 capsule (100 mg total) by mouth 2 (two) times daily. 10 capsule 0   rosuvastatin (CRESTOR) 5 MG tablet Take 1 tablet (5 mg total) by mouth daily. 90 tablet 1   No facility-administered medications prior to visit.    Allergies  Allergen Reactions   Penicillins Rash    Review of Systems  Constitutional:  Positive for fatigue. Negative for fever.  HENT:  Positive for congestion (resolving).   Respiratory:  Positive for cough (resolving). Negative for shortness of breath.   Cardiovascular:  Positive for leg swelling (right knee/foot since her surgery). Negative for chest pain.  Gastrointestinal: Negative.   Genitourinary: Negative.   Musculoskeletal:  Positive for arthralgias.  Skin:  Positive for rash (scaly, dry skin to right and left palm for several weeks).  Neurological:  Positive for dizziness (sometimes with changes in position),  weakness and numbness (and tingling in both feet). Negative for headaches.  Psychiatric/Behavioral: Negative.        Objective:    Physical Exam Vitals and nursing note reviewed.  Constitutional:      General: She is not in acute distress.    Appearance: Normal appearance.  HENT:     Head: Normocephalic.  Eyes:     Conjunctiva/sclera: Conjunctivae normal.  Cardiovascular:     Rate and Rhythm: Normal rate and regular rhythm.     Pulses: Normal pulses.  Heart sounds: Murmur heard.  Pulmonary:     Effort: Pulmonary effort is normal.     Breath sounds: Normal breath sounds.  Musculoskeletal:     Cervical back: Normal range of motion.     Right lower leg: Edema (2+ from foot to knee) present.     Left lower leg: No edema.  Skin:    General: Skin is warm and dry.     Comments: Scaly, dry skin to right and left palms  Neurological:     General: No focal deficit present.     Mental Status: She is alert and oriented to person, place, and time.  Psychiatric:        Mood and Affect: Mood normal.        Behavior: Behavior normal.        Thought Content: Thought content normal.        Judgment: Judgment normal.    BP (!) 152/72 (BP Location: Left Arm, Cuff Size: Normal)   Pulse 72   Temp 98 F (36.7 C) (Oral)   Wt 190 lb (86.2 kg)   SpO2 98%   BMI 33.66 kg/m  Wt Readings from Last 3 Encounters:  09/09/20 190 lb (86.2 kg)  08/31/20 190 lb (86.2 kg)  08/17/20 190 lb (86.2 kg)    Health Maintenance Due  Topic Date Due   Zoster Vaccines- Shingrix (1 of 2) Never done   PNA vac Low Risk Adult (1 of 2 - PCV13) Never done   COVID-19 Vaccine (3 - Booster for Janssen series) 05/30/2020    There are no preventive care reminders to display for this patient.   Lab Results  Component Value Date   TSH 2.760 05/10/2020   Lab Results  Component Value Date   WBC 4.6 08/17/2020   HGB 8.0 (L) 08/17/2020   HCT 25.9 (L) 08/17/2020   MCV 79.7 (L) 08/17/2020   PLT 296  08/17/2020   Lab Results  Component Value Date   NA 135 08/17/2020   K 4.4 08/17/2020   CO2 23 08/17/2020   GLUCOSE 118 (H) 08/17/2020   BUN 32 (H) 08/17/2020   CREATININE 0.89 08/17/2020   BILITOT 0.3 08/17/2020   ALKPHOS 56 08/17/2020   AST 20 08/17/2020   ALT 12 08/17/2020   PROT 6.5 08/17/2020   ALBUMIN 4.1 08/17/2020   CALCIUM 9.0 08/17/2020   ANIONGAP 10 08/17/2020   EGFR 64 05/17/2020   GFR 92.70 12/28/2014   Lab Results  Component Value Date   CHOL 267 (H) 05/10/2020   Lab Results  Component Value Date   HDL 67 05/10/2020   Lab Results  Component Value Date   LDLCALC 173 (H) 05/10/2020   Lab Results  Component Value Date   TRIG 148 05/10/2020   Lab Results  Component Value Date   CHOLHDL 4.0 05/10/2020   Lab Results  Component Value Date   HGBA1C 5.8 (H) 05/10/2020       Assessment & Plan:   Problem List Items Addressed This Visit       Cardiovascular and Mediastinum   Aortic valve stenosis    Chronic, ongoing. Echocardiogram in 05/2020 showed mild aortic stenosis. Per cardiology, this is cause of murmur. Continue collaboration and routine follow-up with cardiology.          Nervous and Auditory   Neuropathy - Primary    Noted in the last few months in bilateral feet after total knee replacement. Will check CMP, CBC, and vitamin B12 today.  Will start gabapentin 100 mg QHS, may increase to 2 capsules after 3 days. Follow-up with PCP at scheduled appointment next month.        Relevant Orders   Comp Met (CMET)   CBC with Differential     Musculoskeletal and Integument   Primary osteoarthritis of both knees    Edema noted from right foot to right knee. She states that it starts swelling since her surgery when she doesn't have her foot elevated. Encouraged her to wear her compression socks and elevated her legs when sitting. Follow-up with [facility] with any concerns.          Other   Anemia    Received outpatient blood transfusion  last month for symptomatic anemia. She is scheduled for serial infusions for her iron deficiency anemia. Continue collaboration and recommendations by hematology. This can be partial cause of dizziness and weakness. Also encouraged her to drink plenty of water or non-caffeinated beverages.        Other Visit Diagnoses     Tingling in extremities       Will check vitamin B12. See plan for neuropathy   Relevant Orders   Vitamin B12   Rash       Scaly, dry skin to right and left palms. Will treat with triamcinolone cream. Keep f/u appointment with PCP next month        Meds ordered this encounter  Medications   gabapentin (NEURONTIN) 100 MG capsule    Sig: Take 1 capsule (100 mg total) by mouth at bedtime. Take 1 capsule at bedtime. If no relief, can go up to 2 tablets at bedtime after 3 days.    Dispense:  90 capsule    Refill:  0   triamcinolone cream (KENALOG) 0.1 %    Sig: Apply 1 application topically 2 (two) times daily.    Dispense:  30 g    Refill:  0      Charyl Dancer, NP

## 2020-09-09 NOTE — Patient Instructions (Signed)
Start gabapentin at bedtime. 1 capsule nightly, can increase to 2 after 3 days Check your CBC, CMP, and vitamin B12 levels Triamcinolone cream to your hands twice a day Keep your appointment with Santiago Glad next month Keep your appointments for your infusion

## 2020-09-09 NOTE — Patient Instructions (Signed)

## 2020-09-10 LAB — CBC WITH DIFFERENTIAL/PLATELET
Basophils Absolute: 0 10*3/uL (ref 0.0–0.2)
Basos: 1 %
EOS (ABSOLUTE): 0.3 10*3/uL (ref 0.0–0.4)
Eos: 4 %
Hematocrit: 33.5 % — ABNORMAL LOW (ref 34.0–46.6)
Hemoglobin: 10.1 g/dL — ABNORMAL LOW (ref 11.1–15.9)
Immature Grans (Abs): 0 10*3/uL (ref 0.0–0.1)
Immature Granulocytes: 0 %
Lymphocytes Absolute: 1.7 10*3/uL (ref 0.7–3.1)
Lymphs: 26 %
MCH: 24.2 pg — ABNORMAL LOW (ref 26.6–33.0)
MCHC: 30.1 g/dL — ABNORMAL LOW (ref 31.5–35.7)
MCV: 80 fL (ref 79–97)
Monocytes Absolute: 0.5 10*3/uL (ref 0.1–0.9)
Monocytes: 8 %
Neutrophils Absolute: 3.9 10*3/uL (ref 1.4–7.0)
Neutrophils: 61 %
Platelets: 301 10*3/uL (ref 150–450)
RBC: 4.17 x10E6/uL (ref 3.77–5.28)
RDW: 16.4 % — ABNORMAL HIGH (ref 11.7–15.4)
WBC: 6.4 10*3/uL (ref 3.4–10.8)

## 2020-09-10 LAB — COMPREHENSIVE METABOLIC PANEL
ALT: 8 IU/L (ref 0–32)
AST: 16 IU/L (ref 0–40)
Albumin/Globulin Ratio: 2.2 (ref 1.2–2.2)
Albumin: 4.7 g/dL — ABNORMAL HIGH (ref 3.6–4.6)
Alkaline Phosphatase: 67 IU/L (ref 44–121)
BUN/Creatinine Ratio: 26 (ref 12–28)
BUN: 16 mg/dL (ref 8–27)
Bilirubin Total: 0.4 mg/dL (ref 0.0–1.2)
CO2: 21 mmol/L (ref 20–29)
Calcium: 10 mg/dL (ref 8.7–10.3)
Chloride: 102 mmol/L (ref 96–106)
Creatinine, Ser: 0.61 mg/dL (ref 0.57–1.00)
Globulin, Total: 2.1 g/dL (ref 1.5–4.5)
Glucose: 91 mg/dL (ref 65–99)
Potassium: 4.6 mmol/L (ref 3.5–5.2)
Sodium: 138 mmol/L (ref 134–144)
Total Protein: 6.8 g/dL (ref 6.0–8.5)
eGFR: 90 mL/min/{1.73_m2} (ref >59)

## 2020-09-10 LAB — VITAMIN B12: Vitamin B-12: 1198 pg/mL (ref 232–1245)

## 2020-09-15 NOTE — Progress Notes (Signed)
Noted  

## 2020-09-16 ENCOUNTER — Encounter: Payer: Self-pay | Admitting: Oncology

## 2020-09-16 ENCOUNTER — Inpatient Hospital Stay: Payer: Medicare PPO | Attending: Oncology

## 2020-09-16 VITALS — BP 161/54 | HR 66 | Temp 97.3°F | Resp 16

## 2020-09-16 DIAGNOSIS — D649 Anemia, unspecified: Secondary | ICD-10-CM | POA: Insufficient documentation

## 2020-09-16 MED ORDER — SODIUM CHLORIDE 0.9 % IV SOLN: 200.0000 mg | Freq: Once | INTRAVENOUS | Status: DC

## 2020-09-16 MED ORDER — IRON SUCROSE 20 MG/ML IV SOLN
200.0000 mg | Freq: Once | INTRAVENOUS | Status: AC
  Administered 2020-09-16: 200 mg via INTRAVENOUS
  Filled 2020-09-16: qty 10

## 2020-09-16 MED ORDER — SODIUM CHLORIDE 0.9 % IV SOLN
Freq: Once | INTRAVENOUS | Status: AC
Start: 2020-09-16 — End: 2020-09-16
  Filled 2020-09-16: qty 250

## 2020-09-23 ENCOUNTER — Inpatient Hospital Stay: Payer: Medicare PPO

## 2020-09-23 ENCOUNTER — Other Ambulatory Visit: Payer: Self-pay

## 2020-09-23 VITALS — BP 160/56 | HR 68 | Resp 16

## 2020-09-23 DIAGNOSIS — D649 Anemia, unspecified: Secondary | ICD-10-CM | POA: Diagnosis not present

## 2020-09-23 MED ORDER — SODIUM CHLORIDE 0.9 % IV SOLN: 200.0000 mg | Freq: Once | INTRAVENOUS | Status: DC

## 2020-09-23 MED ORDER — SODIUM CHLORIDE 0.9 % IV SOLN
Freq: Once | INTRAVENOUS | Status: AC
  Filled 2020-09-23: qty 250

## 2020-09-23 MED ORDER — IRON SUCROSE 20 MG/ML IV SOLN
200.0000 mg | Freq: Once | INTRAVENOUS | Status: AC
Start: 2020-09-23 — End: 2020-09-23
  Administered 2020-09-23: 200 mg via INTRAVENOUS
  Filled 2020-09-23: qty 10

## 2020-09-23 NOTE — Patient Instructions (Signed)
[facility]  Discharge Instructions: Thank you for choosing [facility] to provide your oncology and hematology care.  If you have a lab appointment with the Cancer Center, please go directly to the Cancer Center and check in at the registration area.  Wear comfortable clothing and clothing appropriate for easy access to any Portacath or PICC line.   We strive to give you quality time with your provider. You may need to reschedule your appointment if you arrive late (15 or more minutes).  Arriving late affects you and other patients whose appointments are after yours.  Also, if you miss three or more appointments without notifying the office, you may be dismissed from the clinic at the provider's discretion.      For prescription refill requests, have your pharmacy contact our office and allow 72 hours for refills to be completed.    Today you received the following : Venofer   To help prevent nausea and vomiting after your treatment, we encourage you to take your nausea medication as directed.  BELOW ARE SYMPTOMS THAT SHOULD BE REPORTED IMMEDIATELY: . *FEVER GREATER THAN 100.4 F (38 C) OR HIGHER . *CHILLS OR SWEATING . *NAUSEA AND VOMITING THAT IS NOT CONTROLLED WITH YOUR NAUSEA MEDICATION . *UNUSUAL SHORTNESS OF BREATH . *UNUSUAL BRUISING OR BLEEDING . *URINARY PROBLEMS (pain or burning when urinating, or frequent urination) . *BOWEL PROBLEMS (unusual diarrhea, constipation, pain near the anus) . TENDERNESS IN MOUTH AND THROAT WITH OR WITHOUT PRESENCE OF ULCERS (sore throat, sores in mouth, or a toothache) . UNUSUAL RASH, SWELLING OR PAIN  . UNUSUAL VAGINAL DISCHARGE OR ITCHING   Items with * indicate a potential emergency and should be followed up as soon as possible or go to the Emergency Department if any problems should occur.  Please show the CHEMOTHERAPY ALERT CARD or IMMUNOTHERAPY ALERT CARD at check-in to the Emergency  Department and triage nurse.  Should you have questions after your visit or need to cancel or reschedule your appointment, please contact [facility]  [phone] and follow the prompts.  Office hours are 8:00 a.m. to 4:30 p.m. Monday - Friday. Please note that voicemails left after 4:00 p.m. may not be returned until the following business day.  We are closed weekends and major holidays. You have access to a nurse at all times for urgent questions. Please call the main number to the clinic [phone] and follow the prompts.  For any non-urgent questions, you may also contact your provider using MyChart. We now offer e-Visits for anyone 18 and older to request care online for non-urgent symptoms. For details visit [email or web address].   Also download the MyChart app! Go to the app store, search "MyChart", open the app, select [facility], and log in with your MyChart username and password.  Due to Covid, a mask is required upon entering the hospital/clinic. If you do not have a mask, one will be given to you upon arrival. For doctor visits, patients may have 1 support person aged 18 or older with them. For treatment visits, patients cannot have anyone with them due to current Covid guidelines and our immunocompromised population.  

## 2020-09-28 ENCOUNTER — Encounter: Payer: Self-pay | Admitting: Oncology

## 2020-10-04 NOTE — Progress Notes (Signed)
BP (!) 178/74   Pulse 64   Temp 98.4 F (36.9 C)   Wt 183 lb 8 oz (83.2 kg)   SpO2 98%   BMI 32.51 kg/m    Subjective:    Patient ID: [name], female    DOB: 23-Jan-1941, 80 y.o.   MRN: [id number]  HPI: [name] is a 80 y.o. female  Chief Complaint  Patient presents with   Anemia   HYPERTENSION / HYPERLIPIDEMIA Satisfied with current treatment? yes Duration of hypertension: years BP monitoring frequency: daily BP range: 170/80 BP medication side effects: no Past BP meds: carvedilol and lisinopril Duration of hyperlipidemia: years Cholesterol medication side effects: no Cholesterol supplements: none Past cholesterol medications: rosuvastatin (crestor) Medication compliance: excellent compliance Aspirin: no Recent stressors: no Recurrent headaches: no Visual changes: no Palpitations: no Dyspnea: no Chest pain: no Lower extremity edema: no Dizzy/lightheaded: no  ANEMIA Anemia status: stable Etiology of anemia: Duration of anemia treatment:  Compliance with treatment: excellent compliance Iron supplementation side effects: yes Severity of anemia: moderate Fatigue: yes Decreased exercise tolerance: no  Dyspnea on exertion: no Palpitations: no Bleeding: no Pica: no  Relevant past medical, surgical, family and social history reviewed and updated as indicated. Interim medical history since our last visit reviewed. Allergies and medications reviewed and updated.  Review of Systems  Constitutional:  Positive for fatigue.  Eyes:  Negative for visual disturbance.  Respiratory:  Negative for cough, chest tightness and shortness of breath.   Cardiovascular:  Negative for chest pain, palpitations and leg swelling.  Gastrointestinal:  Negative for blood in stool.  Neurological:  Negative for dizziness and headaches.   Per HPI unless specifically indicated above     Objective:    BP (!) 178/74   Pulse 64   Temp 98.4 F (36.9 C)    Wt 183 lb 8 oz (83.2 kg)   SpO2 98%   BMI 32.51 kg/m   Wt Readings from Last 3 Encounters:  10/05/20 183 lb 8 oz (83.2 kg)  09/09/20 190 lb (86.2 kg)  08/31/20 190 lb (86.2 kg)    Physical Exam Vitals and nursing note reviewed.  Constitutional:      General: She is not in acute distress.    Appearance: Normal appearance. She is normal weight. She is not ill-appearing, toxic-appearing or diaphoretic.  HENT:     Head: Normocephalic.     Right Ear: External ear normal.     Left Ear: External ear normal.     Nose: Nose normal.     Mouth/Throat:     Mouth: Mucous membranes are moist.     Pharynx: Oropharynx is clear.  Eyes:     General:        Right eye: No discharge.        Left eye: No discharge.     Extraocular Movements: Extraocular movements intact.     Conjunctiva/sclera: Conjunctivae normal.     Pupils: Pupils are equal, round, and reactive to light.  Cardiovascular:     Rate and Rhythm: Normal rate and regular rhythm.     Heart sounds: No murmur heard. Pulmonary:     Effort: Pulmonary effort is normal. No respiratory distress.     Breath sounds: Normal breath sounds. No wheezing or rales.  Musculoskeletal:     Cervical back: Normal range of motion and neck supple.  Skin:    General: Skin is warm and dry.     Capillary Refill: Capillary refill takes less than  2 seconds.  Neurological:     General: No focal deficit present.     Mental Status: She is alert and oriented to person, place, and time. Mental status is at baseline.  Psychiatric:        Mood and Affect: Mood normal.        Behavior: Behavior normal.        Thought Content: Thought content normal.        Judgment: Judgment normal.    Results for orders placed or performed in visit on 09/09/20  Comp Met (CMET)  Result Value Ref Range   Glucose 91 65 - 99 mg/dL   BUN 16 8 - 27 mg/dL   Creatinine, Ser 0.61 0.57 - 1.00 mg/dL   eGFR 90 >59 mL/min/1.73   BUN/Creatinine Ratio 26 12 - 28   Sodium 138 134 -  144 mmol/L   Potassium 4.6 3.5 - 5.2 mmol/L   Chloride 102 96 - 106 mmol/L   CO2 21 20 - 29 mmol/L   Calcium 10.0 8.7 - 10.3 mg/dL   Total Protein 6.8 6.0 - 8.5 g/dL   Albumin 4.7 (H) 3.6 - 4.6 g/dL   Globulin, Total 2.1 1.5 - 4.5 g/dL   Albumin/Globulin Ratio 2.2 1.2 - 2.2   Bilirubin Total 0.4 0.0 - 1.2 mg/dL   Alkaline Phosphatase 67 44 - 121 IU/L   AST 16 0 - 40 IU/L   ALT 8 0 - 32 IU/L  Vitamin B12  Result Value Ref Range   Vitamin B-12 1,198 232 - 1,245 pg/mL  CBC with Differential  Result Value Ref Range   WBC 6.4 3.4 - 10.8 x10E3/uL   RBC 4.17 3.77 - 5.28 x10E6/uL   Hemoglobin 10.1 (L) 11.1 - 15.9 g/dL   Hematocrit 33.5 (L) 34.0 - 46.6 %   MCV 80 79 - 97 fL   MCH 24.2 (L) 26.6 - 33.0 pg   MCHC 30.1 (L) 31.5 - 35.7 g/dL   RDW 16.4 (H) 11.7 - 15.4 %   Platelets 301 150 - 450 x10E3/uL   Neutrophils 61 Not Estab. %   Lymphs 26 Not Estab. %   Monocytes 8 Not Estab. %   Eos 4 Not Estab. %   Basos 1 Not Estab. %   Neutrophils Absolute 3.9 1.4 - 7.0 x10E3/uL   Lymphocytes Absolute 1.7 0.7 - 3.1 x10E3/uL   Monocytes Absolute 0.5 0.1 - 0.9 x10E3/uL   EOS (ABSOLUTE) 0.3 0.0 - 0.4 x10E3/uL   Basophils Absolute 0.0 0.0 - 0.2 x10E3/uL   Immature Granulocytes 0 Not Estab. %   Immature Grans (Abs) 0.0 0.0 - 0.1 x10E3/uL      Assessment & Plan:   Problem List Items Addressed This Visit       Cardiovascular and Mediastinum   Hypertension - Primary    Chronic.  Elevated at visit today. Will increase lisinopril to 57m daily.  Continue to check blood pressures at home.  Follow up in 2 months for reevaluation.       Relevant Medications   carvedilol (COREG) 6.25 MG tablet   lisinopril (ZESTRIL) 40 MG tablet   rosuvastatin (CRESTOR) 10 MG tablet     Other   Anemia    Chronic.  Controlled.  Continue to follow up with hematology.  Will have labs drawn by hematology next week.  Return to clinic in 3 months for reevaluation.  Call sooner if concerns arise.          Vitamin D deficiency  Pure hypercholesterolemia    Chronic. Increased Crestor to 41m. Will check blood work at next visit. Follow up in 2 months.       Relevant Medications   carvedilol (COREG) 6.25 MG tablet   lisinopril (ZESTRIL) 40 MG tablet   rosuvastatin (CRESTOR) 10 MG tablet     Follow up plan: Return in about 2 months (around 12/06/2020) for Physical and Fasting labs.

## 2020-10-05 ENCOUNTER — Encounter: Payer: Self-pay | Admitting: Nurse Practitioner

## 2020-10-05 ENCOUNTER — Other Ambulatory Visit: Payer: Self-pay

## 2020-10-05 ENCOUNTER — Ambulatory Visit: Payer: Medicare PPO | Admitting: Nurse Practitioner

## 2020-10-05 VITALS — BP 178/74 | HR 64 | Temp 98.4°F | Wt 183.5 lb

## 2020-10-05 DIAGNOSIS — I10 Essential (primary) hypertension: Secondary | ICD-10-CM | POA: Diagnosis not present

## 2020-10-05 DIAGNOSIS — E559 Vitamin D deficiency, unspecified: Secondary | ICD-10-CM

## 2020-10-05 DIAGNOSIS — E7800 Pure hypercholesterolemia, unspecified: Secondary | ICD-10-CM | POA: Diagnosis not present

## 2020-10-05 DIAGNOSIS — D649 Anemia, unspecified: Secondary | ICD-10-CM | POA: Diagnosis not present

## 2020-10-05 MED ORDER — ROSUVASTATIN CALCIUM 10 MG PO TABS: 10.0000 mg | ORAL_TABLET | Freq: Every day | ORAL | 1 refills | Status: DC

## 2020-10-05 MED ORDER — LISINOPRIL 40 MG PO TABS: 40.0000 mg | ORAL_TABLET | Freq: Every day | ORAL | 1 refills | Status: DC

## 2020-10-05 NOTE — Assessment & Plan Note (Signed)
Chronic. Increased Crestor to '20mg'$ . Will check blood work at next visit. Follow up in 2 months.

## 2020-10-05 NOTE — Assessment & Plan Note (Addendum)
Chronic.  Controlled.  Continue to follow up with hematology.  Will have labs drawn by hematology next week.  Return to clinic in 3 months for reevaluation.  Call sooner if concerns arise.

## 2020-10-05 NOTE — Assessment & Plan Note (Signed)
Chronic.  Elevated at visit today. Will increase lisinopril to '40mg'$  daily.  Continue to check blood pressures at home.  Follow up in 2 months for reevaluation.

## 2020-10-06 DIAGNOSIS — M25562 Pain in left knee: Secondary | ICD-10-CM | POA: Diagnosis not present

## 2020-10-06 DIAGNOSIS — M1712 Unilateral primary osteoarthritis, left knee: Secondary | ICD-10-CM | POA: Diagnosis not present

## 2020-10-06 DIAGNOSIS — M21162 Varus deformity, not elsewhere classified, left knee: Secondary | ICD-10-CM | POA: Diagnosis not present

## 2020-10-11 ENCOUNTER — Other Ambulatory Visit: Payer: Self-pay | Admitting: *Deleted

## 2020-10-11 DIAGNOSIS — D649 Anemia, unspecified: Secondary | ICD-10-CM

## 2020-10-12 ENCOUNTER — Inpatient Hospital Stay: Payer: Medicare PPO | Attending: Oncology

## 2020-10-12 ENCOUNTER — Inpatient Hospital Stay: Payer: Medicare PPO

## 2020-10-12 ENCOUNTER — Inpatient Hospital Stay: Payer: Medicare PPO | Admitting: Oncology

## 2020-10-12 ENCOUNTER — Other Ambulatory Visit: Payer: Self-pay | Admitting: Nurse Practitioner

## 2020-10-12 ENCOUNTER — Other Ambulatory Visit: Payer: Self-pay

## 2020-10-12 VITALS — BP 146/62 | HR 79 | Temp 98.9°F | Resp 18 | Wt 185.0 lb

## 2020-10-12 DIAGNOSIS — D509 Iron deficiency anemia, unspecified: Secondary | ICD-10-CM | POA: Insufficient documentation

## 2020-10-12 DIAGNOSIS — D649 Anemia, unspecified: Secondary | ICD-10-CM

## 2020-10-12 LAB — CBC WITH DIFFERENTIAL/PLATELET
Abs Immature Granulocytes: 0.01 10*3/uL (ref 0.00–0.07)
Basophils Absolute: 0 10*3/uL (ref 0.0–0.1)
Basophils Relative: 0 %
Eosinophils Absolute: 0.1 10*3/uL (ref 0.0–0.5)
Eosinophils Relative: 2 %
HCT: 35.4 % — ABNORMAL LOW (ref 36.0–46.0)
Hemoglobin: 11.3 g/dL — ABNORMAL LOW (ref 12.0–15.0)
Immature Granulocytes: 0 %
Lymphocytes Relative: 26 %
Lymphs Abs: 1.3 10*3/uL (ref 0.7–4.0)
MCH: 27 pg (ref 26.0–34.0)
MCHC: 31.9 g/dL (ref 30.0–36.0)
MCV: 84.5 fL (ref 80.0–100.0)
Monocytes Absolute: 0.4 10*3/uL (ref 0.1–1.0)
Monocytes Relative: 8 %
Neutro Abs: 3.3 10*3/uL (ref 1.7–7.7)
Neutrophils Relative %: 64 %
Platelets: 238 10*3/uL (ref 150–400)
RBC: 4.19 MIL/uL (ref 3.87–5.11)
RDW: 20.2 % — ABNORMAL HIGH (ref 11.5–15.5)
WBC: 5.1 10*3/uL (ref 4.0–10.5)
nRBC: 0 % (ref 0.0–0.2)

## 2020-10-12 LAB — FERRITIN: Ferritin: 85 ng/mL (ref 11–307)

## 2020-10-12 LAB — SAMPLE TO BLOOD BANK

## 2020-10-12 MED ORDER — SODIUM CHLORIDE 0.9 % IV SOLN: 200.0000 mg | Freq: Once | INTRAVENOUS | Status: DC

## 2020-10-12 MED ORDER — SODIUM CHLORIDE 0.9 % IV SOLN
Freq: Once | INTRAVENOUS | Status: AC
  Filled 2020-10-12: qty 250

## 2020-10-12 MED ORDER — IRON SUCROSE 20 MG/ML IV SOLN
200.0000 mg | Freq: Once | INTRAVENOUS | Status: AC
  Administered 2020-10-12: 200 mg via INTRAVENOUS
  Filled 2020-10-12: qty 10

## 2020-10-12 MED ORDER — TRIAMCINOLONE ACETONIDE 0.1 % EX CREA: 1.0000 "application " | TOPICAL_CREAM | Freq: Two times a day (BID) | CUTANEOUS | 0 refills | Status: DC

## 2020-10-12 NOTE — Progress Notes (Signed)
[name]  [phone]) [phone] [phone]) [phone]  ID: Dlisa Barnwell OB: 1941-02-08  MR#: [id number]  QQP#:[id number]  Patient Care Team: Jon Billings, NP as PCP - General (Nurse Practitioner) Kate Sable, MD as PCP - Cardiology (Cardiology)  CHIEF COMPLAINT: Anemia  History of present illness - Patient is a 80 year old female who presents to hematology for iron deficiency anemia.  She was recently evaluated by her PCP Barbee Cough, NP and found to have a hemoglobin of 7.9, iron saturation 5% and ferritin of 47.  Upon chart review, about a month  her hemoglobin was 10.2.  B12 level was elevated and folate level was normal back in April 2022.  Patient had right total knee arthroplasty at the end of April 2022 with Dr. Harlow Mares.   She was previously on iron supplements but currently discontinued secondary to stomach issues.  She has a past medical history significant for hypertension, osteoporosis, osteoarthritis of both knees, essential tremor, postmenopausal bleeding, high cholesterol, skin cancer who has had iron deficiency for many years now.  She last had a colonoscopy back in May 2009 which showed an adenomatous polyp.   She has a recent  history of right knee replacement and recently had some swelling prompting an ultrasound of her right leg.  Results were negative for a blood clot. Swelling has improved.   Required cardiac clearance prior to knee replacement.  Lexiscan Myoview on 05/27/2020 which showed no evidence of ischemia.  She received 1 unit PRBC and 3 IV iron infusions (09/09/20-09/23/20).   Interval history- Reports feeling much improved since her iron infusions/blood transfusion.  Reports allover joint pain but this is stable.  Denies any bleeding.  REVIEW OF SYSTEMS:   Review of Systems  Constitutional:  Positive for malaise/fatigue. Negative for chills, fever and weight loss.  HENT:  Negative for congestion, ear pain and  tinnitus.   Eyes: Negative.  Negative for blurred vision and double vision.  Respiratory: Negative.  Negative for cough, sputum production and shortness of breath.   Cardiovascular: Negative.  Negative for chest pain, palpitations and leg swelling.  Gastrointestinal: Negative.  Negative for abdominal pain, constipation, diarrhea, nausea and vomiting.  Genitourinary:  Negative for dysuria, frequency and urgency.  Musculoskeletal:  Positive for joint pain. Negative for back pain and falls.  Skin: Negative.  Negative for rash.  Neurological:  Negative for weakness and headaches.  Endo/Heme/Allergies: Negative.  Does not bruise/bleed easily.  Psychiatric/Behavioral: Negative.  Negative for depression. The patient is not nervous/anxious and does not have insomnia.    As per HPI. Otherwise, a complete review of systems is negative.  PAST MEDICAL HISTORY: Past Medical History:  Diagnosis Date   Anemia    Colon polyp    History of small bowel obstruction 9/09   likely due to adhesions- pt refused most dx and tx in hospital   History of vaginal bleeding    post menopausal- gyn eval, ? polyp   Hyperlipidemia    Osteoarthritis    knee   Schizoaffective disorder    paranoid personality- refuses treatment   Vitamin D deficiency     PAST SURGICAL HISTORY: Past Surgical History:  Procedure Laterality Date   ANTERIOR AND POSTERIOR REPAIR N/A 04/23/2012   Procedure: ANTERIOR   REPAIR CYSTOCELE;  Surgeon: Reece Packer, MD;  Location: [facility];  Service: Urology;  Laterality: N/A;  cysto;graft 10x6   APPENDECTOMY     bladder tack  1976   KNEE ARTHROSCOPY     right  KNEE ARTHROSCOPY Right    LAPAROSCOPY  1970's   twisted fallopian tube   SALPINGOOPHORECTOMY Bilateral 04/23/2012   Procedure: SALPINGO OOPHORECTOMY;  Surgeon: Emily Filbert, MD;  Location: [facility];  Service: Gynecology;  Laterality: Bilateral;   TONSILLECTOMY     VAGINAL HYSTERECTOMY N/A 04/23/2012   Procedure: HYSTERECTOMY  VAGINAL;  Surgeon: Emily Filbert, MD;  Location: [facility];  Service: Gynecology;  Laterality: N/A;   VAGINAL PROLAPSE REPAIR N/A 04/23/2012   Procedure: VAGINAL VAULT SUSPENSION;  Surgeon: Reece Packer, MD;  Location: [facility];  Service: Urology;  Laterality: N/A;    FAMILY HISTORY: Family History  Problem Relation Age of Onset   Heart disease Mother    Diabetes Mother    Skin cancer Mother    Diabetes Father    Heart disease Father    Hypertension Father    Diabetes Brother    Arthritis Brother    Hypertension Brother    Hyperlipidemia Brother    Arthritis Daughter    Cancer Son    Obesity Daughter     ADVANCED DIRECTIVES (Y/N):  N  HEALTH MAINTENANCE: Social History   Tobacco Use   Smoking status: Former    Packs/day: 0.50    Years: 20.00    Pack years: 10.00    Types: Cigarettes    Quit date: 03/13/1982    Years since quitting: 38.6   Smokeless tobacco: Never  Vaping Use   Vaping Use: Never used  Substance Use Topics   Alcohol use: No   Drug use: No     Colonoscopy:  PAP:  Bone density:  Lipid panel:  Allergies  Allergen Reactions   Penicillins Rash    Current Outpatient Medications  Medication Sig Dispense Refill   carvedilol (COREG) 6.25 MG tablet Take 6.25 mg by mouth 2 (two) times daily with a meal.     gabapentin (NEURONTIN) 100 MG capsule Take 1 capsule (100 mg total) by mouth at bedtime. Take 1 capsule at bedtime. If no relief, can go up to 2 tablets at bedtime after 3 days. 90 capsule 0   lisinopril (ZESTRIL) 40 MG tablet Take 1 tablet (40 mg total) by mouth daily. 90 tablet 1   rosuvastatin (CRESTOR) 10 MG tablet Take 1 tablet (10 mg total) by mouth daily. 90 tablet 1   tiZANidine (ZANAFLEX) 4 MG tablet Take 4 mg by mouth at bedtime.     traMADol (ULTRAM) 50 MG tablet Take 50 mg by mouth every 8 (eight) hours as needed.     triamcinolone cream (KENALOG) 0.1 % Apply 1 application topically 2 (two) times daily. 30 g 0   No current  facility-administered medications for this visit.    OBJECTIVE: Vitals:   10/12/20 1323  BP: (!) 146/62  Pulse: 79  Resp: 18  Temp: 98.9 F (37.2 C)  SpO2: 97%     Body mass index is 32.77 kg/m.    ECOG FS:2 - Symptomatic, <50% confined to bed  Physical Exam Constitutional:      Appearance: Normal appearance.  HENT:     Head: Normocephalic and atraumatic.  Eyes:     Pupils: Pupils are equal, round, and reactive to light.  Cardiovascular:     Rate and Rhythm: Normal rate and regular rhythm.     Heart sounds: Normal heart sounds. No murmur heard. Pulmonary:     Effort: Pulmonary effort is normal.     Breath sounds: Normal breath sounds. No wheezing.  Abdominal:  General: Bowel sounds are normal. There is no distension.     Palpations: Abdomen is soft.     Tenderness: There is no abdominal tenderness.  Musculoskeletal:        General: Normal range of motion.     Cervical back: Normal range of motion.  Skin:    General: Skin is warm and dry.     Findings: No rash.  Neurological:     Mental Status: She is alert and oriented to person, place, and time.  Psychiatric:        Judgment: Judgment normal.     LAB RESULTS:  Lab Results  Component Value Date   NA 138 09/09/2020   K 4.6 09/09/2020   CL 102 09/09/2020   CO2 21 09/09/2020   GLUCOSE 91 09/09/2020   BUN 16 09/09/2020   CREATININE 0.61 09/09/2020   CALCIUM 10.0 09/09/2020   PROT 6.8 09/09/2020   ALBUMIN 4.7 (H) 09/09/2020   AST 16 09/09/2020   ALT 8 09/09/2020   ALKPHOS 67 09/09/2020   BILITOT 0.4 09/09/2020   GFRNONAA >60 08/17/2020   GFRAA  06/29/2008    >60        The eGFR has been calculated using the MDRD equation. This calculation has not been validated in all clinical situations. eGFR's persistently <60 mL/min signify possible Chronic Kidney Disease.    Lab Results  Component Value Date   WBC 5.1 10/12/2020   NEUTROABS 3.3 10/12/2020   HGB 11.3 (L) 10/12/2020   HCT 35.4 (L)  10/12/2020   MCV 84.5 10/12/2020   PLT 238 10/12/2020     STUDIES: No results found.   ASSESSMENT: Patient returns for follow-up for her iron deficiency anemia status post 1 unit PRBC and three iron infusions.  Symptomatic anemia: This is likely multifactorial secondary to iron deficiency and recent knee replacement with blood loss.  Unable to tolerate oral iron due to GI upset.  She had a total knee back in April 2022.  She was extremely symptomatic with severe fatigue and hypotension.  She received 1 unit of packed red blood cells and 3 doses of IV Venofer (09/09/2020-09/23/2020).  Anemia work-up showed hemoglobin 7.9, iron saturation 5% total iron 17 and ferritin 47 (acute phase reactant likely elevated due to recent surgery).  Vitamin B12, folate and copper were all normal.  Has noted much improvement since her infusions.  Labs from today show hemoglobin 11.3.  Patient still mildly symptomatic when additional IV Venofer today.  She will return to clinic in approximately 3 months for repeat lab work, NP assessment and possible IV Venofer.  Right total knee replacement surgery: This was completed at the end of April 2022.  She was in rehab for several weeks following.  She is returned home and is doing well.  Has weekly physical therapy.  Arthralgia- Significant swelling in bilateral knees.  Follows with [facility].  Disposition: RTC in 3 months with repeat lab work, NP assessment and possible IV Venofer.  I spent 20 minutes dedicated to the care of this patient (face-to-face and non-face-to-face) on the date of the encounter to include what is described in the assessment and plan.  Patient expressed understanding and was in agreement with this plan. She also understands that She can call clinic at any time with any questions, concerns, or complaints.   Cancer Staging No matching staging information was found for the patient.  Jacquelin Hawking, NP   10/12/2020 2:09 PM

## 2020-10-12 NOTE — Patient Instructions (Signed)
[facility] [place]  Discharge Instructions: Thank you for choosing [facility] to provide your oncology and hematology care.  If you have a lab appointment with the Cancer Center, please go directly to the Cancer Center and check in at the registration area.  Wear comfortable clothing and clothing appropriate for easy access to any Portacath or PICC line.   We strive to give you quality time with your provider. You may need to reschedule your appointment if you arrive late (15 or more minutes).  Arriving late affects you and other patients whose appointments are after yours.  Also, if you miss three or more appointments without notifying the office, you may be dismissed from the clinic at the provider's discretion.      For prescription refill requests, have your pharmacy contact our office and allow 72 hours for refills to be completed.    Today you received the following chemotherapy and/or immunotherapy agents venofer       To help prevent nausea and vomiting after your treatment, we encourage you to take your nausea medication as directed.  BELOW ARE SYMPTOMS THAT SHOULD BE REPORTED IMMEDIATELY: *FEVER GREATER THAN 100.4 F (38 C) OR HIGHER *CHILLS OR SWEATING *NAUSEA AND VOMITING THAT IS NOT CONTROLLED WITH YOUR NAUSEA MEDICATION *UNUSUAL SHORTNESS OF BREATH *UNUSUAL BRUISING OR BLEEDING *URINARY PROBLEMS (pain or burning when urinating, or frequent urination) *BOWEL PROBLEMS (unusual diarrhea, constipation, pain near the anus) TENDERNESS IN MOUTH AND THROAT WITH OR WITHOUT PRESENCE OF ULCERS (sore throat, sores in mouth, or a toothache) UNUSUAL RASH, SWELLING OR PAIN  UNUSUAL VAGINAL DISCHARGE OR ITCHING   Items with * indicate a potential emergency and should be followed up as soon as possible or go to the Emergency Department if any problems should occur.  Please show the CHEMOTHERAPY ALERT CARD or IMMUNOTHERAPY ALERT CARD at check-in  to the Emergency Department and triage nurse.  Should you have questions after your visit or need to cancel or reschedule your appointment, please contact [facility]  [phone] and follow the prompts.  Office hours are 8:00 a.m. to 4:30 p.m. Monday - Friday. Please note that voicemails left after 4:00 p.m. may not be returned until the following business day.  We are closed weekends and major holidays. You have access to a nurse at all times for urgent questions. Please call the main number to the clinic [phone] and follow the prompts.  For any non-urgent questions, you may also contact your provider using MyChart. We now offer e-Visits for anyone 18 and older to request care online for non-urgent symptoms. For details visit [email or web address].   Also download the MyChart app! Go to the app store, search "MyChart", open the app, select , and log in with your MyChart username and password.  Due to Covid, a mask is required upon entering the hospital/clinic. If you do not have a mask, one will be given to you upon arrival. For doctor visits, patients may have 1 support person aged 18 or older with them. For treatment visits, patients cannot have anyone with them due to current Covid guidelines and our immunocompromised population.   Iron Sucrose injection What is this medication? IRON SUCROSE (AHY ern SOO krohs) is an iron complex. Iron is used to make healthy red blood cells, which carry oxygen and nutrients throughout the body. This medicine is used to treat iron deficiency anemia in people with chronickidney disease. This medicine may be used for   other purposes; ask your health care provider orpharmacist if you have questions. COMMON BRAND NAME(S): Venofer What should I tell my care team before I take this medication? They need to know if you have any of these conditions: anemia not caused by low iron levels heart disease high levels of  iron in the blood kidney disease liver disease an unusual or allergic reaction to iron, other medicines, foods, dyes, or preservatives pregnant or trying to get pregnant breast-feeding How should I use this medication? This medicine is for infusion into a vein. It is given by a health careprofessional in a hospital or clinic setting. Talk to your pediatrician regarding the use of this medicine in children. While this drug may be prescribed for children as young as 2 years for selectedconditions, precautions do apply. Overdosage: If you think you have taken too much of this medicine contact apoison control center or emergency room at once. NOTE: This medicine is only for you. Do not share this medicine with others. What if I miss a dose? It is important not to miss your dose. Call your doctor or health careprofessional if you are unable to keep an appointment. What may interact with this medication? Do not take this medicine with any of the following medications: deferoxamine dimercaprol other iron products This medicine may also interact with the following medications: chloramphenicol deferasirox This list may not describe all possible interactions. Give your health care provider a list of all the medicines, herbs, non-prescription drugs, or dietary supplements you use. Also tell them if you smoke, drink alcohol, or use illegaldrugs. Some items may interact with your medicine. What should I watch for while using this medication? Visit your doctor or healthcare professional regularly. Tell your doctor or healthcare professional if your symptoms do not start to get better or if theyget worse. You may need blood work done while you are taking this medicine. You may need to follow a special diet. Talk to your doctor. Foods that contain iron include: whole grains/cereals, dried fruits, beans, or peas, leafy greenvegetables, and organ meats (liver, kidney). What side effects may I notice from  receiving this medication? Side effects that you should report to your doctor or health care professionalas soon as possible: allergic reactions like skin rash, itching or hives, swelling of the face, lips, or tongue breathing problems changes in blood pressure cough fast, irregular heartbeat feeling faint or lightheaded, falls fever or chills flushing, sweating, or hot feelings joint or muscle aches/pains seizures swelling of the ankles or feet unusually weak or tired Side effects that usually do not require medical attention (report to yourdoctor or health care professional if they continue or are bothersome): diarrhea feeling achy headache irritation at site where injected nausea, vomiting stomach upset tiredness This list may not describe all possible side effects. Call your doctor for medical advice about side effects. You may report side effects to FDA at1-800-FDA-1088. Where should I keep my medication? This drug is given in a hospital or clinic and will not be stored at home. NOTE: This sheet is a summary. It may not cover all possible information. If you have questions about this medicine, talk to your doctor, pharmacist, orhealth care provider.  2022 Elsevier/Gold Standard (2010-12-08 17:14:35)  

## 2020-10-12 NOTE — Telephone Encounter (Signed)
Copied from [name] [phone]. Topic: Quick Communication - Rx Refill/Question >> Oct 12, 2020 10:20 AM Tessa Lerner A wrote: Medication: triamcinolone cream (KENALOG) 0.1 %   Has the patient contacted their pharmacy? No. (Agent: If no, request that the patient contact the pharmacy for the refill.) (Agent: If yes, when and what did the pharmacy advise?)  Preferred Pharmacy (with phone number or street name): [facility] [place]  Phone:  [phone] Fax:  [phone]   Agent: Please be advised that RX refills may take up to 3 business days. We ask that you follow-up with your pharmacy.

## 2020-10-13 DIAGNOSIS — M1712 Unilateral primary osteoarthritis, left knee: Secondary | ICD-10-CM | POA: Diagnosis not present

## 2020-10-13 DIAGNOSIS — M25562 Pain in left knee: Secondary | ICD-10-CM | POA: Diagnosis not present

## 2020-10-18 DIAGNOSIS — Z96651 Presence of right artificial knee joint: Secondary | ICD-10-CM | POA: Diagnosis not present

## 2020-10-18 DIAGNOSIS — M1712 Unilateral primary osteoarthritis, left knee: Secondary | ICD-10-CM | POA: Diagnosis not present

## 2020-10-20 DIAGNOSIS — M1712 Unilateral primary osteoarthritis, left knee: Secondary | ICD-10-CM | POA: Diagnosis not present

## 2020-10-20 DIAGNOSIS — M25562 Pain in left knee: Secondary | ICD-10-CM | POA: Diagnosis not present

## 2020-10-20 DIAGNOSIS — M21162 Varus deformity, not elsewhere classified, left knee: Secondary | ICD-10-CM | POA: Diagnosis not present

## 2020-10-27 DIAGNOSIS — M25562 Pain in left knee: Secondary | ICD-10-CM | POA: Diagnosis not present

## 2020-10-27 DIAGNOSIS — M1712 Unilateral primary osteoarthritis, left knee: Secondary | ICD-10-CM | POA: Diagnosis not present

## 2020-11-04 DIAGNOSIS — M1712 Unilateral primary osteoarthritis, left knee: Secondary | ICD-10-CM | POA: Diagnosis not present

## 2020-11-04 DIAGNOSIS — M25561 Pain in right knee: Secondary | ICD-10-CM | POA: Diagnosis not present

## 2020-11-04 DIAGNOSIS — M25562 Pain in left knee: Secondary | ICD-10-CM | POA: Diagnosis not present

## 2020-11-08 ENCOUNTER — Ambulatory Visit: Payer: Self-pay | Admitting: *Deleted

## 2020-11-08 NOTE — Telephone Encounter (Signed)
Thank you for the update!

## 2020-11-08 NOTE — Telephone Encounter (Signed)
Pt calling in.   I've been in bed all day.   I'm having numbness in my left hand and a little in the right hand.   It's hard for me to walk too.    I had an appt with [facility] coming up.   I had blood in my BM last week.  I don't know if I've had any blood this week.  I just feel awful.    No diarrhea (She is speaking with a halted speech)  She is skipping about to different subjects in her conversation.    I don't know if I have fever or not.   My legs are hurting due to surgery knee replacement 4 months ago.  "I have 5 steps to get into my house".   "I throw my groceries up the steps".   "I use a walker".  "I know this getting old is rough".    "I'll see how I do the rest of the day".   "I ate a peach a while ago".   "When I asked if she has eaten anything else today "I haven't thought about it".     "My brother is in the hospital in [place] he had a stroke".   "That bothers me".   "I talked with the nurse".   "He is not a phone person".    I was getting iron infusions and red blood cells.  I'm finished with all of that.   I was anemic for a long time.     "The numbness in my hand is new".  "It started earlier this week".   "It comes and goes".   "Right now it feels funny".    I let her know I was concerned about her and that I felt she needed to go to the hospital.   She was agreeable to me calling 911 for her.   I confirmed her address and phone number.  I also asked about calling her daughter that is listed as an emergency contact.  Charlaine Dalton.   "Oh she lives in [place]".   When I pressed for more information because earlier she told me she had a daughter living in [place].   I asked if Caryl Pina lives in [place] she said,  "Yes".    "I'll call her".   "I need to get myself ready before the ambulance gets here".   "I'm in my robe".    I let her know that was fine.I   I called 911 and gave them her address and answered their triage questions.   They are sending the ambulance to her  house now.  I called the number in her chart for Charlaine Dalton [phone] and got a hold of Romelle Starcher, the pt's daughter.  I let her know who I was a nurse with [facility] and my name.   I let her know what was going on and that I had called 911 with her mother's permission (I had Romelle Starcher tell me her mother's name for verification) and they were on the way to her mother's house now.   They would probably take her to [facility] [place].  Pt lives in [place] is going to contact her sister Caryl Pina and let her know what is going on also.    I sent all this information to [facility].

## 2020-11-09 ENCOUNTER — Other Ambulatory Visit: Payer: Self-pay

## 2020-11-09 ENCOUNTER — Ambulatory Visit: Payer: Medicare PPO | Admitting: Nurse Practitioner

## 2020-11-09 ENCOUNTER — Encounter: Payer: Self-pay | Admitting: Nurse Practitioner

## 2020-11-09 VITALS — BP 170/70 | HR 85 | Temp 97.5°F | Ht 63.5 in | Wt 182.4 lb

## 2020-11-09 DIAGNOSIS — I10 Essential (primary) hypertension: Secondary | ICD-10-CM | POA: Diagnosis not present

## 2020-11-09 MED ORDER — AMLODIPINE BESYLATE 5 MG PO TABS: 5.0000 mg | ORAL_TABLET | Freq: Every day | ORAL | 0 refills | Status: DC

## 2020-11-09 NOTE — Assessment & Plan Note (Signed)
BP still not controlled, 170/70 today. Will add amlodipine '5mg'$  for her to take a bedtime. Discussed low salt diet. Continue lisinopril and carvedilol. Will check BMP today. May need referral to hypertension clinic and/or renal ultrasound. Follow up in 4 weeks.

## 2020-11-09 NOTE — Progress Notes (Signed)
Established Patient Office Visit  Subjective:  Patient ID: [name], female    DOB: 07-May-1940  Age: 80 y.o. MRN: [id number]  CC:  Chief Complaint  Patient presents with   Hypertension    Patient is here for a follow up on elevated blood pressure. Patient refused to go to the ER. Patient denies having any problems or concerns at today's visit.      HPI [name] presents for hypertension. She says that her blood pressure has been up in the 150s-180s the last few weeks, even after increasing her lisinopril 1 month ago. EMS was called yesterday due to high blood pressure and tingling in her hands. The tingling has resolved. She didn't want to go to the ER.   HYPERTENSION  Hypertension status: uncontrolled  Satisfied with current treatment? no Duration of hypertension: chronic BP monitoring frequency:  daily BP range: 180s/70s BP medication side effects:  no Medication compliance: excellent compliance Previous BP meds:carvedilol and lisinopril Aspirin: no Recurrent headaches: no Visual changes: no Palpitations: no Dyspnea: no Chest pain: no Lower extremity edema: no Dizzy/lightheaded: no   Past Medical History:  Diagnosis Date   Anemia    Colon polyp    History of small bowel obstruction 9/09   likely due to adhesions- pt refused most dx and tx in hospital   History of vaginal bleeding    post menopausal- gyn eval, ? polyp   Hyperlipidemia    Osteoarthritis    knee   Schizoaffective disorder    paranoid personality- refuses treatment   Vitamin D deficiency     Past Surgical History:  Procedure Laterality Date   ANTERIOR AND POSTERIOR REPAIR N/A 04/23/2012   Procedure: ANTERIOR   REPAIR CYSTOCELE;  Surgeon: Reece Packer, MD;  Location: [facility];  Service: Urology;  Laterality: N/A;  cysto;graft 10x6   APPENDECTOMY     bladder tack  1976   KNEE ARTHROSCOPY     right   KNEE ARTHROSCOPY Right    LAPAROSCOPY  1970's   twisted  fallopian tube   SALPINGOOPHORECTOMY Bilateral 04/23/2012   Procedure: SALPINGO OOPHORECTOMY;  Surgeon: Emily Filbert, MD;  Location: [facility];  Service: Gynecology;  Laterality: Bilateral;   TONSILLECTOMY     VAGINAL HYSTERECTOMY N/A 04/23/2012   Procedure: HYSTERECTOMY VAGINAL;  Surgeon: Emily Filbert, MD;  Location: [facility];  Service: Gynecology;  Laterality: N/A;   VAGINAL PROLAPSE REPAIR N/A 04/23/2012   Procedure: VAGINAL VAULT SUSPENSION;  Surgeon: Reece Packer, MD;  Location: [facility];  Service: Urology;  Laterality: N/A;    Family History  Problem Relation Age of Onset   Heart disease Mother    Diabetes Mother    Skin cancer Mother    Diabetes Father    Heart disease Father    Hypertension Father    Diabetes Brother    Arthritis Brother    Hypertension Brother    Hyperlipidemia Brother    Arthritis Daughter    Cancer Son    Obesity Daughter     Social History   Socioeconomic History   Marital status: Widowed    Spouse name: Not on file   Number of children: 3   Years of education: Not on file   Highest education level: Not on file  Occupational History   Occupation: retired Pharmacist, hospital  Tobacco Use   Smoking status: Former    Packs/day: 0.50    Years: 20.00    Pack years: 10.00  Types: Cigarettes    Quit date: 03/13/1982    Years since quitting: 38.6   Smokeless tobacco: Never  Vaping Use   Vaping Use: Never used  Substance and Sexual Activity   Alcohol use: No   Drug use: No   Sexual activity: Yes    Birth control/protection: Post-menopausal  Other Topics Concern   Not on file  Social History Narrative   Lives alone   Social Determinants of Health   Financial Resource Strain: Not on file  Food Insecurity: Not on file  Transportation Needs: Not on file  Physical Activity: Not on file  Stress: Not on file  Social Connections: Not on file  Intimate Partner Violence: Not on file    Outpatient Medications Prior to Visit  Medication Sig Dispense Refill    lisinopril (ZESTRIL) 40 MG tablet Take 1 tablet (40 mg total) by mouth daily. 90 tablet 1   rosuvastatin (CRESTOR) 10 MG tablet Take 1 tablet (10 mg total) by mouth daily. 90 tablet 1   tiZANidine (ZANAFLEX) 4 MG tablet Take 4 mg by mouth at bedtime.     traMADol (ULTRAM) 50 MG tablet Take 50 mg by mouth every 8 (eight) hours as needed.     triamcinolone cream (KENALOG) 0.1 % Apply 1 application topically 2 (two) times daily. 30 g 0   carvedilol (COREG) 6.25 MG tablet Take 6.25 mg by mouth 2 (two) times daily with a meal.     gabapentin (NEURONTIN) 100 MG capsule Take 1 capsule (100 mg total) by mouth at bedtime. Take 1 capsule at bedtime. If no relief, can go up to 2 tablets at bedtime after 3 days. 90 capsule 0   No facility-administered medications prior to visit.    Allergies  Allergen Reactions   Penicillins Rash    ROS Review of Systems  Constitutional:  Positive for fatigue.  HENT: Negative.    Respiratory: Negative.    Cardiovascular: Negative.   Gastrointestinal: Negative.   Genitourinary: Negative.   Skin: Negative.   Neurological: Negative.      Objective:    Physical Exam Vitals and nursing note reviewed.  Constitutional:      General: She is not in acute distress.    Appearance: Normal appearance.  HENT:     Head: Normocephalic and atraumatic.  Eyes:     Conjunctiva/sclera: Conjunctivae normal.  Cardiovascular:     Rate and Rhythm: Normal rate and regular rhythm.     Pulses: Normal pulses.     Heart sounds: Murmur heard.  Pulmonary:     Effort: Pulmonary effort is normal.     Breath sounds: Normal breath sounds.  Musculoskeletal:     Cervical back: Normal range of motion.  Skin:    General: Skin is warm and dry.  Neurological:     General: No focal deficit present.     Mental Status: She is alert and oriented to person, place, and time.  Psychiatric:        Mood and Affect: Mood normal.        Behavior: Behavior normal.        Thought Content:  Thought content normal.        Judgment: Judgment normal.    BP (!) 170/70 (BP Location: Left Arm, Patient Position: Sitting)   Pulse 85   Temp (!) 97.5 F (36.4 C)   Ht 5' 3.5" (1.613 m)   Wt 182 lb 6 oz (82.7 kg)   SpO2 97%   BMI 31.80 kg/m  Wt Readings from Last 3 Encounters:  11/09/20 182 lb 6 oz (82.7 kg)  10/12/20 185 lb (83.9 kg)  10/05/20 183 lb 8 oz (83.2 kg)     There are no preventive care reminders to display for this patient.  There are no preventive care reminders to display for this patient.  Lab Results  Component Value Date   TSH 2.760 05/10/2020   Lab Results  Component Value Date   WBC 5.1 10/12/2020   HGB 11.3 (L) 10/12/2020   HCT 35.4 (L) 10/12/2020   MCV 84.5 10/12/2020   PLT 238 10/12/2020   Lab Results  Component Value Date   NA 138 09/09/2020   K 4.6 09/09/2020   CO2 21 09/09/2020   GLUCOSE 91 09/09/2020   BUN 16 09/09/2020   CREATININE 0.61 09/09/2020   BILITOT 0.4 09/09/2020   ALKPHOS 67 09/09/2020   AST 16 09/09/2020   ALT 8 09/09/2020   PROT 6.8 09/09/2020   ALBUMIN 4.7 (H) 09/09/2020   CALCIUM 10.0 09/09/2020   ANIONGAP 10 08/17/2020   EGFR 90 09/09/2020   GFR 92.70 12/28/2014   Lab Results  Component Value Date   CHOL 267 (H) 05/10/2020   Lab Results  Component Value Date   HDL 67 05/10/2020   Lab Results  Component Value Date   LDLCALC 173 (H) 05/10/2020   Lab Results  Component Value Date   TRIG 148 05/10/2020   Lab Results  Component Value Date   CHOLHDL 4.0 05/10/2020   Lab Results  Component Value Date   HGBA1C 5.8 (H) 05/10/2020      Assessment & Plan:   Problem List Items Addressed This Visit       Cardiovascular and Mediastinum   Hypertension - Primary    BP still not controlled, 170/70 today. Will add amlodipine 54m for her to take a bedtime. Discussed low salt diet. Continue lisinopril and carvedilol. Will check BMP today. May need referral to hypertension clinic and/or renal ultrasound.  Follow up in 4 weeks.       Relevant Medications   amLODipine (NORVASC) 5 MG tablet   Other Relevant Orders   Basic Metabolic Panel (BMET)    Meds ordered this encounter  Medications   amLODipine (NORVASC) 5 MG tablet    Sig: Take 1 tablet (5 mg total) by mouth at bedtime.    Dispense:  90 tablet    Refill:  0     Follow-up: Return in about 4 weeks (around 12/07/2020) for BP.    LCharyl Dancer NP

## 2020-11-09 NOTE — Patient Instructions (Addendum)
Start Amlodipine '5mg'$  at bedtime Continue monitoring your blood pressure Keep appointment on 12/10/20

## 2020-11-10 LAB — BASIC METABOLIC PANEL
BUN/Creatinine Ratio: 26 (ref 12–28)
BUN: 19 mg/dL (ref 8–27)
CO2: 21 mmol/L (ref 20–29)
Calcium: 10.3 mg/dL (ref 8.7–10.3)
Chloride: 103 mmol/L (ref 96–106)
Creatinine, Ser: 0.73 mg/dL (ref 0.57–1.00)
Glucose: 88 mg/dL (ref 65–99)
Potassium: 4.5 mmol/L (ref 3.5–5.2)
Sodium: 142 mmol/L (ref 134–144)
eGFR: 83 mL/min/{1.73_m2} (ref >59)

## 2020-11-12 DIAGNOSIS — M1712 Unilateral primary osteoarthritis, left knee: Secondary | ICD-10-CM | POA: Diagnosis not present

## 2020-11-12 DIAGNOSIS — M25562 Pain in left knee: Secondary | ICD-10-CM | POA: Diagnosis not present

## 2020-11-12 DIAGNOSIS — M25561 Pain in right knee: Secondary | ICD-10-CM | POA: Diagnosis not present

## 2020-11-19 ENCOUNTER — Ambulatory Visit: Payer: Self-pay | Admitting: *Deleted

## 2020-11-19 NOTE — Telephone Encounter (Signed)
Called patient to discuss recommendations per Jon Billings, NP. No answer.
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
## 2020-11-19 NOTE — Telephone Encounter (Signed)
Can we please call patient and check to see if has been seen by EMS and recommend that she be evaluated in the ER due.

## 2020-11-20 ENCOUNTER — Other Ambulatory Visit: Payer: Self-pay | Admitting: Nurse Practitioner

## 2020-11-20 DIAGNOSIS — E7800 Pure hypercholesterolemia, unspecified: Secondary | ICD-10-CM

## 2020-11-20 DIAGNOSIS — I10 Essential (primary) hypertension: Secondary | ICD-10-CM

## 2020-11-20 NOTE — Telephone Encounter (Signed)
Requested Prescriptions  Pending Prescriptions Disp Refills  . lisinopril (ZESTRIL) 20 MG tablet [Pharmacy Med Name: LISINOPRIL 20 MG TABLET] 30 tablet     Sig: Take 1 tablet (20 mg total) by mouth daily.     Cardiovascular:  ACE Inhibitors Failed - 11/20/2020  1:00 PM      Failed - Last BP in normal range    BP Readings from Last 1 Encounters:  11/09/20 (!) 170/70         Passed - Cr in normal range and within 180 days    Creatinine, Ser  Date Value Ref Range Status  11/09/2020 0.73 0.57 - 1.00 mg/dL Final         Passed - K in normal range and within 180 days    Potassium  Date Value Ref Range Status  11/09/2020 4.5 3.5 - 5.2 mmol/L Final         Passed - Patient is not pregnant      Passed - Valid encounter within last 6 months    Recent Outpatient Visits          1 week ago Hypertension, unspecified type   [place], Lauren A, NP   1 month ago Hypertension, unspecified type   [facility] Jon Billings, NP   2 months ago Neuropathy   [place], Lauren A, NP   2 months ago Urinary frequency   [place], Santiago Glad, NP   2 months ago Viral upper respiratory tract infection   [facility] Jon Billings, NP      Future Appointments            In 2 weeks Jon Billings, NP [facility], [place]   In 2 weeks Jon Billings, NP [facility], [facility]           . carvedilol (COREG) 6.25 MG tablet [Pharmacy Med Name: CARVEDILOL 6.25 MG TABLET] 180 tablet     Sig: TAKE (1) TABLET TWICE A DAY.     Cardiovascular:  Beta Blockers Failed - 11/20/2020  1:00 PM      Failed - Last BP in normal range    BP Readings from Last 1 Encounters:  11/09/20 (!) 170/70         Passed - Last Heart Rate in normal range    Pulse Readings from Last 1 Encounters:  11/09/20 85         Passed - Valid encounter within last 6 months    Recent Outpatient Visits           1 week ago Hypertension, unspecified type   [place], Lauren A, NP   1 month ago Hypertension, unspecified type   [facility] Jon Billings, NP   2 months ago Neuropathy   [place], NP   2 months ago Urinary frequency   [place], Santiago Glad, NP   2 months ago Viral upper respiratory tract infection   [facility] Jon Billings, NP      Future Appointments            In 2 weeks Jon Billings, NP [facility], [place]   In 2 weeks Jon Billings, NP [facility], [place]

## 2020-11-20 NOTE — Telephone Encounter (Signed)
dc'd 08/17/20 Completed course- Novella Olive RN

## 2020-11-22 MED ORDER — ROSUVASTATIN CALCIUM 10 MG PO TABS: 10.0000 mg | ORAL_TABLET | Freq: Every day | ORAL | 1 refills | Status: DC

## 2020-11-22 MED ORDER — LISINOPRIL 40 MG PO TABS: 40.0000 mg | ORAL_TABLET | Freq: Every day | ORAL | 1 refills | Status: DC

## 2020-11-22 NOTE — Telephone Encounter (Signed)
Routing to provider for refills. 

## 2020-11-22 NOTE — Telephone Encounter (Signed)
Pt calling in stating that she is in need of having the lisinopril refilled. She states that she has been taking this regularly and that she is completely out of this medication. She states that she is also in need of the rosuvastatin refilled as she only has 5 pills left. Please advise.        [place], [place] - [place] [address]  [address] [place] [address]  Phone: [phone] Fax: [phone]  Hours: Not open 24 hours

## 2020-11-23 ENCOUNTER — Ambulatory Visit: Payer: Medicare PPO | Admitting: Nurse Practitioner

## 2020-11-23 ENCOUNTER — Other Ambulatory Visit: Payer: Self-pay

## 2020-11-23 DIAGNOSIS — Z96651 Presence of right artificial knee joint: Secondary | ICD-10-CM | POA: Diagnosis not present

## 2020-11-23 DIAGNOSIS — M1712 Unilateral primary osteoarthritis, left knee: Secondary | ICD-10-CM | POA: Diagnosis not present

## 2020-11-23 MED ORDER — CARVEDILOL 6.25 MG PO TABS: 6.2500 mg | ORAL_TABLET | Freq: Two times a day (BID) | ORAL | 1 refills | Status: DC

## 2020-11-25 ENCOUNTER — Encounter: Payer: Self-pay | Admitting: Nurse Practitioner

## 2020-11-25 ENCOUNTER — Other Ambulatory Visit: Payer: Self-pay

## 2020-11-25 ENCOUNTER — Ambulatory Visit: Payer: Medicare PPO | Admitting: Nurse Practitioner

## 2020-11-25 VITALS — BP 177/76 | HR 86 | Temp 98.1°F | Wt 182.0 lb

## 2020-11-25 DIAGNOSIS — M25562 Pain in left knee: Secondary | ICD-10-CM | POA: Diagnosis not present

## 2020-11-25 DIAGNOSIS — F419 Anxiety disorder, unspecified: Secondary | ICD-10-CM

## 2020-11-25 DIAGNOSIS — M1712 Unilateral primary osteoarthritis, left knee: Secondary | ICD-10-CM | POA: Diagnosis not present

## 2020-11-25 DIAGNOSIS — I10 Essential (primary) hypertension: Secondary | ICD-10-CM | POA: Diagnosis not present

## 2020-11-25 DIAGNOSIS — E7800 Pure hypercholesterolemia, unspecified: Secondary | ICD-10-CM

## 2020-11-25 DIAGNOSIS — M25561 Pain in right knee: Secondary | ICD-10-CM | POA: Diagnosis not present

## 2020-11-25 MED ORDER — CARVEDILOL 6.25 MG PO TABS: 6.2500 mg | ORAL_TABLET | Freq: Two times a day (BID) | ORAL | 1 refills | Status: AC

## 2020-11-25 MED ORDER — SERTRALINE HCL 25 MG PO TABS: 25.0000 mg | ORAL_TABLET | Freq: Every day | ORAL | 0 refills | Status: DC

## 2020-11-25 MED ORDER — ROSUVASTATIN CALCIUM 10 MG PO TABS: 10.0000 mg | ORAL_TABLET | Freq: Every day | ORAL | 1 refills | Status: AC

## 2020-11-25 MED ORDER — LISINOPRIL 40 MG PO TABS: 40.0000 mg | ORAL_TABLET | Freq: Every day | ORAL | 1 refills | Status: DC

## 2020-11-25 MED ORDER — AMLODIPINE BESYLATE 5 MG PO TABS: 5.0000 mg | ORAL_TABLET | Freq: Every day | ORAL | 1 refills | Status: DC

## 2020-11-25 NOTE — Assessment & Plan Note (Addendum)
Recommended that patient leave a urine sample to rule out a UTI due to finding one incidentally back in June.  Patient declined to leave a urine sample and didn't feel like it was necessary.  Will start patient on Zoloft '25mg'$  daily to try and help her control her anxiety. Side effects and benefits of medication discussed with patient during visit. Follow up in 1 month for reevaluation.  Her thoughts made sense.  Patient was able to hold a conversation and discuss her mediations appropriately. Denies SI.  If patient's symptoms are not improved at next visit will change medication from Zoloft to Abilify.
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
## 2020-11-26 ENCOUNTER — Telehealth: Payer: Self-pay | Admitting: Nurse Practitioner

## 2020-11-26 NOTE — Telephone Encounter (Signed)
Called patient and left message to let her know I was following up after our visit yesterday to see how she was doing.  Also requested that she move her appointment up from 4 weeks to 2 weeks.

## 2020-11-30 ENCOUNTER — Ambulatory Visit: Payer: Self-pay | Admitting: *Deleted

## 2020-11-30 NOTE — Telephone Encounter (Signed)
Reason for Disposition  [1] Caller has URGENT medicine question about med that PCP or specialist prescribed AND [2] triager unable to answer question  Answer Assessment - Initial Assessment Questions 1. NAME of MEDICATION: "What medicine are you calling about?"     Patient has questions about her medications: patient is not taking her BP medication 2. QUESTION: "What is your question?" (e.g., double dose of medicine, side effect)     Patient has questions about her dosing 3. PRESCRIBING HCP: "Who prescribed it?" Reason: if prescribed by specialist, call should be referred to that group.     PCP 4. SYMPTOMS: "Do you have any symptoms?"     Weak- patient has not taking any of her medications 5. SEVERITY: If symptoms are present, ask "Are they mild, moderate or severe?"     severe Patient questions recorded- when she stated she has not been taking her mediations- advised her to check her BP- patient states she feels weak- 177/79 P 97. Patient states she is not wanting to go to ED- asked if I could call EMS for her and she refused. Patient request check with provider about medications and then call her back.  Medication questions:  Zoloft- she states provider told her 10 mg- and she got 25 mg. Lisinopril 40 mg- patient states she has been taking 2 in am- and directions have changed to 1 daily. Carvedilol 6.25- directions are to take with meal-she has never taken with meal Patient questions #90 of pills- advised this was 3 month supply. She questions may cause dizziness on label- advised patient that can be SE of medication- but if she has been medication long term without dizziness- she should not have problem Patient states she is not taking any of her BP medications- complains of weakness- will not allow me to call EMS for her.  Protocols used: Medication Question Call-A-AH

## 2020-11-30 NOTE — Telephone Encounter (Signed)
Called patient and reviewed medications. Discussed dosing for Zoloft and the reason for the Lisinopril being changed from 2 20mg  tabs to 1 40mg  tab. Discussed with patient that she can take the [place] with food if she is not tolerating it without food. Recommend she take her blood pressure medications.  While I was on the phone with the patient she was throwing up and having a hard time answering my questions.  She agreed to let me call 911. I called 911 to let them know that she was throwing up and having a difficult time holding a conversation.   Patient declines wanting to change the medication from Zoloft to Abilify due to it "being for mental people".  I tried to explain to the patient that it can help calm her down which is what she was asking for during her visit and help with sleep but patient declines.

## 2020-12-02 ENCOUNTER — Other Ambulatory Visit: Payer: Self-pay

## 2020-12-02 ENCOUNTER — Emergency Department: Payer: Medicare PPO

## 2020-12-02 ENCOUNTER — Inpatient Hospital Stay
Admission: EM | Admit: 2020-12-02 | Discharge: 2020-12-13 | DRG: 329 | Disposition: A | Discharge status: Skilled Nursing Facility | Payer: Medicare PPO | Attending: Internal Medicine | Admitting: Internal Medicine

## 2020-12-02 ENCOUNTER — Ambulatory Visit: Payer: Self-pay | Admitting: *Deleted

## 2020-12-02 ENCOUNTER — Encounter
Admission: EM | Disposition: A | Discharge status: Skilled Nursing Facility | Payer: Self-pay | Source: Home / Self Care | Attending: Internal Medicine

## 2020-12-02 ENCOUNTER — Emergency Department: Payer: Medicare PPO | Admitting: Anesthesiology

## 2020-12-02 ENCOUNTER — Inpatient Hospital Stay: Payer: Medicare PPO

## 2020-12-02 DIAGNOSIS — D649 Anemia, unspecified: Secondary | ICD-10-CM | POA: Diagnosis present

## 2020-12-02 DIAGNOSIS — G9389 Other specified disorders of brain: Secondary | ICD-10-CM | POA: Diagnosis not present

## 2020-12-02 DIAGNOSIS — Z833 Family history of diabetes mellitus: Secondary | ICD-10-CM | POA: Diagnosis not present

## 2020-12-02 DIAGNOSIS — R0602 Shortness of breath: Secondary | ICD-10-CM

## 2020-12-02 DIAGNOSIS — F32A Depression, unspecified: Secondary | ICD-10-CM | POA: Diagnosis present

## 2020-12-02 DIAGNOSIS — M170 Bilateral primary osteoarthritis of knee: Secondary | ICD-10-CM | POA: Diagnosis not present

## 2020-12-02 DIAGNOSIS — Z136 Encounter for screening for cardiovascular disorders: Secondary | ICD-10-CM | POA: Diagnosis not present

## 2020-12-02 DIAGNOSIS — T148XXA Other injury of unspecified body region, initial encounter: Secondary | ICD-10-CM | POA: Diagnosis not present

## 2020-12-02 DIAGNOSIS — I6503 Occlusion and stenosis of bilateral vertebral arteries: Secondary | ICD-10-CM | POA: Diagnosis not present

## 2020-12-02 DIAGNOSIS — R0600 Dyspnea, unspecified: Secondary | ICD-10-CM | POA: Diagnosis not present

## 2020-12-02 DIAGNOSIS — N179 Acute kidney failure, unspecified: Secondary | ICD-10-CM | POA: Diagnosis present

## 2020-12-02 DIAGNOSIS — Z515 Encounter for palliative care: Secondary | ICD-10-CM | POA: Diagnosis not present

## 2020-12-02 DIAGNOSIS — Z20822 Contact with and (suspected) exposure to covid-19: Secondary | ICD-10-CM | POA: Diagnosis not present

## 2020-12-02 DIAGNOSIS — I6782 Cerebral ischemia: Secondary | ICD-10-CM | POA: Diagnosis not present

## 2020-12-02 DIAGNOSIS — D509 Iron deficiency anemia, unspecified: Secondary | ICD-10-CM | POA: Diagnosis present

## 2020-12-02 DIAGNOSIS — R52 Pain, unspecified: Secondary | ICD-10-CM

## 2020-12-02 DIAGNOSIS — F419 Anxiety disorder, unspecified: Secondary | ICD-10-CM | POA: Diagnosis present

## 2020-12-02 DIAGNOSIS — F259 Schizoaffective disorder, unspecified: Secondary | ICD-10-CM | POA: Diagnosis present

## 2020-12-02 DIAGNOSIS — E785 Hyperlipidemia, unspecified: Secondary | ICD-10-CM | POA: Diagnosis not present

## 2020-12-02 DIAGNOSIS — R7303 Prediabetes: Secondary | ICD-10-CM | POA: Diagnosis present

## 2020-12-02 DIAGNOSIS — Z4682 Encounter for fitting and adjustment of non-vascular catheter: Secondary | ICD-10-CM | POA: Diagnosis not present

## 2020-12-02 DIAGNOSIS — W19XXXA Unspecified fall, initial encounter: Secondary | ICD-10-CM | POA: Diagnosis present

## 2020-12-02 DIAGNOSIS — I6389 Other cerebral infarction: Secondary | ICD-10-CM | POA: Diagnosis not present

## 2020-12-02 DIAGNOSIS — Z9889 Other specified postprocedural states: Secondary | ICD-10-CM | POA: Diagnosis not present

## 2020-12-02 DIAGNOSIS — Z8249 Family history of ischemic heart disease and other diseases of the circulatory system: Secondary | ICD-10-CM

## 2020-12-02 DIAGNOSIS — E559 Vitamin D deficiency, unspecified: Secondary | ICD-10-CM | POA: Diagnosis present

## 2020-12-02 DIAGNOSIS — F418 Other specified anxiety disorders: Secondary | ICD-10-CM | POA: Diagnosis not present

## 2020-12-02 DIAGNOSIS — Q283 Other malformations of cerebral vessels: Secondary | ICD-10-CM | POA: Diagnosis not present

## 2020-12-02 DIAGNOSIS — Z808 Family history of malignant neoplasm of other organs or systems: Secondary | ICD-10-CM

## 2020-12-02 DIAGNOSIS — K567 Ileus, unspecified: Secondary | ICD-10-CM | POA: Diagnosis not present

## 2020-12-02 DIAGNOSIS — K5669 Other partial intestinal obstruction: Secondary | ICD-10-CM | POA: Diagnosis not present

## 2020-12-02 DIAGNOSIS — M25461 Effusion, right knee: Secondary | ICD-10-CM | POA: Diagnosis not present

## 2020-12-02 DIAGNOSIS — S83124A Posterior dislocation of proximal end of tibia, right knee, initial encounter: Secondary | ICD-10-CM | POA: Diagnosis present

## 2020-12-02 DIAGNOSIS — R404 Transient alteration of awareness: Secondary | ICD-10-CM | POA: Diagnosis not present

## 2020-12-02 DIAGNOSIS — R41841 Cognitive communication deficit: Secondary | ICD-10-CM | POA: Diagnosis not present

## 2020-12-02 DIAGNOSIS — K4030 Unilateral inguinal hernia, with obstruction, without gangrene, not specified as recurrent: Secondary | ICD-10-CM | POA: Diagnosis not present

## 2020-12-02 DIAGNOSIS — I639 Cerebral infarction, unspecified: Secondary | ICD-10-CM | POA: Diagnosis not present

## 2020-12-02 DIAGNOSIS — J342 Deviated nasal septum: Secondary | ICD-10-CM | POA: Diagnosis not present

## 2020-12-02 DIAGNOSIS — K562 Volvulus: Secondary | ICD-10-CM | POA: Diagnosis present

## 2020-12-02 DIAGNOSIS — K4130 Unilateral femoral hernia, with obstruction, without gangrene, not specified as recurrent: Secondary | ICD-10-CM | POA: Diagnosis not present

## 2020-12-02 DIAGNOSIS — Z85828 Personal history of other malignant neoplasm of skin: Secondary | ICD-10-CM

## 2020-12-02 DIAGNOSIS — Z90710 Acquired absence of both cervix and uterus: Secondary | ICD-10-CM

## 2020-12-02 DIAGNOSIS — K4090 Unilateral inguinal hernia, without obstruction or gangrene, not specified as recurrent: Secondary | ICD-10-CM | POA: Diagnosis not present

## 2020-12-02 DIAGNOSIS — Z66 Do not resuscitate: Secondary | ICD-10-CM | POA: Diagnosis present

## 2020-12-02 DIAGNOSIS — I6601 Occlusion and stenosis of right middle cerebral artery: Secondary | ICD-10-CM | POA: Diagnosis not present

## 2020-12-02 DIAGNOSIS — G459 Transient cerebral ischemic attack, unspecified: Secondary | ICD-10-CM | POA: Diagnosis not present

## 2020-12-02 DIAGNOSIS — I672 Cerebral atherosclerosis: Secondary | ICD-10-CM | POA: Diagnosis not present

## 2020-12-02 DIAGNOSIS — E876 Hypokalemia: Secondary | ICD-10-CM | POA: Diagnosis present

## 2020-12-02 DIAGNOSIS — Z8673 Personal history of transient ischemic attack (TIA), and cerebral infarction without residual deficits: Secondary | ICD-10-CM | POA: Diagnosis not present

## 2020-12-02 DIAGNOSIS — K449 Diaphragmatic hernia without obstruction or gangrene: Secondary | ICD-10-CM | POA: Diagnosis not present

## 2020-12-02 DIAGNOSIS — N993 Prolapse of vaginal vault 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: Secondary | ICD-10-CM | POA: Diagnosis not present

## 2020-12-02 DIAGNOSIS — I10 Essential (primary) hypertension: Secondary | ICD-10-CM | POA: Diagnosis not present

## 2020-12-02 DIAGNOSIS — Z87891 Personal history of nicotine dependence: Secondary | ICD-10-CM

## 2020-12-02 DIAGNOSIS — K56609 Unspecified intestinal obstruction, unspecified as to partial versus complete obstruction: Secondary | ICD-10-CM | POA: Diagnosis not present

## 2020-12-02 DIAGNOSIS — S83114A Anterior dislocation of proximal end of tibia, right knee, initial encounter: Secondary | ICD-10-CM | POA: Diagnosis not present

## 2020-12-02 DIAGNOSIS — I499 Cardiac arrhythmia, unspecified: Secondary | ICD-10-CM | POA: Diagnosis not present

## 2020-12-02 DIAGNOSIS — R1312 Dysphagia, oropharyngeal phase: Secondary | ICD-10-CM | POA: Diagnosis not present

## 2020-12-02 DIAGNOSIS — Z4659 Encounter for fitting and adjustment of other gastrointestinal appliance and device: Secondary | ICD-10-CM

## 2020-12-02 DIAGNOSIS — E782 Mixed hyperlipidemia: Secondary | ICD-10-CM | POA: Diagnosis present

## 2020-12-02 DIAGNOSIS — Z7189 Other specified counseling: Secondary | ICD-10-CM | POA: Diagnosis not present

## 2020-12-02 DIAGNOSIS — R531 Weakness: Secondary | ICD-10-CM | POA: Diagnosis not present

## 2020-12-02 DIAGNOSIS — R29810 Facial weakness: Secondary | ICD-10-CM | POA: Diagnosis not present

## 2020-12-02 DIAGNOSIS — G9340 Encephalopathy, unspecified: Secondary | ICD-10-CM | POA: Diagnosis not present

## 2020-12-02 DIAGNOSIS — Z0189 Encounter for other specified special examinations: Secondary | ICD-10-CM

## 2020-12-02 DIAGNOSIS — F061 Catatonic disorder due to known physiological condition: Secondary | ICD-10-CM | POA: Diagnosis not present

## 2020-12-02 DIAGNOSIS — Z7401 Bed confinement status: Secondary | ICD-10-CM | POA: Diagnosis not present

## 2020-12-02 DIAGNOSIS — R0902 Hypoxemia: Secondary | ICD-10-CM | POA: Diagnosis not present

## 2020-12-02 DIAGNOSIS — S83104A Unspecified dislocation of right knee, initial encounter: Secondary | ICD-10-CM | POA: Diagnosis not present

## 2020-12-02 DIAGNOSIS — R4020 Unspecified coma: Secondary | ICD-10-CM | POA: Diagnosis present

## 2020-12-02 DIAGNOSIS — K55029 Acute infarction of small intestine, extent unspecified: Secondary | ICD-10-CM | POA: Diagnosis not present

## 2020-12-02 DIAGNOSIS — M6281 Muscle weakness (generalized): Secondary | ICD-10-CM | POA: Diagnosis not present

## 2020-12-02 DIAGNOSIS — I700 Atherosclerosis of aorta: Secondary | ICD-10-CM | POA: Diagnosis not present

## 2020-12-02 DIAGNOSIS — R109 Unspecified abdominal pain: Secondary | ICD-10-CM | POA: Diagnosis not present

## 2020-12-02 DIAGNOSIS — G319 Degenerative disease of nervous system, unspecified: Secondary | ICD-10-CM | POA: Diagnosis not present

## 2020-12-02 DIAGNOSIS — R262 Difficulty in walking, not elsewhere classified: Secondary | ICD-10-CM | POA: Diagnosis not present

## 2020-12-02 DIAGNOSIS — F05 Delirium due to known physiological condition: Secondary | ICD-10-CM | POA: Diagnosis not present

## 2020-12-02 DIAGNOSIS — I9581 Postprocedural hypotension: Secondary | ICD-10-CM | POA: Diagnosis not present

## 2020-12-02 DIAGNOSIS — I959 Hypotension, unspecified: Secondary | ICD-10-CM | POA: Diagnosis not present

## 2020-12-02 DIAGNOSIS — R2689 Other abnormalities of gait and mobility: Secondary | ICD-10-CM | POA: Diagnosis not present

## 2020-12-02 HISTORY — PX: INGUINAL HERNIA REPAIR: SHX194

## 2020-12-02 HISTORY — PX: BOWEL RESECTION: SHX1257

## 2020-12-02 LAB — COMPREHENSIVE METABOLIC PANEL
ALT: 16 U/L (ref 0–44)
AST: 33 U/L (ref 15–41)
Albumin: 4.8 g/dL (ref 3.5–5.0)
Alkaline Phosphatase: 57 U/L (ref 38–126)
Anion gap: 13 (ref 5–15)
BUN: 31 mg/dL — ABNORMAL HIGH (ref 8–23)
CO2: 21 mmol/L — ABNORMAL LOW (ref 22–32)
Calcium: 9.9 mg/dL (ref 8.9–10.3)
Chloride: 98 mmol/L (ref 98–111)
Creatinine, Ser: 0.76 mg/dL (ref 0.44–1.00)
GFR, Estimated: >60 mL/min (ref >60)
Glucose, Bld: 152 mg/dL — ABNORMAL HIGH (ref 70–99)
Potassium: 4.4 mmol/L (ref 3.5–5.1)
Sodium: 132 mmol/L — ABNORMAL LOW (ref 135–145)
Total Bilirubin: 2.1 mg/dL — ABNORMAL HIGH (ref 0.3–1.2)
Total Protein: 8.1 g/dL (ref 6.5–8.1)

## 2020-12-02 LAB — RESP PANEL BY RT-PCR (FLU A&B, COVID) ARPGX2
Influenza A by PCR: NEGATIVE
Influenza B by PCR: NEGATIVE
SARS Coronavirus 2 by RT PCR: NEGATIVE

## 2020-12-02 LAB — LIPASE, BLOOD: Lipase: 25 U/L (ref 11–51)

## 2020-12-02 LAB — CBC
HCT: 41.4 % (ref 36.0–46.0)
Hemoglobin: 14.6 g/dL (ref 12.0–15.0)
MCH: 30.6 pg (ref 26.0–34.0)
MCHC: 35.3 g/dL (ref 30.0–36.0)
MCV: 86.8 fL (ref 80.0–100.0)
Platelets: 263 10*3/uL (ref 150–400)
RBC: 4.77 MIL/uL (ref 3.87–5.11)
RDW: 14.9 % (ref 11.5–15.5)
WBC: 16 10*3/uL — ABNORMAL HIGH (ref 4.0–10.5)
nRBC: 0 % (ref 0.0–0.2)

## 2020-12-02 SURGERY — REPAIR, HERNIA, INGUINAL, ADULT
Anesthesia: General | Site: Abdomen | Laterality: Right

## 2020-12-02 MED ORDER — ROCURONIUM BROMIDE 100 MG/10ML IV SOLN
INTRAVENOUS | Status: DC | PRN
  Administered 2020-12-02: 50 mg via INTRAVENOUS

## 2020-12-02 MED ORDER — EPHEDRINE 5 MG/ML INJ
INTRAVENOUS | Status: AC
  Filled 2020-12-02: qty 5

## 2020-12-02 MED ORDER — PHENYLEPHRINE HCL (PRESSORS) 10 MG/ML IV SOLN
INTRAVENOUS | Status: DC | PRN
  Administered 2020-12-02 (×2): 150 ug via INTRAVENOUS

## 2020-12-02 MED ORDER — LABETALOL HCL 5 MG/ML IV SOLN
INTRAVENOUS | Status: DC | PRN
  Administered 2020-12-02: 10 mg via INTRAVENOUS

## 2020-12-02 MED ORDER — FENTANYL CITRATE (PF) 100 MCG/2ML IJ SOLN
INTRAMUSCULAR | Status: DC | PRN
  Administered 2020-12-02 (×4): 50 ug via INTRAVENOUS

## 2020-12-02 MED ORDER — SUCCINYLCHOLINE CHLORIDE 200 MG/10ML IV SOSY
PREFILLED_SYRINGE | INTRAVENOUS | Status: DC | PRN
Start: 2020-12-02 — End: 2020-12-02
  Administered 2020-12-02: 80 mg via INTRAVENOUS

## 2020-12-02 MED ORDER — LACTATED RINGERS IV SOLN: INTRAVENOUS | Status: DC | PRN

## 2020-12-02 MED ORDER — 0.9 % SODIUM CHLORIDE (POUR BTL) OPTIME
TOPICAL | Status: DC | PRN
  Administered 2020-12-02: 1000 mL

## 2020-12-02 MED ORDER — DEXAMETHASONE SODIUM PHOSPHATE 10 MG/ML IJ SOLN
INTRAMUSCULAR | Status: DC | PRN
  Administered 2020-12-02: 10 mg via INTRAVENOUS

## 2020-12-02 MED ORDER — EPHEDRINE SULFATE 50 MG/ML IJ SOLN
INTRAMUSCULAR | Status: DC | PRN
  Administered 2020-12-02: 5 mg via INTRAVENOUS

## 2020-12-02 MED ORDER — FENTANYL CITRATE PF 50 MCG/ML IJ SOSY
50.0000 ug | PREFILLED_SYRINGE | Freq: Once | INTRAMUSCULAR | Status: AC
  Administered 2020-12-02: 50 ug via INTRAVENOUS
  Filled 2020-12-02: qty 1

## 2020-12-02 MED ORDER — SUGAMMADEX SODIUM 200 MG/2ML IV SOLN
INTRAVENOUS | Status: DC | PRN
  Administered 2020-12-02: 200 mg via INTRAVENOUS

## 2020-12-02 MED ORDER — IOHEXOL 350 MG/ML SOLN
80.0000 mL | Freq: Once | INTRAVENOUS | Status: AC | PRN
  Administered 2020-12-02: 80 mL via INTRAVENOUS

## 2020-12-02 MED ORDER — LACTATED RINGERS IV BOLUS
1000.0000 mL | Freq: Once | INTRAVENOUS | Status: AC
  Administered 2020-12-02: 1000 mL via INTRAVENOUS

## 2020-12-02 MED ORDER — ROCURONIUM BROMIDE 10 MG/ML (PF) SYRINGE
PREFILLED_SYRINGE | INTRAVENOUS | Status: AC
  Filled 2020-12-02: qty 10

## 2020-12-02 MED ORDER — ONDANSETRON HCL 4 MG/2ML IJ SOLN
INTRAMUSCULAR | Status: DC | PRN
  Administered 2020-12-02: 4 mg via INTRAVENOUS

## 2020-12-02 MED ORDER — LIDOCAINE HCL (CARDIAC) PF 100 MG/5ML IV SOSY
PREFILLED_SYRINGE | INTRAVENOUS | Status: DC | PRN
  Administered 2020-12-02: 50 mg via INTRAVENOUS

## 2020-12-02 MED ORDER — DEXAMETHASONE SODIUM PHOSPHATE 10 MG/ML IJ SOLN
INTRAMUSCULAR | Status: AC
  Filled 2020-12-02: qty 1

## 2020-12-02 MED ORDER — FENTANYL CITRATE (PF) 100 MCG/2ML IJ SOLN: 25.0000 ug | INTRAMUSCULAR | Status: DC | PRN

## 2020-12-02 MED ORDER — SODIUM CHLORIDE 0.9 % IV SOLN
INTRAVENOUS | Status: DC | PRN
  Administered 2020-12-02: 50 ug/min via INTRAVENOUS

## 2020-12-02 MED ORDER — CEFAZOLIN SODIUM-DEXTROSE 2-4 GM/100ML-% IV SOLN
2.0000 g | INTRAVENOUS | Status: DC
  Filled 2020-12-02: qty 100

## 2020-12-02 MED ORDER — PIPERACILLIN-TAZOBACTAM 3.375 G IVPB
3.3750 g | Freq: Three times a day (TID) | INTRAVENOUS | Status: DC
  Administered 2020-12-02 – 2020-12-09 (×20): 3.375 g via INTRAVENOUS
  Filled 2020-12-02 (×19): qty 50

## 2020-12-02 MED ORDER — ONDANSETRON HCL 4 MG/2ML IJ SOLN
4.0000 mg | Freq: Once | INTRAMUSCULAR | Status: AC
  Administered 2020-12-02: 4 mg via INTRAVENOUS
  Filled 2020-12-02: qty 2

## 2020-12-02 MED ORDER — CEFAZOLIN SODIUM-DEXTROSE 2-3 GM-%(50ML) IV SOLR
INTRAVENOUS | Status: DC | PRN
  Administered 2020-12-02: 2 g via INTRAVENOUS

## 2020-12-02 MED ORDER — SUCCINYLCHOLINE CHLORIDE 200 MG/10ML IV SOSY
PREFILLED_SYRINGE | INTRAVENOUS | Status: AC
  Filled 2020-12-02: qty 10

## 2020-12-02 MED ORDER — PROPOFOL 10 MG/ML IV BOLUS
INTRAVENOUS | Status: DC | PRN
  Administered 2020-12-02: 80 mg via INTRAVENOUS
  Administered 2020-12-02: 40 mg via INTRAVENOUS

## 2020-12-02 MED ORDER — OXYCODONE HCL 5 MG PO TABS: 5.0000 mg | ORAL_TABLET | Freq: Once | ORAL | Status: DC | PRN

## 2020-12-02 MED ORDER — MORPHINE SULFATE (PF) 4 MG/ML IV SOLN: 4.0000 mg | Freq: Once | INTRAVENOUS | Status: DC

## 2020-12-02 MED ORDER — FENTANYL CITRATE (PF) 100 MCG/2ML IJ SOLN
INTRAMUSCULAR | Status: AC
  Filled 2020-12-02: qty 2

## 2020-12-02 MED ORDER — LIDOCAINE HCL (PF) 2 % IJ SOLN
INTRAMUSCULAR | Status: AC
  Filled 2020-12-02: qty 5

## 2020-12-02 MED ORDER — ACETAMINOPHEN 10 MG/ML IV SOLN: 1000.0000 mg | Freq: Once | INTRAVENOUS | Status: DC | PRN

## 2020-12-02 MED ORDER — ACETAMINOPHEN 10 MG/ML IV SOLN
INTRAVENOUS | Status: AC
  Filled 2020-12-02: qty 100

## 2020-12-02 MED ORDER — BUPIVACAINE LIPOSOME 1.3 % IJ SUSP
INTRAMUSCULAR | Status: DC | PRN
  Administered 2020-12-02: 20 mL

## 2020-12-02 MED ORDER — ONDANSETRON HCL 4 MG/2ML IJ SOLN: 4.0000 mg | Freq: Once | INTRAMUSCULAR | Status: DC | PRN

## 2020-12-02 MED ORDER — BUPIVACAINE-EPINEPHRINE (PF) 0.5% -1:200000 IJ SOLN
INTRAMUSCULAR | Status: DC | PRN
  Administered 2020-12-02: 30 mL via PERINEURAL

## 2020-12-02 MED ORDER — ONDANSETRON HCL 4 MG/2ML IJ SOLN
INTRAMUSCULAR | Status: AC
  Filled 2020-12-02: qty 2

## 2020-12-02 MED ORDER — CEFAZOLIN SODIUM 1 G IJ SOLR
INTRAMUSCULAR | Status: AC
  Filled 2020-12-02: qty 20

## 2020-12-02 MED ORDER — OXYCODONE HCL 5 MG/5ML PO SOLN: 5.0000 mg | Freq: Once | ORAL | Status: DC | PRN

## 2020-12-02 MED ORDER — ACETAMINOPHEN 10 MG/ML IV SOLN
INTRAVENOUS | Status: DC | PRN
  Administered 2020-12-02: 1000 mg via INTRAVENOUS

## 2020-12-02 SURGICAL SUPPLY — 73 items
ADH SKN CLS APL DERMABOND .7 (GAUZE/BANDAGES/DRESSINGS)
APL PRP STRL LF DISP 70% ISPRP (MISCELLANEOUS) ×2
BLADE CLIPPER SURG (BLADE) IMPLANT
BLADE SURG 15 STRL LF DISP TIS (BLADE) ×2 IMPLANT
BLADE SURG 15 STRL SS (BLADE) ×3
CHLORAPREP W/TINT 26 (MISCELLANEOUS) ×3 IMPLANT
DERMABOND ADVANCED (GAUZE/BANDAGES/DRESSINGS)
DERMABOND ADVANCED .7 DNX12 (GAUZE/BANDAGES/DRESSINGS) IMPLANT
DRAIN PENROSE 12X.25 LTX STRL (MISCELLANEOUS) ×3 IMPLANT
DRAPE LAPAROTOMY 100X77 ABD (DRAPES) ×3 IMPLANT
DRSG OPSITE POSTOP 4X10 (GAUZE/BANDAGES/DRESSINGS) IMPLANT
DRSG OPSITE POSTOP 4X12 (GAUZE/BANDAGES/DRESSINGS) IMPLANT
DRSG OPSITE POSTOP 4X8 (GAUZE/BANDAGES/DRESSINGS) ×3 IMPLANT
ELECT BLADE 6 FLAT ULTRCLN (ELECTRODE) IMPLANT
ELECT BLADE 6.5 EXT (BLADE) ×3 IMPLANT
ELECT CAUTERY BLADE 6.4 (BLADE) ×3 IMPLANT
ELECT REM PT RETURN 9FT ADLT (ELECTROSURGICAL) ×3
ELECTRODE REM PT RTRN 9FT ADLT (ELECTROSURGICAL) ×2 IMPLANT
GAUZE 4X4 16PLY ~~LOC~~+RFID DBL (SPONGE) ×3 IMPLANT
GLOVE SURG SYN 6.5 ES PF (GLOVE) ×6 IMPLANT
GLOVE SURG UNDER POLY LF SZ7 (GLOVE) ×3 IMPLANT
GOWN STRL REUS W/ TWL LRG LVL3 (GOWN DISPOSABLE) ×6 IMPLANT
GOWN STRL REUS W/TWL LRG LVL3 (GOWN DISPOSABLE) ×9
HANDLE SUCTION POOLE (INSTRUMENTS) IMPLANT
HANDLE YANKAUER SUCT BULB TIP (MISCELLANEOUS) ×3 IMPLANT
KIT TURNOVER KIT A (KITS) ×3 IMPLANT
LABEL OR SOLS (LABEL) ×3 IMPLANT
LIGASURE IMPACT 36 18CM CVD LR (INSTRUMENTS) IMPLANT
MANIFOLD NEPTUNE II (INSTRUMENTS) ×3 IMPLANT
NEEDLE HYPO 22GX1.5 SAFETY (NEEDLE) ×6 IMPLANT
NS IRRIG 1000ML POUR BTL (IV SOLUTION) ×3 IMPLANT
NS IRRIG 500ML POUR BTL (IV SOLUTION) ×6 IMPLANT
PACK BASIN MAJOR ARMC (MISCELLANEOUS) ×3 IMPLANT
PACK BASIN MINOR ARMC (MISCELLANEOUS) ×3 IMPLANT
PLUG INGUINAL HERNIA MESH LG (Mesh General) ×3 IMPLANT
RELOAD LINEAR CUT PROX 55 BLUE (ENDOMECHANICALS) ×6 IMPLANT
RELOAD PROXIMATE 75MM BLUE (ENDOMECHANICALS) IMPLANT
RELOAD STAPLER LINEAR PROX 30 (STAPLE) IMPLANT
SEALER TISSUE X1 CVD JAW (INSTRUMENTS) ×3 IMPLANT
SPONGE T-LAP 18X18 ~~LOC~~+RFID (SPONGE) ×3 IMPLANT
STAPLER GUN LINEAR PROX 60 (STAPLE) ×3 IMPLANT
STAPLER PROXIMATE 55 BLUE (STAPLE) ×3 IMPLANT
STAPLER PROXIMATE 75MM BLUE (STAPLE) IMPLANT
STAPLER RELOAD LINEAR PROX 30 (STAPLE)
STAPLER SKIN PROX 35W (STAPLE) ×3 IMPLANT
SUCTION POOLE HANDLE (INSTRUMENTS)
SUT ETHIBOND NAB MO 7 #0 18IN (SUTURE) ×3 IMPLANT
SUT MNCRL 4-0 (SUTURE) ×3
SUT MNCRL 4-0 27XMFL (SUTURE) ×2
SUT PDS AB 1 TP1 54 (SUTURE) ×6 IMPLANT
SUT PROLENE 3 0 PS 2 (SUTURE) IMPLANT
SUT SILK 2 0 (SUTURE)
SUT SILK 2-0 18XBRD TIE 12 (SUTURE) IMPLANT
SUT SILK 3 0 (SUTURE)
SUT SILK 3 0 12 30 (SUTURE) IMPLANT
SUT SILK 3 0 REEL (SUTURE) IMPLANT
SUT SILK 3 0 SH 30 (SUTURE) IMPLANT
SUT SILK 3-0 (SUTURE) ×6 IMPLANT
SUT SILK 3-0 18XBRD TIE 12 (SUTURE) IMPLANT
SUT VIC AB 0 CT1 18XCR BRD 8 (SUTURE) IMPLANT
SUT VIC AB 0 CT1 8-18 (SUTURE)
SUT VIC AB 2-0 CT2 27 (SUTURE) ×3 IMPLANT
SUT VIC AB 3-0 SH 27 (SUTURE) ×6
SUT VIC AB 3-0 SH 27X BRD (SUTURE) ×4 IMPLANT
SUT VIC AB 3-0 SH 8-18 (SUTURE) ×3 IMPLANT
SUTURE MNCRL 4-0 27XMF (SUTURE) ×2 IMPLANT
SYR 10ML LL (SYRINGE) ×3 IMPLANT
SYR 20ML LL LF (SYRINGE) ×3 IMPLANT
SYR BULB IRRIG 60ML STRL (SYRINGE) ×3 IMPLANT
TOWEL OR 17X26 4PK STRL BLUE (TOWEL DISPOSABLE) ×3 IMPLANT
TRAY FOLEY SLVR 16FR LF STAT (SET/KITS/TRAYS/PACK) ×3 IMPLANT
WATER STERILE IRR 1000ML POUR (IV SOLUTION) ×3 IMPLANT
WATER STERILE IRR 500ML POUR (IV SOLUTION) ×3 IMPLANT

## 2020-12-02 NOTE — Anesthesia Preprocedure Evaluation (Addendum)
Anesthesia Evaluation  Patient identified by MRN, date of birth, ID band Patient awake  General Assessment Comment:Incarcerated hernia, SBO  Reviewed: Allergy & Precautions, NPO status , Patient's Chart, lab work & pertinent test results  History of Anesthesia Complications Negative for: history of anesthetic complications  Airway Mallampati: II  TM Distance: <3 FB Neck ROM: Full    Dental  (+) Lower Dentures, Upper Dentures   Pulmonary neg pulmonary ROS, neg sleep apnea, neg COPD, Patient abstained from smoking.Not current smoker, former smoker,    Pulmonary exam normal breath sounds clear to auscultation       Cardiovascular Exercise Tolerance: Good METShypertension, (-) CAD and (-) Past MI (-) dysrhythmias + Valvular Problems/Murmurs AS and MR  Rhythm:Regular Rate:Normal - Systolic murmurs TTE: 1. Left ventricular ejection fraction, by estimation, is 60 to 65%. The  left ventricle has normal function. The left ventricle has no regional  wall motion abnormalities. There is mild left ventricular hypertrophy.  Left ventricular diastolic parameters  are consistent with Grade I diastolic dysfunction (impaired relaxation).  2. Right ventricular systolic function is normal. The right ventricular  size is normal. Tricuspid regurgitation signal is inadequate for assessing  PA pressure.  3. Mild to moderate mitral valve regurgitation.  4. The aortic valve is moderate calcified. Mild aortic valve stenosis by  aortic valve mean gradient measures 14.5 mmHg. Aortic valve Vmax measures  2.43 m/s. Moderate stenosis by Aortic valve area, by VTI measures 1.26  cm.    Neuro/Psych PSYCHIATRIC DISORDERS Anxiety Schizophrenia negative neurological ROS     GI/Hepatic neg GERD  ,(+)     (-) substance abuse  ,   Endo/Other  neg diabetes  Renal/GU negative Renal ROS     Musculoskeletal  (+) Arthritis ,   Abdominal   Peds   Hematology  (+) anemia ,   Anesthesia Other Findings Past Medical History: No date: Anemia No date: Colon polyp 9/09: History of small bowel obstruction     Comment:  likely due to adhesions- pt refused most dx and tx in               hospital No date: History of vaginal bleeding     Comment:  post menopausal- gyn eval, ? polyp No date: Hyperlipidemia No date: Osteoarthritis     Comment:  knee No date: Schizoaffective disorder     Comment:  paranoid personality- refuses treatment No date: Vitamin D deficiency  Reproductive/Obstetrics                           Anesthesia Physical Anesthesia Plan  ASA: 2 and emergent  Anesthesia Plan: General   Post-op Pain Management:    Induction: Intravenous  PONV Risk Score and Plan: 4 or greater and Ondansetron, Dexamethasone and Treatment may vary due to age or medical condition  Airway Management Planned: Oral ETT  Additional Equipment: None  Intra-op Plan:   Post-operative Plan: Extubation in OR  Informed Consent: I have reviewed the patients History and Physical, chart, labs and discussed the procedure including the risks, benefits and alternatives for the proposed anesthesia with the patient or authorized representative who has indicated his/her understanding and acceptance.     Dental advisory given  Plan Discussed with: CRNA and Surgeon  Anesthesia Plan Comments: (Discussed risks of anesthesia with patient, including PONV, sore throat, lip/dental damage, aspiration, Post operative cognitive dysfunction. Rare risks discussed as well, such as cardiorespiratory and neurological sequelae, and allergic reactions.  Patient understands. Will place preoperative NG tube.)        Anesthesia Quick Evaluation

## 2020-12-02 NOTE — ED Provider Notes (Addendum)
Emergency Medicine Provider Triage Evaluation Note  [name] , a 80 y.o. female  was evaluated in triage.  Pt complains of diffuse upper abdominal pain, nausea, malaise, possible dehydration.  Patient presents with no solid intake in the last 3 days.  Patient has been out to have some some water but is unable to drink water like normal.  Patient states that she has been constipated but had a large bowel movement and is no longer constipated.  No diarrhea.  No dysuria, polyuria, hematuria.  No reported fevers or chills or URI symptoms.  Patient is concerned she may be dehydrated..  Review of Systems  Positive: Nausea, abdominal pain, anorexia Negative: No frank emesis, no dysuria, polyuria, hematuria.  No constipation or diarrhea.  Physical Exam  BP (!) 201/86   Pulse 96   Temp 98.8 F (37.1 C) (Oral)   Resp 18   SpO2 99%  Gen:   Awake, no distress   Resp:  Normal effort  MSK:   Moves extremities without difficulty  Other:  Patient is diffusely tender to palpation in bilateral upper quadrants and epigastric region of the abdomen.  Bowel sounds x4 quadrants.  Mild guarding in the upper quadrants.  No lower quadrant tenderness.  Medical Decision Making  Medically screening exam initiated at 12:41 PM.  Appropriate orders placed.  Lenox Ladouceur was informed that the remainder of the evaluation will be completed by another provider, this initial triage assessment does not replace that evaluation, and the importance of remaining in the ED until their evaluation is complete.  Patient arrives with upper abdominal pain, nausea, decreased oral intake of fluids and solids.  No solid intake for 3 days, very limited fluid intake for 3 days.  Given this patient will have labs, imaging performed.   Addendum: I received a call from the radiologist regarding this patient.  Patient had an incarcerated hernia with small bowel obstruction.  At time of results, we attempted to locate the  patient but could not find her in the waiting room.  She did not have a phone with her.  We even attempted to contact the patient's family members who live in [place] state.  Initial attempts at localizing the patient were unsuccessful and according to the family she did not drive, did not have anybody to drive her home.  We paged overhead for missing persons and eventually located the patient and she was roomed.   Brynda Peon 12/02/20 1244    Blake Divine, MD 12/02/20 1709    Darletta Moll, PA-C 12/02/20 1845    Blake Divine, MD 12/02/20 1850

## 2020-12-02 NOTE — Telephone Encounter (Signed)
Reason for Disposition  [1] SEVERE vomiting (e.g., 6 or more times/day) AND [2] present > 8 hours (Exception: patient sounds well, is drinking liquids, does not sound dehydrated, and vomiting has lasted less than 24 hours)  Answer Assessment - Initial Assessment Questions 1. VOMITING SEVERITY: "How many times have you vomited in the past 24 hours?"     - MILD:  1 - 2 times/day    - MODERATE: 3 - 5 times/day, decreased oral intake without significant weight loss or symptoms of dehydration    - SEVERE: 6 or more times/day, vomits everything or nearly everything, with significant weight loss, symptoms of dehydration      Moderate/severe 2. ONSET: "When did the vomiting begin?"      2 days 3. FLUIDS: "What fluids or food have you vomited up today?" "Have you been able to keep any fluids down?"     Sips only 4. ABDOMINAL PAIN: "Are your having any abdominal pain?" If yes : "How bad is it and what does it feel like?" (e.g., crampy, dull, intermittent, constant)      yes 5. DIARRHEA: "Is there any diarrhea?" If Yes, ask: "How many times today?"      Not asked 6. CONTACTS: "Is there anyone else in the family with the same symptoms?"      N/a 7. CAUSE: "What do you think is causing your vomiting?"     unsure 8. HYDRATION STATUS: "Any signs of dehydration?" (e.g., dry mouth [not only dry lips], too weak to stand) "When did you last urinate?"     Weakness, shaking 9. OTHER SYMPTOMS: "Do you have any other symptoms?" (e.g., fever, headache, vertigo, vomiting blood or coffee grounds, recent head injury)     Dizziness,thirst, unable to eat 10. PREGNANCY: "Is there any chance you are pregnant?" "When was your last menstrual period?"       N/a  Protocols used: Vomiting-A-AH

## 2020-12-02 NOTE — Transfer of Care (Signed)
Immediate Anesthesia Transfer of Care Note  Patient: [name]  Procedure(s) Performed: HERNIA REPAIR INGUINAL ADULT (Right) SMALL BOWEL RESECTION (Abdomen)  Patient Location: PACU  Anesthesia Type:General  Level of Consciousness: drowsy  Airway & Oxygen Therapy: Patient Spontanous Breathing and Patient connected to face mask oxygen  Post-op Assessment: Report given to RN and Post -op Vital signs reviewed and stable  Post vital signs: Reviewed  Last Vitals:  Vitals Value Taken Time  BP 158/67 12/02/20 2050  Temp    Pulse 76 12/02/20 2052  Resp 22 12/02/20 2052  SpO2 100 % 12/02/20 2052  Vitals shown include unvalidated device data.  Last Pain:  Vitals:   12/02/20 1231  TempSrc: Oral         Complications: No notable events documented.

## 2020-12-02 NOTE — H&P (Signed)
Subjective:   CC: right inguinal hernia  HPI:  [name] is a 80 y.o. female who was referred by Tamala Julian for evaluation of above cc.   Symptoms were first noted 3 days ago. Pain is dull, more epigastric, no pain until palpated by ED provider in the ED.Marland Kitchen  Associated with N/V, exacerbated by palpation.  Lump is not reducible.   Does not recall every having a hernia in that area.    Past Medical History:  has a past medical history of Anemia, Colon polyp, History of small bowel obstruction (9/09), History of vaginal bleeding, Hyperlipidemia, Osteoarthritis, Schizoaffective disorder, and Vitamin D deficiency.  Past Surgical History:  Past Surgical History:  Procedure Laterality Date   ANTERIOR AND POSTERIOR REPAIR N/A 04/23/2012   Procedure: ANTERIOR   REPAIR CYSTOCELE;  Surgeon: Reece Packer, MD;  Location: [facility];  Service: Urology;  Laterality: N/A;  cysto;graft 10x6   APPENDECTOMY     bladder tack  1976   KNEE ARTHROSCOPY     right   KNEE ARTHROSCOPY Right    LAPAROSCOPY  1970's   twisted fallopian tube   SALPINGOOPHORECTOMY Bilateral 04/23/2012   Procedure: SALPINGO OOPHORECTOMY;  Surgeon: Emily Filbert, MD;  Location: [facility];  Service: Gynecology;  Laterality: Bilateral;   TONSILLECTOMY     VAGINAL HYSTERECTOMY N/A 04/23/2012   Procedure: HYSTERECTOMY VAGINAL;  Surgeon: Emily Filbert, MD;  Location: [facility];  Service: Gynecology;  Laterality: N/A;   VAGINAL PROLAPSE REPAIR N/A 04/23/2012   Procedure: VAGINAL VAULT SUSPENSION;  Surgeon: Reece Packer, MD;  Location: [facility];  Service: Urology;  Laterality: N/A;    Family History: family history includes Arthritis in her brother and daughter; Cancer in her son; Diabetes in her brother, father, and mother; Heart disease in her father and mother; Hyperlipidemia in her brother; Hypertension in her brother and father; Obesity in her daughter; Skin cancer in her mother.  Social History:  reports that she quit smoking about  38 years ago. Her smoking use included cigarettes. She has a 10.00 pack-year smoking history. She has never used smokeless tobacco. She reports that she does not drink alcohol and does not use drugs.  Current Medications:  Prior to Admission medications   Medication Sig Start Date End Date Taking? Authorizing Provider  amLODipine (NORVASC) 5 MG tablet Take 1 tablet (5 mg total) by mouth at bedtime. 11/25/20   Jon Billings, NP  carvedilol (COREG) 6.25 MG tablet Take 1 tablet (6.25 mg total) by mouth 2 (two) times daily with a meal. 11/25/20   Jon Billings, NP  gabapentin (NEURONTIN) 100 MG capsule Take 1 capsule (100 mg total) by mouth at bedtime. Take 1 capsule at bedtime. If no relief, can go up to 2 tablets at bedtime after 3 days. 09/09/20   McElwee, Lauren A, NP  lisinopril (ZESTRIL) 40 MG tablet Take 1 tablet (40 mg total) by mouth daily. 11/25/20   Jon Billings, NP  rosuvastatin (CRESTOR) 10 MG tablet Take 1 tablet (10 mg total) by mouth daily. 11/25/20   Jon Billings, NP  sertraline (ZOLOFT) 25 MG tablet Take 1 tablet (25 mg total) by mouth daily. 11/25/20   Jon Billings, NP  tiZANidine (ZANAFLEX) 4 MG tablet Take 4 mg by mouth at bedtime. 09/28/20   [provider]  traMADol (ULTRAM) 50 MG tablet Take 50 mg by mouth every 8 (eight) hours as needed. 09/23/20   [provider]  triamcinolone cream (KENALOG) 0.1 % Apply 1 application  topically 2 (two) times daily. 10/12/20   Charyl Dancer, NP    Allergies:  Allergies as of 12/02/2020 - Review Complete 12/02/2020  Allergen Reaction Noted   Penicillins Rash 10/22/2006    ROS:  General: Denies weight loss, weight gain, fatigue, fevers, chills, and night sweats. Eyes: Denies blurry vision, double vision, eye pain, itchy eyes, and tearing. Ears: Denies hearing loss, earache, and ringing in ears. Nose: Denies sinus pain, congestion, infections, runny nose, and nosebleeds. Mouth/throat: Denies hoarseness,  sore throat, bleeding gums, and difficulty swallowing. Heart: Denies chest pain, palpitations, racing heart, irregular heartbeat, leg pain or swelling, and decreased activity tolerance. Respiratory: Denies breathing difficulty, shortness of breath, wheezing, cough, and sputum. GI: Denies change in appetite, heartburn, constipation, diarrhea, and blood in stool. GU: Denies difficulty urinating, pain with urinating, urgency, frequency, blood in urine. Musculoskeletal: Denies joint stiffness, pain, swelling, muscle weakness. Skin: Denies rash, itching, mass, tumors, sores, and boils Neurologic: Denies headache, fainting, dizziness, seizures, numbness, and tingling. Psychiatric: Denies depression, anxiety, difficulty sleeping, and memory loss. Endocrine: Denies heat or cold intolerance, and increased thirst or urination. Blood/lymph: Denies easy bruising, easy bruising, and swollen glands     Objective:     BP (!) 173/77   Pulse 90   Temp 98.8 F (37.1 C) (Oral)   Resp 16   SpO2 99%   Constitutional :  alert, cooperative, appears stated age, and mild distress  Lymphatics/Throat:  no asymmetry, masses, or scars  Respiratory:  clear to auscultation bilaterally  Cardiovascular:  regular rate and rhythm  Gastrointestinal: Soft, no guarding, TTP in suprapubic region . Right inguinal hernia noted.  Erythematous, firm, TTP  Musculoskeletal: lying in bed, no obvious difficulty moving upper extremities  Skin: Cool and moist  Psychiatric: Normal affect, non-agitated, not confused       LABS:  CMP Latest Ref Rng & Units 12/02/2020 11/09/2020 09/09/2020  Glucose 70 - 99 mg/dL 152(H) 88 91  BUN 8 - 23 mg/dL 31(H) 19 16  Creatinine 0.44 - 1.00 mg/dL 0.76 0.73 0.61  Sodium 135 - 145 mmol/L 132(L) 142 138  Potassium 3.5 - 5.1 mmol/L 4.4 4.5 4.6  Chloride 98 - 111 mmol/L 98 103 102  CO2 22 - 32 mmol/L 21(L) 21 21  Calcium 8.9 - 10.3 mg/dL 9.9 10.3 10.0  Total Protein 6.5 - 8.1 g/dL 8.1 - 6.8   Total Bilirubin 0.3 - 1.2 mg/dL 2.1(H) - 0.4  Alkaline Phos 38 - 126 U/L 57 - 67  AST 15 - 41 U/L 33 - 16  ALT 0 - 44 U/L 16 - 8   CBC Latest Ref Rng & Units 12/02/2020 10/12/2020 09/09/2020  WBC 4.0 - 10.5 K/uL 16.0(H) 5.1 6.4  Hemoglobin 12.0 - 15.0 g/dL 14.6 11.3(L) 10.1(L)  Hematocrit 36.0 - 46.0 % 41.4 35.4(L) 33.5(L)  Platelets 150 - 400 K/uL 263 238 301    RADS: CLINICAL DATA:  Abdominal pain, acute, nonlocalized.   EXAM: CT ABDOMEN AND PELVIS WITH CONTRAST   TECHNIQUE: Multidetector CT imaging of the abdomen and pelvis was performed using the standard protocol following bolus administration of intravenous contrast.   CONTRAST:  1mL OMNIPAQUE IOHEXOL 350 MG/ML SOLN   COMPARISON:  12/25/2011   FINDINGS: Lower chest: Lung bases are clear. Large hiatal hernia. There is a right Bochdalek hernia containing fat in the posterior right chest. No pleural effusions.   Hepatobiliary: Normal appearance of the liver, gallbladder and portal venous system. No biliary dilatation.   Pancreas: Unremarkable. No  pancreatic ductal dilatation or surrounding inflammatory changes.   Spleen: Normal in size without focal abnormality.   Adrenals/Urinary Tract: Normal adrenal glands. Bilateral low-density renal cysts. However, there is a hyperdense exophytic structure along the anterior left kidney on sequence 2 image 35 that measures up to 1.0 cm. This may represent a hyperdense cyst due to the fact that the Hounsfield units do not significantly change on the delayed imaging but this lesion is technically indeterminate. Negative for hydronephrosis. Mild distention of the urinary bladder.   Stomach/Bowel: Large hiatal hernia. Dilated loops of small bowel in the abdomen and pelvis. Dilated loop of bowel extending into a right inguinal hernia and there is a transition point associated with this inguinal hernia. Stranding associated with this right inguinal hernia. Findings are suggestive  for an incarcerated inguinal hernia. Distal small bowel is decompressed. Colon is decompressed.   Vascular/Lymphatic: Diffuse atherosclerotic disease in the abdominal aorta without aneurysm. Mildly prominent right inguinal lymph nodes that may be reactive. Right iliac lymph node on series 2, image 57 measures 0.8 cm in the short axis. Otherwise, no significant abdominal or pelvic lymph node enlargement.   Reproductive: Status post hysterectomy. No adnexal masses.   Other: Bilateral inguinal hernias. Right inguinal hernia contains a dilated loop of small bowel. Left inguinal hernia contains fat. Negative for free air. Negative for ascites.   Musculoskeletal: Small amount of stranding in the right groin and around the right inguinal hernia. Grade 1 anterolisthesis of L4 on L5 likely secondary to facet arthropathy. Disc space loss with endplate changes at X8-P3.   IMPRESSION: 1. Small bowel obstruction secondary to an incarcerated right inguinal hernia. Subcutaneous edema in the right groin and right inguinal region likely secondary to the incarcerated hernia. 2. Bilateral renal cysts with an indeterminate hyperdense structure in the anterior left kidney. This could be more definitively characterized with a pre and post-contrast MRI. 3.  Aortic Atherosclerosis (ICD10-I70.0). 4. Left inguinal hernia containing fat. 5. Large hiatal hernia. 6. Right Bochdalek diaphragmatic hernia.   These results were called by telephone at the time of interpretation on 12/02/2020 at 2:16 pm to provider [facility] , who verbally acknowledged these results.     Electronically Signed   By: Markus Daft M.D.   On: 12/02/2020 14:17   Assessment:     Right inguinal/femoral hernia, incarcerated, likely strangulated bowel.  Recommend emergent repair to prevent further decline in health and death. Plan:    Discussed the risk of surgery including recurrence, which can be up to 50% in the case  of incisional or complex hernias, possible use of prosthetic materials (mesh) and the increased risk of mesh infxn if used, bleeding, chronic pain, post-op infxn, post-op SBO or ileus, and possible re-operation to address said risks. The risks of general anesthetic, if used, includes MI, CVA, sudden death or even reaction to anesthetic medications also discussed. Alternatives include continued observation.  Benefits include possible symptom relief, prevention of incarceration, strangulation, enlargement in size over time, and the risk of emergency surgery in the face of strangulation.  Typical post-op recovery time of 3-5 days with 4-6 weeks of activity restrictions were also discussed.  The patient verbalized understanding and all questions were answered to the patient's satisfaction. Still wishes to proceed.  POA daughter notified and she is in agreement as well.  Received initial consult notification at 1648. Pt seen, exam and assessment complted  by 1743.

## 2020-12-02 NOTE — ED Triage Notes (Signed)
Pt comes into the ED via EMS from home with c/o abd pain with N/V for the past 3 days. Thinks she drank spoiled milk or has been poisoned  120HR 190/120 #20gRWrist 279ml NS infused 158/82 90;s 15RR [id number]'s

## 2020-12-02 NOTE — Consult Note (Signed)
Pharmacy Antibiotic Note  [name] is a 80 y.o. female admitted on 12/02/2020 with  right strangulated inguinal hernia / intra-abdominal infection .  Pharmacy has been consulted for Zosyn dosing.  Noted PCN allergy (rash, low severity). Discussed with PACU RN, will check with patient to ensure no major prior reaction to PCN based antibiotics. Expect patient will tolerate Zosyn.  Plan:  Zosyn 3.375 g IV q8h (4-hr infusion)  Temp (24hrs), Avg:97.9 F (36.6 C), Min:97.5 F (36.4 C), Max:98.8 F (37.1 C)  Recent Labs  Lab 12/02/20 1232  WBC 16.0*  CREATININE 0.76    Estimated Creatinine Clearance: 56.8 mL/min (by C-G formula based on SCr of 0.76 mg/dL).    Allergies  Allergen Reactions   Penicillins Rash    Antimicrobials this admission: Cefazolin 9/22 x 1 (surgical ppx) Zosyn 9/22 >>   Dose adjustments this admission: N/A  Microbiology results:   Thank you for allowing pharmacy to be a part of this patient's care.  Benita Gutter 12/02/2020 9:41 PM

## 2020-12-02 NOTE — Telephone Encounter (Signed)
FYI, patient currently at the ER.

## 2020-12-02 NOTE — ED Provider Notes (Signed)
[facility] Emergency Department Provider Note ____________________________________________   Event Date/Time   First MD Initiated Contact with Patient 12/02/20 1605     (approximate)  I have reviewed the triage vital signs and the nursing notes.  HISTORY  Chief Complaint Abdominal Pain   HPI [name] is a 80 y.o. femalewho presents to the ED for evaluation of abd pain.   Chart review indicates history of HTN, HLD, iron deficiency anemia  Patient presents to the ED from home for evaluation of 3 days of abdominal pain, emesis and constipation.  She was at home alone, ambulatory independently with a cane or walker.  Reports 3 days of progressive generalized abdominal pain with nausea and occasional nonbloody nonbilious emesis.  Reports primarily left upper quadrant abdominal pain, 6-10 intensity, nonradiating aching.  Reports no dysuria or urinary changes.  Denies diarrhea, but reports constipation with no bowel movements for 3 to 4 days.  Is not passing gas.  Past Medical History:  Diagnosis Date   Anemia    Colon polyp    History of small bowel obstruction 9/09   likely due to adhesions- pt refused most dx and tx in hospital   History of vaginal bleeding    post menopausal- gyn eval, ? polyp   Hyperlipidemia    Osteoarthritis    knee   Schizoaffective disorder    paranoid personality- refuses treatment   Vitamin D deficiency     Patient Active Problem List   Diagnosis Date Noted   Anxiety 11/25/2020   Aortic valve stenosis 09/09/2020   Prediabetes 05/17/2020   Hypertension 05/10/2020   Age-related osteoporosis without current pathological fracture 01/03/2016   Pure hypercholesterolemia 01/03/2016   Elevated TSH 01/03/2016   Primary osteoarthritis of both knees 12/06/2015   Vitamin D deficiency 06/16/2014   Personal history of other malignant neoplasm of skin 02/19/2014   Prolapse of vaginal vault 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 08/14/2013    Patient on low glycemic diet 08/02/2012   Knee pain 08/02/2012   Gynecological examination 06/20/2011   Neuropathy 06/20/2011   Uterine prolapse 06/20/2011   Anemia 06/18/2009   HYPOKALEMIA, MILD 06/25/2008   INTESTINAL OBSTRUCTION, HX OF 12/11/2007   BLEEDING, POSTMENOPAUSAL 10/23/2006   PERSONALITY DISORDER 10/22/2006   TREMOR, ESSENTIAL 10/22/2006   TRANSAMINASES, SERUM, ELEVATED 10/22/2006    Past Surgical History:  Procedure Laterality Date   ANTERIOR AND POSTERIOR REPAIR N/A 04/23/2012   Procedure: ANTERIOR   REPAIR CYSTOCELE;  Surgeon: Reece Packer, MD;  Location: [facility];  Service: Urology;  Laterality: N/A;  cysto;graft 10x6   APPENDECTOMY     bladder tack  1976   KNEE ARTHROSCOPY     right   KNEE ARTHROSCOPY Right    LAPAROSCOPY  1970's   twisted fallopian tube   SALPINGOOPHORECTOMY Bilateral 04/23/2012   Procedure: SALPINGO OOPHORECTOMY;  Surgeon: Emily Filbert, MD;  Location: [facility];  Service: Gynecology;  Laterality: Bilateral;   TONSILLECTOMY     VAGINAL HYSTERECTOMY N/A 04/23/2012   Procedure: HYSTERECTOMY VAGINAL;  Surgeon: Emily Filbert, MD;  Location: [facility];  Service: Gynecology;  Laterality: N/A;   VAGINAL PROLAPSE REPAIR N/A 04/23/2012   Procedure: VAGINAL VAULT SUSPENSION;  Surgeon: Reece Packer, MD;  Location: [facility];  Service: Urology;  Laterality: N/A;    Prior to Admission medications   Medication Sig Start Date End Date Taking? Authorizing Provider  amLODipine (NORVASC) 5 MG tablet Take 1 tablet (5 mg total) by mouth at bedtime. 11/25/20  Jon Billings, NP  carvedilol (COREG) 6.25 MG tablet Take 1 tablet (6.25 mg total) by mouth 2 (two) times daily with a meal. 11/25/20   Jon Billings, NP  gabapentin (NEURONTIN) 100 MG capsule Take 1 capsule (100 mg total) by mouth at bedtime. Take 1 capsule at bedtime. If no relief, can go up to 2 tablets at bedtime after 3 days. 09/09/20   McElwee, Lauren A, NP  lisinopril (ZESTRIL) 40 MG tablet Take 1  tablet (40 mg total) by mouth daily. 11/25/20   Jon Billings, NP  rosuvastatin (CRESTOR) 10 MG tablet Take 1 tablet (10 mg total) by mouth daily. 11/25/20   Jon Billings, NP  sertraline (ZOLOFT) 25 MG tablet Take 1 tablet (25 mg total) by mouth daily. 11/25/20   Jon Billings, NP  tiZANidine (ZANAFLEX) 4 MG tablet Take 4 mg by mouth at bedtime. 09/28/20   [provider]  traMADol (ULTRAM) 50 MG tablet Take 50 mg by mouth every 8 (eight) hours as needed. 09/23/20   [provider]  triamcinolone cream (KENALOG) 0.1 % Apply 1 application topically 2 (two) times daily. 10/12/20   McElwee, Scheryl Darter, NP    Allergies Penicillins  Family History  Problem Relation Age of Onset   Heart disease Mother    Diabetes Mother    Skin cancer Mother    Diabetes Father    Heart disease Father    Hypertension Father    Diabetes Brother    Arthritis Brother    Hypertension Brother    Hyperlipidemia Brother    Arthritis Daughter    Cancer Son    Obesity Daughter     Social History Social History   Tobacco Use   Smoking status: Former    Packs/day: 0.50    Years: 20.00    Pack years: 10.00    Types: Cigarettes    Quit date: 03/13/1982    Years since quitting: 38.7   Smokeless tobacco: Never  Vaping Use   Vaping Use: Never used  Substance Use Topics   Alcohol use: No   Drug use: No    Review of Systems  Constitutional: No fever/chills Eyes: No visual changes. ENT: No sore throat. Cardiovascular: Denies chest pain. Respiratory: Denies shortness of breath. Gastrointestinal:   No diarrhea.   Positive for abdominal pain, nausea, vomiting and constipation Genitourinary: Negative for dysuria. Musculoskeletal: Negative for back pain. Skin: Negative for rash. Neurological: Negative for headaches, focal weakness or numbness.  ____________________________________________   PHYSICAL EXAM:  VITAL SIGNS: Vitals:   12/02/20 1231 12/02/20 1615  BP: (!) 201/86 (!)  173/77  Pulse: 96 90  Resp: 18 16  Temp: 98.8 F (37.1 C)   SpO2: 99% 99%    Constitutional: Alert and oriented. Well appearing and in no acute distress.  Hard of hearing, but no distress Eyes: Conjunctivae are normal. PERRL. EOMI. Head: Atraumatic. Nose: No congestion/rhinnorhea. Mouth/Throat: Mucous membranes are moist.  Oropharynx non-erythematous. Neck: No stridor. No cervical spine tenderness to palpation. Cardiovascular: Normal rate, regular rhythm. Grossly normal heart sounds.  Good peripheral circulation. Respiratory: Normal respiratory effort.  No retractions. Lungs CTAB. Gastrointestinal: Soft , nondistended, Tenderness to the RLQ, primarily to the right inguinal crease where a firm palpable mass is noted, about 3 cm in diameter and round.  Quite tender to palpation here with involuntary guarding and I am unable to reduce this. Musculoskeletal: No lower extremity tenderness nor edema.  No joint effusions. No signs of acute trauma. Neurologic:  Normal speech and  language. No gross focal neurologic deficits are appreciated.  Skin:  Skin is warm, dry and intact. No rash noted. Psychiatric: Mood and affect are normal. Speech and behavior are normal. ____________________________________________   LABS (all labs ordered are listed, but only abnormal results are displayed)  Labs Reviewed  COMPREHENSIVE METABOLIC PANEL - Abnormal; Notable for the following components:      Result Value   Sodium 132 (*)    CO2 21 (*)    Glucose, Bld 152 (*)    BUN 31 (*)    Total Bilirubin 2.1 (*)    All other components within normal limits  CBC - Abnormal; Notable for the following components:   WBC 16.0 (*)    All other components within normal limits  RESP PANEL BY RT-PCR (FLU A&B, COVID) ARPGX2  LIPASE, BLOOD  URINALYSIS, COMPLETE (UACMP) WITH MICROSCOPIC   ____________________________________________  12 Lead EKG  Sinus rhythm with a rate of 96 bpm.  Normal axis and intervals.  No  STEMI. ____________________________________________  RADIOLOGY  ED MD interpretation: CT reviewed by me with incarcerated right-sided inguinal hernia causing SBO  Official radiology report(s): CT ABDOMEN PELVIS W CONTRAST  Result Date: 12/02/2020 CLINICAL DATA:  Abdominal pain, acute, nonlocalized. EXAM: CT ABDOMEN AND PELVIS WITH CONTRAST TECHNIQUE: Multidetector CT imaging of the abdomen and pelvis was performed using the standard protocol following bolus administration of intravenous contrast. CONTRAST:  45mL OMNIPAQUE IOHEXOL 350 MG/ML SOLN COMPARISON:  12/25/2011 FINDINGS: Lower chest: Lung bases are clear. Large hiatal hernia. There is a right Bochdalek hernia containing fat in the posterior right chest. No pleural effusions. Hepatobiliary: Normal appearance of the liver, gallbladder and portal venous system. No biliary dilatation. Pancreas: Unremarkable. No pancreatic ductal dilatation or surrounding inflammatory changes. Spleen: Normal in size without focal abnormality. Adrenals/Urinary Tract: Normal adrenal glands. Bilateral low-density renal cysts. However, there is a hyperdense exophytic structure along the anterior left kidney on sequence 2 image 35 that measures up to 1.0 cm. This may represent a hyperdense cyst due to the fact that the Hounsfield units do not significantly change on the delayed imaging but this lesion is technically indeterminate. Negative for hydronephrosis. Mild distention of the urinary bladder. Stomach/Bowel: Large hiatal hernia. Dilated loops of small bowel in the abdomen and pelvis. Dilated loop of bowel extending into a right inguinal hernia and there is a transition point associated with this inguinal hernia. Stranding associated with this right inguinal hernia. Findings are suggestive for an incarcerated inguinal hernia. Distal small bowel is decompressed. Colon is decompressed. Vascular/Lymphatic: Diffuse atherosclerotic disease in the abdominal aorta without  aneurysm. Mildly prominent right inguinal lymph nodes that may be reactive. Right iliac lymph node on series 2, image 57 measures 0.8 cm in the short axis. Otherwise, no significant abdominal or pelvic lymph node enlargement. Reproductive: Status post hysterectomy. No adnexal masses. Other: Bilateral inguinal hernias. Right inguinal hernia contains a dilated loop of small bowel. Left inguinal hernia contains fat. Negative for free air. Negative for ascites. Musculoskeletal: Small amount of stranding in the right groin and around the right inguinal hernia. Grade 1 anterolisthesis of L4 on L5 likely secondary to facet arthropathy. Disc space loss with endplate changes at O1-B5. IMPRESSION: 1. Small bowel obstruction secondary to an incarcerated right inguinal hernia. Subcutaneous edema in the right groin and right inguinal region likely secondary to the incarcerated hernia. 2. Bilateral renal cysts with an indeterminate hyperdense structure in the anterior left kidney. This could be more definitively characterized with a pre and  post-contrast MRI. 3.  Aortic Atherosclerosis (ICD10-I70.0). 4. Left inguinal hernia containing fat. 5. Large hiatal hernia. 6. Right Bochdalek diaphragmatic hernia. These results were called by telephone at the time of interpretation on 12/02/2020 at 2:16 pm to provider [facility] , who verbally acknowledged these results. Electronically Signed   By: Markus Daft M.D.   On: 12/02/2020 14:17    ____________________________________________   PROCEDURES and INTERVENTIONS  Procedure(s) performed (including Critical Care):  Procedures  Medications  morphine 4 MG/ML injection 4 mg (has no administration in time range)  iohexol (OMNIPAQUE) 350 MG/ML injection 80 mL (80 mLs Intravenous Contrast Given 12/02/20 1317)  lactated ringers bolus 1,000 mL (1,000 mLs Intravenous New Bag/Given 12/02/20 1636)  fentaNYL (SUBLIMAZE) injection 50 mcg (50 mcg Intravenous Given 12/02/20 1635)   ondansetron (ZOFRAN) injection 4 mg (4 mg Intravenous Given 12/02/20 1635)    ____________________________________________   MDM / ED COURSE   80 year old woman presents from home with 3 days of abdominal pain, with evidence of an incarcerated inguinal hernia causing SBO and likely surgical intervention.  Hemodynamically stable with hypertension.  Exam with obvious left inguinal mass that is firm and irreducible by me.  Blood work with leukocytosis.  CT confirms incarcerated hernia causing SBO.  No evidence of perforation yet.  Discussed with surgery who will evaluate, and anticipate operative intervention.  Clinical Course as of 12/02/20 1654  Thu Dec 02, 2020  1643 Attempted reduction again after fentanyl.  Unsuccessful.  Surgery paged. [DS]  2297 I speak with Dr. Lysle Pearl, surgery, who agrees to come evaluate the patient [DS]    Clinical Course User Index [DS] Vladimir Crofts, MD    ____________________________________________   FINAL CLINICAL IMPRESSION(S) / ED DIAGNOSES  Final diagnoses:  Incarcerated inguinal hernia  Small bowel obstruction [facility])     ED Discharge Orders     None        Irja Wheless   Note:  This document was prepared using Dragon voice recognition software and may include unintentional dictation errors.    Vladimir Crofts, MD 12/02/20 [phone]

## 2020-12-02 NOTE — Op Note (Signed)
Preoperative diagnosis: Right strangulated inguinal Hernia.  Postoperative diagnosis: Right strangulated femoral hernia  Procedure:  Open right femoral hernia repair with mesh, small bowel resection  Anesthesia: General  Surgeon: Dr. Lysle Pearl  Wound Classification: Clean contaminated  Specimen: none  Complications: None  Estimated Blood Loss: 24mL   Indications:  Patient is a 80 y.o. female developed a symptomatic strangulated right femoral hernia. Repair was indicated to avoid further decline in health and possible death.'s please see H&P for further details  Findings: 1.  Strangulated small bowel within right femoral hernia 2.  Large plug mesh used for hernia repair  3.  Patent side to side small bowel anastomosis  Description of procedure: The patient was taken to the operating room. A time-out was completed verifying correct patient, procedure, site, positioning, and implant(s) and/or special equipment prior to beginning this procedure. The right groin was prepped and draped in the usual sterile fashion. An incision was marked in a natural skin crease and planned to end near the pubic tubercle.   Incision made over palpable hernia and dissection carried down to the sac, which was noted to be medial to the palpable femoral artery below the inguinal ligament, consistent with a femoral hernia.  The sac was then opened and dark necrotic loop of small bowel noted along with adjacent strangulated omentum.  The excess sac was removed via Enseal and the necrotic omentum was transected with Enseal as well.  This allowed palpation of the femoral hernia defect.  Attempt was made to pull out more healthy bowel through the defect in preparation for resection and side-to-side anastomosis creation, but this proved unsuccessful.  Decision made at this point to extend the femoral hernia defect slightly superior in order to allow visualization of healthy bowel proximal and distal.  A femoral hernia  defect was widened cephalad, until healthy proximal and distal bowel noted.  Mesenteric defect was made and a 55 mm GIA stapler was used to transect beyond the necrotic portions of the bowel at the proximal and distal end.  The mesentery associated with the necrotic bowel was then transected using the Enseal device.  Hemostasis noted throughout this portion of the procedure.  The 2 ends were then placed side to side and a side-to-side anastomosis was created by creating a enterotomy at 1 end of the staple line and placing the previously used blue load GIA stapler through the defect.  The enterotomies were then closed with a TA stapler.  Staple line reinforced with running 3-0 silk in a Lembert fashion.  Bowel ends were noted to be pink and viable throughout this portion of the procedure prior to reduction back within the abdominal cavity. Infection within the abdominal cavity was noted due to the necrotic tissue, and spillage of bowel contents during the creation of the anastomosis.  The 2 bowel ends were also noted to be not twisted during creation of the anastomosis.  Areas was then extensively irrigated prior to placing a large Bard plug mesh within the femoral hernia defect and secured into place to the surrounding structures using interrupted 0 Ethibond.  Care was noted to ensure there is adequate tissue between the mesh and the femoral artery and vein, and suture placements were well with away from these vessels.  Once the mesh was noted to be securely in place and covering the defect completely, additional tissue surrounding it was used to cover up the mesh by using 3-0 Vicryl in an interrupted fashion.  Wound was again extensively irrigated, Exparel  infused to the incision site, and the subcutaneous tissue approximated using interrupted 3-0 Vicryl.  Skin was then closed with staples and then dressed with honeycomb dressing.  The patient tolerated the procedure well and was taken to the postanesthesia  care unit in stable condition.  Foley and NG placed prior to the procedure remains in place.  Sponge and instrument count correct at end of procedure.

## 2020-12-02 NOTE — ED Notes (Signed)
No answer in lobby.

## 2020-12-02 NOTE — ED Triage Notes (Signed)
See first nurse note- Pt reports being dehydrated. Reports constant nausea and decreased oral intake. Reports upper abdominal pain. Symptoms x3 days.

## 2020-12-02 NOTE — ED Notes (Signed)
First nurse=pt not in lobby or outside.  Pt's family called.

## 2020-12-02 NOTE — Anesthesia Postprocedure Evaluation (Signed)
Anesthesia Post Note  Patient: [name]  Procedure(s) Performed: HERNIA REPAIR INGUINAL ADULT (Right) SMALL BOWEL RESECTION (Abdomen)  Patient location during evaluation: PACU Anesthesia Type: General Level of consciousness: awake and alert Pain management: pain level controlled Vital Signs Assessment: post-procedure vital signs reviewed and stable Respiratory status: spontaneous breathing, nonlabored ventilation, respiratory function stable and patient connected to nasal cannula oxygen Cardiovascular status: blood pressure returned to baseline and stable Postop Assessment: no apparent nausea or vomiting Anesthetic complications: no   No notable events documented.   Last Vitals:  Vitals:   12/02/20 2145 12/02/20 2222  BP: (!) 166/60 (!) 166/58  Pulse: 75 77  Resp: (!) 25   Temp: 36.9 C 36.9 C  SpO2: 96% 96%    Last Pain:  Vitals:   12/02/20 2222  TempSrc: Oral  PainSc:                  Arita Miss

## 2020-12-02 NOTE — Telephone Encounter (Signed)
Patient reports she has been vomiting, dizzy and shaking, not able to eat- only doing sips- weakness. High risk patient-dehydration. Advised ED-patient request EMS- 911 called and they are on the way- patient advised unlock her door. (Due to call earlier this week and patient not taking her medication- called 911 for patient- she states when they were out earlier this week - they told her they thought she has stomach virus)

## 2020-12-02 NOTE — Anesthesia Procedure Notes (Signed)
Procedure Name: Intubation Date/Time: 12/02/2020 7:21 PM Performed by: Rolla Plate, CRNA Pre-anesthesia Checklist: Patient identified, Patient being monitored, Timeout performed, Emergency Drugs available and Suction available Patient Re-evaluated:Patient Re-evaluated prior to induction Oxygen Delivery Method: Circle system utilized Preoxygenation: Pre-oxygenation with 100% oxygen Induction Type: IV induction and Rapid sequence Laryngoscope Size: 3 and McGraph Grade View: Grade I Tube type: Oral Tube size: 7.0 mm Number of attempts: 1 Airway Equipment and Method: Stylet and Video-laryngoscopy Placement Confirmation: ETT inserted through vocal cords under direct vision, positive ETCO2 and breath sounds checked- equal and bilateral Secured at: 20 cm Tube secured with: Tape Dental Injury: Teeth and Oropharynx as per pre-operative assessment  Comments: Pre induction NGT to sxn, VL OETT placement, OP clear

## 2020-12-03 ENCOUNTER — Encounter: Payer: Self-pay | Admitting: Surgery

## 2020-12-03 LAB — BASIC METABOLIC PANEL
Anion gap: 14 (ref 5–15)
BUN: 35 mg/dL — ABNORMAL HIGH (ref 8–23)
CO2: 23 mmol/L (ref 22–32)
Calcium: 9 mg/dL (ref 8.9–10.3)
Chloride: 97 mmol/L — ABNORMAL LOW (ref 98–111)
Creatinine, Ser: 0.6 mg/dL (ref 0.44–1.00)
GFR, Estimated: >60 mL/min (ref >60)
Glucose, Bld: 155 mg/dL — ABNORMAL HIGH (ref 70–99)
Potassium: 3.6 mmol/L (ref 3.5–5.1)
Sodium: 134 mmol/L — ABNORMAL LOW (ref 135–145)

## 2020-12-03 LAB — CBC
HCT: 34.4 % — ABNORMAL LOW (ref 36.0–46.0)
Hemoglobin: 12.1 g/dL (ref 12.0–15.0)
MCH: 30.3 pg (ref 26.0–34.0)
MCHC: 35.2 g/dL (ref 30.0–36.0)
MCV: 86 fL (ref 80.0–100.0)
Platelets: 216 10*3/uL (ref 150–400)
RBC: 4 MIL/uL (ref 3.87–5.11)
RDW: 15.1 % (ref 11.5–15.5)
WBC: 14.4 10*3/uL — ABNORMAL HIGH (ref 4.0–10.5)
nRBC: 0 % (ref 0.0–0.2)

## 2020-12-03 MED ORDER — CARVEDILOL 6.25 MG PO TABS
6.2500 mg | ORAL_TABLET | Freq: Two times a day (BID) | ORAL | Status: DC
  Administered 2020-12-03 – 2020-12-06 (×5): 6.25 mg via ORAL
  Filled 2020-12-03 (×5): qty 1

## 2020-12-03 MED ORDER — ACETAMINOPHEN 325 MG PO TABS
650.0000 mg | ORAL_TABLET | Freq: Four times a day (QID) | ORAL | Status: DC | PRN
  Administered 2020-12-08 – 2020-12-12 (×6): 650 mg via ORAL
  Filled 2020-12-03 (×8): qty 2

## 2020-12-03 MED ORDER — TIZANIDINE HCL 4 MG PO TABS
4.0000 mg | ORAL_TABLET | Freq: Every day | ORAL | Status: DC
  Administered 2020-12-03: 4 mg via ORAL
  Filled 2020-12-03 (×2): qty 1

## 2020-12-03 MED ORDER — TRAMADOL HCL 50 MG PO TABS
50.0000 mg | ORAL_TABLET | Freq: Four times a day (QID) | ORAL | Status: DC | PRN
  Administered 2020-12-07: 50 mg via ORAL
  Filled 2020-12-03 (×2): qty 1

## 2020-12-03 MED ORDER — SODIUM CHLORIDE 0.9 % IV SOLN
INTRAVENOUS | Status: DC | PRN
  Administered 2020-12-03 – 2020-12-08 (×3): 250 mL via INTRAVENOUS

## 2020-12-03 MED ORDER — ONDANSETRON HCL 4 MG/2ML IJ SOLN: 4.0000 mg | Freq: Four times a day (QID) | INTRAMUSCULAR | Status: DC | PRN

## 2020-12-03 MED ORDER — SERTRALINE HCL 50 MG PO TABS
25.0000 mg | ORAL_TABLET | Freq: Every day | ORAL | Status: DC
  Administered 2020-12-03 – 2020-12-13 (×10): 25 mg via ORAL
  Filled 2020-12-03 (×11): qty 1

## 2020-12-03 MED ORDER — METOPROLOL TARTRATE 5 MG/5ML IV SOLN: 5.0000 mg | Freq: Three times a day (TID) | INTRAVENOUS | Status: DC | PRN

## 2020-12-03 MED ORDER — ONDANSETRON 4 MG PO TBDP
4.0000 mg | ORAL_TABLET | Freq: Four times a day (QID) | ORAL | Status: DC | PRN
  Filled 2020-12-03: qty 1

## 2020-12-03 MED ORDER — MORPHINE SULFATE (PF) 2 MG/ML IV SOLN: 1.0000 mg | INTRAVENOUS | Status: DC | PRN

## 2020-12-03 MED ORDER — HYDROCODONE-ACETAMINOPHEN 5-325 MG PO TABS
1.0000 | ORAL_TABLET | ORAL | Status: DC | PRN
  Administered 2020-12-05 – 2020-12-13 (×10): 1 via ORAL
  Filled 2020-12-03 (×11): qty 1

## 2020-12-03 MED ORDER — AMLODIPINE BESYLATE 5 MG PO TABS
5.0000 mg | ORAL_TABLET | Freq: Every day | ORAL | Status: DC
  Administered 2020-12-03 – 2020-12-07 (×5): 5 mg via ORAL
  Filled 2020-12-03 (×5): qty 1

## 2020-12-03 MED ORDER — TRIAMCINOLONE ACETONIDE 0.1 % EX CREA
1.0000 "application " | TOPICAL_CREAM | Freq: Two times a day (BID) | CUTANEOUS | Status: DC
  Administered 2020-12-04 – 2020-12-07 (×5): 1 via TOPICAL
  Filled 2020-12-03 (×3): qty 15

## 2020-12-03 MED ORDER — PANTOPRAZOLE SODIUM 40 MG IV SOLR
40.0000 mg | Freq: Every day | INTRAVENOUS | Status: DC
  Administered 2020-12-03 – 2020-12-07 (×5): 40 mg via INTRAVENOUS
  Filled 2020-12-03 (×5): qty 40

## 2020-12-03 MED ORDER — ENOXAPARIN SODIUM 40 MG/0.4ML IJ SOSY
40.0000 mg | PREFILLED_SYRINGE | INTRAMUSCULAR | Status: DC
  Administered 2020-12-03 – 2020-12-13 (×11): 40 mg via SUBCUTANEOUS
  Filled 2020-12-03 (×11): qty 0.4

## 2020-12-03 MED ORDER — CHLORHEXIDINE GLUCONATE CLOTH 2 % EX PADS
6.0000 | MEDICATED_PAD | Freq: Every day | CUTANEOUS | Status: DC
  Administered 2020-12-03 – 2020-12-08 (×7): 6 via TOPICAL

## 2020-12-03 MED ORDER — LISINOPRIL 20 MG PO TABS
40.0000 mg | ORAL_TABLET | Freq: Every day | ORAL | Status: DC
  Administered 2020-12-03: 40 mg via ORAL
  Filled 2020-12-03: qty 2

## 2020-12-03 MED ORDER — GABAPENTIN 100 MG PO CAPS
100.0000 mg | ORAL_CAPSULE | Freq: Every day | ORAL | Status: DC
  Administered 2020-12-03: 100 mg via ORAL
  Filled 2020-12-03: qty 1

## 2020-12-03 NOTE — Progress Notes (Signed)
Subjective:  CC: [name] is a 80 y.o. female  Hospital stay day 1, 1 Day Post-Op strangulated right femoral hernia repair with mesh, small bowel resection  HPI: No acute issues overnight.  ROS:  General: Denies weight loss, weight gain, fatigue, fevers, chills, and night sweats. Heart: Denies chest pain, palpitations, racing heart, irregular heartbeat, leg pain or swelling, and decreased activity tolerance. Respiratory: Denies breathing difficulty, shortness of breath, wheezing, cough, and sputum. GI: Denies change in appetite, heartburn, nausea, vomiting, constipation, diarrhea, and blood in stool. GU: Denies difficulty urinating, pain with urinating, urgency, frequency, blood in urine.   Objective:   Temp:  [97.5 F (36.4 C)-98.8 F (37.1 C)] 97.6 F (36.4 C) (09/23 0757) Pulse Rate:  [71-96] 81 (09/23 0757) Resp:  [16-33] 18 (09/23 0757) BP: (148-201)/(55-86) 176/61 (09/23 0757) SpO2:  [92 %-100 %] 92 % (09/23 0757)             Intake/Output this shift:   Intake/Output Summary (Last 24 hours) at 12/03/2020 1105 Last data filed at 12/03/2020 0500 Gross per 24 hour  Intake 950 ml  Output 675 ml  Net 275 ml    Constitutional :  alert, cooperative, appears stated age, and no distress  Respiratory:  clear to auscultation bilaterally  Cardiovascular:  regular rate and rhythm  Gastrointestinal: TTP in epigastric region, but soft, no guarding.  Small bruising around incision site in right groin, but remains soft, staples c/d/I.  NG with scant thin output   Skin: Cool and moist.   Psychiatric: Normal affect, non-agitated, not confused       LABS:  CMP Latest Ref Rng & Units 12/03/2020 12/02/2020 11/09/2020  Glucose 70 - 99 mg/dL 155(H) 152(H) 88  BUN 8 - 23 mg/dL 35(H) 31(H) 19  Creatinine 0.44 - 1.00 mg/dL 0.60 0.76 0.73  Sodium 135 - 145 mmol/L 134(L) 132(L) 142  Potassium 3.5 - 5.1 mmol/L 3.6 4.4 4.5  Chloride 98 - 111 mmol/L 97(L) 98 103  CO2 22 - 32 mmol/L  23 21(L) 21  Calcium 8.9 - 10.3 mg/dL 9.0 9.9 10.3  Total Protein 6.5 - 8.1 g/dL - 8.1 -  Total Bilirubin 0.3 - 1.2 mg/dL - 2.1(H) -  Alkaline Phos 38 - 126 U/L - 57 -  AST 15 - 41 U/L - 33 -  ALT 0 - 44 U/L - 16 -   CBC Latest Ref Rng & Units 12/03/2020 12/02/2020 10/12/2020  WBC 4.0 - 10.5 K/uL 14.4(H) 16.0(H) 5.1  Hemoglobin 12.0 - 15.0 g/dL 12.1 14.6 11.3(L)  Hematocrit 36.0 - 46.0 % 34.4(L) 41.4 35.4(L)  Platelets 150 - 400 K/uL 216 263 238    RADS: N/a Assessment:   S/p strangulated right femoral hernia repair with mesh, small bowel resection. foley can come out this am.  ice chips, gum, hard candy is ok to have.  PT to eval and treat. Await ROBF.

## 2020-12-03 NOTE — Evaluation (Signed)
Physical Therapy Evaluation Patient Details Name: [name] MRN: [id number] DOB: 06-12-1940 Today's Date: 12/03/2020  History of Present Illness  Pt admitted for strangulated inguinal hernia with complaints of N/V and abdominal pain. History includes anemia, HLD, and OA. She is now s/p R femoral hernia repair with mesh.  Clinical Impression  Pt is a pleasant 80 year old female who was admitted for abdominal pain and is now s/p R femoral hernia repair. Pt performs transfers with min assist and ambulation with cga and RW. Pt demonstrates deficits with strength/pain/mobility. Would benefit from skilled PT to address above deficits and promote optimal return to PLOF. Recommend transition to [facility] upon discharge from acute hospitalization.      Recommendations for follow up therapy are one component of a multi-disciplinary discharge planning process, led by the attending physician.  Recommendations may be updated based on patient status, additional functional criteria and insurance authorization.  Follow Up Recommendations Home health PT    Equipment Recommendations  3in1 (PT)    Recommendations for Other Services       Precautions / Restrictions Precautions Precautions: Fall Restrictions Weight Bearing Restrictions: No      Mobility  Bed Mobility               General bed mobility comments: recevied seated on EOB with NA.    Transfers Overall transfer level: Needs assistance Equipment used: Rolling walker (2 wheeled) Transfers: Sit to/from Stand Sit to Stand: Min assist         General transfer comment: cues for hand placement as she began reaching out for countertop instead of pushing from seated surface. Once standing, flexed posture noted  Ambulation/Gait Ambulation/Gait assistance: Min guard Gait Distance (Feet): 60 Feet Assistive device: Rolling walker (2 wheeled) Gait Pattern/deviations: Step-through pattern     General Gait Details: ambulated  short distance in hallway using RW. Reciprocal gait pattern performed. Distance limited due to fatigue  Stairs            Wheelchair Mobility    Modified Rankin (Stroke Patients Only)       Balance Overall balance assessment: Needs assistance Sitting-balance support: Feet supported Sitting balance-Leahy Scale: Good     Standing balance support: Bilateral upper extremity supported Standing balance-Leahy Scale: Good                               Pertinent Vitals/Pain Pain Assessment: 0-10 Pain Score: 5  Pain Location: abdomen Pain Descriptors / Indicators: Operative site guarding Pain Intervention(s): Limited activity within patient's tolerance    Home Living Family/patient expects to be discharged to:: Private residence Living Arrangements: Alone   Type of Home: House Home Access: Level entry     Home Layout: One level Home Equipment: Environmental consultant - 2 wheels      Prior Function Level of Independence: Independent with assistive device(s)         Comments: uses RW for all mobility     Hand Dominance        Extremity/Trunk Assessment   Upper Extremity Assessment Upper Extremity Assessment: Overall WFL for tasks assessed    Lower Extremity Assessment Lower Extremity Assessment: Generalized weakness (B LE grossly 4/5)       Communication   Communication: No difficulties  Cognition Arousal/Alertness: Awake/alert Behavior During Therapy: WFL for tasks assessed/performed Overall Cognitive Status: Within Functional Limits for tasks assessed  General Comments      Exercises Other Exercises Other Exercises: ambulated to bathroom with cga. 2 times to the bathroom as pt unable to void. RW used with cga for transfers.   Assessment/Plan    PT Assessment Patient needs continued PT services  PT Problem List Decreased strength;Decreased balance;Decreased mobility;Pain       PT  Treatment Interventions DME instruction;Gait training;Therapeutic exercise;Balance training    PT Goals (Current goals can be found in the Care Plan section)  Acute Rehab PT Goals Patient Stated Goal: to go home PT Goal Formulation: With patient Time For Goal Achievement: 12/17/20 Potential to Achieve Goals: Good    Frequency Min 2X/week   Barriers to discharge        Co-evaluation               AM-PAC PT "6 Clicks" Mobility  Outcome Measure Help needed turning from your back to your side while in a flat bed without using bedrails?: A Little Help needed moving from lying on your back to sitting on the side of a flat bed without using bedrails?: A Little Help needed moving to and from a bed to a chair (including a wheelchair)?: A Little Help needed standing up from a chair using your arms (e.g., wheelchair or bedside chair)?: A Little Help needed to walk in hospital room?: A Little Help needed climbing 3-5 steps with a railing? : A Little 6 Click Score: 18    End of Session Equipment Utilized During Treatment: Gait belt Activity Tolerance: Patient tolerated treatment well Patient left:  (left with call bell while seated on BSC. Notified CNA) Nurse Communication: Mobility status PT Visit Diagnosis: Muscle weakness (generalized) (M62.81);Difficulty in walking, not elsewhere classified (R26.2);Pain Pain - part of body:  (abdomen)    Time: 1130-1156 PT Time Calculation (min) (ACUTE ONLY): 26 min   Charges:   PT Evaluation $PT Eval Low Complexity: 1 Low PT Treatments $Therapeutic Activity: 8-22 mins        Greggory Stallion, PT, DPT [phone]   [name] 12/03/2020, 3:20 PM

## 2020-12-03 NOTE — Plan of Care (Signed)

## 2020-12-04 ENCOUNTER — Inpatient Hospital Stay: Payer: Medicare PPO

## 2020-12-04 ENCOUNTER — Other Ambulatory Visit: Payer: Self-pay

## 2020-12-04 DIAGNOSIS — I639 Cerebral infarction, unspecified: Secondary | ICD-10-CM | POA: Diagnosis not present

## 2020-12-04 DIAGNOSIS — F061 Catatonic disorder due to known physiological condition: Secondary | ICD-10-CM

## 2020-12-04 DIAGNOSIS — G9340 Encephalopathy, unspecified: Secondary | ICD-10-CM

## 2020-12-04 DIAGNOSIS — G9389 Other specified disorders of brain: Secondary | ICD-10-CM

## 2020-12-04 DIAGNOSIS — F05 Delirium due to known physiological condition: Secondary | ICD-10-CM

## 2020-12-04 LAB — BLOOD GAS, ARTERIAL
Acid-Base Excess: 0.9 mmol/L (ref 0.0–2.0)
Bicarbonate: 25.2 mmol/L (ref 20.0–28.0)
FIO2: 0.28
O2 Saturation: 95.6 %
Patient temperature: 37
pCO2 arterial: 38 mmHg (ref 32.0–48.0)
pH, Arterial: 7.43 (ref 7.350–7.450)
pO2, Arterial: 77 mmHg — ABNORMAL LOW (ref 83.0–108.0)

## 2020-12-04 LAB — CBC
HCT: 29.8 % — ABNORMAL LOW (ref 36.0–46.0)
Hemoglobin: 10.4 g/dL — ABNORMAL LOW (ref 12.0–15.0)
MCH: 30.5 pg (ref 26.0–34.0)
MCHC: 34.9 g/dL (ref 30.0–36.0)
MCV: 87.4 fL (ref 80.0–100.0)
Platelets: 184 10*3/uL (ref 150–400)
RBC: 3.41 MIL/uL — ABNORMAL LOW (ref 3.87–5.11)
RDW: 15.1 % (ref 11.5–15.5)
WBC: 11.6 10*3/uL — ABNORMAL HIGH (ref 4.0–10.5)
nRBC: 0 % (ref 0.0–0.2)

## 2020-12-04 LAB — BASIC METABOLIC PANEL
Anion gap: 13 (ref 5–15)
BUN: 48 mg/dL — ABNORMAL HIGH (ref 8–23)
CO2: 25 mmol/L (ref 22–32)
Calcium: 8.6 mg/dL — ABNORMAL LOW (ref 8.9–10.3)
Chloride: 96 mmol/L — ABNORMAL LOW (ref 98–111)
Creatinine, Ser: 1.31 mg/dL — ABNORMAL HIGH (ref 0.44–1.00)
GFR, Estimated: 41 mL/min — ABNORMAL LOW (ref >60)
Glucose, Bld: 128 mg/dL — ABNORMAL HIGH (ref 70–99)
Potassium: 3.4 mmol/L — ABNORMAL LOW (ref 3.5–5.1)
Sodium: 134 mmol/L — ABNORMAL LOW (ref 135–145)

## 2020-12-04 LAB — GLUCOSE, CAPILLARY
Glucose-Capillary: 86 mg/dL (ref 70–99)
Glucose-Capillary: 87 mg/dL (ref 70–99)
Glucose-Capillary: 99 mg/dL (ref 70–99)

## 2020-12-04 LAB — MRSA NEXT GEN BY PCR, NASAL: MRSA by PCR Next Gen: NOT DETECTED

## 2020-12-04 MED ORDER — IOHEXOL 350 MG/ML SOLN
75.0000 mL | Freq: Once | INTRAVENOUS | Status: AC | PRN
  Administered 2020-12-04: 75 mL via INTRAVENOUS

## 2020-12-04 MED ORDER — LORAZEPAM 2 MG/ML IJ SOLN
4.0000 mg | Freq: Once | INTRAMUSCULAR | Status: AC
  Administered 2020-12-04: 4 mg via INTRAVENOUS
  Filled 2020-12-04: qty 2

## 2020-12-04 MED ORDER — LORAZEPAM 2 MG/ML IJ SOLN: 2.0000 mg | Freq: Once | INTRAMUSCULAR | Status: DC

## 2020-12-04 MED ORDER — POTASSIUM CHLORIDE IN NACL 20-0.9 MEQ/L-% IV SOLN
INTRAVENOUS | Status: DC
  Filled 2020-12-04 (×2): qty 1000

## 2020-12-04 MED ORDER — METOPROLOL TARTRATE 5 MG/5ML IV SOLN: 5.0000 mg | Freq: Four times a day (QID) | INTRAVENOUS | Status: DC | PRN

## 2020-12-04 MED ORDER — ATORVASTATIN CALCIUM 20 MG PO TABS
40.0000 mg | ORAL_TABLET | Freq: Every day | ORAL | Status: DC
  Administered 2020-12-05 – 2020-12-08 (×4): 40 mg
  Filled 2020-12-04 (×5): qty 2

## 2020-12-04 NOTE — Progress Notes (Signed)
   12/04/20 1205  Clinical Encounter Type  Visited With Patient  Visit Type Initial  Referral From Nurse  Consult/Referral To Chaplain    Chaplain responded to a code blue page, which was changed to a code stroke. Chaplain ministered with compassionate presence at the PT's door, because medical staff was actively assessing the PT. There was no family presence at bedside. Chaplain checked in with one of the medical staff,who stated everything was ok, and they would notify the chaplain if needed.  Andee Poles, M. Div.

## 2020-12-04 NOTE — Progress Notes (Signed)
   12/04/20 1405  Assess: MEWS Score  Temp 97.9 F (36.6 C)  BP (!) 152/43  Pulse Rate 66  ECG Heart Rate 68  Resp (!) 26  SpO2 97 %  O2 Device Nasal Cannula  O2 Flow Rate (L/min) 2 L/min  Assess: MEWS Score  MEWS Temp 0  MEWS Systolic 0  MEWS Pulse 0  MEWS RR 2  MEWS LOC 3  MEWS Score 5  MEWS Score Color Red  Assess: if the MEWS score is Yellow or Red  Were vital signs taken at a resting state? Yes  Focused Assessment Change from prior assessment (see assessment flowsheet)  Does the patient meet 2 or more of the SIRS criteria? No  Treat  MEWS Interventions Escalated (See documentation below);Administered scheduled meds/treatments (Post Emergency)  Pain Scale Faces  Pain Score Asleep  Take Vital Signs  Increase Vital Sign Frequency  Red: Q 1hr X 4 then Q 4hr X 4, if remains red, continue Q 4hrs  Escalate  MEWS: Escalate Red: discuss with charge nurse/RN and provider, consider discussing with RRT  Notify: Charge Nurse/RN  Name of Charge Nurse/RN Notified Raphael Gibney, Therapist, sports.  Date Charge Nurse/RN Notified 12/04/20  Time Charge Nurse/RN Notified 1400  Notify: Provider  Provider Name/Title Dr. Celine Ahr (Post emergerncy and Interventions carried out. Monitoring continues with on-going interventions.)  Date Provider Notified 12/04/20  Time Provider Notified 1200  Notification Type Page  Notification Reason Change in status  Provider response En route;At bedside  Date of Provider Response 12/04/20  Time of Provider Response 1200  Document  Patient Outcome Stabilized after interventions  Assess: SIRS CRITERIA  SIRS Temperature  0  SIRS Pulse 0  SIRS Respirations  1  SIRS WBC 0  SIRS Score Sum  1

## 2020-12-04 NOTE — Progress Notes (Signed)
   12/04/20 2017  Assess: MEWS Score  Temp 98.5 F (36.9 C)  BP (!) 165/60  Pulse Rate 77  Resp (!) 31  SpO2 98 %  O2 Device Partial Rebreather Mask  O2 Flow Rate (L/min) 6 L/min  Assess: MEWS Score  MEWS Temp 0  MEWS Systolic 0  MEWS Pulse 0  MEWS RR 2  MEWS LOC 3  MEWS Score 5  MEWS Score Color Red  Assess: if the MEWS score is Yellow or Red  Were vital signs taken at a resting state? Yes  Focused Assessment Change from prior assessment (see assessment flowsheet)  MEWS guidelines implemented *See Row Information* Yes  Treat  MEWS Interventions Escalated (See documentation below)  Pain Scale  (Unresponsive)  Take Vital Signs  Increase Vital Sign Frequency  Red: Q 1hr X 4 then Q 4hr X 4, if remains red, continue Q 4hrs  Escalate  MEWS: Escalate Red: discuss with charge nurse/RN and provider, consider discussing with RRT  Notify: Charge Nurse/RN  Name of Charge Nurse/RN Notified Phoebe Sharps  Date Charge Nurse/RN Notified 12/04/20  Time Charge Nurse/RN Notified 2017  Notify: Provider  Provider Name/Title Dr. Celine Ahr  Date Provider Notified 12/04/20  Time Provider Notified 2017  Notification Type Call  Notification Reason Change in status  Provider response En route;See new orders;Other (Comment) (Transfer to ICU)  Date of Provider Response 12/04/20  Time of Provider Response 2017  Document  Patient Outcome Transferred/level of care increased  Assess: SIRS CRITERIA  SIRS Temperature  0  SIRS Pulse 0  SIRS Respirations  1  SIRS WBC 0  SIRS Score Sum  1

## 2020-12-04 NOTE — Consult Note (Addendum)
NAME:  [name], MRN:  [id number], DOB:  03-08-1941, LOS: 2 ADMISSION DATE:  12/02/2020, CONSULTATION DATE: 12/04/2020 REFERRING MD: Dr. Celine Ahr, CHIEF COMPLAINT: Unresponsive  History of Present Illness:  80 year old female presented to [facility] ED from home on 12/02/2020 for evaluation of 3 days of abdominal pain, emesis and constipation.  Per ED documentation patient reported 3 days of progressive generalized abdominal pain with nausea and occasional nonbloody nonbilious emesis.  Abdominal pain primarily left upper quadrant 6 out of 10 intensity nonradiating and aching with associated constipation and is not passing gas. ED course: Initial vitals: Afebrile at 97.5, RR 16, NSR at 90, hypertensive at 173/77 & SPO2 stable at 99% on room air. Significant labs: Na: 132, K+: 4.4, serum CO2 21, BUN 31, Cr: 0.76, leukocytosis of 16.  Other blood work WNL. CT abdomen pelvis with contrast 12/02/2020 > SBO secondary to incarcerated right inguinal hernia.  Subcutaneous edema in the right groin and right inguinal region likely secondary to incarcerated hernia.  Bilateral renal cysts with indeterminate hyperdense structure in the anterior left kidney.  Large hiatal hernia, left inguinal hernia containing fat, right Bochdalek diaphragmatic hernia. Per EDP documentation, exam with obvious left inguinal mass that is firm and irreducible.  General surgery consulted for admission and emergent repair of right inguinal/femoral hernia incarcerated likely strangulated bowel.  Hospital course: Patient completed open right femoral hernia repair with mesh and small bowel resection on 5/46/2703 without complication.  On 12/04/2020 a rapid response, and subsequent code stroke was called the patient was found unresponsive at 10:30 in the morning with a last known well of 7:30 in the morning.  RN attempted to arouse the patient and noted her to be obtunded: eyes open partially to noxious stimuli but patient would not make  eye contact, move, follow commands or attempt to communicate.  Neurology assessed the patient bedside pain in her and and eyes open obtunded state with lingual dyskinesia and upper extremity extensor posturing.  Differential diagnoses included postoperative catatonic state, brainstem stroke or severe metabolic encephalopathy.  The patient was out of the IV thrombolysis time window and not a good candidate considering recent abdominal surgery.  Patient was deemed not a thrombectomy candidate due to no LVO on CTA by neurology. Recommendations from neurology included follow-up MRI and trial Ativan to attempt to relieve a possible catatonic state. CT head angio head neck 12/04/2020 >> no evidence of acute intracranial hemorrhage.  Small acute cortical infarct is questioned within the right parietal lobe.  No LVO identified, there are sites of up to severe stenosis within proximal right M2 MCA vessels and probable 2 mm aneurysm arising from the supraclinoid left ICA. MRI brain 12/04/2020 >> patchy acute cortical and subcortical watershed type ischemic infarcts involving the bilateral cerebral hemispheres right worse than left, no associated hemorrhage or mass-effect.  Underlying moderately advanced chronic microvascular ischemic disease. MRA brain 12/04/2020 >> negative intracranial MRA for large vessel occlusion no hemodynamically significant or correctable stenosis possible 2 mm aneurysm arising from the supraclinoid left ICA  Rapid response called again in the evening of 12/04/2020 due to patient's unresponsive state and concern for airway protection.  PCCM consulted for further management and monitoring and patient transferred to ICU. Pertinent  Medical History  Hypertension Hyperlipidemia Iron deficiency anemia Schizoaffective disorder Small bowel obstruction Osteoarthritis Significant Hospital Events: Including procedures, antibiotic start and stop dates in addition to other pertinent events   12/02/2020  patient admitted after emergent repair of right incarcerated inguinal/femoral hernia 12/04/2020  rapid response, and subsequently code stroke was called due to acute encephalopathy.  Later in the evening patient was transferred to ICU for ongoing encephalopathy and concerns of inability to protect airway.  MRI showing bilateral MCA/ACA watershed infarcts  Interim History / Subjective:  Patient unresponsive to noxious stimuli, no eye-opening/no response to verbal stimuli.  Weak withdrawal in extremities to pain. Vital signs currently stable with patient on Ventimask  Objective   Blood pressure (!) 165/60, pulse 77, temperature 98.5 F (36.9 C), temperature source Oral, resp. rate (!) 31, height 5\' 3"  (1.6 m), weight 82.5 kg, SpO2 98 %.    FiO2 (%):  [35 %] 35 %   Intake/Output Summary (Last 24 hours) at 12/04/2020 2105 Last data filed at 12/04/2020 1949 Gross per 24 hour  Intake 329.06 ml  Output 250 ml  Net 79.06 ml   Filed Weights   12/04/20 0756  Weight: 82.5 kg    Examination: General: Adult female, critically ill, lying in bed, unresponsive to noxious stimuli HEENT: MM pink/moist, anicteric, atraumatic, neck supple Neuro: RASS -4, unable to follow commands, weak withdrawal in all extremities to painful stimuli, no eye-opening or response to verbal stimuli, weak gag/ no cough  corneals present, PERRL +3 CV: s1s2 RRR, NSR on monitor, no r/m/g Pulm: Regular, non labored on Ventimask, breath sounds clear-BUL & diminished-BLL GI: soft, rounded, honeycomb dressing on right lower quadrant-clean dry and intact , bs x 4 GU: foley in place with clear yellow urine Skin: Known RLQ incision covered with honeycomb dressing-clean dry and intact, right labia into right groin appears reddened with some edema present (per chart review this redness and edema was present on admission Extremities: warm/dry, pulses + 2 R/P, no edema noted in [facility] Problem list     Assessment & Plan:   Acute encephalopathy in the setting of bilateral acute cortical and subcortical watershed infarcts PMHx: Hyperlipidemia, HTN, schizoaffective disorder Stat ABG WNL: 7.45/37/83/25.7 - Neurology following appreciate input - Monitor BP every 15 minutes: Goal to maintain SBP between 130 and 180 -Continue amlodipine 5 mg daily and Coreg 6.25 twice daily - Every 4 neurochecks per ICU protocol - Initiate atorvastatin 40 mg daily per tube, per neuro's recommendations will hold off on ASA administration at this time -Supportive care > patient high risk for aspiration, initiate aspiration precautions -CODE STATUS changed to DNR/DNI, see documentation below -Avoid sedating medications: Morphine IV, Zanaflex, gabapentin discontinued -Palliative care consult placed to assist family with ongoing goals of care discussions  Strangulated right inguinal /femoral hernia status post open repair with mesh placement and small bowel resection PMHx: Small bowel resection 2009 -General surgery managing -Continue Zosyn -Continue IVF -Maintain NGT  Acute Kidney Injury in the setting of contrast administration Baseline Cr: 0.7, current Cr : 1.31 - Strict I/O's: alert provider if UOP < 0.5 mL/kg/hr - gentle IVF hydration  -Outpatient lisinopril on hold - Daily BMP, replace electrolytes PRN - Avoid nephrotoxic agents as able, ensure adequate renal perfusion  Goals of care Long discussion with healthcare power of attorney/daughter Caryl Pina via telephone.  We discussed the concern of the patient's ability to protect her airway related to current neurological state and possible need for intubation with mechanical ventilation support.  Caryl Pina made it quite clear that her mother would NOT want to be placed on mechanical ventilation especially under these circumstances.  A full CODE STATUS discussion was completed and HCPOA stated that her mother would not want to be resuscitated if there was  any chance that she would be  in a vegetative state, therefore CODE STATUS was changed to DNR/DNI.  The patient's other daughter was also updated on the events of today and CODE STATUS change.  All questions and concerns answered at this time.  ~ PCCM will sign off, recommend TRH consult to help follow patient through admission and assist with discharge planning. Thank you for allowing PCCM to be a part of this patient's care.  Best Practice (right click and "Reselect all SmartList Selections" daily)  Diet/type: NPO w/ meds via tube DVT prophylaxis: LMWH GI prophylaxis: PPI Lines: N/A Foley:  Yes, and it is still needed Code Status:  DNR Last date of multidisciplinary goals of care discussion [12/04/2020-see goals of care documentation above]  Labs   CBC: Recent Labs  Lab 12/02/20 1232 12/03/20 0558 12/04/20 0535  WBC 16.0* 14.4* 11.6*  HGB 14.6 12.1 10.4*  HCT 41.4 34.4* 29.8*  MCV 86.8 86.0 87.4  PLT 263 216 784    Basic Metabolic Panel: Recent Labs  Lab 12/02/20 1232 12/03/20 0558 12/04/20 0535  NA 132* 134* 134*  K 4.4 3.6 3.4*  CL 98 97* 96*  CO2 21* 23 25  GLUCOSE 152* 155* 128*  BUN 31* 35* 48*  CREATININE 0.76 0.60 1.31*  CALCIUM 9.9 9.0 8.6*   GFR: Estimated Creatinine Clearance: 34.2 mL/min (A) (by C-G formula based on SCr of 1.31 mg/dL (H)). Recent Labs  Lab 12/02/20 1232 12/03/20 0558 12/04/20 0535  WBC 16.0* 14.4* 11.6*    Liver Function Tests: Recent Labs  Lab 12/02/20 1232  AST 33  ALT 16  ALKPHOS 57  BILITOT 2.1*  PROT 8.1  ALBUMIN 4.8   Recent Labs  Lab 12/02/20 1232  LIPASE 25   No results for input(s): AMMONIA in the last 168 hours.  ABG    Component Value Date/Time   PHART 7.43 12/04/2020 1605   PCO2ART 38 12/04/2020 1605   PO2ART 77 (L) 12/04/2020 1605   HCO3 25.2 12/04/2020 1605   O2SAT 95.6 12/04/2020 1605     Coagulation Profile: No results for input(s): INR, PROTIME in the last 168 hours.  Cardiac Enzymes: No results for input(s): CKTOTAL,  CKMB, CKMBINDEX, TROPONINI in the last 168 hours.  HbA1C: Hgb A1c MFr Bld  Date/Time Value Ref Range Status  05/10/2020 11:50 AM 5.8 (H) 4.8 - 5.6 % Final    Comment:             Prediabetes: 5.7 - 6.4          Diabetes: >6.4          Glycemic control for adults with diabetes: <7.0     CBG: Recent Labs  Lab 12/04/20 1204  GLUCAP 99    Review of Systems:   UTA- patient unresponsive  Past Medical History:  She,  has a past medical history of Anemia, Colon polyp, History of small bowel obstruction (9/09), History of vaginal bleeding, Hyperlipidemia, Osteoarthritis, Schizoaffective disorder, and Vitamin D deficiency.   Surgical History:   Past Surgical History:  Procedure Laterality Date   ANTERIOR AND POSTERIOR REPAIR N/A 04/23/2012   Procedure: ANTERIOR   REPAIR CYSTOCELE;  Surgeon: Reece Packer, MD;  Location: [facility];  Service: Urology;  Laterality: N/A;  cysto;graft 10x6   APPENDECTOMY     bladder tack  1976   BOWEL RESECTION N/A 12/02/2020   Procedure: SMALL BOWEL RESECTION;  Surgeon: Benjamine Sprague, DO;  Location: [facility];  Service: General;  Laterality: N/A;  INGUINAL HERNIA REPAIR Right 12/02/2020   Procedure: HERNIA REPAIR INGUINAL ADULT;  Surgeon: Benjamine Sprague, DO;  Location: [facility];  Service: General;  Laterality: Right;   KNEE ARTHROSCOPY     right   KNEE ARTHROSCOPY Right    LAPAROSCOPY  1970's   twisted fallopian tube   SALPINGOOPHORECTOMY Bilateral 04/23/2012   Procedure: SALPINGO OOPHORECTOMY;  Surgeon: Emily Filbert, MD;  Location: [facility];  Service: Gynecology;  Laterality: Bilateral;   TONSILLECTOMY     VAGINAL HYSTERECTOMY N/A 04/23/2012   Procedure: HYSTERECTOMY VAGINAL;  Surgeon: Emily Filbert, MD;  Location: [facility];  Service: Gynecology;  Laterality: N/A;   VAGINAL PROLAPSE REPAIR N/A 04/23/2012   Procedure: VAGINAL VAULT SUSPENSION;  Surgeon: Reece Packer, MD;  Location: [facility];  Service: Urology;  Laterality: N/A;     Social History:    reports that she quit smoking about 38 years ago. Her smoking use included cigarettes. She has a 10.00 pack-year smoking history. She has never used smokeless tobacco. She reports that she does not drink alcohol and does not use drugs.   Family History:  Her family history includes Arthritis in her brother and daughter; Cancer in her son; Diabetes in her brother, father, and mother; Heart disease in her father and mother; Hyperlipidemia in her brother; Hypertension in her brother and father; Obesity in her daughter; Skin cancer in her mother.   Allergies Allergies  Allergen Reactions   Penicillins Rash    TOLERATED CEFAZOLIN     Home Medications  Prior to Admission medications   Medication Sig Start Date End Date Taking? Authorizing Provider  sertraline (ZOLOFT) 25 MG tablet Take 1 tablet (25 mg total) by mouth daily. 11/25/20  Yes Jon Billings, NP  triamcinolone cream (KENALOG) 0.1 % Apply 1 application topically 2 (two) times daily. 10/12/20  Yes McElwee, Lauren A, NP  amLODipine (NORVASC) 5 MG tablet Take 1 tablet (5 mg total) by mouth at bedtime. 11/25/20   Jon Billings, NP  carvedilol (COREG) 6.25 MG tablet Take 1 tablet (6.25 mg total) by mouth 2 (two) times daily with a meal. 11/25/20   Jon Billings, NP  gabapentin (NEURONTIN) 100 MG capsule Take 1 capsule (100 mg total) by mouth at bedtime. Take 1 capsule at bedtime. If no relief, can go up to 2 tablets at bedtime after 3 days. 09/09/20   McElwee, Lauren A, NP  lisinopril (ZESTRIL) 40 MG tablet Take 1 tablet (40 mg total) by mouth daily. 11/25/20   Jon Billings, NP  rosuvastatin (CRESTOR) 10 MG tablet Take 1 tablet (10 mg total) by mouth daily. 11/25/20   Jon Billings, NP  tiZANidine (ZANAFLEX) 4 MG tablet Take 4 mg by mouth at bedtime. 09/28/20   [provider]  traMADol (ULTRAM) 50 MG tablet Take 50 mg by mouth every 8 (eight) hours as needed. Patient not taking: Reported on 12/03/2020 09/23/20   [provider]     Critical care time: 51 minutes       Venetia Night, AGACNP-BC Acute Care Nurse Practitioner [place]   [phone] / [phone] Please see Amion for pager details.

## 2020-12-04 NOTE — Consult Note (Signed)
CODE STROKE- PHARMACY COMMUNICATION   Time CODE STROKE called/page received: 1221  Time response to CODE STROKE was made (in person or via phone): 1225  Time Stroke Kit retrieved from [place] (only if needed):n/a  Name of Provider/Nurse contacted: Dr Celine Ahr, Dr Cheral Marker  Past Medical History:  Diagnosis Date   Anemia    Colon polyp    History of small bowel obstruction 9/09   likely due to adhesions- pt refused most dx and tx in hospital   History of vaginal bleeding    post menopausal- gyn eval, ? polyp   Hyperlipidemia    Osteoarthritis    knee   Schizoaffective disorder    paranoid personality- refuses treatment   Vitamin D deficiency    Prior to Admission medications   Medication Sig Start Date End Date Taking? Authorizing Provider  sertraline (ZOLOFT) 25 MG tablet Take 1 tablet (25 mg total) by mouth daily. 11/25/20  Yes Jon Billings, NP  triamcinolone cream (KENALOG) 0.1 % Apply 1 application topically 2 (two) times daily. 10/12/20  Yes McElwee, Lauren A, NP  amLODipine (NORVASC) 5 MG tablet Take 1 tablet (5 mg total) by mouth at bedtime. 11/25/20   Jon Billings, NP  carvedilol (COREG) 6.25 MG tablet Take 1 tablet (6.25 mg total) by mouth 2 (two) times daily with a meal. 11/25/20   Jon Billings, NP  gabapentin (NEURONTIN) 100 MG capsule Take 1 capsule (100 mg total) by mouth at bedtime. Take 1 capsule at bedtime. If no relief, can go up to 2 tablets at bedtime after 3 days. 09/09/20   McElwee, Lauren A, NP  lisinopril (ZESTRIL) 40 MG tablet Take 1 tablet (40 mg total) by mouth daily. 11/25/20   Jon Billings, NP  rosuvastatin (CRESTOR) 10 MG tablet Take 1 tablet (10 mg total) by mouth daily. 11/25/20   Jon Billings, NP  tiZANidine (ZANAFLEX) 4 MG tablet Take 4 mg by mouth at bedtime. 09/28/20   [provider]  traMADol (ULTRAM) 50 MG tablet Take 50 mg by mouth every 8 (eight) hours as needed. Patient not taking: Reported on 12/03/2020 09/23/20    [provider]    Lu Duffel ,PharmD Clinical Pharmacist  12/04/2020  12:18 PM

## 2020-12-04 NOTE — Progress Notes (Signed)
Preliminary read (per MRI tech) of MRI head indicates that the patient has had a stroke. Awaiting further neurology input and official read.  In the meantime, the patient remains unresponsive, even to noxious stimuli.  Is currently maintaining her airway.  However, due to severity of AMS, will transfer to ICU.  Discussed with Domingo Pulse, NP, who agreed to assist in patient's management.  I also spoke with the patient's daughter, [name], and gave update.

## 2020-12-04 NOTE — Consult Note (Addendum)
NEURO HOSPITALIST CONSULT NOTE   Requestig physician: Dr. Celine Ahr  Reason for Consult: Acute unresponsive state  History obtained from:  RN, Surgeon and Chart     HPI:                                                                                                                                          [name] is an 80 y.o. female who is being evaluated as a Code Stroke after she was noted to be unresponsive this morning. She has a PMHx of anemia, SBO, HLD, schizoaffective disorder and vitamin D deficiency who is POD 2 for strangulated right femoral hernia repair with mesh and small bowel resection.   Initially paged out with a last normal of 1030, on further discussion with the RN, the patient was last awake and cognitively at her baseline with normal movements at 0730. She was checked again at 1030 and appeared to be asleep, but was not woken up at that time. She was next checked at 1140, at which time RN attempted to arouse the patient and noted her to be obtunded. Eyes would open partially to noxious stimuli, but patient would not make eye contact, move, follow commands, or attempt to communicate. Code Stroke was then called.   Past Medical History:  Diagnosis Date   Anemia    Colon polyp    History of small bowel obstruction 9/09   likely due to adhesions- pt refused most dx and tx in hospital   History of vaginal bleeding    post menopausal- gyn eval, ? polyp   Hyperlipidemia    Osteoarthritis    knee   Schizoaffective disorder    paranoid personality- refuses treatment   Vitamin D deficiency     Past Surgical History:  Procedure Laterality Date   ANTERIOR AND POSTERIOR REPAIR N/A 04/23/2012   Procedure: ANTERIOR   REPAIR CYSTOCELE;  Surgeon: Reece Packer, MD;  Location: [facility];  Service: Urology;  Laterality: N/A;  cysto;graft 10x6   APPENDECTOMY     bladder tack  1976   BOWEL RESECTION N/A 12/02/2020   Procedure: SMALL BOWEL  RESECTION;  Surgeon: Benjamine Sprague, DO;  Location: [facility];  Service: General;  Laterality: N/A;   INGUINAL HERNIA REPAIR Right 12/02/2020   Procedure: HERNIA REPAIR INGUINAL ADULT;  Surgeon: Benjamine Sprague, DO;  Location: [facility];  Service: General;  Laterality: Right;   KNEE ARTHROSCOPY     right   KNEE ARTHROSCOPY Right    LAPAROSCOPY  1970's   twisted fallopian tube   SALPINGOOPHORECTOMY Bilateral 04/23/2012   Procedure: SALPINGO OOPHORECTOMY;  Surgeon: Emily Filbert, MD;  Location: [facility];  Service: Gynecology;  Laterality: Bilateral;   TONSILLECTOMY     VAGINAL HYSTERECTOMY N/A 04/23/2012  Procedure: HYSTERECTOMY VAGINAL;  Surgeon: Emily Filbert, MD;  Location: [facility];  Service: Gynecology;  Laterality: N/A;   VAGINAL PROLAPSE REPAIR N/A 04/23/2012   Procedure: VAGINAL VAULT SUSPENSION;  Surgeon: Reece Packer, MD;  Location: [facility];  Service: Urology;  Laterality: N/A;    Family History  Problem Relation Age of Onset   Heart disease Mother    Diabetes Mother    Skin cancer Mother    Diabetes Father    Heart disease Father    Hypertension Father    Diabetes Brother    Arthritis Brother    Hypertension Brother    Hyperlipidemia Brother    Arthritis Daughter    Cancer Son    Obesity Daughter              Social History:  reports that she quit smoking about 38 years ago. Her smoking use included cigarettes. She has a 10.00 pack-year smoking history. She has never used smokeless tobacco. She reports that she does not drink alcohol and does not use drugs.  Allergies  Allergen Reactions   Penicillins Rash    TOLERATED CEFAZOLIN    MEDICATIONS:                                                                                                                     Scheduled:  amLODipine  5 mg Oral QHS   carvedilol  6.25 mg Oral BID WC   Chlorhexidine Gluconate Cloth  6 each Topical Daily   enoxaparin (LOVENOX) injection  40 mg Subcutaneous Q24H   gabapentin  100 mg Oral QHS    lisinopril  40 mg Oral Daily   LORazepam  2 mg Intramuscular Once   pantoprazole (PROTONIX) IV  40 mg Intravenous QHS   sertraline  25 mg Oral Daily   tiZANidine  4 mg Oral QHS   triamcinolone cream  1 application Topical BID   Continuous:  sodium chloride Stopped (12/03/20 2256)   0.9 % NaCl with KCl 20 mEq / L 50 mL/hr at 12/04/20 1619   piperacillin-tazobactam (ZOSYN)  IV 3.375 g (12/04/20 1453)     ROS:                                                                                                                                       Unable to obtain due to encephalopathy.    Blood pressure (!) 94/46,  pulse 61, temperature 97.6 F (36.4 C), temperature source Oral, resp. rate 20, height 5\' 3"  (1.6 m), weight 82.5 kg, SpO2 99 %.   General Examination:                                                                                                       Physical Exam  HEENT-  [place]/AT. Neck is supple  Lungs- Respirations unlabored Ext: No edema  Neurological Examination Mental Status: In an eyes-open, unresponsive state with sonorous respirations and roving EOM, without any reactivity to external stimuli.  Cranial Nerves: II: PERRL 3 mm >> 2 mm. No blink to threat bilaterally   III,IV, VI: Eyes are halfway open. No lid asymmetry. Roving EOM when head is still, with intact doll's eye reflexes to oculocephalic maneuver. No nystagmus. Eyes are conjugate without right or leftward gaze preference in the context of the slow roving movements.   V: No reactions to any facial tactile stimuli VII: Lips are in a roughtly "O" shape. No grimace to any noxious stimuli. Face in this context is symmetric.  VIII: No responses to any verbal stimuli IX,X: Unable to assess XI: Head is midline.  XII: Tongue is observed to be with continuous dyskinetic to-and-fro movements within the oral cavity.  Motor: RUE: Extensor posturing with increased triceps tone, normal deltoid and biceps tone, normal  wrist flexor tone, increased wrist extensor tone.  LUE: Extensor posturing with increased triceps tone, normal deltoid and biceps tone, normal wrist flexor tone, increased wrist extensor tone BLE: Withdraw with hip and knee flexion to noxious plantar stimulation. No asymmetry. Increased flexor tone at the knees bilaterally.  Sensory: Minimal upper extremity movements to pinch of RUE and LUE. Withdrawal of BLE to noxious as above.  Deep Tendon Reflexes: 1+ bilateral brachioradialis. 1+ left patellar, low amplitude right patellar with crossed adductor response on the left (4+), 0 achilles. Toes are upgoing bilaterally  Cerebellar/Gait: Unable to assess    Lab Results: Basic Metabolic Panel: Recent Labs  Lab 12/02/20 1232 12/03/20 0558 12/04/20 0535  NA 132* 134* 134*  K 4.4 3.6 3.4*  CL 98 97* 96*  CO2 21* 23 25  GLUCOSE 152* 155* 128*  BUN 31* 35* 48*  CREATININE 0.76 0.60 1.31*  CALCIUM 9.9 9.0 8.6*    CBC: Recent Labs  Lab 12/02/20 1232 12/03/20 0558 12/04/20 0535  WBC 16.0* 14.4* 11.6*  HGB 14.6 12.1 10.4*  HCT 41.4 34.4* 29.8*  MCV 86.8 86.0 87.4  PLT 263 216 184    Cardiac Enzymes: No results for input(s): CKTOTAL, CKMB, CKMBINDEX, TROPONINI in the last 168 hours.  Lipid Panel: No results for input(s): CHOL, TRIG, HDL, CHOLHDL, VLDL, LDLCALC in the last 168 hours.  Imaging: DG Abd 1 View  Result Date: 12/02/2020 CLINICAL DATA:  NG tube placement. EXAM: ABDOMEN - 1 VIEW COMPARISON:  CT earlier today. FINDINGS: Placement of enteric tube, the tip and side port are within a large hiatal hernia in the lower thorax. Dilated small bowel in the central abdomen similar to CT earlier today. IMPRESSION: Enteric tube with  tip and side-port in the large hiatal hernia in the lower thorax. Dilated small bowel in the central abdomen similar to CT earlier today. Electronically Signed   By: Keith Rake M.D.   On: 12/02/2020 21:23   CT ABDOMEN PELVIS W CONTRAST  Result Date:  12/02/2020 CLINICAL DATA:  Abdominal pain, acute, nonlocalized. EXAM: CT ABDOMEN AND PELVIS WITH CONTRAST TECHNIQUE: Multidetector CT imaging of the abdomen and pelvis was performed using the standard protocol following bolus administration of intravenous contrast. CONTRAST:  60mL OMNIPAQUE IOHEXOL 350 MG/ML SOLN COMPARISON:  12/25/2011 FINDINGS: Lower chest: Lung bases are clear. Large hiatal hernia. There is a right Bochdalek hernia containing fat in the posterior right chest. No pleural effusions. Hepatobiliary: Normal appearance of the liver, gallbladder and portal venous system. No biliary dilatation. Pancreas: Unremarkable. No pancreatic ductal dilatation or surrounding inflammatory changes. Spleen: Normal in size without focal abnormality. Adrenals/Urinary Tract: Normal adrenal glands. Bilateral low-density renal cysts. However, there is a hyperdense exophytic structure along the anterior left kidney on sequence 2 image 35 that measures up to 1.0 cm. This may represent a hyperdense cyst due to the fact that the Hounsfield units do not significantly change on the delayed imaging but this lesion is technically indeterminate. Negative for hydronephrosis. Mild distention of the urinary bladder. Stomach/Bowel: Large hiatal hernia. Dilated loops of small bowel in the abdomen and pelvis. Dilated loop of bowel extending into a right inguinal hernia and there is a transition point associated with this inguinal hernia. Stranding associated with this right inguinal hernia. Findings are suggestive for an incarcerated inguinal hernia. Distal small bowel is decompressed. Colon is decompressed. Vascular/Lymphatic: Diffuse atherosclerotic disease in the abdominal aorta without aneurysm. Mildly prominent right inguinal lymph nodes that may be reactive. Right iliac lymph node on series 2, image 57 measures 0.8 cm in the short axis. Otherwise, no significant abdominal or pelvic lymph node enlargement. Reproductive: Status post  hysterectomy. No adnexal masses. Other: Bilateral inguinal hernias. Right inguinal hernia contains a dilated loop of small bowel. Left inguinal hernia contains fat. Negative for free air. Negative for ascites. Musculoskeletal: Small amount of stranding in the right groin and around the right inguinal hernia. Grade 1 anterolisthesis of L4 on L5 likely secondary to facet arthropathy. Disc space loss with endplate changes at G1-W2. IMPRESSION: 1. Small bowel obstruction secondary to an incarcerated right inguinal hernia. Subcutaneous edema in the right groin and right inguinal region likely secondary to the incarcerated hernia. 2. Bilateral renal cysts with an indeterminate hyperdense structure in the anterior left kidney. This could be more definitively characterized with a pre and post-contrast MRI. 3.  Aortic Atherosclerosis (ICD10-I70.0). 4. Left inguinal hernia containing fat. 5. Large hiatal hernia. 6. Right Bochdalek diaphragmatic hernia. These results were called by telephone at the time of interpretation on 12/02/2020 at 2:16 pm to provider [facility] , who verbally acknowledged these results. Electronically Signed   By: Markus Daft M.D.   On: 12/02/2020 14:17     Assessment: 80 year old female, POD #2 after abdominal surgery for strangulated hernia repair, who was noted to be unresponsive this morning. LKN 0730.  1. Exam reveals a patient in an eyes-open obtunded state with lingual dyskinesia and upper extremity extensor posturing.  2. DDx includes postoperative catatonic state (has risk factor of schizoaffective disorder) and brainstem stroke or severe metabolic encephalopathy. No seizure-like movements noted by RN, nor is there any evidence for seizure activity on neurological exam.  3. CT head shows no no evidence  of acute intracranial hemorrhage. A small acute cortical infarct is questioned within the right parietal lobe. There are advanced chronic small vessel ischemic changes within the  cerebral white matter and generalized parenchymal atrophy. 4. CTA of neck: The common carotid, internal carotid and vertebral arteries are patent within the neck without hemodynamically significant stenosis (50% or greater).  5. CTA of head:  No intracranial large vessel occlusion identified. Atherosclerotic irregularity of the M2 and more distal middle cerebral artery vessels bilaterally. Most notably, there are sites of up to severe stenosis within proximal right M2 MCA vessels. 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 6. Out of the IV thrombolysis time window.  7. Not a thrombectomy candidate due to no LVO on CTA.     Recommendations: 1. MRI brain (ordered)  2. Neuro checks 3. Trial of Ativan 4 mg IV, followed by 2 mg IM (ordered). This protocol has been demonstrated per the literature to relieve 66% of catatonia cases. Will need frequent neuro checks after the doses to assess for possible improvement. RN to please call Neurology for updates on the patient's neuro checks following Ativan.    Addendum: - MRI brain completed. Preliminary review of the images reveals bilateral MCA/ACA territory watershed infarctions. Awaiting official Radiology report.  - BPs were low early this AM and have since improved. Watershed infarcts most likely occurred in the early AM hours given the time course of the BP readings.  - Would keep her SBP above 130 and less than 180. Will need frequent BP readings - IVF.   - Frequent neuro checks - Given that her acute strokes are due to hypotension rather than clot formation, starting ASA would not be high priority, especially given her recent major surgery  Electronically signed: Dr. Kerney Elbe 12/04/2020, 12:31 PM

## 2020-12-04 NOTE — Progress Notes (Signed)
Pt with altered mental status characterized by unresponsiveness, no body movements and shallow respirations upon entering patient's room at about 1140 to administer morning meds. Attempted to awaken pt with no response. From pt.  Code blue initiated. Upon initiation of code, pt was noted to have a pulse and therefore CPR not needed. With multiple sternum rubs,pt only responded by attempting to open eyes; but barely. MD notified. Dr. Celine Ahr immediately rushed up to unit and interventions carried out per MD's order. Vitals taken, EKG performed and Pt was taken for a CT of the head. Pt brought back to unit, placed on telemetry where monitoring and other interventions (per MD's orders) continued. See Providers' notes for details.

## 2020-12-04 NOTE — Progress Notes (Signed)
After  ABG results Celine Ahr, MD wanted increase FIO2 via mask due to mouth breathing.

## 2020-12-04 NOTE — Significant Event (Addendum)
Last seen well time; was 0730 per RN

## 2020-12-04 NOTE — Progress Notes (Signed)
OT Cancellation Note  Patient Details Name: Kimberlyann Hollar MRN: [id number] DOB: [birth year]   Cancelled Treatment:    Reason Eval/Treat Not Completed: Fatigue/lethargy limiting ability to participate;Other (comment) (Pt soundly asleep, unable to be aroused for approrpiate participation in evaluation; will re-attempt as able)  Shanon Payor, OTD OTR/L  12/04/20, 11:26 AM

## 2020-12-04 NOTE — Significant Event (Signed)
Request hospital operator to page out a code stroke.

## 2020-12-04 NOTE — Progress Notes (Signed)
Subjective:  CC: [name] is a 80 y.o. female  Hospital stay day 2, 2 Days Post-Op strangulated right femoral hernia repair with mesh, small bowel resection  HPI: Called to the bedside this afternoon for evaluation of possible code stroke.  Patient was last seen well at 10:30 AM per the bedside nurse.  She was unresponsive and a CODE BLUE was initially called, but she was breathing and had a pulse.  Due to lack of responsiveness, however, consideration was given to the possibility that she was having a stroke.  On my assessment at the bedside, pupils were equal, round and responsive to light.  She did not respond to a hard sternal rub or to toe pinching.  Positive Babinski on the right; no response on the left.  Fingerstick blood sugar was 99.  At the time of this documentation, the patient has gone for a head CT and CT angiogram.  Neurology is seeing her in consultation.  I communicated this information to the patient's daughter who informed me that she has had multiple psychiatric hospitalizations in the past.  Upon questioning, the daughter reported that [name] had never been in a catatonic state previously.  ROS:  Unable to obtain secondary to altered mental status   Objective:   Temp:  [97.5 F (36.4 C)-99 F (37.2 C)] 97.9 F (36.6 C) (09/24 1405) Pulse Rate:  [60-70] 66 (09/24 1405) Resp:  [18-26] 26 (09/24 1405) BP: (90-153)/(43-52) 152/43 (09/24 1405) SpO2:  [94 %-99 %] 97 % (09/24 1405) Weight:  [82.5 kg] 82.5 kg (09/24 0756)     Height: 5\' 3"  (160 cm) Weight: 82.5 kg BMI (Calculated): 32.23   Intake/Output this shift:   Intake/Output Summary (Last 24 hours) at 12/04/2020 1427 Last data filed at 12/04/2020 0630 Gross per 24 hour  Intake 199.55 ml  Output 450 ml  Net -250.45 ml     Constitutional :  See HPI  Respiratory:  clear to auscultation bilaterally  Cardiovascular:  regular rate and rhythm  Gastrointestinal: TTP in epigastric region, but soft, no  guarding.  Small bruising around incision site in right groin, but remains soft, staples c/d/I.  NG with milky appearing output  Skin: Cool and moist.   Psychiatric: Unable to assess secondary to altered mental status       LABS:  CMP Latest Ref Rng & Units 12/04/2020 12/03/2020 12/02/2020  Glucose 70 - 99 mg/dL 128(H) 155(H) 152(H)  BUN 8 - 23 mg/dL 48(H) 35(H) 31(H)  Creatinine 0.44 - 1.00 mg/dL 1.31(H) 0.60 0.76  Sodium 135 - 145 mmol/L 134(L) 134(L) 132(L)  Potassium 3.5 - 5.1 mmol/L 3.4(L) 3.6 4.4  Chloride 98 - 111 mmol/L 96(L) 97(L) 98  CO2 22 - 32 mmol/L 25 23 21(L)  Calcium 8.9 - 10.3 mg/dL 8.6(L) 9.0 9.9  Total Protein 6.5 - 8.1 g/dL - - 8.1  Total Bilirubin 0.3 - 1.2 mg/dL - - 2.1(H)  Alkaline Phos 38 - 126 U/L - - 57  AST 15 - 41 U/L - - 33  ALT 0 - 44 U/L - - 16   CBC Latest Ref Rng & Units 12/04/2020 12/03/2020 12/02/2020  WBC 4.0 - 10.5 K/uL 11.6(H) 14.4(H) 16.0(H)  Hemoglobin 12.0 - 15.0 g/dL 10.4(L) 12.1 14.6  Hematocrit 36.0 - 46.0 % 29.8(L) 34.4(L) 41.4  Platelets 150 - 400 K/uL 184 216 263    RADS: CLINICAL DATA:  Neuro deficit, acute, stroke suspected. Unresponsive.   EXAM: CT ANGIOGRAPHY HEAD AND NECK   TECHNIQUE:  Multidetector CT imaging of the head and neck was performed using the standard protocol during bolus administration of intravenous contrast. Multiplanar CT image reconstructions and MIPs were obtained to evaluate the vascular anatomy. Carotid stenosis measurements (when applicable) are obtained utilizing NASCET criteria, using the distal internal carotid diameter as the denominator.   CONTRAST:  45mL OMNIPAQUE IOHEXOL 350 MG/ML SOLN   COMPARISON:  No pertinent prior exams available for comparison.   FINDINGS: CT HEAD FINDINGS   Brain:   Mild generalized cerebral atrophy.   A subtle acute cortical infarct is questioned within the right parietal lobe (for instance as seen on series 3, image 27).   Background advanced patchy and  ill-defined hypoattenuation within the cerebral white matter, nonspecific but compatible with chronic small vessel ischemic disease.   There is no acute intracranial hemorrhage.   No extra-axial fluid collection.   No evidence of an intracranial mass.   No midline shift.   Vascular: No hyperdense vessel.  Atherosclerotic calcifications.   Skull: Normal. Negative for fracture or focal lesion.   Sinuses: No significant paranasal sinus disease.   Orbits: No mass or acute finding.   Review of the MIP images confirms the above findings   These results were called by telephone at the time of interpretation on 12/04/2020 at 12:55 pm to provider ERIC [facility] , who verbally acknowledged these results.   CTA NECK FINDINGS   Aortic arch: Standard aortic branching. Atherosclerotic plaque within the visualized aortic arch and proximal major branch vessels of the neck. No hemodynamically significant innominate or proximal subclavian artery stenosis.   Right carotid system: CCA and ICA patent within the neck without hemodynamically significant stenosis (50% or greater). Mild-to-moderate atherosclerotic plaque within the carotid bifurcation and proximal ICA.   Left carotid system: CCA and ICA patent within the neck without hemodynamically significant stenosis (50% or greater). Moderate atherosclerotic plaque within the carotid bifurcation and proximal ICA.   Vertebral arteries: Vertebral arteries patent within the neck bilaterally. Mild atherosclerotic narrowing at the origin of the right vertebral artery.   Skeleton: Cervical spondylosis. No acute bony abnormality or aggressive osseous lesion.   Other neck: No neck mass or cervical lymphadenopathy.   Upper chest: Patchy opacity within the imaged left lower lobe pneumonia or atelectasis.   Review of the MIP images confirms the above findings   CTA HEAD FINDINGS   Anterior circulation:   The intracranial internal carotid  arteries are patent. Atherosclerotic plaque within both vessels with up to moderate stenosis. The M1 middle cerebral arteries are patent. Significant atherosclerotic irregularity of the M2 and more distal middle cerebral artery vessels bilaterally. Most notably, there are sites of up to severe stenosis within the proximal M2 right MCA vessels. No proximal M2 branch occlusion is identified. The anterior cerebral arteries are patent. 2 mm inferiorly projecting vascular protrusion arising from the supraclinoid left ICA, likely reflecting an aneurysm.   Posterior circulation:   The intracranial vertebral arteries are patent. The basilar artery is patent. The posterior cerebral arteries are patent. Hypoplastic right P1 segment with sizable right posterior communicating artery. The left posterior communicating artery is hypoplastic or absent   Venous sinuses: Within the limitations of contrast timing, no convincing thrombus.   Anatomic variants: As described   Review of the MIP images confirms the above findings   No intracranial large vessel occlusion identified. These results were called by telephone at the time of interpretation on 12/04/2020 at 12:56 pm to provider ERIC [facility] , who verbally acknowledged  these results.   IMPRESSION: CT head:   1. No evidence of acute intracranial hemorrhage. 2. A small acute cortical infarct is questioned within the right parietal lobe. 3. Advanced chronic small vessel ischemic changes within the cerebral white matter. 4. Generalized parenchymal atrophy.   CTA neck:   1. The common carotid, internal carotid and vertebral arteries are patent within the neck without hemodynamically significant stenosis (50% or greater). 2. Opacity within the imaged left lower lobe, which may reflect atelectasis or pneumonia.   CTA Head:   1. No intracranial large vessel occlusion identified. 2. Atherosclerotic irregularity of the M2 and more distal  middle cerebral artery vessels bilaterally. Most notably, there are sites of up to severe stenosis within proximal right M2 MCA vessels. 3. 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  Labs: Results for KASHONDA, SARKISYAN "MARGARET" (MRN [id number]) as of 12/04/2020 13:25  Ref. Range 12/04/2020 05:35  Sodium Latest Ref Range: 135 - 145 mmol/L 134 (L)  Potassium Latest Ref Range: 3.5 - 5.1 mmol/L 3.4 (L)  Chloride Latest Ref Range: 98 - 111 mmol/L 96 (L)  CO2 Latest Ref Range: 22 - 32 mmol/L 25  Glucose Latest Ref Range: 70 - 99 mg/dL 128 (H)  BUN Latest Ref Range: 8 - 23 mg/dL 48 (H)  Creatinine Latest Ref Range: 0.44 - 1.00 mg/dL 1.31 (H)  Calcium Latest Ref Range: 8.9 - 10.3 mg/dL 8.6 (L)  Anion gap Latest Ref Range: 5 - 15  13  GFR, Estimated Latest Ref Range: >60 mL/min 41 (L)  WBC Latest Ref Range: 4.0 - 10.5 K/uL 11.6 (H)  RBC Latest Ref Range: 3.87 - 5.11 MIL/uL 3.41 (L)  Hemoglobin Latest Ref Range: 12.0 - 15.0 g/dL 10.4 (L)  HCT Latest Ref Range: 36.0 - 46.0 % 29.8 (L)  MCV Latest Ref Range: 80.0 - 100.0 fL 87.4  MCH Latest Ref Range: 26.0 - 34.0 pg 30.5  MCHC Latest Ref Range: 30.0 - 36.0 g/dL 34.9  RDW Latest Ref Range: 11.5 - 15.5 % 15.1  Platelets Latest Ref Range: 150 - 400 K/uL 184  nRBC Latest Ref Range: 0.0 - 0.2 % 0.0   Assessment:   S/p strangulated right femoral hernia repair with mesh, small bowel resection.  Found to have significantly altered mental status earlier this afternoon.  Code stroke initiated.  Imaging findings as copied above.  Currently awaiting neurology input.  Potentially may represent psychiatric disease versus acute metabolic encephalopathy.  Correct electrolytes and hydrate as patient appears to be prerenal with a BUN:Cr of 36 and is not receiving IVF. Lisinopril currently held due to AKI. MRI ordered by neurology.

## 2020-12-04 NOTE — Progress Notes (Signed)
Mobility Specialist - Progress Note   12/04/20 1200  Mobility  Activity Contraindicated/medical hold  Mobility performed by Mobility specialist    Pt not medically appropriate for mobility this date. Will attempt session another date/time.    Kathee Delton Mobility Specialist 12/04/20, 12:07 PM

## 2020-12-04 NOTE — Progress Notes (Signed)
eLink Physician-Brief Progress Note Patient Name: [name] DOB: 1940-09-26 MRN: [id number]   Date of Service  12/04/2020  HPI/Events of Note  Brief HPI: Transferred from floor to ICU for worsening encephalopathy.  80 y.o. female  Hospital stay day 2, 2 Days Post-Op strangulated right femoral hernia repair with mesh, small bowel resection. Patient was last seen well at 10:30 AM per the bedside nurse.  She was unresponsive and a CODE BLUE was initially called, but she was breathing and had a pulse.   Neurology notes reviewed.  CT head shows no no evidence of acute intracranial hemorrhage. A small acute cortical infarct is questioned within the right parietal lobe. There are advanced chronic small vessel ischemic changes within the cerebral white matter and generalized parenchymal atrophy. 4. CTA of neck: The common carotid, internal carotid and vertebral arteries are patent within the neck without hemodynamically significant stenosis (50% or greater).  5. CTA of head:  No intracranial large vessel occlusion identified. Atherosclerotic irregularity of the M2 and more distal middle cerebral artery vessels bilaterally. Most notably, there are sites of up to severe stenosis within proximal right M2 MCA vessels. 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 6. Out of the IV thrombolysis time window.  7. Not a thrombectomy candidate due to no LVO on CTA.    Data: Reviewed 7.43/38/77/25. Glucose 128, wbc 11.6K IMPRESSION: MRI HEAD IMPRESSION:  1. 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 No associated hemorrhage or mass effect. 2. Underlying moderately advanced chronic microvascular ischemic disease.  MRA HEAD IMPRESSION:  1. Negative intracranial MRA for large vessel occlusion. No hemodynamically significant or correctable stenosis. 2. Distal small vessel  atheromatous irregularity, better evaluated on prior CTA. 3. 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  Camera: VS stable, SBP 165, sats 99% on o2.  A/P: postoperative catatonic state (has risk factor of schizoaffective disorder) and brainstem stroke or severe metabolic encephalopathy. No seizure-like movements noted by RN, nor is there any evidence for seizure activity on neurological exam. ( as per Neuro notes). MRI: preliminary shows CVA.   eICU Interventions  - NP- Mr Truman Hayward updated about Rx plan.  - allow hypertensive range. Avoid hypotension.  - aspiration and sz precautions. Out of range for tPA. - Neurology to see in AM. -  continue current care. - neuro checks. - start statin via  NG tube( encephalopathy). Watch for fever, leukocytosis./sepsis signs.  - VTE: Lovenox. - CBG goals < 180. - DNR now. - no asa due to risk for bleeding/per neuro        Intervention Category Major Interventions: Change in mental status - evaluation and management;Other: Evaluation Type: New Patient Evaluation  Elmer Sow 12/04/2020, 9:12 PM

## 2020-12-05 DIAGNOSIS — K4030 Unilateral inguinal hernia, with obstruction, without gangrene, not specified as recurrent: Secondary | ICD-10-CM | POA: Diagnosis not present

## 2020-12-05 DIAGNOSIS — I639 Cerebral infarction, unspecified: Secondary | ICD-10-CM | POA: Diagnosis not present

## 2020-12-05 DIAGNOSIS — R4020 Unspecified coma: Secondary | ICD-10-CM | POA: Diagnosis present

## 2020-12-05 DIAGNOSIS — F418 Other specified anxiety disorders: Secondary | ICD-10-CM | POA: Diagnosis present

## 2020-12-05 DIAGNOSIS — E785 Hyperlipidemia, unspecified: Secondary | ICD-10-CM | POA: Diagnosis present

## 2020-12-05 DIAGNOSIS — E782 Mixed hyperlipidemia: Secondary | ICD-10-CM | POA: Diagnosis present

## 2020-12-05 DIAGNOSIS — G9340 Encephalopathy, unspecified: Secondary | ICD-10-CM | POA: Insufficient documentation

## 2020-12-05 LAB — BASIC METABOLIC PANEL
Anion gap: 14 (ref 5–15)
BUN: 55 mg/dL — ABNORMAL HIGH (ref 8–23)
CO2: 24 mmol/L (ref 22–32)
Calcium: 9.1 mg/dL (ref 8.9–10.3)
Chloride: 100 mmol/L (ref 98–111)
Creatinine, Ser: 0.99 mg/dL (ref 0.44–1.00)
GFR, Estimated: 57 mL/min — ABNORMAL LOW (ref >60)
Glucose, Bld: 100 mg/dL — ABNORMAL HIGH (ref 70–99)
Potassium: 3.4 mmol/L — ABNORMAL LOW (ref 3.5–5.1)
Sodium: 138 mmol/L (ref 135–145)

## 2020-12-05 LAB — CBC
HCT: 35.3 % — ABNORMAL LOW (ref 36.0–46.0)
Hemoglobin: 11.6 g/dL — ABNORMAL LOW (ref 12.0–15.0)
MCH: 29 pg (ref 26.0–34.0)
MCHC: 32.9 g/dL (ref 30.0–36.0)
MCV: 88.3 fL (ref 80.0–100.0)
Platelets: 201 10*3/uL (ref 150–400)
RBC: 4 MIL/uL (ref 3.87–5.11)
RDW: 15 % (ref 11.5–15.5)
WBC: 9.8 10*3/uL (ref 4.0–10.5)
nRBC: 0 % (ref 0.0–0.2)

## 2020-12-05 LAB — PHOSPHORUS: Phosphorus: 3.5 mg/dL (ref 2.5–4.6)

## 2020-12-05 LAB — MAGNESIUM: Magnesium: 2.2 mg/dL (ref 1.7–2.4)

## 2020-12-05 LAB — GLUCOSE, CAPILLARY: Glucose-Capillary: 103 mg/dL — ABNORMAL HIGH (ref 70–99)

## 2020-12-05 MED ORDER — ACETAMINOPHEN 650 MG RE SUPP: 650.0000 mg | Freq: Four times a day (QID) | RECTAL | Status: DC | PRN

## 2020-12-05 MED ORDER — ORAL CARE MOUTH RINSE
15.0000 mL | Freq: Two times a day (BID) | OROMUCOSAL | Status: DC
  Administered 2020-12-06 – 2020-12-13 (×15): 15 mL via OROMUCOSAL

## 2020-12-05 MED ORDER — POTASSIUM CHLORIDE 10 MEQ/100ML IV SOLN
10.0000 meq | INTRAVENOUS | Status: AC
  Administered 2020-12-05 (×2): 10 meq via INTRAVENOUS
  Filled 2020-12-05 (×3): qty 100

## 2020-12-05 MED ORDER — HYDRALAZINE HCL 20 MG/ML IJ SOLN
5.0000 mg | INTRAMUSCULAR | Status: DC | PRN
  Administered 2020-12-05 – 2020-12-11 (×9): 5 mg via INTRAVENOUS
  Filled 2020-12-05 (×9): qty 1

## 2020-12-05 MED ORDER — DEXTROSE 50 % IV SOLN
50.0000 mL | INTRAVENOUS | Status: DC | PRN
Start: 2020-12-05 — End: 2020-12-13

## 2020-12-05 MED ORDER — SODIUM CHLORIDE 0.9 % IV SOLN: INTRAVENOUS | Status: DC

## 2020-12-05 NOTE — Progress Notes (Addendum)
Received patient from ICU.  Patient is obtunded. Breathing is unlabored.  Patient is a DNR/DNI with no escalation of care.  Palliative consult is in place.  BP 198/75, saturating 99% on venturi mask.  NGT to right nare intact, taped, clamped.  RLQ abdominal honeycomb dressing CDI.  Foley catheter in place with clear, yellow urine.  SCDs on bilateral LE. Will keep comfortable.

## 2020-12-05 NOTE — Progress Notes (Signed)
See consultation note and attending attestation 9/24.  Patient remains obtunded.  She has had watershed infarcts involving bilateral cerebral hemispheres right worse than left following MCA/ACA distribution.  Patient is DNR/DNI.  Palliative care consultation has been placed.  Have asked hospitalist service to continue following medical issues and have notified surgical cross covering attending.  PCCM will sign off.  Renold Don, MD Advanced Bronchoscopy PCCM [facility]

## 2020-12-05 NOTE — Consult Note (Signed)
Medical Consultation   [name]  MBW:[id number]  DOB: 1940-12-01  DOA: 12/02/2020  PCP: Jon Billings, NP  Outpatient Specialists:     Requesting physician: -Dr. Patsey Berthold of ICU  Reason for consultation: -Acute stroke after surgery for strangulated inguinal hernia   History of Present Illness: [name] is an 80 y.o. female with a past medical history of hypertension, hyperlipidemia, prediabetes, depression with anxiety, schizoaffective disorder, anemia, skin cancer, who was admitted by general surgery team due to strangulated inguinal hernia on 9/22  After surgery, patient developed acute stroke.  We are asked to consult on this case.  Pt is POD 3 for strangulated right inguinal hernia repair with mesh and small bowel resection. Pt was initially awake and at her baseline with normal movements at 0730 yesterday after surgery, but was found to be obtunded and not arousable at about 11:40 yesterday. Code Stroke was then called. MRI of brain showed patchy acute cortical and subcortical watershed type ischemic infarcts involving the bilateral cerebral hemispheres, right worse than left. CTA is negative for LOV.  Dr. Cheral Marker of neurology is consulted. Per Dr. Patsey Berthold, palliative consult is recommended.  When I saw patient on the floor, patient is in comatose status, not arousable, not following command.  She does not move extremities on painful stimuli, slightly opening eyes on noxious stimuli.  No active respiratory distress, active cough, nausea, vomiting or diarrhea noted.  Review of Systems: Could not reviewed since patient is in comatose status  Past Medical History: Past Medical History:  Diagnosis Date   Anemia    Colon polyp    History of small bowel obstruction 9/09   likely due to adhesions- pt refused most dx and tx in hospital   History of vaginal bleeding    post menopausal- gyn eval, ? polyp   Hyperlipidemia    Osteoarthritis     knee   Schizoaffective disorder    paranoid personality- refuses treatment   Vitamin D deficiency     Past Surgical History: Past Surgical History:  Procedure Laterality Date   ANTERIOR AND POSTERIOR REPAIR N/A 04/23/2012   Procedure: ANTERIOR   REPAIR CYSTOCELE;  Surgeon: Reece Packer, MD;  Location: [facility];  Service: Urology;  Laterality: N/A;  cysto;graft 10x6   APPENDECTOMY     bladder tack  1976   BOWEL RESECTION N/A 12/02/2020   Procedure: SMALL BOWEL RESECTION;  Surgeon: Benjamine Sprague, DO;  Location: [facility];  Service: General;  Laterality: N/A;   INGUINAL HERNIA REPAIR Right 12/02/2020   Procedure: HERNIA REPAIR INGUINAL ADULT;  Surgeon: Benjamine Sprague, DO;  Location: [facility];  Service: General;  Laterality: Right;   KNEE ARTHROSCOPY     right   KNEE ARTHROSCOPY Right    LAPAROSCOPY  1970's   twisted fallopian tube   SALPINGOOPHORECTOMY Bilateral 04/23/2012   Procedure: SALPINGO OOPHORECTOMY;  Surgeon: Emily Filbert, MD;  Location: [facility];  Service: Gynecology;  Laterality: Bilateral;   TONSILLECTOMY     VAGINAL HYSTERECTOMY N/A 04/23/2012   Procedure: HYSTERECTOMY VAGINAL;  Surgeon: Emily Filbert, MD;  Location: [facility];  Service: Gynecology;  Laterality: N/A;   VAGINAL PROLAPSE REPAIR N/A 04/23/2012   Procedure: VAGINAL VAULT SUSPENSION;  Surgeon: Reece Packer, MD;  Location: [facility];  Service: Urology;  Laterality: N/A;     Allergies:   Allergies  Allergen Reactions   Penicillins Rash  TOLERATED CEFAZOLIN     Social History:  reports that she quit smoking about 38 years ago. Her smoking use included cigarettes. She has a 10.00 pack-year smoking history. She has never used smokeless tobacco. She reports that she does not drink alcohol and does not use drugs.   Family History: Family History  Problem Relation Age of Onset   Heart disease Mother    Diabetes Mother    Skin cancer Mother    Diabetes Father    Heart disease Father    Hypertension Father     Diabetes Brother    Arthritis Brother    Hypertension Brother    Hyperlipidemia Brother    Arthritis Daughter    Cancer Son    Obesity Daughter     Physical Exam: Vitals:   12/05/20 0700 12/05/20 0800 12/05/20 0900 12/05/20 1000  BP: (!) 173/64 (!) 181/57 (!) 185/62 (!) 189/68  Pulse: 88 86 79 81  Resp: (!) 28 (!) 30 (!) 26 (!) 26  Temp:      TempSrc:      SpO2: 96% 94% 96% 96%  Weight:      Height:         Physical Exam:   Vitals:   12/05/20 0700 12/05/20 0800 12/05/20 0900 12/05/20 1000  BP: (!) 173/64 (!) 181/57 (!) 185/62 (!) 189/68  Pulse: 88 86 79 81  Resp: (!) 28 (!) 30 (!) 26 (!) 26  Temp:      TempSrc:      SpO2: 96% 94% 96% 96%  Weight:      Height:        General: Not in acute distress HEENT: no scleral icterus, No JVD or bruit Cardiac: S1/S2, RRR, No murmurs, gallops or rubs Pulm:  No rales, wheezing, rhonchi or rubs. Abd: Soft, nondistended, nontender,  no organomegaly, BS present Ext: No edema. 1+DP/PT pulse bilaterally Musculoskeletal: No joint deformities, erythema, or stiffness, ROM full Skin: No rashes.  Neuro: Patient is in comatose status, not arousable, not following command.  Not moving extremities on painful stimuli.  Slightly opening eyes on noxious stimuli. Psych: Patient is not psychotic     Data reviewed:  I have personally reviewed following labs and imaging studies Labs:  CBC: Recent Labs  Lab 12/02/20 1232 12/03/20 0558 12/04/20 0535 12/05/20 0636  WBC 16.0* 14.4* 11.6* 9.8  HGB 14.6 12.1 10.4* 11.6*  HCT 41.4 34.4* 29.8* 35.3*  MCV 86.8 86.0 87.4 88.3  PLT 263 216 184 627    Basic Metabolic Panel: Recent Labs  Lab 12/02/20 1232 12/03/20 0558 12/04/20 0535 12/05/20 0636  NA 132* 134* 134* 138  K 4.4 3.6 3.4* 3.4*  CL 98 97* 96* 100  CO2 21* 23 25 24   GLUCOSE 152* 155* 128* 100*  BUN 31* 35* 48* 55*  CREATININE 0.76 0.60 1.31* 0.99  CALCIUM 9.9 9.0 8.6* 9.1  MG  --   --   --  2.2  PHOS  --   --   --   3.5   GFR Estimated Creatinine Clearance: 43.7 mL/min (by C-G formula based on SCr of 0.99 mg/dL). Liver Function Tests: Recent Labs  Lab 12/02/20 1232  AST 33  ALT 16  ALKPHOS 57  BILITOT 2.1*  PROT 8.1  ALBUMIN 4.8   Recent Labs  Lab 12/02/20 1232  LIPASE 25   No results for input(s): AMMONIA in the last 168 hours. Coagulation profile No results for input(s): INR, PROTIME in the last 168 hours.  Cardiac Enzymes: No results for input(s): CKTOTAL, CKMB, CKMBINDEX, TROPONINI in the last 168 hours. BNP: Invalid input(s): POCBNP CBG: Recent Labs  Lab 12/04/20 1204 12/04/20 2112 12/04/20 2337 12/05/20 0804  GLUCAP 99 87 86 103*   D-Dimer No results for input(s): DDIMER in the last 72 hours. Hgb A1c No results for input(s): HGBA1C in the last 72 hours. Lipid Profile No results for input(s): CHOL, HDL, LDLCALC, TRIG, CHOLHDL, LDLDIRECT in the last 72 hours. Thyroid function studies No results for input(s): TSH, T4TOTAL, T3FREE, THYROIDAB in the last 72 hours.  Invalid input(s): FREET3 Anemia work up No results for input(s): VITAMINB12, FOLATE, FERRITIN, TIBC, IRON, RETICCTPCT in the last 72 hours. Urinalysis    Component Value Date/Time   COLORURINE AMBER BIOCHEMICALS MAY BE AFFECTED BY COLOR (A) 06/25/2008 1242   APPEARANCEUR Cloudy (A) 08/31/2020 1039   LABSPEC 1.023 06/25/2008 1242   PHURINE 6.0 06/25/2008 1242   GLUCOSEU Negative 08/31/2020 1039   HGBUR NEGATIVE 06/25/2008 1242   BILIRUBINUR Negative 08/31/2020 1039   KETONESUR >80 (A) 06/25/2008 1242   PROTEINUR 1+ (A) 08/31/2020 1039   PROTEINUR NEGATIVE 06/25/2008 1242   UROBILINOGEN 0.2 06/25/2008 1242   NITRITE Positive (A) 08/31/2020 1039   NITRITE NEGATIVE 06/25/2008 1242   LEUKOCYTESUR 2+ (A) 08/31/2020 1039     Microbiology Recent Results (from the past 240 hour(s))  Resp Panel by RT-PCR (Flu A&B, Covid) Nasopharyngeal Swab     Status: None   Collection Time: 12/02/20  4:39 PM    Specimen: Nasopharyngeal Swab; Nasopharyngeal(NP) swabs in vial transport medium  Result Value Ref Range Status   SARS Coronavirus 2 by RT PCR NEGATIVE NEGATIVE Final    Comment: (NOTE) SARS-CoV-2 target nucleic acids are NOT DETECTED.  The SARS-CoV-2 RNA is generally detectable in upper respiratory specimens during the acute phase of infection. The lowest concentration of SARS-CoV-2 viral copies this assay can detect is 138 copies/mL. A negative result does not preclude SARS-Cov-2 infection and should not be used as the sole basis for treatment or other patient management decisions. A negative result may occur with  improper specimen collection/handling, submission of specimen other than nasopharyngeal swab, presence of viral mutation(s) within the areas targeted by this assay, and inadequate number of viral copies(<138 copies/mL). A negative result must be combined with clinical observations, patient history, and epidemiological information. The expected result is Negative.  Fact Sheet for Patients:  EntrepreneurPulse.com.au  Fact Sheet for Healthcare Providers:  IncredibleEmployment.be  This test is no t yet approved or cleared by the [place] FDA and  has been authorized for detection and/or diagnosis of SARS-CoV-2 by FDA under an Emergency Use Authorization (EUA). This EUA will remain  in effect (meaning this test can be used) for the duration of the COVID-19 declaration under Section 564(b)(1) of the Act, 21 U.S.C.section 360bbb-3(b)(1), unless the authorization is terminated  or revoked sooner.       Influenza A by PCR NEGATIVE NEGATIVE Final   Influenza B by PCR NEGATIVE NEGATIVE Final    Comment: (NOTE) The Xpert Xpress SARS-CoV-2/FLU/RSV plus assay is intended as an aid in the diagnosis of influenza from Nasopharyngeal swab specimens and should not be used as a sole basis for treatment. Nasal washings and aspirates are  unacceptable for Xpert Xpress SARS-CoV-2/FLU/RSV testing.  Fact Sheet for Patients: EntrepreneurPulse.com.au  Fact Sheet for Healthcare Providers: IncredibleEmployment.be  This test is not yet approved or cleared by the [place] FDA and has been authorized for detection and/or diagnosis of  SARS-CoV-2 by FDA under an Emergency Use Authorization (EUA). This EUA will remain in effect (meaning this test can be used) for the duration of the COVID-19 declaration under Section 564(b)(1) of the Act, 21 U.S.C. section 360bbb-3(b)(1), unless the authorization is terminated or revoked.  Performed at [facility], [address] [place], [place] [address]   MRSA Next Gen by PCR, Nasal     Status: None   Collection Time: 12/04/20  9:05 PM   Specimen: Nasal Mucosa; Nasal Swab  Result Value Ref Range Status   MRSA by PCR Next Gen NOT DETECTED NOT DETECTED Final    Comment: (NOTE) The GeneXpert MRSA Assay (FDA approved for NASAL specimens only), is one component of a comprehensive MRSA colonization surveillance program. It is not intended to diagnose MRSA infection nor to guide or monitor treatment for MRSA infections. Test performance is not FDA approved in patients less than 27 years old. Performed at [facility], [address] [place], [place] [address]        Inpatient Medications:   Scheduled Meds:  amLODipine  5 mg Oral QHS   atorvastatin  40 mg Per Tube Daily   carvedilol  6.25 mg Oral BID WC   Chlorhexidine Gluconate Cloth  6 each Topical Daily   enoxaparin (LOVENOX) injection  40 mg Subcutaneous Q24H   LORazepam  2 mg Intramuscular Once   [START ON 12/06/2020] mouth rinse  15 mL Mouth Rinse BID   pantoprazole (PROTONIX) IV  40 mg Intravenous QHS   sertraline  25 mg Oral Daily   triamcinolone cream  1 application Topical BID   Continuous Infusions:  sodium chloride 10 mL/hr at 12/05/20 0400   sodium chloride      piperacillin-tazobactam (ZOSYN)  IV 3.375 g (12/05/20 0503)   potassium chloride       Radiological Exams on Admission: CT ANGIO HEAD NECK W WO CM  Result Date: 12/04/2020 CLINICAL DATA:  Neuro deficit, acute, stroke suspected. Unresponsive. EXAM: CT ANGIOGRAPHY HEAD AND NECK TECHNIQUE: Multidetector CT imaging of the head and neck was performed using the standard protocol during bolus administration of intravenous contrast. Multiplanar CT image reconstructions and MIPs were obtained to evaluate the vascular anatomy. Carotid stenosis measurements (when applicable) are obtained utilizing NASCET criteria, using the distal internal carotid diameter as the denominator. CONTRAST:  33mL OMNIPAQUE IOHEXOL 350 MG/ML SOLN COMPARISON:  No pertinent prior exams available for comparison. FINDINGS: CT HEAD FINDINGS Brain: Mild generalized cerebral atrophy. A subtle acute cortical infarct is questioned within the right parietal lobe (for instance as seen on series 3, image 27). Background advanced patchy and ill-defined hypoattenuation within the cerebral white matter, nonspecific but compatible with chronic small vessel ischemic disease. There is no acute intracranial hemorrhage. No extra-axial fluid collection. No evidence of an intracranial mass. No midline shift. Vascular: No hyperdense vessel.  Atherosclerotic calcifications. Skull: Normal. Negative for fracture or focal lesion. Sinuses: No significant paranasal sinus disease. Orbits: No mass or acute finding. Review of the MIP images confirms the above findings These results were called by telephone at the time of interpretation on 12/04/2020 at 12:55 pm to provider ERIC [facility] , who verbally acknowledged these results. CTA NECK FINDINGS Aortic arch: Standard aortic branching. Atherosclerotic plaque within the visualized aortic arch and proximal major branch vessels of the neck. No hemodynamically significant innominate or proximal subclavian artery stenosis.  Right carotid system: CCA and ICA patent within the neck without hemodynamically significant stenosis (50% or greater). Mild-to-moderate atherosclerotic  plaque within the carotid bifurcation and proximal ICA. Left carotid system: CCA and ICA patent within the neck without hemodynamically significant stenosis (50% or greater). Moderate atherosclerotic plaque within the carotid bifurcation and proximal ICA. Vertebral arteries: Vertebral arteries patent within the neck bilaterally. Mild atherosclerotic narrowing at the origin of the right vertebral artery. Skeleton: Cervical spondylosis. No acute bony abnormality or aggressive osseous lesion. Other neck: No neck mass or cervical lymphadenopathy. Upper chest: Patchy opacity within the imaged left lower lobe pneumonia or atelectasis. Review of the MIP images confirms the above findings CTA HEAD FINDINGS Anterior circulation: The intracranial internal carotid arteries are patent. Atherosclerotic plaque within both vessels with up to moderate stenosis. The M1 middle cerebral arteries are patent. Significant atherosclerotic irregularity of the M2 and more distal middle cerebral artery vessels bilaterally. Most notably, there are sites of up to severe stenosis within the proximal M2 right MCA vessels. No proximal M2 branch occlusion is identified. The anterior cerebral arteries are patent. 2 mm inferiorly projecting vascular protrusion arising from the supraclinoid left ICA, likely reflecting an aneurysm. Posterior circulation: The intracranial vertebral arteries are patent. The basilar artery is patent. The posterior cerebral arteries are patent. Hypoplastic right P1 segment with sizable right posterior communicating artery. The left posterior communicating artery is hypoplastic or absent Venous sinuses: Within the limitations of contrast timing, no convincing thrombus. Anatomic variants: As described Review of the MIP images confirms the above findings No intracranial  large vessel occlusion identified. These results were called by telephone at the time of interpretation on 12/04/2020 at 12:56 pm to provider ERIC [facility] , who verbally acknowledged these results. IMPRESSION: CT head: 1. No evidence of acute intracranial hemorrhage. 2. A small acute cortical infarct is questioned within the right parietal lobe. 3. Advanced chronic small vessel ischemic changes within the cerebral white matter. 4. Generalized parenchymal atrophy. CTA neck: 1. The common carotid, internal carotid and vertebral arteries are patent within the neck without hemodynamically significant stenosis (50% or greater). 2. Opacity within the imaged left lower lobe, which may reflect atelectasis or pneumonia. CTA Head: 1. No intracranial large vessel occlusion identified. 2. Atherosclerotic irregularity of the M2 and more distal middle cerebral artery vessels bilaterally. Most notably, there are sites of up to severe stenosis within proximal right M2 MCA vessels. 3. 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 Electronically Signed   By: Kellie Simmering D.O.   On: 12/04/2020 13:07   MR ANGIO HEAD WO CONTRAST  Result Date: 12/04/2020 CLINICAL DATA:  Initial evaluation for neuro deficit, stroke suspected. EXAM: MRI HEAD WITHOUT CONTRAST MRA HEAD WITHOUT CONTRAST TECHNIQUE: Multiplanar, multi-echo pulse sequences of the brain and surrounding structures were acquired without intravenous contrast. Angiographic images of the Circle of Willis were acquired using MRA technique without intravenous contrast. COMPARISON:  CTA from earlier the same day. FINDINGS: MRI HEAD FINDINGS Brain: Cerebral volume within normal limits for age. Patchy T2/FLAIR hyperintensity involving the periventricular deep white matter both cerebral hemispheres as well as the pons, most consistent with chronic small vessel ischemic disease, moderate in nature. Scattered areas of restricted diffusion involving the cortical and  subcortical aspect of the right frontal, parietal, and occipital lobes, consistent with acute ischemic infarcts. These are positioned in a linear watershed distribution. There are similar but less pronounced watershed type infarcts involving the contralateral left cerebral hemisphere, with involvement of the left frontal and parietal lobes. No associated hemorrhage or mass effect. No other evidence for acute or chronic intracranial  hemorrhage. No mass lesion, midline shift or mass effect. No hydrocephalus or extra-axial fluid collection. Pituitary gland suprasellar region normal. Midline structures intact. Vascular: Major intracranial vascular flow voids are maintained. Skull and upper cervical spine: Prominent osteoarthritic changes with degenerative thickening noted about the C1-2 articulation and tectorial membrane. No significant stenosis at the craniocervical junction. Bone marrow signal intensity within normal limits. No scalp soft tissue abnormality. Sinuses/Orbits: Globes and orbital soft tissues within normal limits. Paranasal sinuses are clear. No mastoid effusion. Inner ear structures grossly normal. Other: None. MRA HEAD FINDINGS Anterior circulation: Examination degraded by motion artifact. Visualized distal cervical segments of the internal carotid arteries are patent with antegrade flow. Petrous, cavernous, and supraclinoid segments patent without hemodynamically significant stenosis. 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 A1 segments patent. Normal anterior communicating artery complex. Anterior cerebral arteries patent without significant stenosis. No M1 stenosis or occlusion. Normal MCA bifurcations. Distal MCA branches perfused and symmetric. Distal small vessel atheromatous irregularity, better evaluated on prior CTA. Posterior circulation: Vertebral arteries widely patent to the vertebrobasilar junction. Right vertebral artery  dominant. Both PICA origins patent and normal. Basilar patent to its distal aspect without stenosis. Superior cerebellar arteries patent bilaterally. Left PCA supplied via the basilar. Right PCA supplied via a hypoplastic right P1 segment and robust right posterior communicating artery. Both PCAs remain patent to their distal aspects without appreciable stenosis. Distal small vessel atheromatous irregularity. Anatomic variants: Hypoplastic right P1 segment and robust right posterior communicating artery. IMPRESSION: MRI HEAD IMPRESSION: 1. 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 No associated hemorrhage or mass effect. 2. Underlying moderately advanced chronic microvascular ischemic disease. MRA HEAD IMPRESSION: 1. Negative intracranial MRA for large vessel occlusion. No hemodynamically significant or correctable stenosis. 2. Distal small vessel atheromatous irregularity, better evaluated on prior CTA. 3. 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 Electronically Signed   By: Jeannine Boga M.D.   On: 12/04/2020 21:22   MR BRAIN WO CONTRAST  Result Date: 12/04/2020 CLINICAL DATA:  Initial evaluation for neuro deficit, stroke suspected. EXAM: MRI HEAD WITHOUT CONTRAST MRA HEAD WITHOUT CONTRAST TECHNIQUE: Multiplanar, multi-echo pulse sequences of the brain and surrounding structures were acquired without intravenous contrast. Angiographic images of the Circle of Willis were acquired using MRA technique without intravenous contrast. COMPARISON:  CTA from earlier the same day. FINDINGS: MRI HEAD FINDINGS Brain: Cerebral volume within normal limits for age. Patchy T2/FLAIR hyperintensity involving the periventricular deep white matter both cerebral hemispheres as well as the pons, most consistent with chronic small vessel ischemic disease, moderate in nature.  Scattered areas of restricted diffusion involving the cortical and subcortical aspect of the right frontal, parietal, and occipital lobes, consistent with acute ischemic infarcts. These are positioned in a linear watershed distribution. There are similar but less pronounced watershed type infarcts involving the contralateral left cerebral hemisphere, with involvement of the left frontal and parietal lobes. No associated hemorrhage or mass effect. No other evidence for acute or chronic intracranial hemorrhage. No mass lesion, midline shift or mass effect. No hydrocephalus or extra-axial fluid collection. Pituitary gland suprasellar region normal. Midline structures intact. Vascular: Major intracranial vascular flow voids are maintained. Skull and upper cervical spine: Prominent osteoarthritic changes with degenerative thickening noted about the C1-2 articulation and tectorial membrane. No significant stenosis at the craniocervical junction. Bone marrow signal  intensity within normal limits. No scalp soft tissue abnormality. Sinuses/Orbits: Globes and orbital soft tissues within normal limits. Paranasal sinuses are clear. No mastoid effusion. Inner ear structures grossly normal. Other: None. MRA HEAD FINDINGS Anterior circulation: Examination degraded by motion artifact. Visualized distal cervical segments of the internal carotid arteries are patent with antegrade flow. Petrous, cavernous, and supraclinoid segments patent without hemodynamically significant stenosis. 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 A1 segments patent. Normal anterior communicating artery complex. Anterior cerebral arteries patent without significant stenosis. No M1 stenosis or occlusion. Normal MCA bifurcations. Distal MCA branches perfused and symmetric. Distal small vessel atheromatous irregularity, better evaluated on prior CTA. Posterior circulation: Vertebral arteries widely  patent to the vertebrobasilar junction. Right vertebral artery dominant. Both PICA origins patent and normal. Basilar patent to its distal aspect without stenosis. Superior cerebellar arteries patent bilaterally. Left PCA supplied via the basilar. Right PCA supplied via a hypoplastic right P1 segment and robust right posterior communicating artery. Both PCAs remain patent to their distal aspects without appreciable stenosis. Distal small vessel atheromatous irregularity. Anatomic variants: Hypoplastic right P1 segment and robust right posterior communicating artery. IMPRESSION: MRI HEAD IMPRESSION: 1. 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 No associated hemorrhage or mass effect. 2. Underlying moderately advanced chronic microvascular ischemic disease. MRA HEAD IMPRESSION: 1. Negative intracranial MRA for large vessel occlusion. No hemodynamically significant or correctable stenosis. 2. Distal small vessel atheromatous irregularity, better evaluated on prior CTA. 3. 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 Electronically Signed   By: Jeannine Boga M.D.   On: 12/04/2020 21:22   DG Chest Port 1 View  Result Date: 12/04/2020 CLINICAL DATA:  Dyspnea EXAM: PORTABLE CHEST 1 VIEW COMPARISON:  None. Findings are correlated with CT examination of the abdomen pelvis of 12/02/2020 FINDINGS: Lung volumes are small. No pneumothorax or pleural effusion. Right paratracheal soft tissue thickening may reflect vascular shadow on this rotated examination, however, a paramediastinal soft tissue mass or adenopathy could appear similarly. This is not well characterized on this examination. Cardiac size is within normal limits. Opacity within the right cardiophrenic angle may relate to the patient's known large hiatal hernia. Nasogastric tube appears looped within the inferior  thorax, likely within the known hiatal hernia. IMPRESSION: Pulmonary hypoinflation. Nasogastric tube looped within the inferior thorax, likely within known large hiatal hernia. Right paratracheal soft tissue opacity, not well characterized, possibly artifactual and related to rotation. This would be better assessed with a standard two view chest radiograph or CT examination when the patient is clinically able to do so. Electronically Signed   By: Fidela Salisbury M.D.   On: 12/04/2020 21:41    Impression/Recommendations Principal Problem:   Strangulated inguinal hernia Active Problems:   Anemia   Hypertension   Stroke ([place])   HLD (hyperlipidemia)   Depression with anxiety    Strangulated inguinal hernia: 3 Days Post-Op strangulated right femoral hernia repair with mesh, small bowel resection -managed by primary surgery team -pt is on IV zosyn.  -prn zofran -IVF: NS 75 cc/h  Stroke and coma: pt has watershed infarcts involving bilateral cerebral hemispheres right worse than left. Her prognosis is very poor.  Palliative care is recommended.  I have discussed with patient's daughter on the phone, who is very supportive and has agreed for palliative consult.  Dr. Cheral Marker of  neurology is on board. Per Dr. Cheral Marker, "given that her acute strokes are due to hypotension rather than clot formation, starting ASA would not be high priority, especially given her recent major surgery".  Patient does not have feeding tube, cannot take oral medications now  -Per [place], will keep her SBP above 130 and less than 180. Will give prn IV hydralazine -Palliative consult -IV fluid: Normal saline 75 cc/h -Check blood sugar level every 6 hours -As needed D50 for hypoglycemia -Continue to check  Anemia-normocytic anemia: Hemoglobin 11.6 -Follow-up with CBC  Hypertension -IV hydralazine as needed as above  HLD (hyperlipidemia) -Lipitor when able to take  Depression with anxiety -Continue home medications  when able to take oral meds   Thank you for this consultation.  Our [facility] hospitalist team will follow the patient with you.   Time Spent:  30 min  Ivor Costa M.D. [facility] 12/05/2020, 10:18 AM

## 2020-12-05 NOTE — Progress Notes (Signed)
Subjective:  CC: [name] is a 80 y.o. female  Hospital stay day 3, 3 Days Post-Op strangulated right femoral hernia repair with mesh, small bowel resection.  HPI: [name], the patient suffered a significant stroke.  She was initially moved to the intensive care unit for closer monitoring, however she has been protecting her airway and has been hemodynamically stable.  She was transferred out to the stroke unit.  Today, she has remained altered, but did mouth the word "ouch" to sternal rub.  She does have extensor posturing bilaterally to noxious stimuli.  At the time of my evaluation, neurology is at the bedside discussing the patient with her daughter, [name].  ROS:  Unable to obtain secondary to altered mental status   Objective:   Temp:  [97.9 F (36.6 C)-98.9 F (37.2 C)] 97.9 F (36.6 C) (09/25 1132) Pulse Rate:  [66-88] 81 (09/25 1132) Resp:  [20-35] 25 (09/25 1132) BP: (133-198)/(40-75) 198/75 (09/25 1132) SpO2:  [94 %-100 %] 99 % (09/25 1132) FiO2 (%):  [30 %-35 %] 30 % (09/25 0808) Weight:  [76.7 kg] 76.7 kg (09/24 2111)     Height: 5\' 3"  (160 cm) Weight: 76.7 kg BMI (Calculated): 29.96   Intake/Output this shift:   Intake/Output Summary (Last 24 hours) at 12/05/2020 1342 Last data filed at 12/05/2020 1145 Gross per 24 hour  Intake 818.19 ml  Output 500 ml  Net 318.19 ml     Constitutional :  Markedly altered.  Minimally responsive to painful stimuli  Respiratory:  clear to auscultation bilaterally  Cardiovascular:  regular rate and rhythm  Gastrointestinal:  Small bruising around incision site in right groin, but remains soft, staples c/d/I.  NG with clear  Skin: Cool and moist.   Psychiatric: Unable to assess secondary to altered mental status       LABS:  CMP Latest Ref Rng & Units 12/05/2020 12/04/2020 12/03/2020  Glucose 70 - 99 mg/dL 100(H) 128(H) 155(H)  BUN 8 - 23 mg/dL 55(H) 48(H) 35(H)  Creatinine 0.44 - 1.00 mg/dL 0.99 1.31(H) 0.60   Sodium 135 - 145 mmol/L 138 134(L) 134(L)  Potassium 3.5 - 5.1 mmol/L 3.4(L) 3.4(L) 3.6  Chloride 98 - 111 mmol/L 100 96(L) 97(L)  CO2 22 - 32 mmol/L 24 25 23   Calcium 8.9 - 10.3 mg/dL 9.1 8.6(L) 9.0  Total Protein 6.5 - 8.1 g/dL - - -  Total Bilirubin 0.3 - 1.2 mg/dL - - -  Alkaline Phos 38 - 126 U/L - - -  AST 15 - 41 U/L - - -  ALT 0 - 44 U/L - - -   CBC Latest Ref Rng & Units 12/05/2020 12/04/2020 12/03/2020  WBC 4.0 - 10.5 K/uL 9.8 11.6(H) 14.4(H)  Hemoglobin 12.0 - 15.0 g/dL 11.6(L) 10.4(L) 12.1  Hematocrit 36.0 - 46.0 % 35.3(L) 29.8(L) 34.4(L)  Platelets 150 - 400 K/uL 201 184 216    RADS: CLINICAL DATA:  Neuro deficit, acute, stroke suspected. Unresponsive.   EXAM: CT ANGIOGRAPHY HEAD AND NECK   TECHNIQUE: Multidetector CT imaging of the head and neck was performed using the standard protocol during bolus administration of intravenous contrast. Multiplanar CT image reconstructions and MIPs were obtained to evaluate the vascular anatomy. Carotid stenosis measurements (when applicable) are obtained utilizing NASCET criteria, using the distal internal carotid diameter as the denominator.   CONTRAST:  80mL OMNIPAQUE IOHEXOL 350 MG/ML SOLN   COMPARISON:  No pertinent prior exams available for comparison.   FINDINGS: CT HEAD FINDINGS   Brain:  Mild generalized cerebral atrophy.   A subtle acute cortical infarct is questioned within the right parietal lobe (for instance as seen on series 3, image 27).   Background advanced patchy and ill-defined hypoattenuation within the cerebral white matter, nonspecific but compatible with chronic small vessel ischemic disease.   There is no acute intracranial hemorrhage.   No extra-axial fluid collection.   No evidence of an intracranial mass.   No midline shift.   Vascular: No hyperdense vessel.  Atherosclerotic calcifications.   Skull: Normal. Negative for fracture or focal lesion.   Sinuses: No significant  paranasal sinus disease.   Orbits: No mass or acute finding.   Review of the MIP images confirms the above findings   These results were called by telephone at the time of interpretation on 12/04/2020 at 12:55 pm to provider ERIC [facility] , who verbally acknowledged these results.   CTA NECK FINDINGS   Aortic arch: Standard aortic branching. Atherosclerotic plaque within the visualized aortic arch and proximal major branch vessels of the neck. No hemodynamically significant innominate or proximal subclavian artery stenosis.   Right carotid system: CCA and ICA patent within the neck without hemodynamically significant stenosis (50% or greater). Mild-to-moderate atherosclerotic plaque within the carotid bifurcation and proximal ICA.   Left carotid system: CCA and ICA patent within the neck without hemodynamically significant stenosis (50% or greater). Moderate atherosclerotic plaque within the carotid bifurcation and proximal ICA.   Vertebral arteries: Vertebral arteries patent within the neck bilaterally. Mild atherosclerotic narrowing at the origin of the right vertebral artery.   Skeleton: Cervical spondylosis. No acute bony abnormality or aggressive osseous lesion.   Other neck: No neck mass or cervical lymphadenopathy.   Upper chest: Patchy opacity within the imaged left lower lobe pneumonia or atelectasis.   Review of the MIP images confirms the above findings   CTA HEAD FINDINGS   Anterior circulation:   The intracranial internal carotid arteries are patent. Atherosclerotic plaque within both vessels with up to moderate stenosis. The M1 middle cerebral arteries are patent. Significant atherosclerotic irregularity of the M2 and more distal middle cerebral artery vessels bilaterally. Most notably, there are sites of up to severe stenosis within the proximal M2 right MCA vessels. No proximal M2 branch occlusion is identified. The anterior cerebral arteries are  patent. 2 mm inferiorly projecting vascular protrusion arising from the supraclinoid left ICA, likely reflecting an aneurysm.   Posterior circulation:   The intracranial vertebral arteries are patent. The basilar artery is patent. The posterior cerebral arteries are patent. Hypoplastic right P1 segment with sizable right posterior communicating artery. The left posterior communicating artery is hypoplastic or absent   Venous sinuses: Within the limitations of contrast timing, no convincing thrombus.   Anatomic variants: As described   Review of the MIP images confirms the above findings   No intracranial large vessel occlusion identified. These results were called by telephone at the time of interpretation on 12/04/2020 at 12:56 pm to provider ERIC [facility] , who verbally acknowledged these results.   IMPRESSION: CT head:   1. No evidence of acute intracranial hemorrhage. 2. A small acute cortical infarct is questioned within the right parietal lobe. 3. Advanced chronic small vessel ischemic changes within the cerebral white matter. 4. Generalized parenchymal atrophy.   CTA neck:   1. The common carotid, internal carotid and vertebral arteries are patent within the neck without hemodynamically significant stenosis (50% or greater). 2. Opacity within the imaged left lower lobe, which may reflect atelectasis  or pneumonia.   CTA Head:   1. No intracranial large vessel occlusion identified. 2. Atherosclerotic irregularity of the M2 and more distal middle cerebral artery vessels bilaterally. Most notably, there are sites of up to severe stenosis within proximal right M2 MCA vessels. 3. 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  Labs: Results for ARISA, [place] "MARGARET" (MRN [id number]) as of 12/04/2020 13:25  Ref. Range 12/04/2020 05:35  Sodium Latest Ref Range: 135 - 145 mmol/L 134 (L)  Potassium Latest Ref Range: 3.5 - 5.1 mmol/L 3.4 (L)   Chloride Latest Ref Range: 98 - 111 mmol/L 96 (L)  CO2 Latest Ref Range: 22 - 32 mmol/L 25  Glucose Latest Ref Range: 70 - 99 mg/dL 128 (H)  BUN Latest Ref Range: 8 - 23 mg/dL 48 (H)  Creatinine Latest Ref Range: 0.44 - 1.00 mg/dL 1.31 (H)  Calcium Latest Ref Range: 8.9 - 10.3 mg/dL 8.6 (L)  Anion gap Latest Ref Range: 5 - 15  13  GFR, Estimated Latest Ref Range: >60 mL/min 41 (L)  WBC Latest Ref Range: 4.0 - 10.5 K/uL 11.6 (H)  RBC Latest Ref Range: 3.87 - 5.11 MIL/uL 3.41 (L)  Hemoglobin Latest Ref Range: 12.0 - 15.0 g/dL 10.4 (L)  HCT Latest Ref Range: 36.0 - 46.0 % 29.8 (L)  MCV Latest Ref Range: 80.0 - 100.0 fL 87.4  MCH Latest Ref Range: 26.0 - 34.0 pg 30.5  MCHC Latest Ref Range: 30.0 - 36.0 g/dL 34.9  RDW Latest Ref Range: 11.5 - 15.5 % 15.1  Platelets Latest Ref Range: 150 - 400 K/uL 184  nRBC Latest Ref Range: 0.0 - 0.2 % 0.0   Assessment:   S/p strangulated right femoral hernia repair with mesh, small bowel resection.  Suffered a large stroke yesterday and is currently obtunded.  No concerns from a surgical perspective, however her overall prognosis is guarded.  Neurology has been discussing this with the patient's daughter.  Primary responsibility for this patient will be transferred to the hospital medicine team, with general surgery continuing to follow the surgical issues.

## 2020-12-05 NOTE — Progress Notes (Signed)
OT Cancellation Note  Patient Details Name: [name] MRN: [id number] DOB: 09-22-1940   Cancelled Treatment:    Reason Eval/Treat Not Completed: Medical issues which prohibited therapy. Thank you for the OT consult. Order received and chart reviewed. Per chart review, pt noted to have had rapid response (CODE STROKE) on 9/24, and has subsequently transitioned to higher level care. Per therapy protocols, will require new orders to initiate therapy services. OT to sign off at this time. Please re-consult when pt medically appropriate.    Fredirick Maudlin, OTR/L [place]

## 2020-12-05 NOTE — Progress Notes (Signed)
Subjective: Continues to be obtunded.  Objective: Current vital signs: BP (!) 189/68 (BP Location: Right Arm)   Pulse 81   Temp 98.1 F (36.7 C) (Oral)   Resp (!) 26   Ht 5\' 3"  (1.6 m)   Wt 76.7 kg   SpO2 96%   BMI 29.95 kg/m  Vital signs in last 24 hours: Temp:  [97.9 F (36.6 C)-98.8 F (37.1 C)] 98.1 F (36.7 C) (09/24 2315) Pulse Rate:  [66-88] 81 (09/25 1000) Resp:  [20-35] 26 (09/25 1000) BP: (133-193)/(40-73) 189/68 (09/25 1000) SpO2:  [94 %-100 %] 96 % (09/25 1000) FiO2 (%):  [30 %-35 %] 30 % (09/25 0808) Weight:  [76.7 kg] 76.7 kg (09/24 2111)  Intake/Output from previous day: 09/24 0701 - 09/25 0700 In: 719 [I.V.:478.6; NG/GT:25; IV Piggyback:215.4] Out: 500 [Urine:500] Intake/Output this shift: No intake/output data recorded. Nutritional status:  Diet Order             Diet NPO time specified  Diet effective now                  HEENT: [place]/AT Lungs: On NRB mask. Unlabored breathing.  Ext: No edema  Neurological Examination Mental Status: Eyes closed. Obtunded. Does not open eyes to verbal or noxious stimuli, but will say "ow" and move RUE semipurposefully.  Cranial Nerves: II: PERRL 3 mm >> 2 mm. No blink to threat bilaterally   III,IV, VI:  Eyes are conjugate without right or leftward gaze preference..   V: No reactions to light tactile stimuli VII: Lips are in a roughtly "O" shape. No grimace to any noxious stimuli. Face in this context is symmetric.  VIII: No responses to any verbal stimuli IX,X: Unable to assess XI: Head is midline.  XII: Tongue is observed to have decreased dyskinetic to-and-fro movements relative to yesterday Motor/Sensory: BUE: Extensor posturing now resolved, but still with increased triceps tone bilaterally. Moves RUE to sternal rub but not LUE.  BLE: Withdraw with hip and knee flexion to noxious plantar stimulation but not consistently and movements are low amplitude. Increased flexor tone at the knees bilaterally.   Deep Tendon Reflexes: Unchanged. Toes are upgoing bilaterally  Cerebellar/Gait: Unable to assess   Lab Results: Results for orders placed or performed during the hospital encounter of 12/02/20 (from the past 48 hour(s))  CBC     Status: Abnormal   Collection Time: 12/04/20  5:35 AM  Result Value Ref Range   WBC 11.6 (H) 4.0 - 10.5 K/uL   RBC 3.41 (L) 3.87 - 5.11 MIL/uL   Hemoglobin 10.4 (L) 12.0 - 15.0 g/dL   HCT 29.8 (L) 36.0 - 46.0 %   MCV 87.4 80.0 - 100.0 fL   MCH 30.5 26.0 - 34.0 pg   MCHC 34.9 30.0 - 36.0 g/dL   RDW 15.1 11.5 - 15.5 %   Platelets 184 150 - 400 K/uL   nRBC 0.0 0.0 - 0.2 %    Comment: Performed at [facility], [address], [place], [place] [address]  Basic metabolic panel     Status: Abnormal   Collection Time: 12/04/20  5:35 AM  Result Value Ref Range   Sodium 134 (L) 135 - 145 mmol/L   Potassium 3.4 (L) 3.5 - 5.1 mmol/L   Chloride 96 (L) 98 - 111 mmol/L   CO2 25 22 - 32 mmol/L   Glucose, Bld 128 (H) 70 - 99 mg/dL    Comment: Glucose reference range applies only to samples taken  after fasting for at least 8 hours.   BUN 48 (H) 8 - 23 mg/dL   Creatinine, Ser 1.31 (H) 0.44 - 1.00 mg/dL   Calcium 8.6 (L) 8.9 - 10.3 mg/dL   GFR, Estimated 41 (L) >60 mL/min    Comment: (NOTE) Calculated using the CKD-EPI Creatinine Equation (2021)    Anion gap 13 5 - 15    Comment: Performed at [facility], [address] [place], [place] [address]  Glucose, capillary     Status: None   Collection Time: 12/04/20 12:04 PM  Result Value Ref Range   Glucose-Capillary 99 70 - 99 mg/dL    Comment: Glucose reference range applies only to samples taken after fasting for at least 8 hours.   Comment 1 Notify RN    Comment 2 Document in Chart   Blood gas, arterial     Status: Abnormal   Collection Time: 12/04/20  4:05 PM  Result Value Ref Range   FIO2 0.28    pH, Arterial 7.43 7.350 - 7.450   pCO2 arterial 38 32.0 - 48.0 mmHg   pO2, Arterial 77 (L)  83.0 - 108.0 mmHg   Bicarbonate 25.2 20.0 - 28.0 mmol/L   Acid-Base Excess 0.9 0.0 - 2.0 mmol/L   O2 Saturation 95.6 %   Patient temperature 37.0    Collection site REVIEWED BY    Sample type ARTERIAL DRAW    Allens test (pass/fail) PASS PASS    Comment: Performed at [facility], [address] [place], [place] [address]  Blood gas, arterial     Status: None (Preliminary result)   Collection Time: 12/04/20  8:42 PM  Result Value Ref Range   FIO2 0.35    pH, Arterial 7.45 7.350 - 7.450   pCO2 arterial 37 32.0 - 48.0 mmHg   pO2, Arterial 83 83.0 - 108.0 mmHg   Bicarbonate 25.7 20.0 - 28.0 mmol/L   Acid-Base Excess 1.8 0.0 - 2.0 mmol/L   O2 Saturation 96.6 %   Patient temperature 37.0    Collection site REVIEWED BY    Sample type ARTERY     Comment: Performed at [facility], [address] [place], [place] [address]   Allens test (pass/fail) PENDING PASS   Mechanical Rate PENDING   MRSA Next Gen by PCR, Nasal     Status: None   Collection Time: 12/04/20  9:05 PM   Specimen: Nasal Mucosa; Nasal Swab  Result Value Ref Range   MRSA by PCR Next Gen NOT DETECTED NOT DETECTED    Comment: (NOTE) The GeneXpert MRSA Assay (FDA approved for NASAL specimens only), is one component of a comprehensive MRSA colonization surveillance program. It is not intended to diagnose MRSA infection nor to guide or monitor treatment for MRSA infections. Test performance is not FDA approved in patients less than 2 years old. Performed at [facility], [address] [place], [place] [address]   Glucose, capillary     Status: None   Collection Time: 12/04/20  9:12 PM  Result Value Ref Range   Glucose-Capillary 87 70 - 99 mg/dL    Comment: Glucose reference range applies only to samples taken after fasting for at least 8 hours.  Glucose, capillary     Status: None   Collection Time: 12/04/20 11:37 PM  Result Value Ref Range   Glucose-Capillary 86 70 - 99 mg/dL     Comment: Glucose reference range applies only to samples taken after fasting for at least  8 hours.  Basic metabolic panel     Status: Abnormal   Collection Time: 12/05/20  6:36 AM  Result Value Ref Range   Sodium 138 135 - 145 mmol/L   Potassium 3.4 (L) 3.5 - 5.1 mmol/L   Chloride 100 98 - 111 mmol/L   CO2 24 22 - 32 mmol/L   Glucose, Bld 100 (H) 70 - 99 mg/dL    Comment: Glucose reference range applies only to samples taken after fasting for at least 8 hours.   BUN 55 (H) 8 - 23 mg/dL   Creatinine, Ser 0.99 0.44 - 1.00 mg/dL   Calcium 9.1 8.9 - 10.3 mg/dL   GFR, Estimated 57 (L) >60 mL/min    Comment: (NOTE) Calculated using the CKD-EPI Creatinine Equation (2021)    Anion gap 14 5 - 15    Comment: Performed at [facility], [address] [place], [place] [address]  Magnesium     Status: None   Collection Time: 12/05/20  6:36 AM  Result Value Ref Range   Magnesium 2.2 1.7 - 2.4 mg/dL    Comment: Performed at [facility], [address] [place], [place] [address]  Phosphorus     Status: None   Collection Time: 12/05/20  6:36 AM  Result Value Ref Range   Phosphorus 3.5 2.5 - 4.6 mg/dL    Comment: Performed at [facility], [address] [place], [place] [address]  CBC     Status: Abnormal   Collection Time: 12/05/20  6:36 AM  Result Value Ref Range   WBC 9.8 4.0 - 10.5 K/uL   RBC 4.00 3.87 - 5.11 MIL/uL   Hemoglobin 11.6 (L) 12.0 - 15.0 g/dL   HCT 35.3 (L) 36.0 - 46.0 %   MCV 88.3 80.0 - 100.0 fL   MCH 29.0 26.0 - 34.0 pg   MCHC 32.9 30.0 - 36.0 g/dL   RDW 15.0 11.5 - 15.5 %   Platelets 201 150 - 400 K/uL   nRBC 0.0 0.0 - 0.2 %    Comment: Performed at [facility], [address] [place], [place] [address]  Glucose, capillary     Status: Abnormal   Collection Time: 12/05/20  8:04 AM  Result Value Ref Range   Glucose-Capillary 103 (H) 70 - 99 mg/dL    Comment: Glucose reference range applies only to samples taken after  fasting for at least 8 hours.    Recent Results (from the past 240 hour(s))  Resp Panel by RT-PCR (Flu A&B, Covid) Nasopharyngeal Swab     Status: None   Collection Time: 12/02/20  4:39 PM   Specimen: Nasopharyngeal Swab; Nasopharyngeal(NP) swabs in vial transport medium  Result Value Ref Range Status   SARS Coronavirus 2 by RT PCR NEGATIVE NEGATIVE Final    Comment: (NOTE) SARS-CoV-2 target nucleic acids are NOT DETECTED.  The SARS-CoV-2 RNA is generally detectable in upper respiratory specimens during the acute phase of infection. The lowest concentration of SARS-CoV-2 viral copies this assay can detect is 138 copies/mL. A negative result does not preclude SARS-Cov-2 infection and should not be used as the sole basis for treatment or other patient management decisions. A negative result may occur with  improper specimen collection/handling, submission of specimen other than nasopharyngeal swab, presence of viral mutation(s) within the areas targeted by this assay, and inadequate number of viral copies(<138 copies/mL). A negative result must be combined with clinical observations, patient history, and epidemiological information. The expected result is  Negative.  Fact Sheet for Patients:  EntrepreneurPulse.com.au  Fact Sheet for Healthcare Providers:  IncredibleEmployment.be  This test is no t yet approved or cleared by the [place] FDA and  has been authorized for detection and/or diagnosis of SARS-CoV-2 by FDA under an Emergency Use Authorization (EUA). This EUA will remain  in effect (meaning this test can be used) for the duration of the COVID-19 declaration under Section 564(b)(1) of the Act, 21 U.S.C.section 360bbb-3(b)(1), unless the authorization is terminated  or revoked sooner.       Influenza A by PCR NEGATIVE NEGATIVE Final   Influenza B by PCR NEGATIVE NEGATIVE Final    Comment: (NOTE) The Xpert Xpress  SARS-CoV-2/FLU/RSV plus assay is intended as an aid in the diagnosis of influenza from Nasopharyngeal swab specimens and should not be used as a sole basis for treatment. Nasal washings and aspirates are unacceptable for Xpert Xpress SARS-CoV-2/FLU/RSV testing.  Fact Sheet for Patients: EntrepreneurPulse.com.au  Fact Sheet for Healthcare Providers: IncredibleEmployment.be  This test is not yet approved or cleared by the [place] FDA and has been authorized for detection and/or diagnosis of SARS-CoV-2 by FDA under an Emergency Use Authorization (EUA). This EUA will remain in effect (meaning this test can be used) for the duration of the COVID-19 declaration under Section 564(b)(1) of the Act, 21 U.S.C. section 360bbb-3(b)(1), unless the authorization is terminated or revoked.  Performed at [facility], [address] [place], [place] [address]   MRSA Next Gen by PCR, Nasal     Status: None   Collection Time: 12/04/20  9:05 PM   Specimen: Nasal Mucosa; Nasal Swab  Result Value Ref Range Status   MRSA by PCR Next Gen NOT DETECTED NOT DETECTED Final    Comment: (NOTE) The GeneXpert MRSA Assay (FDA approved for NASAL specimens only), is one component of a comprehensive MRSA colonization surveillance program. It is not intended to diagnose MRSA infection nor to guide or monitor treatment for MRSA infections. Test performance is not FDA approved in patients less than 72 years old. Performed at [facility], [address] [place], [place] [address]     Lipid Panel No results for input(s): CHOL, TRIG, HDL, CHOLHDL, VLDL, LDLCALC in the last 72 hours.  Studies/Results: CT ANGIO HEAD NECK W WO CM  Result Date: 12/04/2020 CLINICAL DATA:  Neuro deficit, acute, stroke suspected. Unresponsive. EXAM: CT ANGIOGRAPHY HEAD AND NECK TECHNIQUE: Multidetector CT imaging of the head and neck was performed using the standard  protocol during bolus administration of intravenous contrast. Multiplanar CT image reconstructions and MIPs were obtained to evaluate the vascular anatomy. Carotid stenosis measurements (when applicable) are obtained utilizing NASCET criteria, using the distal internal carotid diameter as the denominator. CONTRAST:  25mL OMNIPAQUE IOHEXOL 350 MG/ML SOLN COMPARISON:  No pertinent prior exams available for comparison. FINDINGS: CT HEAD FINDINGS Brain: Mild generalized cerebral atrophy. A subtle acute cortical infarct is questioned within the right parietal lobe (for instance as seen on series 3, image 27). Background advanced patchy and ill-defined hypoattenuation within the cerebral white matter, nonspecific but compatible with chronic small vessel ischemic disease. There is no acute intracranial hemorrhage. No extra-axial fluid collection. No evidence of an intracranial mass. No midline shift. Vascular: No hyperdense vessel.  Atherosclerotic calcifications. Skull: Normal. Negative for fracture or focal lesion. Sinuses: No significant paranasal sinus disease. Orbits: No mass or acute finding. Review of the MIP images confirms the above findings These results were called by telephone at the time  of interpretation on 12/04/2020 at 12:55 pm to provider Makenleigh Crownover [facility] , who verbally acknowledged these results. CTA NECK FINDINGS Aortic arch: Standard aortic branching. Atherosclerotic plaque within the visualized aortic arch and proximal major branch vessels of the neck. No hemodynamically significant innominate or proximal subclavian artery stenosis. Right carotid system: CCA and ICA patent within the neck without hemodynamically significant stenosis (50% or greater). Mild-to-moderate atherosclerotic plaque within the carotid bifurcation and proximal ICA. Left carotid system: CCA and ICA patent within the neck without hemodynamically significant stenosis (50% or greater). Moderate atherosclerotic plaque within the carotid  bifurcation and proximal ICA. Vertebral arteries: Vertebral arteries patent within the neck bilaterally. Mild atherosclerotic narrowing at the origin of the right vertebral artery. Skeleton: Cervical spondylosis. No acute bony abnormality or aggressive osseous lesion. Other neck: No neck mass or cervical lymphadenopathy. Upper chest: Patchy opacity within the imaged left lower lobe pneumonia or atelectasis. Review of the MIP images confirms the above findings CTA HEAD FINDINGS Anterior circulation: The intracranial internal carotid arteries are patent. Atherosclerotic plaque within both vessels with up to moderate stenosis. The M1 middle cerebral arteries are patent. Significant atherosclerotic irregularity of the M2 and more distal middle cerebral artery vessels bilaterally. Most notably, there are sites of up to severe stenosis within the proximal M2 right MCA vessels. No proximal M2 branch occlusion is identified. The anterior cerebral arteries are patent. 2 mm inferiorly projecting vascular protrusion arising from the supraclinoid left ICA, likely reflecting an aneurysm. Posterior circulation: The intracranial vertebral arteries are patent. The basilar artery is patent. The posterior cerebral arteries are patent. Hypoplastic right P1 segment with sizable right posterior communicating artery. The left posterior communicating artery is hypoplastic or absent Venous sinuses: Within the limitations of contrast timing, no convincing thrombus. Anatomic variants: As described Review of the MIP images confirms the above findings No intracranial large vessel occlusion identified. These results were called by telephone at the time of interpretation on 12/04/2020 at 12:56 pm to provider Breindy Meadow [facility] , who verbally acknowledged these results. IMPRESSION: CT head: 1. No evidence of acute intracranial hemorrhage. 2. A small acute cortical infarct is questioned within the right parietal lobe. 3. Advanced chronic small vessel  ischemic changes within the cerebral white matter. 4. Generalized parenchymal atrophy. CTA neck: 1. The common carotid, internal carotid and vertebral arteries are patent within the neck without hemodynamically significant stenosis (50% or greater). 2. Opacity within the imaged left lower lobe, which may reflect atelectasis or pneumonia. CTA Head: 1. No intracranial large vessel occlusion identified. 2. Atherosclerotic irregularity of the M2 and more distal middle cerebral artery vessels bilaterally. Most notably, there are sites of up to severe stenosis within proximal right M2 MCA vessels. 3. 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 Electronically Signed   By: Kellie Simmering D.O.   On: 12/04/2020 13:07   MR ANGIO HEAD WO CONTRAST  Result Date: 12/04/2020 CLINICAL DATA:  Initial evaluation for neuro deficit, stroke suspected. EXAM: MRI HEAD WITHOUT CONTRAST MRA HEAD WITHOUT CONTRAST TECHNIQUE: Multiplanar, multi-echo pulse sequences of the brain and surrounding structures were acquired without intravenous contrast. Angiographic images of the Circle of Willis were acquired using MRA technique without intravenous contrast. COMPARISON:  CTA from earlier the same day. FINDINGS: MRI HEAD FINDINGS Brain: Cerebral volume within normal limits for age. Patchy T2/FLAIR hyperintensity involving the periventricular deep white matter both cerebral hemispheres as well as the pons, most consistent with chronic small vessel ischemic disease, moderate in nature. Scattered areas  of restricted diffusion involving the cortical and subcortical aspect of the right frontal, parietal, and occipital lobes, consistent with acute ischemic infarcts. These are positioned in a linear watershed distribution. There are similar but less pronounced watershed type infarcts involving the contralateral left cerebral hemisphere, with involvement of the left frontal and parietal lobes. No associated hemorrhage or mass effect. No  other evidence for acute or chronic intracranial hemorrhage. No mass lesion, midline shift or mass effect. No hydrocephalus or extra-axial fluid collection. Pituitary gland suprasellar region normal. Midline structures intact. Vascular: Major intracranial vascular flow voids are maintained. Skull and upper cervical spine: Prominent osteoarthritic changes with degenerative thickening noted about the C1-2 articulation and tectorial membrane. No significant stenosis at the craniocervical junction. Bone marrow signal intensity within normal limits. No scalp soft tissue abnormality. Sinuses/Orbits: Globes and orbital soft tissues within normal limits. Paranasal sinuses are clear. No mastoid effusion. Inner ear structures grossly normal. Other: None. MRA HEAD FINDINGS Anterior circulation: Examination degraded by motion artifact. Visualized distal cervical segments of the internal carotid arteries are patent with antegrade flow. Petrous, cavernous, and supraclinoid segments patent without hemodynamically significant stenosis. 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 A1 segments patent. Normal anterior communicating artery complex. Anterior cerebral arteries patent without significant stenosis. No M1 stenosis or occlusion. Normal MCA bifurcations. Distal MCA branches perfused and symmetric. Distal small vessel atheromatous irregularity, better evaluated on prior CTA. Posterior circulation: Vertebral arteries widely patent to the vertebrobasilar junction. Right vertebral artery dominant. Both PICA origins patent and normal. Basilar patent to its distal aspect without stenosis. Superior cerebellar arteries patent bilaterally. Left PCA supplied via the basilar. Right PCA supplied via a hypoplastic right P1 segment and robust right posterior communicating artery. Both PCAs remain patent to their distal aspects without appreciable stenosis. Distal small vessel  atheromatous irregularity. Anatomic variants: Hypoplastic right P1 segment and robust right posterior communicating artery. IMPRESSION: MRI HEAD IMPRESSION: 1. 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 No associated hemorrhage or mass effect. 2. Underlying moderately advanced chronic microvascular ischemic disease. MRA HEAD IMPRESSION: 1. Negative intracranial MRA for large vessel occlusion. No hemodynamically significant or correctable stenosis. 2. Distal small vessel atheromatous irregularity, better evaluated on prior CTA. 3. 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 Electronically Signed   By: Jeannine Boga M.D.   On: 12/04/2020 21:22   MR BRAIN WO CONTRAST  Result Date: 12/04/2020 CLINICAL DATA:  Initial evaluation for neuro deficit, stroke suspected. EXAM: MRI HEAD WITHOUT CONTRAST MRA HEAD WITHOUT CONTRAST TECHNIQUE: Multiplanar, multi-echo pulse sequences of the brain and surrounding structures were acquired without intravenous contrast. Angiographic images of the Circle of Willis were acquired using MRA technique without intravenous contrast. COMPARISON:  CTA from earlier the same day. FINDINGS: MRI HEAD FINDINGS Brain: Cerebral volume within normal limits for age. Patchy T2/FLAIR hyperintensity involving the periventricular deep white matter both cerebral hemispheres as well as the pons, most consistent with chronic small vessel ischemic disease, moderate in nature. Scattered areas of restricted diffusion involving the cortical and subcortical aspect of the right frontal, parietal, and occipital lobes, consistent with acute ischemic infarcts. These are positioned in a linear watershed distribution. There are similar but less pronounced watershed type infarcts involving the contralateral left cerebral hemisphere, with involvement of the left frontal  and parietal lobes. No associated hemorrhage or mass  effect. No other evidence for acute or chronic intracranial hemorrhage. No mass lesion, midline shift or mass effect. No hydrocephalus or extra-axial fluid collection. Pituitary gland suprasellar region normal. Midline structures intact. Vascular: Major intracranial vascular flow voids are maintained. Skull and upper cervical spine: Prominent osteoarthritic changes with degenerative thickening noted about the C1-2 articulation and tectorial membrane. No significant stenosis at the craniocervical junction. Bone marrow signal intensity within normal limits. No scalp soft tissue abnormality. Sinuses/Orbits: Globes and orbital soft tissues within normal limits. Paranasal sinuses are clear. No mastoid effusion. Inner ear structures grossly normal. Other: None. MRA HEAD FINDINGS Anterior circulation: Examination degraded by motion artifact. Visualized distal cervical segments of the internal carotid arteries are patent with antegrade flow. Petrous, cavernous, and supraclinoid segments patent without hemodynamically significant stenosis. 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 A1 segments patent. Normal anterior communicating artery complex. Anterior cerebral arteries patent without significant stenosis. No M1 stenosis or occlusion. Normal MCA bifurcations. Distal MCA branches perfused and symmetric. Distal small vessel atheromatous irregularity, better evaluated on prior CTA. Posterior circulation: Vertebral arteries widely patent to the vertebrobasilar junction. Right vertebral artery dominant. Both PICA origins patent and normal. Basilar patent to its distal aspect without stenosis. Superior cerebellar arteries patent bilaterally. Left PCA supplied via the basilar. Right PCA supplied via a hypoplastic right P1 segment and robust right posterior communicating artery. Both PCAs remain patent to their distal  aspects without appreciable stenosis. Distal small vessel atheromatous irregularity. Anatomic variants: Hypoplastic right P1 segment and robust right posterior communicating artery. IMPRESSION: MRI HEAD IMPRESSION: 1. 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 No associated hemorrhage or mass effect. 2. Underlying moderately advanced chronic microvascular ischemic disease. MRA HEAD IMPRESSION: 1. Negative intracranial MRA for large vessel occlusion. No hemodynamically significant or correctable stenosis. 2. Distal small vessel atheromatous irregularity, better evaluated on prior CTA. 3. 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 Electronically Signed   By: Jeannine Boga M.D.   On: 12/04/2020 21:22   DG Chest Port 1 View  Result Date: 12/04/2020 CLINICAL DATA:  Dyspnea EXAM: PORTABLE CHEST 1 VIEW COMPARISON:  None. Findings are correlated with CT examination of the abdomen pelvis of 12/02/2020 FINDINGS: Lung volumes are small. No pneumothorax or pleural effusion. Right paratracheal soft tissue thickening may reflect vascular shadow on this rotated examination, however, a paramediastinal soft tissue mass or adenopathy could appear similarly. This is not well characterized on this examination. Cardiac size is within normal limits. Opacity within the right cardiophrenic angle may relate to the patient's known large hiatal hernia. Nasogastric tube appears looped within the inferior thorax, likely within the known hiatal hernia. IMPRESSION: Pulmonary hypoinflation. Nasogastric tube looped within the inferior thorax, likely within known large hiatal hernia. Right paratracheal soft tissue opacity, not well characterized, possibly artifactual and related to rotation. This would be better assessed with a standard two view chest radiograph or CT examination when the  patient is clinically able to do so. Electronically Signed   By: Fidela Salisbury M.D.   On: 12/04/2020 21:41    Medications: Scheduled:  amLODipine  5 mg Oral QHS   atorvastatin  40 mg Per Tube Daily   carvedilol  6.25 mg Oral BID WC   Chlorhexidine Gluconate Cloth  6 each Topical Daily   enoxaparin (LOVENOX) injection  40 mg Subcutaneous  Q24H   LORazepam  2 mg Intramuscular Once   [START ON 12/06/2020] mouth rinse  15 mL Mouth Rinse BID   pantoprazole (PROTONIX) IV  40 mg Intravenous QHS   sertraline  25 mg Oral Daily   triamcinolone cream  1 application Topical BID   Continuous:  sodium chloride 10 mL/hr at 12/05/20 0400   sodium chloride     piperacillin-tazobactam (ZOSYN)  IV 3.375 g (12/05/20 0503)   potassium chloride     Assessment: 80 year old female, POD #3 after abdominal surgery for strangulated hernia repair, who was noted to be unresponsive yesterday morning. MRI brain revealed relatively extensive bilateral watershed infarctions. BPs were low early yesterday AM and have since improved. The watershed infarcts most likely occurred in the early AM hours on Saturday given the time course of the BP readings.  1. Exam reveals a patient with continued obtunded state, but slightly improved from yesterday.  2. MRI brain reveals patchy acute cortical and subcortical watershed type ischemic infarcts involving the bilateral cerebral hemispheres, right worse than left. No associated hemorrhage or mass effect. Underlying moderately advanced chronic microvascular ischemic disease. 3. CTA of neck: The common carotid, internal carotid and vertebral arteries are patent within the neck without hemodynamically significant stenosis (50% or greater).  4. CTA of head:  No intracranial large vessel occlusion identified. Atherosclerotic irregularity of the M2 and more distal middle cerebral artery vessels bilaterally. Most notably, there are sites of up to severe stenosis within proximal right M2 MCA  vessels.    Recommendations: 1. Given that her acute strokes are due to hypotension rather than clot formation, starting ASA would not be high priority, especially given her recent major surgery 2. IVF 3. Keep her SBP above 130 and less than 180. Will need close BP monitoring.  4. Neurology will follow PRN. Please call if there are additional questions.      LOS: 3 days   @Electronically  signed: Dr. Kerney Elbe 12/05/2020  10:20 AM

## 2020-12-05 NOTE — Progress Notes (Signed)
   12/05/20 1132  Assess: MEWS Score  Temp 97.9 F (36.6 C)  BP (!) 198/75  Pulse Rate 81  Resp (!) 25  SpO2 99 %  O2 Device Venturi Mask  O2 Flow Rate (L/min) 7 L/min  Assess: MEWS Score  MEWS Temp 0  MEWS Systolic 0  MEWS Pulse 0  MEWS RR 1  MEWS LOC 3  MEWS Score 4  MEWS Score Color Red  Assess: if the MEWS score is Yellow or Red  Were vital signs taken at a resting state? Yes  Focused Assessment No change from prior assessment  Does the patient meet 2 or more of the SIRS criteria? No  MEWS guidelines implemented *See Row Information* No, previously red, continue vital signs every 4 hours (no escalation of care)  Treat  Pain Scale Faces  Pain Score 0  Faces Pain Scale 0  Document  Patient Outcome Other (Comment) (Patient remains on the floor, has just been transferred from ICU.  Pt is DNR/DNI, with no escalation of care.)  Assess: SIRS CRITERIA  SIRS Temperature  0  SIRS Pulse 0  SIRS Respirations  1  SIRS WBC 0  SIRS Score Sum  1

## 2020-12-05 NOTE — Plan of Care (Signed)
Discussed in front of patient plan of care for the evening, pain management and breathing with no evidence of learning.  Both daughters have talked night NP in ICU about MRI results.  Problem: Education: Goal: Knowledge of General Education information will improve Description: Including pain rating scale, medication(s)/side effects and non-pharmacologic comfort measures 12/05/2020 0056 by Jannette Fogo, RN Outcome: Not Progressing  Problem: Health Behavior/Discharge Planning: Goal: Ability to manage health-related needs will improve 12/05/2020 0056 by Jannette Fogo, RN Outcome: Not Progressing

## 2020-12-06 ENCOUNTER — Inpatient Hospital Stay: Payer: Medicare PPO

## 2020-12-06 DIAGNOSIS — K4030 Unilateral inguinal hernia, with obstruction, without gangrene, not specified as recurrent: Secondary | ICD-10-CM | POA: Diagnosis not present

## 2020-12-06 DIAGNOSIS — E785 Hyperlipidemia, unspecified: Secondary | ICD-10-CM

## 2020-12-06 DIAGNOSIS — I10 Essential (primary) hypertension: Secondary | ICD-10-CM

## 2020-12-06 DIAGNOSIS — D649 Anemia, unspecified: Secondary | ICD-10-CM | POA: Diagnosis not present

## 2020-12-06 DIAGNOSIS — I639 Cerebral infarction, unspecified: Secondary | ICD-10-CM

## 2020-12-06 DIAGNOSIS — F418 Other specified anxiety disorders: Secondary | ICD-10-CM | POA: Diagnosis not present

## 2020-12-06 LAB — BASIC METABOLIC PANEL
Anion gap: 9 (ref 5–15)
BUN: 35 mg/dL — ABNORMAL HIGH (ref 8–23)
CO2: 24 mmol/L (ref 22–32)
Calcium: 8.8 mg/dL — ABNORMAL LOW (ref 8.9–10.3)
Chloride: 108 mmol/L (ref 98–111)
Creatinine, Ser: 0.55 mg/dL (ref 0.44–1.00)
GFR, Estimated: >60 mL/min (ref >60)
Glucose, Bld: 105 mg/dL — ABNORMAL HIGH (ref 70–99)
Potassium: 3.4 mmol/L — ABNORMAL LOW (ref 3.5–5.1)
Sodium: 141 mmol/L (ref 135–145)

## 2020-12-06 LAB — CBC
HCT: 33.6 % — ABNORMAL LOW (ref 36.0–46.0)
Hemoglobin: 11.2 g/dL — ABNORMAL LOW (ref 12.0–15.0)
MCH: 30.3 pg (ref 26.0–34.0)
MCHC: 33.3 g/dL (ref 30.0–36.0)
MCV: 90.8 fL (ref 80.0–100.0)
Platelets: 219 10*3/uL (ref 150–400)
RBC: 3.7 MIL/uL — ABNORMAL LOW (ref 3.87–5.11)
RDW: 14.9 % (ref 11.5–15.5)
WBC: 9.8 10*3/uL (ref 4.0–10.5)
nRBC: 0 % (ref 0.0–0.2)

## 2020-12-06 LAB — SURGICAL PATHOLOGY

## 2020-12-06 MED ORDER — CARVEDILOL 6.25 MG PO TABS
6.2500 mg | ORAL_TABLET | Freq: Two times a day (BID) | ORAL | Status: DC
  Administered 2020-12-06 – 2020-12-08 (×4): 6.25 mg
  Filled 2020-12-06 (×4): qty 1

## 2020-12-06 MED ORDER — POTASSIUM CHLORIDE 10 MEQ/100ML IV SOLN
10.0000 meq | INTRAVENOUS | Status: AC
  Administered 2020-12-06 (×4): 10 meq via INTRAVENOUS
  Filled 2020-12-06 (×4): qty 100

## 2020-12-06 NOTE — Plan of Care (Signed)
  Problem: Education: Goal: Knowledge of General Education information will improve Description: Including pain rating scale, medication(s)/side effects and non-pharmacologic comfort measures 12/06/2020 1719 by Conard Novak, RN Outcome: Progressing 12/06/2020 1717 by Conard Novak, RN Outcome: Progressing   Problem: Education: Goal: Knowledge of General Education information will improve Description: Including pain rating scale, medication(s)/side effects and non-pharmacologic comfort measures 12/06/2020 1719 by Conard Novak, RN Outcome: Progressing 12/06/2020 1717 by Conard Novak, RN Outcome: Progressing   Problem: Health Behavior/Discharge Planning: Goal: Ability to manage health-related needs will improve 12/06/2020 1719 by Conard Novak, RN Outcome: Progressing 12/06/2020 1717 by Conard Novak, RN Outcome: Progressing   Problem: Clinical Measurements: Goal: Ability to maintain clinical measurements within normal limits will improve 12/06/2020 1719 by Conard Novak, RN Outcome: Progressing 12/06/2020 1717 by Conard Novak, RN Outcome: Progressing Goal: Will remain free from infection 12/06/2020 1719 by Conard Novak, RN Outcome: Progressing 12/06/2020 1717 by Conard Novak, RN Outcome: Progressing Goal: Diagnostic test results will improve 12/06/2020 1719 by Conard Novak, RN Outcome: Progressing 12/06/2020 1717 by Conard Novak, RN Outcome: Progressing Goal: Respiratory complications will improve 12/06/2020 1719 by Conard Novak, RN Outcome: Progressing 12/06/2020 1717 by Conard Novak, RN Outcome: Progressing Goal: Cardiovascular complication will be avoided 12/06/2020 1719 by Conard Novak, RN Outcome: Progressing 12/06/2020 1717 by Conard Novak, RN Outcome: Progressing   Problem: Coping: Goal: Level of anxiety will decrease 12/06/2020 1719 by Conard Novak, RN Outcome: Progressing 12/06/2020 1717 by  Conard Novak, RN Outcome: Progressing   Problem: Pain Managment: Goal: General experience of comfort will improve 12/06/2020 1719 by Conard Novak, RN Outcome: Progressing 12/06/2020 1717 by Conard Novak, RN Outcome: Progressing   Problem: Safety: Goal: Ability to remain free from injury will improve 12/06/2020 1719 by Conard Novak, RN Outcome: Progressing 12/06/2020 1717 by Conard Novak, RN Outcome: Progressing   Problem: Skin Integrity: Goal: Risk for impaired skin integrity will decrease 12/06/2020 1719 by Conard Novak, RN Outcome: Progressing 12/06/2020 1717 by Conard Novak, RN Outcome: Progressing

## 2020-12-06 NOTE — Consult Note (Signed)
ORTHOPAEDIC CONSULTATION  REQUESTING PHYSICIAN: Flora Lipps, MD  Chief Complaint: right knee deformity  HPI: [name] is a 80 y.o. female who was admitted for hernia surgery c/b stroke. She was noted to have obvious deformity of the knee today. No known fall or injury. She is unable to follow commands and withdraws with painful stimuli. History obtained from her nurse, the chart and her daughter.  Past Medical History:  Diagnosis Date   Anemia    Colon polyp    History of small bowel obstruction 9/09   likely due to adhesions- pt refused most dx and tx in hospital   History of vaginal bleeding    post menopausal- gyn eval, ? polyp   Hyperlipidemia    Osteoarthritis    knee   Schizoaffective disorder    paranoid personality- refuses treatment   Vitamin D deficiency    Past Surgical History:  Procedure Laterality Date   ANTERIOR AND POSTERIOR REPAIR N/A 04/23/2012   Procedure: ANTERIOR   REPAIR CYSTOCELE;  Surgeon: Reece Packer, MD;  Location: [facility];  Service: Urology;  Laterality: N/A;  cysto;graft 10x6   APPENDECTOMY     bladder tack  1976   BOWEL RESECTION N/A 12/02/2020   Procedure: SMALL BOWEL RESECTION;  Surgeon: Benjamine Sprague, DO;  Location: [facility];  Service: General;  Laterality: N/A;   INGUINAL HERNIA REPAIR Right 12/02/2020   Procedure: HERNIA REPAIR INGUINAL ADULT;  Surgeon: Benjamine Sprague, DO;  Location: [facility];  Service: General;  Laterality: Right;   KNEE ARTHROSCOPY     right   KNEE ARTHROSCOPY Right    LAPAROSCOPY  1970's   twisted fallopian tube   SALPINGOOPHORECTOMY Bilateral 04/23/2012   Procedure: SALPINGO OOPHORECTOMY;  Surgeon: Emily Filbert, MD;  Location: [facility];  Service: Gynecology;  Laterality: Bilateral;   TONSILLECTOMY     VAGINAL HYSTERECTOMY N/A 04/23/2012   Procedure: HYSTERECTOMY VAGINAL;  Surgeon: Emily Filbert, MD;  Location: [facility];  Service: Gynecology;  Laterality: N/A;   VAGINAL PROLAPSE REPAIR N/A 04/23/2012    Procedure: VAGINAL VAULT SUSPENSION;  Surgeon: Reece Packer, MD;  Location: [facility];  Service: Urology;  Laterality: N/A;   Social History   Socioeconomic History   Marital status: Widowed    Spouse name: Not on file   Number of children: 3   Years of education: Not on file   Highest education level: Not on file  Occupational History   Occupation: retired Pharmacist, hospital  Tobacco Use   Smoking status: Former    Packs/day: 0.50    Years: 20.00    Pack years: 10.00    Types: Cigarettes    Quit date: 03/13/1982    Years since quitting: 38.7   Smokeless tobacco: Never  Vaping Use   Vaping Use: Never used  Substance and Sexual Activity   Alcohol use: No   Drug use: No   Sexual activity: Yes    Birth control/protection: Post-menopausal  Other Topics Concern   Not on file  Social History Narrative   Lives alone   Social Determinants of Health   Financial Resource Strain: Not on file  Food Insecurity: Not on file  Transportation Needs: Not on file  Physical Activity: Not on file  Stress: Not on file  Social Connections: Not on file   Family History  Problem Relation Age of Onset   Heart disease Mother    Diabetes Mother    Skin cancer Mother    Diabetes Father  Heart disease Father    Hypertension Father    Diabetes Brother    Arthritis Brother    Hypertension Brother    Hyperlipidemia Brother    Arthritis Daughter    Cancer Son    Obesity Daughter    Allergies  Allergen Reactions   Penicillins Rash    TOLERATED CEFAZOLIN   Prior to Admission medications   Medication Sig Start Date End Date Taking? Authorizing Provider  sertraline (ZOLOFT) 25 MG tablet Take 1 tablet (25 mg total) by mouth daily. 11/25/20  Yes Jon Billings, NP  triamcinolone cream (KENALOG) 0.1 % Apply 1 application topically 2 (two) times daily. 10/12/20  Yes McElwee, Lauren A, NP  amLODipine (NORVASC) 5 MG tablet Take 1 tablet (5 mg total) by mouth at bedtime. 11/25/20   Jon Billings, NP   carvedilol (COREG) 6.25 MG tablet Take 1 tablet (6.25 mg total) by mouth 2 (two) times daily with a meal. 11/25/20   Jon Billings, NP  gabapentin (NEURONTIN) 100 MG capsule Take 1 capsule (100 mg total) by mouth at bedtime. Take 1 capsule at bedtime. If no relief, can go up to 2 tablets at bedtime after 3 days. 09/09/20   McElwee, Lauren A, NP  lisinopril (ZESTRIL) 40 MG tablet Take 1 tablet (40 mg total) by mouth daily. 11/25/20   Jon Billings, NP  rosuvastatin (CRESTOR) 10 MG tablet Take 1 tablet (10 mg total) by mouth daily. 11/25/20   Jon Billings, NP  tiZANidine (ZANAFLEX) 4 MG tablet Take 4 mg by mouth at bedtime. 09/28/20   [provider]  traMADol (ULTRAM) 50 MG tablet Take 50 mg by mouth every 8 (eight) hours as needed. Patient not taking: Reported on 12/03/2020 09/23/20   [provider]   DG Knee 1-2 Views Right  Result Date: 12/06/2020 CLINICAL DATA:  RIGHT knee dislocation EXAM: RIGHT KNEE - 1-2 VIEW COMPARISON:  None. FINDINGS: Lateral projection demonstrates anterior dislocation of the femoral component in relation to the tibial component. No evidence of fracture. IMPRESSION: Anterior dislocation the knee joint. Electronically Signed   By: Suzy Bouchard M.D.   On: 12/06/2020 14:46   MR ANGIO HEAD WO CONTRAST  Result Date: 12/04/2020 CLINICAL DATA:  Initial evaluation for neuro deficit, stroke suspected. EXAM: MRI HEAD WITHOUT CONTRAST MRA HEAD WITHOUT CONTRAST TECHNIQUE: Multiplanar, multi-echo pulse sequences of the brain and surrounding structures were acquired without intravenous contrast. Angiographic images of the Circle of Willis were acquired using MRA technique without intravenous contrast. COMPARISON:  CTA from earlier the same day. FINDINGS: MRI HEAD FINDINGS Brain: Cerebral volume within normal limits for age. Patchy T2/FLAIR hyperintensity involving the periventricular deep white matter both cerebral hemispheres as well as the pons, most  consistent with chronic small vessel ischemic disease, moderate in nature. Scattered areas of restricted diffusion involving the cortical and subcortical aspect of the right frontal, parietal, and occipital lobes, consistent with acute ischemic infarcts. These are positioned in a linear watershed distribution. There are similar but less pronounced watershed type infarcts involving the contralateral left cerebral hemisphere, with involvement of the left frontal and parietal lobes. No associated hemorrhage or mass effect. No other evidence for acute or chronic intracranial hemorrhage. No mass lesion, midline shift or mass effect. No hydrocephalus or extra-axial fluid collection. Pituitary gland suprasellar region normal. Midline structures intact. Vascular: Major intracranial vascular flow voids are maintained. Skull and upper cervical spine: Prominent osteoarthritic changes with degenerative thickening noted about the C1-2 articulation and tectorial membrane. No significant stenosis at  the craniocervical junction. Bone marrow signal intensity within normal limits. No scalp soft tissue abnormality. Sinuses/Orbits: Globes and orbital soft tissues within normal limits. Paranasal sinuses are clear. No mastoid effusion. Inner ear structures grossly normal. Other: None. MRA HEAD FINDINGS Anterior circulation: Examination degraded by motion artifact. Visualized distal cervical segments of the internal carotid arteries are patent with antegrade flow. Petrous, cavernous, and supraclinoid segments patent without hemodynamically significant stenosis. 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 A1 segments patent. Normal anterior communicating artery complex. Anterior cerebral arteries patent without significant stenosis. No M1 stenosis or occlusion. Normal MCA bifurcations. Distal MCA branches perfused and symmetric. Distal small vessel atheromatous irregularity, better  evaluated on prior CTA. Posterior circulation: Vertebral arteries widely patent to the vertebrobasilar junction. Right vertebral artery dominant. Both PICA origins patent and normal. Basilar patent to its distal aspect without stenosis. Superior cerebellar arteries patent bilaterally. Left PCA supplied via the basilar. Right PCA supplied via a hypoplastic right P1 segment and robust right posterior communicating artery. Both PCAs remain patent to their distal aspects without appreciable stenosis. Distal small vessel atheromatous irregularity. Anatomic variants: Hypoplastic right P1 segment and robust right posterior communicating artery. IMPRESSION: MRI HEAD IMPRESSION: 1. 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 No associated hemorrhage or mass effect. 2. Underlying moderately advanced chronic microvascular ischemic disease. MRA HEAD IMPRESSION: 1. Negative intracranial MRA for large vessel occlusion. No hemodynamically significant or correctable stenosis. 2. Distal small vessel atheromatous irregularity, better evaluated on prior CTA. 3. 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 Electronically Signed   By: Jeannine Boga M.D.   On: 12/04/2020 21:22   MR BRAIN WO CONTRAST  Result Date: 12/04/2020 CLINICAL DATA:  Initial evaluation for neuro deficit, stroke suspected. EXAM: MRI HEAD WITHOUT CONTRAST MRA HEAD WITHOUT CONTRAST TECHNIQUE: Multiplanar, multi-echo pulse sequences of the brain and surrounding structures were acquired without intravenous contrast. Angiographic images of the Circle of Willis were acquired using MRA technique without intravenous contrast. COMPARISON:  CTA from earlier the same day. FINDINGS: MRI HEAD FINDINGS Brain: Cerebral volume within normal limits for age. Patchy T2/FLAIR hyperintensity involving the periventricular deep white  matter both cerebral hemispheres as well as the pons, most consistent with chronic small vessel ischemic disease, moderate in nature. Scattered areas of restricted diffusion involving the cortical and subcortical aspect of the right frontal, parietal, and occipital lobes, consistent with acute ischemic infarcts. These are positioned in a linear watershed distribution. There are similar but less pronounced watershed type infarcts involving the contralateral left cerebral hemisphere, with involvement of the left frontal and parietal lobes. No associated hemorrhage or mass effect. No other evidence for acute or chronic intracranial hemorrhage. No mass lesion, midline shift or mass effect. No hydrocephalus or extra-axial fluid collection. Pituitary gland suprasellar region normal. Midline structures intact. Vascular: Major intracranial vascular flow voids are maintained. Skull and upper cervical spine: Prominent osteoarthritic changes with degenerative thickening noted about the C1-2 articulation and tectorial membrane. No significant stenosis at the craniocervical junction. Bone marrow signal intensity within normal limits. No scalp soft tissue abnormality. Sinuses/Orbits: Globes and orbital soft tissues within normal limits. Paranasal sinuses are clear. No mastoid effusion. Inner ear structures grossly normal. Other: None. MRA HEAD FINDINGS Anterior circulation: Examination degraded by motion artifact. Visualized distal cervical segments of the internal carotid arteries are patent with  antegrade flow. Petrous, cavernous, and supraclinoid segments patent without hemodynamically significant stenosis. 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 A1 segments patent. Normal anterior communicating artery complex. Anterior cerebral arteries patent without significant stenosis. No M1 stenosis or occlusion. Normal MCA bifurcations. Distal MCA branches perfused and  symmetric. Distal small vessel atheromatous irregularity, better evaluated on prior CTA. Posterior circulation: Vertebral arteries widely patent to the vertebrobasilar junction. Right vertebral artery dominant. Both PICA origins patent and normal. Basilar patent to its distal aspect without stenosis. Superior cerebellar arteries patent bilaterally. Left PCA supplied via the basilar. Right PCA supplied via a hypoplastic right P1 segment and robust right posterior communicating artery. Both PCAs remain patent to their distal aspects without appreciable stenosis. Distal small vessel atheromatous irregularity. Anatomic variants: Hypoplastic right P1 segment and robust right posterior communicating artery. IMPRESSION: MRI HEAD IMPRESSION: 1. 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 No associated hemorrhage or mass effect. 2. Underlying moderately advanced chronic microvascular ischemic disease. MRA HEAD IMPRESSION: 1. Negative intracranial MRA for large vessel occlusion. No hemodynamically significant or correctable stenosis. 2. Distal small vessel atheromatous irregularity, better evaluated on prior CTA. 3. 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 Electronically Signed   By: Jeannine Boga M.D.   On: 12/04/2020 21:22   DG Chest Port 1 View  Result Date: 12/04/2020 CLINICAL DATA:  Dyspnea EXAM: PORTABLE CHEST 1 VIEW COMPARISON:  None. Findings are correlated with CT examination of the abdomen pelvis of 12/02/2020 FINDINGS: Lung volumes are small. No pneumothorax or pleural effusion. Right paratracheal soft tissue thickening may reflect vascular shadow on this rotated examination, however, a paramediastinal soft tissue mass or adenopathy could appear similarly. This is not well characterized on this examination. Cardiac size is within normal limits. Opacity  within the right cardiophrenic angle may relate to the patient's known large hiatal hernia. Nasogastric tube appears looped within the inferior thorax, likely within the known hiatal hernia. IMPRESSION: Pulmonary hypoinflation. Nasogastric tube looped within the inferior thorax, likely within known large hiatal hernia. Right paratracheal soft tissue opacity, not well characterized, possibly artifactual and related to rotation. This would be better assessed with a standard two view chest radiograph or CT examination when the patient is clinically able to do so. Electronically Signed   By: Fidela Salisbury M.D.   On: 12/04/2020 21:41    Positive ROS: All other systems have been reviewed and were otherwise negative with the exception of those mentioned in the HPI and as above.  Physical Exam: General: sedated, no acute distress Cardiovascular: No pedal edema Respiratory: No cyanosis, no use of accessory musculature  Skin: No lesions in the area of chief complaint, well healed scar Neurologic: Sensation intact distally Psychiatric: Patient is somnolent and unable to follow commands Lymphatic: No axillary or cervical lymphadenopathy  MUSCULOSKELETAL: tibia is subluxed posteriorly. Compartments soft. Good cap refill. Motor and sensory intact distally.  Assessment: Right total knee dislocation, closed, posterior  Plan: There is currently no evidence of neurovascular compromise and her pain is well controlled. She is high risk for any procedure, but would recommend attempting a closed reduction with sedation only if the family consents. Otherwise strict non-weight bearing on the right leg and frequent neurovascular checks. I spoke with her daughter over the phone and she does not want any procedure performed at this point in  time. Please call with questions.   Lovell Sheehan, MD    12/06/2020 2:54 PM

## 2020-12-06 NOTE — Progress Notes (Signed)
OT Cancellation Note  Patient Details Name: [name] MRN: [id number] DOB: [birth year]   Cancelled Treatment:    Reason Eval/Treat Not Completed: Other (comment). New consult received, chart reviewed. Pt noted with R knee xray just moments for concern of knee dislocation. Palliative consult pending, Ortho consult pending. Per chart, pt obtunded. Will hold OT evaluation today and re-attempt tomorrow as appropriate.  Ardeth Perfect., MPH, MS, OTR/L ascom [phone] 12/06/20, 2:09 PM

## 2020-12-06 NOTE — Progress Notes (Signed)
Nurse notified  

## 2020-12-06 NOTE — Progress Notes (Addendum)
PROGRESS NOTE  [name] XBJ:[id number] DOB: 06-15-1940 DOA: 12/02/2020 PCP: Jon Billings, NP   LOS: 4 days   Brief narrative:  [name] is an 80 y.o. female with a past medical history of hypertension, hyperlipidemia, prediabetes, depression with anxiety, schizoaffective disorder, anemia, skin cancer, was admitted by surgical team for strangulated inguinal hernia.  Status post surgery patient had acute stroke appeared to be obtunded.  Code stroke was called in.  MRI showed watershed type infarcts in bilateral cerebral hemispheres.  Neurology was consulted.  PCCM recommended palliative care consultation.  Patient was then put in hospitalist service for further evaluation and plan.  Assessment/Plan:  Principal Problem:   Strangulated inguinal hernia Active Problems:   Anemia   Hypertension   Stroke ([place])   HLD (hyperlipidemia)   Depression with anxiety   Coma ([place])   Strangulated inguinal hernia:  Status post  right femoral hernia repair with mesh and small bowel resection on 12/02/2020.  IV Zosyn.  On IV fluids.  Management as per primary team.  Bilateral cerebral hemispheric watershed infarctions likely secondary to hypotension resulting in coma:  Patient is more alert awake and communicative but disoriented.  Neurology did not recommend aspirin due to watershed infarct from hypertension.  As per neurology, the goal is to keep blood pressure below 130 and less than 180.  Continue to monitor closely.  Patient is currently NPO.  We will get PT the OT evaluation okay with her orthopedic surgeon. we will get speech evaluation as well for p.o. safety.  Anterior dislocation of right knee.  Orthopedics on board.  Would suggest light sedation.  Avoid hypotension during sedation.  Anemia-normocytic anemia: Hemoglobin 11.2 Will need to monitor closely.   Hypertension Systolic blood pressure goal between 130-180.  Continue to monitor closely.  On IV hydralazine  and Coreg when p.o. okay.   Hyperlipidemia -Lipitor when able to take   Depression with anxiety -Continue home medications when able to take oral meds  DVT prophylaxis: enoxaparin (LOVENOX) injection 40 mg Start: 12/03/20 0800 SCD's Start: 12/03/20 0323   Code Status: DNR  Family Communication: Spoke with the patient's daughter on the phone and updated her about the clinical condition of the patient.  Status is: Inpatient  Remains inpatient appropriate because:Unsafe d/c plan, IV treatments appropriate due to intensity of illness or inability to take PO, Inpatient level of care appropriate due to severity of illness, and palliative care consultation  Dispo: The patient is from: Home              Anticipated d/c is to:  Undetermined at this time              Patient currently is not medically stable to d/c.   Difficult to place patient No   Consultants: Palliative care PCCM General surgery Orthopedics  Procedures: Hernia repair and small bowel resection  Anti-infectives:  Zosyn IV  Anti-infectives (From admission, onward)    Start     Dose/Rate Route Frequency Ordered Stop   12/02/20 2200  piperacillin-tazobactam (ZOSYN) IVPB 3.375 g        3.375 g 12.5 mL/hr over 240 Minutes Intravenous Every 8 hours 12/02/20 2138     12/02/20 1827  ceFAZolin (ANCEF) IVPB 2g/100 mL premix  Status:  Discontinued       Note to Pharmacy: On call to OR   2 g 200 mL/hr over 30 Minutes Intravenous 60 min pre-op 12/02/20 1819 12/02/20 2212       Subjective:  Today, patient was seen and examined at bedside.  Patient  states okay, thank you is more communicative today compared to yesterday  Objective: Vitals:   12/06/20 1100 12/06/20 1207  BP: (!) 161/43 (!) 148/43  Pulse: 80 71  Resp: 20   Temp: 97.7 F (36.5 C)   SpO2: 98% 98%    Intake/Output Summary (Last 24 hours) at 12/06/2020 1346 Last data filed at 12/06/2020 1000 Gross per 24 hour  Intake 1318.2 ml  Output 1225 ml   Net 93.2 ml   Filed Weights   12/04/20 0756 12/04/20 2111  Weight: 82.5 kg 76.7 kg   Body mass index is 29.95 kg/m.   Physical Exam:  GENERAL: Patient is alert awake and say yes and thank you , oriented to self and place not in obvious distress.  NG tube in place, nasal cannula in place HENT: No scleral pallor or icterus. Pupils equally reactive to light. Oral mucosa is moist NECK: is supple, no gross swelling noted. CHEST: Clear to auscultation. No crackles or wheezes.  Diminished breath sounds bilaterally. CVS: S1 and S2 heard, no murmur. Regular rate and rhythm.  ABDOMEN: Soft, non-tender, bowel sounds are present.  External Foley catheter in place  EXTREMITIES: No edema. Right knee anterior dislocation. CNS: 1/5 power lower extremities, flaccid palsy Bilateral upper extremities, speech mildly slurred. SKIN: warm and dry without rashes.  Data Review: I have personally reviewed the following laboratory data and studies,  CBC: Recent Labs  Lab 12/02/20 1232 12/03/20 0558 12/04/20 0535 12/05/20 0636 12/06/20 0430  WBC 16.0* 14.4* 11.6* 9.8 9.8  HGB 14.6 12.1 10.4* 11.6* 11.2*  HCT 41.4 34.4* 29.8* 35.3* 33.6*  MCV 86.8 86.0 87.4 88.3 90.8  PLT 263 216 184 201 503   Basic Metabolic Panel: Recent Labs  Lab 12/02/20 1232 12/03/20 0558 12/04/20 0535 12/05/20 0636 12/06/20 0430  NA 132* 134* 134* 138 141  K 4.4 3.6 3.4* 3.4* 3.4*  CL 98 97* 96* 100 108  CO2 21* 23 25 24 24   GLUCOSE 152* 155* 128* 100* 105*  BUN 31* 35* 48* 55* 35*  CREATININE 0.76 0.60 1.31* 0.99 0.55  CALCIUM 9.9 9.0 8.6* 9.1 8.8*  MG  --   --   --  2.2  --   PHOS  --   --   --  3.5  --    Liver Function Tests: Recent Labs  Lab 12/02/20 1232  AST 33  ALT 16  ALKPHOS 57  BILITOT 2.1*  PROT 8.1  ALBUMIN 4.8   Recent Labs  Lab 12/02/20 1232  LIPASE 25   No results for input(s): AMMONIA in the last 168 hours. Cardiac Enzymes: No results for input(s): CKTOTAL, CKMB, CKMBINDEX,  TROPONINI in the last 168 hours. BNP (last 3 results) No results for input(s): BNP in the last 8760 hours.  ProBNP (last 3 results) No results for input(s): PROBNP in the last 8760 hours.  CBG: Recent Labs  Lab 12/04/20 1204 12/04/20 2112 12/04/20 2337 12/05/20 0804  GLUCAP 99 87 86 103*   Recent Results (from the past 240 hour(s))  Resp Panel by RT-PCR (Flu A&B, Covid) Nasopharyngeal Swab     Status: None   Collection Time: 12/02/20  4:39 PM   Specimen: Nasopharyngeal Swab; Nasopharyngeal(NP) swabs in vial transport medium  Result Value Ref Range Status   SARS Coronavirus 2 by RT PCR NEGATIVE NEGATIVE Final    Comment: (NOTE) SARS-CoV-2 target nucleic acids are NOT DETECTED.  The SARS-CoV-2 RNA is  generally detectable in upper respiratory specimens during the acute phase of infection. The lowest concentration of SARS-CoV-2 viral copies this assay can detect is 138 copies/mL. A negative result does not preclude SARS-Cov-2 infection and should not be used as the sole basis for treatment or other patient management decisions. A negative result may occur with  improper specimen collection/handling, submission of specimen other than nasopharyngeal swab, presence of viral mutation(s) within the areas targeted by this assay, and inadequate number of viral copies(<138 copies/mL). A negative result must be combined with clinical observations, patient history, and epidemiological information. The expected result is Negative.  Fact Sheet for Patients:  EntrepreneurPulse.com.au  Fact Sheet for Healthcare Providers:  IncredibleEmployment.be  This test is no t yet approved or cleared by the [place] FDA and  has been authorized for detection and/or diagnosis of SARS-CoV-2 by FDA under an Emergency Use Authorization (EUA). This EUA will remain  in effect (meaning this test can be used) for the duration of the COVID-19 declaration under Section  564(b)(1) of the Act, 21 U.S.C.section 360bbb-3(b)(1), unless the authorization is terminated  or revoked sooner.       Influenza A by PCR NEGATIVE NEGATIVE Final   Influenza B by PCR NEGATIVE NEGATIVE Final    Comment: (NOTE) The Xpert Xpress SARS-CoV-2/FLU/RSV plus assay is intended as an aid in the diagnosis of influenza from Nasopharyngeal swab specimens and should not be used as a sole basis for treatment. Nasal washings and aspirates are unacceptable for Xpert Xpress SARS-CoV-2/FLU/RSV testing.  Fact Sheet for Patients: EntrepreneurPulse.com.au  Fact Sheet for Healthcare Providers: IncredibleEmployment.be  This test is not yet approved or cleared by the [place] FDA and has been authorized for detection and/or diagnosis of SARS-CoV-2 by FDA under an Emergency Use Authorization (EUA). This EUA will remain in effect (meaning this test can be used) for the duration of the COVID-19 declaration under Section 564(b)(1) of the Act, 21 U.S.C. section 360bbb-3(b)(1), unless the authorization is terminated or revoked.  Performed at [facility], [address] [place], [place] [address]   MRSA Next Gen by PCR, Nasal     Status: None   Collection Time: 12/04/20  9:05 PM   Specimen: Nasal Mucosa; Nasal Swab  Result Value Ref Range Status   MRSA by PCR Next Gen NOT DETECTED NOT DETECTED Final    Comment: (NOTE) The GeneXpert MRSA Assay (FDA approved for NASAL specimens only), is one component of a comprehensive MRSA colonization surveillance program. It is not intended to diagnose MRSA infection nor to guide or monitor treatment for MRSA infections. Test performance is not FDA approved in patients less than 35 years old. Performed at [facility], [address] [place], [place] [address]      Studies: MR ANGIO HEAD WO CONTRAST  Result Date: 12/04/2020 CLINICAL DATA:  Initial evaluation for neuro deficit, stroke  suspected. EXAM: MRI HEAD WITHOUT CONTRAST MRA HEAD WITHOUT CONTRAST TECHNIQUE: Multiplanar, multi-echo pulse sequences of the brain and surrounding structures were acquired without intravenous contrast. Angiographic images of the Circle of Willis were acquired using MRA technique without intravenous contrast. COMPARISON:  CTA from earlier the same day. FINDINGS: MRI HEAD FINDINGS Brain: Cerebral volume within normal limits for age. Patchy T2/FLAIR hyperintensity involving the periventricular deep white matter both cerebral hemispheres as well as the pons, most consistent with chronic small vessel ischemic disease, moderate in nature. Scattered areas of restricted diffusion involving the cortical and subcortical aspect of the right frontal, parietal, and  occipital lobes, consistent with acute ischemic infarcts. These are positioned in a linear watershed distribution. There are similar but less pronounced watershed type infarcts involving the contralateral left cerebral hemisphere, with involvement of the left frontal and parietal lobes. No associated hemorrhage or mass effect. No other evidence for acute or chronic intracranial hemorrhage. No mass lesion, midline shift or mass effect. No hydrocephalus or extra-axial fluid collection. Pituitary gland suprasellar region normal. Midline structures intact. Vascular: Major intracranial vascular flow voids are maintained. Skull and upper cervical spine: Prominent osteoarthritic changes with degenerative thickening noted about the C1-2 articulation and tectorial membrane. No significant stenosis at the craniocervical junction. Bone marrow signal intensity within normal limits. No scalp soft tissue abnormality. Sinuses/Orbits: Globes and orbital soft tissues within normal limits. Paranasal sinuses are clear. No mastoid effusion. Inner ear structures grossly normal. Other: None. MRA HEAD FINDINGS Anterior circulation: Examination degraded by motion artifact. Visualized  distal cervical segments of the internal carotid arteries are patent with antegrade flow. Petrous, cavernous, and supraclinoid segments patent without hemodynamically significant stenosis. 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 A1 segments patent. Normal anterior communicating artery complex. Anterior cerebral arteries patent without significant stenosis. No M1 stenosis or occlusion. Normal MCA bifurcations. Distal MCA branches perfused and symmetric. Distal small vessel atheromatous irregularity, better evaluated on prior CTA. Posterior circulation: Vertebral arteries widely patent to the vertebrobasilar junction. Right vertebral artery dominant. Both PICA origins patent and normal. Basilar patent to its distal aspect without stenosis. Superior cerebellar arteries patent bilaterally. Left PCA supplied via the basilar. Right PCA supplied via a hypoplastic right P1 segment and robust right posterior communicating artery. Both PCAs remain patent to their distal aspects without appreciable stenosis. Distal small vessel atheromatous irregularity. Anatomic variants: Hypoplastic right P1 segment and robust right posterior communicating artery. IMPRESSION: MRI HEAD IMPRESSION: 1. 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 No associated hemorrhage or mass effect. 2. Underlying moderately advanced chronic microvascular ischemic disease. MRA HEAD IMPRESSION: 1. Negative intracranial MRA for large vessel occlusion. No hemodynamically significant or correctable stenosis. 2. Distal small vessel atheromatous irregularity, better evaluated on prior CTA. 3. 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 Electronically Signed   By: Jeannine Boga M.D.   On: 12/04/2020 21:22   MR BRAIN WO CONTRAST  Result Date:  12/04/2020 CLINICAL DATA:  Initial evaluation for neuro deficit, stroke suspected. EXAM: MRI HEAD WITHOUT CONTRAST MRA HEAD WITHOUT CONTRAST TECHNIQUE: Multiplanar, multi-echo pulse sequences of the brain and surrounding structures were acquired without intravenous contrast. Angiographic images of the Circle of Willis were acquired using MRA technique without intravenous contrast. COMPARISON:  CTA from earlier the same day. FINDINGS: MRI HEAD FINDINGS Brain: Cerebral volume within normal limits for age. Patchy T2/FLAIR hyperintensity involving the periventricular deep white matter both cerebral hemispheres as well as the pons, most consistent with chronic small vessel ischemic disease, moderate in nature. Scattered areas of restricted diffusion involving the cortical and subcortical aspect of the right frontal, parietal, and occipital lobes, consistent with acute ischemic infarcts. These are positioned in a linear watershed distribution. There are similar but less pronounced watershed type infarcts involving the contralateral left cerebral hemisphere, with involvement of the left frontal and parietal lobes. No associated hemorrhage or mass effect. No other evidence for acute or chronic intracranial hemorrhage. No mass lesion, midline shift or  mass effect. No hydrocephalus or extra-axial fluid collection. Pituitary gland suprasellar region normal. Midline structures intact. Vascular: Major intracranial vascular flow voids are maintained. Skull and upper cervical spine: Prominent osteoarthritic changes with degenerative thickening noted about the C1-2 articulation and tectorial membrane. No significant stenosis at the craniocervical junction. Bone marrow signal intensity within normal limits. No scalp soft tissue abnormality. Sinuses/Orbits: Globes and orbital soft tissues within normal limits. Paranasal sinuses are clear. No mastoid effusion. Inner ear structures grossly normal. Other: None. MRA HEAD FINDINGS  Anterior circulation: Examination degraded by motion artifact. Visualized distal cervical segments of the internal carotid arteries are patent with antegrade flow. Petrous, cavernous, and supraclinoid segments patent without hemodynamically significant stenosis. 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 A1 segments patent. Normal anterior communicating artery complex. Anterior cerebral arteries patent without significant stenosis. No M1 stenosis or occlusion. Normal MCA bifurcations. Distal MCA branches perfused and symmetric. Distal small vessel atheromatous irregularity, better evaluated on prior CTA. Posterior circulation: Vertebral arteries widely patent to the vertebrobasilar junction. Right vertebral artery dominant. Both PICA origins patent and normal. Basilar patent to its distal aspect without stenosis. Superior cerebellar arteries patent bilaterally. Left PCA supplied via the basilar. Right PCA supplied via a hypoplastic right P1 segment and robust right posterior communicating artery. Both PCAs remain patent to their distal aspects without appreciable stenosis. Distal small vessel atheromatous irregularity. Anatomic variants: Hypoplastic right P1 segment and robust right posterior communicating artery. IMPRESSION: MRI HEAD IMPRESSION: 1. 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 No associated hemorrhage or mass effect. 2. Underlying moderately advanced chronic microvascular ischemic disease. MRA HEAD IMPRESSION: 1. Negative intracranial MRA for large vessel occlusion. No hemodynamically significant or correctable stenosis. 2. Distal small vessel atheromatous irregularity, better evaluated on prior CTA. 3. 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 Electronically Signed   By: Jeannine Boga M.D.    On: 12/04/2020 21:22   DG Chest Port 1 View  Result Date: 12/04/2020 CLINICAL DATA:  Dyspnea EXAM: PORTABLE CHEST 1 VIEW COMPARISON:  None. Findings are correlated with CT examination of the abdomen pelvis of 12/02/2020 FINDINGS: Lung volumes are small. No pneumothorax or pleural effusion. Right paratracheal soft tissue thickening may reflect vascular shadow on this rotated examination, however, a paramediastinal soft tissue mass or adenopathy could appear similarly. This is not well characterized on this examination. Cardiac size is within normal limits. Opacity within the right cardiophrenic angle may relate to the patient's known large hiatal hernia. Nasogastric tube appears looped within the inferior thorax, likely within the known hiatal hernia. IMPRESSION: Pulmonary hypoinflation. Nasogastric tube looped within the inferior thorax, likely within known large hiatal hernia. Right paratracheal soft tissue opacity, not well characterized, possibly artifactual and related to rotation. This would be better assessed with a standard two view chest radiograph or CT examination when the patient is clinically able to do so. Electronically Signed   By: Fidela Salisbury M.D.   On: 12/04/2020 21:41      Flora Lipps, MD  [facility] 12/06/2020  If 7PM-7AM, please contact night-coverage

## 2020-12-06 NOTE — Progress Notes (Signed)
Right knee noted to have deformity when SCD removed. RLE warm to touch.  Dorsalis PP 1+.  MD aware and at bedside to assess.  Order received for STAT x-ray.

## 2020-12-06 NOTE — Progress Notes (Signed)
PT Cancellation Note  Patient Details Name: [name] MRN: [id number] DOB: 1940-04-24   Cancelled Treatment:    Reason Eval/Treat Not Completed: Patient at procedure or test/unavailable Entered pt's room with nurse present with concerns regarding pt's R knee. Pt currently undergoing portable x-ray in room for R knee. Attempted to rouse patient for re-evaluation however, continues to have difficulty with maintaining alertness. Will re-attempt tomorrow for re-evaluation.  Andrey Campanile, SPT   Andrey Campanile 12/06/2020, 2:02 PM

## 2020-12-06 NOTE — Care Management Important Message (Signed)
Important Message  Patient Details  Name: [name] MRN: [id number] Date of Birth: 1940-10-13   Medicare Important Message Given:  Yes     Loann Quill 12/06/2020, 12:42 PM

## 2020-12-06 NOTE — Progress Notes (Addendum)
Subjective:  CC: [name] is a 80 y.o. female  Hospital stay day 4, 4 Days Post-Op strangulated right femoral hernia repair with mesh, small bowel resection  HPI: No acute issues overnight. Transferred to floor.  She opened her eyes to verbal greeting, but did not answer my questions today.  ROS:  Unable to obtain secondary to pt condition  Objective:   Temp:  [97.7 F (36.5 C)-98.9 F (37.2 C)] 97.7 F (36.5 C) (09/26 1100) Pulse Rate:  [71-83] 71 (09/26 1207) Resp:  [15-24] 20 (09/26 1100) BP: (144-199)/(43-95) 148/43 (09/26 1207) SpO2:  [95 %-100 %] 98 % (09/26 1207)     Height: 5\' 3"  (160 cm) Weight: 76.7 kg BMI (Calculated): 29.96   Intake/Output this shift:   Intake/Output Summary (Last 24 hours) at 12/06/2020 1433 Last data filed at 12/06/2020 1000 Gross per 24 hour  Intake 1186.83 ml  Output 1225 ml  Net -38.17 ml    Constitutional :  no distress and slowed mentation  Respiratory:  clear to auscultation bilaterally  Cardiovascular:  regular rate and rhythm  Gastrointestinal: Soft, no guarding, no TTP in abdomen.  Small bruising around incision site in right groin improving from previous exam, remains soft, staples c/d/I.  NG with scant thin output   Skin: Cool and moist.   Psychiatric:  non-agitated, not confused  Extremity: Obviously dislocated right knee, with minimal TTP.  Intact DP on right, warm touch.  Pt unable to follow commands at time of exam to for neuro check in foot.    LABS:  CMP Latest Ref Rng & Units 12/06/2020 12/05/2020 12/04/2020  Glucose 70 - 99 mg/dL 105(H) 100(H) 128(H)  BUN 8 - 23 mg/dL 35(H) 55(H) 48(H)  Creatinine 0.44 - 1.00 mg/dL 0.55 0.99 1.31(H)  Sodium 135 - 145 mmol/L 141 138 134(L)  Potassium 3.5 - 5.1 mmol/L 3.4(L) 3.4(L) 3.4(L)  Chloride 98 - 111 mmol/L 108 100 96(L)  CO2 22 - 32 mmol/L 24 24 25   Calcium 8.9 - 10.3 mg/dL 8.8(L) 9.1 8.6(L)  Total Protein 6.5 - 8.1 g/dL - - -  Total Bilirubin 0.3 - 1.2 mg/dL - - -   Alkaline Phos 38 - 126 U/L - - -  AST 15 - 41 U/L - - -  ALT 0 - 44 U/L - - -   CBC Latest Ref Rng & Units 12/06/2020 12/05/2020 12/04/2020  WBC 4.0 - 10.5 K/uL 9.8 9.8 11.6(H)  Hemoglobin 12.0 - 15.0 g/dL 11.2(L) 11.6(L) 10.4(L)  Hematocrit 36.0 - 46.0 % 33.6(L) 35.3(L) 29.8(L)  Platelets 150 - 400 K/uL 219 201 184    RADS: N/a Assessment:   S/p strangulated right femoral hernia repair with mesh, small bowel resection. No report of BM or flatus yet, but NG has only minimal output, and abdominal exam benign.  Will try NG clamp trial and if minimal residual, will remove NG.  No resuming oral feeds until formal speech assessment is completed due to recent stroke.  Right knee dislocation- Dr. Harlow Mares, who performed R TKA notified immediately after exam.  Recommended Xray for eval initially.  Further care per ortho team.  Hospitalist notified as well.  Stroke- primary care of patient transferred to hospitalist service as of yesterday.  further care and disposition per primary hospitalist team.  Surgery will continue to follow as consultant.  Spoke with daughter Caryl Pina about recent developments. Discussed plan re: NG and she verbalized understanding. Re: dislocation, I explained to her at this point, there has been no specific  reports or concerns about her right knee until I examined her. I recommended she discuss the plans regarding care of her knee with Dr. Harlow Mares and/or the hospitalist team for a informed risk vs benefit proceeding with any additional procedures requiring sedation to address the dislocation.  We also briefly discussed what an ischemic stroke is, and possible causes of it, such as episodes of hypotension.  All questions addressed at this time.

## 2020-12-06 NOTE — Progress Notes (Signed)
Patient remains drowsy and lethargic, opening eyes occ on command.  Oriented to self and place only.  VSS. O2 weaned to RA. Remains NPO.  IVF @ 67ml. NGT OP 25ml this shift.  Order received to clamp NGT. Foley maintained.  Right knee x-ray done results dislocation. Ortho consulted.  Palliative care consult pending.  Continue hourly safety rounds.

## 2020-12-07 ENCOUNTER — Encounter
Admission: EM | Disposition: A | Discharge status: Skilled Nursing Facility | Payer: Self-pay | Source: Home / Self Care | Attending: Internal Medicine

## 2020-12-07 ENCOUNTER — Inpatient Hospital Stay: Payer: Medicare PPO

## 2020-12-07 ENCOUNTER — Inpatient Hospital Stay: Payer: Medicare PPO | Admitting: Anesthesiology

## 2020-12-07 ENCOUNTER — Encounter: Payer: Self-pay | Admitting: General Surgery

## 2020-12-07 DIAGNOSIS — S83114A Anterior dislocation of proximal end of tibia, right knee, initial encounter: Secondary | ICD-10-CM

## 2020-12-07 DIAGNOSIS — F418 Other specified anxiety disorders: Secondary | ICD-10-CM | POA: Diagnosis not present

## 2020-12-07 DIAGNOSIS — D649 Anemia, unspecified: Secondary | ICD-10-CM | POA: Diagnosis not present

## 2020-12-07 DIAGNOSIS — K4030 Unilateral inguinal hernia, with obstruction, without gangrene, not specified as recurrent: Secondary | ICD-10-CM | POA: Diagnosis not present

## 2020-12-07 DIAGNOSIS — E785 Hyperlipidemia, unspecified: Secondary | ICD-10-CM | POA: Diagnosis not present

## 2020-12-07 HISTORY — PX: KNEE CLOSED REDUCTION: SHX995

## 2020-12-07 LAB — CBC
HCT: 32.7 % — ABNORMAL LOW (ref 36.0–46.0)
Hemoglobin: 11 g/dL — ABNORMAL LOW (ref 12.0–15.0)
MCH: 29.9 pg (ref 26.0–34.0)
MCHC: 33.6 g/dL (ref 30.0–36.0)
MCV: 88.9 fL (ref 80.0–100.0)
Platelets: 262 10*3/uL (ref 150–400)
RBC: 3.68 MIL/uL — ABNORMAL LOW (ref 3.87–5.11)
RDW: 15.1 % (ref 11.5–15.5)
WBC: 10.2 10*3/uL (ref 4.0–10.5)
nRBC: 0 % (ref 0.0–0.2)

## 2020-12-07 LAB — BASIC METABOLIC PANEL
Anion gap: 13 (ref 5–15)
BUN: 27 mg/dL — ABNORMAL HIGH (ref 8–23)
CO2: 20 mmol/L — ABNORMAL LOW (ref 22–32)
Calcium: 8.9 mg/dL (ref 8.9–10.3)
Chloride: 108 mmol/L (ref 98–111)
Creatinine, Ser: 0.58 mg/dL (ref 0.44–1.00)
GFR, Estimated: >60 mL/min (ref >60)
Glucose, Bld: 93 mg/dL (ref 70–99)
Potassium: 3.4 mmol/L — ABNORMAL LOW (ref 3.5–5.1)
Sodium: 141 mmol/L (ref 135–145)

## 2020-12-07 LAB — MAGNESIUM: Magnesium: 1.8 mg/dL (ref 1.7–2.4)

## 2020-12-07 LAB — GLUCOSE, CAPILLARY: Glucose-Capillary: 106 mg/dL — ABNORMAL HIGH (ref 70–99)

## 2020-12-07 SURGERY — MANIPULATION, KNEE, CLOSED
Anesthesia: Monitor Anesthesia Care | Site: Knee | Laterality: Right

## 2020-12-07 MED ORDER — DOCUSATE SODIUM 100 MG PO CAPS
100.0000 mg | ORAL_CAPSULE | Freq: Two times a day (BID) | ORAL | Status: DC
  Administered 2020-12-07 – 2020-12-11 (×6): 100 mg via ORAL
  Filled 2020-12-07 (×6): qty 1

## 2020-12-07 MED ORDER — SODIUM CHLORIDE 0.9 % IV SOLN: INTRAVENOUS | Status: DC | PRN

## 2020-12-07 MED ORDER — ONDANSETRON HCL 4 MG PO TABS: 4.0000 mg | ORAL_TABLET | Freq: Four times a day (QID) | ORAL | Status: DC | PRN

## 2020-12-07 MED ORDER — METOCLOPRAMIDE HCL 5 MG/ML IJ SOLN: 5.0000 mg | Freq: Three times a day (TID) | INTRAMUSCULAR | Status: DC | PRN

## 2020-12-07 MED ORDER — PROPOFOL 10 MG/ML IV BOLUS
INTRAVENOUS | Status: DC | PRN
  Administered 2020-12-07: 50 mg via INTRAVENOUS

## 2020-12-07 MED ORDER — POTASSIUM CHLORIDE 10 MEQ/100ML IV SOLN
10.0000 meq | INTRAVENOUS | Status: AC
  Administered 2020-12-07 (×4): 10 meq via INTRAVENOUS
  Filled 2020-12-07 (×4): qty 100

## 2020-12-07 MED ORDER — FENTANYL CITRATE PF 50 MCG/ML IJ SOSY
PREFILLED_SYRINGE | INTRAMUSCULAR | Status: AC
  Filled 2020-12-07: qty 1

## 2020-12-07 MED ORDER — ONDANSETRON HCL 4 MG/2ML IJ SOLN: 4.0000 mg | Freq: Four times a day (QID) | INTRAMUSCULAR | Status: DC | PRN

## 2020-12-07 MED ORDER — METOCLOPRAMIDE HCL 5 MG PO TABS: 5.0000 mg | ORAL_TABLET | Freq: Three times a day (TID) | ORAL | Status: DC | PRN

## 2020-12-07 MED ORDER — SEVOFLURANE IN SOLN
RESPIRATORY_TRACT | Status: AC
  Filled 2020-12-07: qty 250

## 2020-12-07 MED ORDER — LIDOCAINE HCL (CARDIAC) PF 100 MG/5ML IV SOSY
PREFILLED_SYRINGE | INTRAVENOUS | Status: DC | PRN
  Administered 2020-12-07: 50 mg via INTRAVENOUS

## 2020-12-07 MED ORDER — PROPOFOL 10 MG/ML IV BOLUS
INTRAVENOUS | Status: AC
  Filled 2020-12-07: qty 20

## 2020-12-07 MED ORDER — FENTANYL CITRATE PF 50 MCG/ML IJ SOSY
50.0000 ug | PREFILLED_SYRINGE | Freq: Once | INTRAMUSCULAR | Status: AC
  Administered 2020-12-07: 50 ug via INTRAVENOUS

## 2020-12-07 SURGICAL SUPPLY — 35 items
APL PRP STRL LF DISP 70% ISPRP (MISCELLANEOUS)
BNDG COHESIVE 4X5 TAN ST LF (GAUZE/BANDAGES/DRESSINGS) ×1 IMPLANT
BNDG GAUZE ELAST 4 BULKY (GAUZE/BANDAGES/DRESSINGS) ×1 IMPLANT
BRUSH SCRUB EZ  4% CHG (MISCELLANEOUS)
BRUSH SCRUB EZ 4% CHG (MISCELLANEOUS) ×2 IMPLANT
CHLORAPREP W/TINT 26 (MISCELLANEOUS) ×2 IMPLANT
DRAPE 3/4 80X56 (DRAPES) IMPLANT
DRAPE INCISE IOBAN 66X60 STRL (DRAPES) ×1 IMPLANT
DRAPE SURG 17X11 SM STRL (DRAPES) IMPLANT
DRAPE U-SHAPE 47X51 STRL (DRAPES) IMPLANT
ELECT REM PT RETURN 9FT ADLT (ELECTROSURGICAL)
ELECTRODE REM PT RTRN 9FT ADLT (ELECTROSURGICAL) ×1 IMPLANT
GAUZE 4X4 16PLY ~~LOC~~+RFID DBL (SPONGE) ×1 IMPLANT
GLOVE SURG ORTHO LTX SZ8 (GLOVE) ×1 IMPLANT
GLOVE SURG UNDER LTX SZ8 (GLOVE) ×1 IMPLANT
GOWN STRL REUS W/ TWL LRG LVL3 (GOWN DISPOSABLE) ×1 IMPLANT
GOWN STRL REUS W/ TWL XL LVL3 (GOWN DISPOSABLE) ×1 IMPLANT
GOWN STRL REUS W/TWL LRG LVL3 (GOWN DISPOSABLE)
GOWN STRL REUS W/TWL XL LVL3 (GOWN DISPOSABLE)
IMMOB KNEE 24 THIGH 24 443303 (SOFTGOODS) ×1 IMPLANT
KIT TURNOVER KIT A (KITS) ×1 IMPLANT
MANIFOLD NEPTUNE II (INSTRUMENTS) ×1 IMPLANT
NS IRRIG 1000ML POUR BTL (IV SOLUTION) ×1 IMPLANT
PACK EXTREMITY ARMC (MISCELLANEOUS) ×1 IMPLANT
STAPLER SKIN PROX 35W (STAPLE) ×1 IMPLANT
STOCKINETTE IMPERVIOUS 9X36 MD (GAUZE/BANDAGES/DRESSINGS) ×1 IMPLANT
SUT ETHILON 2 0 FSLX (SUTURE) IMPLANT
SUT ETHILON NAB PS2 4-0 18IN (SUTURE) IMPLANT
SUT PDS AB 2-0 CT1 27 (SUTURE) IMPLANT
SUT VIC AB 1 CT1 36 (SUTURE) IMPLANT
SUT VIC AB 2-0 CT1 27 (SUTURE)
SUT VIC AB 2-0 CT1 TAPERPNT 27 (SUTURE) IMPLANT
SUT VICRYL AB 3-0 FS1 BRD 27IN (SUTURE) IMPLANT
TOWEL OR 17X26 4PK STRL BLUE (TOWEL DISPOSABLE) ×1 IMPLANT
WATER STERILE IRR 500ML POUR (IV SOLUTION) ×1 IMPLANT

## 2020-12-07 NOTE — Progress Notes (Addendum)
Subjective:  CC: [name] is a 80 y.o. female  Hospital stay day 5, 5 Days Post-Op strangulated right femoral hernia repair with mesh, small bowel resection  HPI: No acute issues overnight. Able to answer yes/no questions today.  Cannot recall when she last had BM.  States she feels like she needs to go.  Denies any pain  ROS:  Unable to obtain secondary to pt condition  Objective:   Temp:  [98.3 F (36.8 C)-99.1 F (37.3 C)] 98.3 F (36.8 C) (09/27 1202) Pulse Rate:  [70-86] 70 (09/27 1202) Resp:  [16-22] 20 (09/27 1202) BP: (162-187)/(48-78) 168/57 (09/27 1202) SpO2:  [92 %-96 %] 96 % (09/27 1202)     Height: 5\' 3"  (160 cm) Weight: 76.7 kg BMI (Calculated): 29.96   Intake/Output this shift:   Intake/Output Summary (Last 24 hours) at 12/07/2020 1350 Last data filed at 12/07/2020 1044 Gross per 24 hour  Intake 2198.39 ml  Output 1275 ml  Net 923.39 ml    Constitutional :  no distress and slowed mentation  Respiratory:  clear to auscultation bilaterally  Cardiovascular:  regular rate and rhythm  Gastrointestinal: Soft, no guarding, no TTP in abdomen.  Small bruising around incision site in right groin improving from previous exam, remains soft, staples c/d/I.    Skin: Cool and moist.   Psychiatric:  non-agitated, not confused  Extremity: Obviously dislocated right knee, with minimal TTP.  Intact DP on right, warm touch.  Able to move bilateral feet and right arm, but 0/5 in entire left arm.  Sensation intact all extremities.    LABS:  CMP Latest Ref Rng & Units 12/07/2020 12/06/2020 12/05/2020  Glucose 70 - 99 mg/dL 93 105(H) 100(H)  BUN 8 - 23 mg/dL 27(H) 35(H) 55(H)  Creatinine 0.44 - 1.00 mg/dL 0.58 0.55 0.99  Sodium 135 - 145 mmol/L 141 141 138  Potassium 3.5 - 5.1 mmol/L 3.4(L) 3.4(L) 3.4(L)  Chloride 98 - 111 mmol/L 108 108 100  CO2 22 - 32 mmol/L 20(L) 24 24  Calcium 8.9 - 10.3 mg/dL 8.9 8.8(L) 9.1  Total Protein 6.5 - 8.1 g/dL - - -  Total Bilirubin  0.3 - 1.2 mg/dL - - -  Alkaline Phos 38 - 126 U/L - - -  AST 15 - 41 U/L - - -  ALT 0 - 44 U/L - - -   CBC Latest Ref Rng & Units 12/07/2020 12/06/2020 12/05/2020  WBC 4.0 - 10.5 K/uL 10.2 9.8 9.8  Hemoglobin 12.0 - 15.0 g/dL 11.0(L) 11.2(L) 11.6(L)  Hematocrit 36.0 - 46.0 % 32.7(L) 33.6(L) 35.3(L)  Platelets 150 - 400 K/uL 262 219 201    RADS: N/a Assessment:   S/p strangulated right femoral hernia repair with mesh, small bowel resection. No report of BM or flatus yet, but NG with zero ouput for past 24hrs and abdomen remains soft.  Pulled NG tube and will obtain KUB to assess for distention prior to resuming diet after closed reduction of her knee later today with ortho. Foley can be removed post op as well from surgery standpoint.  Stroke- primary care of patient transferred to hospitalist service as of yesterday.  further care and disposition per primary hospitalist team.  Unsure if left arm deficit is new or is not evident until now that she is attempting to follow commands.   Above plan discussed with hospitalist, RN, and ortho.  All verbalized understanding

## 2020-12-07 NOTE — Progress Notes (Addendum)
PROGRESS NOTE  [name] BJS:[id number] DOB: 1940-12-25 DOA: 12/02/2020 PCP: Jon Billings, NP   LOS: 5 days   Brief narrative:  [name] is an 80 y.o. female with a past medical history of hypertension, hyperlipidemia, prediabetes, depression with anxiety, schizoaffective disorder, anemia, skin cancer, was admitted by the surgical team for strangulated inguinal hernia.  Status post surgery patient had acute stroke appeared to be obtunded.  Code stroke was called in.  MRI showed watershed type infarcts in bilateral cerebral hemispheres.  Neurology was consulted.  PCCM recommended palliative care consultation.  Patient was then put in hospitalist service for further evaluation and plan.  During hospitalization, patient's alertness improved and was seen by speech therapy.  Has been recommended pured diet.  She was also noted to have dislocation of the right knee and orthopedics has been consulted for reduction of the knee today.    Assessment/Plan:  Principal Problem:   Strangulated inguinal hernia Active Problems:   Anemia   Hypertension   Stroke ([place])   HLD (hyperlipidemia)   Depression with anxiety   Coma ([place])  Strangulated inguinal hernia:  Status post  right femoral hernia repair with mesh and small bowel resection on 12/02/2020.  IV Zosyn.  On IV fluids since patient is NPO.  Management as per primary team.  Bilateral cerebral hemispheric watershed infarctions likely secondary to hypotension resulting in coma:  Patient is more alert awake and mildly communicative.  Neurology did not recommend aspirin due to watershed infarct from hypertension.  As per neurology, the goal is to keep blood pressure between 130 and 180.  Patient has been seen by speech therapy recommended pure 1 diet.  Still has NG tube in place.  Awaiting for surgical intervention so patient will be n.p.o. for now.  Anterior dislocation of right knee with pain..  Orthopedics on board.   Would suggest light sedation.  Avoid hypotension during sedation.  Spoke with the patient's family at bedside regarding reduction of dislocation.  Spoke with the orthopedic surgeon at bedside.  Anemia-normocytic anemia:  Latest hemoglobin of 11.0.  Will need to monitor closely.  Hypokalemia.  We will replenish through IV.  Check BMP in AM.   Essential hypertension Systolic blood pressure goal between 130-180.  Continue to monitor closely.  On IV hydralazine and Coreg when p.o. okay.   Hyperlipidemia -Lipitor when able to take   Depression with anxiety -Continue home medications when able to take Patricia meds  DVT prophylaxis: enoxaparin (LOVENOX) injection 40 mg Start: 12/03/20 0800 SCD's Start: 12/03/20 0323   Code Status: DNR  Family Communication:  Spoke with the patient's daughters at side at length and updated him about the clinical condition of the patient.  Status is: Inpatient  Remains inpatient appropriate because:Unsafe d/c plan, IV treatments appropriate due to intensity of illness or inability to take PO, Inpatient level of care appropriate due to severity of illness, and palliative care consultation , orthopedic intervention  Dispo: The patient is from: Home              Anticipated d/c is to:  Undetermined at this time , will get PT evaluation after the reduction, will likely need skilled nursing facility.              Patient currently is not medically stable to d/c.   Difficult to place patient No   Consultants: Palliative care PCCM General surgery Orthopedics  Procedures: Hernia repair and small bowel resection on 12/02/2020.   Anti-infectives:  Zosyn IV 9/22>  Subjective:  Today, patient was seen and examined at bedside.  Alert awake and oriented to self and place.  Complains of pain in the knee  Objective: Vitals:   12/07/20 0754 12/07/20 1202  BP: (!) 185/64 (!) 168/57  Pulse: 86 70  Resp: 20 20  Temp: 98.6 F (37 C) 98.3 F (36.8 C)  SpO2:  93% 96%    Intake/Output Summary (Last 24 hours) at 12/07/2020 1229 Last data filed at 12/07/2020 1044 Gross per 24 hour  Intake 2198.39 ml  Output 1275 ml  Net 923.39 ml    Filed Weights   12/04/20 0756 12/04/20 2111  Weight: 82.5 kg 76.7 kg   Body mass index is 29.95 kg/m.   Physical Exam:  GENERAL: Patient is alert awake, oriented to self and place, not in obvious distress.  NG tube in place,  HENT: No scleral pallor or icterus. Pupils equally reactive to light. Patricia mucosa is moist NECK: is supple, no gross swelling noted. CHEST:   Diminished breath sounds bilaterally. CVS: S1 and S2 heard, no murmur. Regular rate and rhythm.  ABDOMEN: Soft, non-tender, bowel sounds are present.  External Foley catheter in place  EXTREMITIES: No edema. Right knee anterior dislocation. CNS: 1/5 power lower extremities, flaccid  weakness of bilateral upper extremities, slurred speech. SKIN: warm and dry without rashes.  Data Review: I have personally reviewed the following laboratory data and studies,  CBC: Recent Labs  Lab 12/03/20 0558 12/04/20 0535 12/05/20 0636 12/06/20 0430 12/07/20 0346  WBC 14.4* 11.6* 9.8 9.8 10.2  HGB 12.1 10.4* 11.6* 11.2* 11.0*  HCT 34.4* 29.8* 35.3* 33.6* 32.7*  MCV 86.0 87.4 88.3 90.8 88.9  PLT 216 184 201 219 505    Basic Metabolic Panel: Recent Labs  Lab 12/03/20 0558 12/04/20 0535 12/05/20 0636 12/06/20 0430 12/07/20 0346  NA 134* 134* 138 141 141  K 3.6 3.4* 3.4* 3.4* 3.4*  CL 97* 96* 100 108 108  CO2 23 25 24 24  20*  GLUCOSE 155* 128* 100* 105* 93  BUN 35* 48* 55* 35* 27*  CREATININE 0.60 1.31* 0.99 0.55 0.58  CALCIUM 9.0 8.6* 9.1 8.8* 8.9  MG  --   --  2.2  --  1.8  PHOS  --   --  3.5  --   --     Liver Function Tests: Recent Labs  Lab 12/02/20 1232  AST 33  ALT 16  ALKPHOS 57  BILITOT 2.1*  PROT 8.1  ALBUMIN 4.8    Recent Labs  Lab 12/02/20 1232  LIPASE 25    No results for input(s): AMMONIA in the last 168  hours. Cardiac Enzymes: No results for input(s): CKTOTAL, CKMB, CKMBINDEX, TROPONINI in the last 168 hours. BNP (last 3 results) No results for input(s): BNP in the last 8760 hours.  ProBNP (last 3 results) No results for input(s): PROBNP in the last 8760 hours.  CBG: Recent Labs  Lab 12/04/20 1204 12/04/20 2112 12/04/20 2337 12/05/20 0804  GLUCAP 99 87 86 103*    Recent Results (from the past 240 hour(s))  Resp Panel by RT-PCR (Flu A&B, Covid) Nasopharyngeal Swab     Status: None   Collection Time: 12/02/20  4:39 PM   Specimen: Nasopharyngeal Swab; Nasopharyngeal(NP) swabs in vial transport medium  Result Value Ref Range Status   SARS Coronavirus 2 by RT PCR NEGATIVE NEGATIVE Final    Comment: (NOTE) SARS-CoV-2 target nucleic acids are NOT DETECTED.  The SARS-CoV-2 RNA is  generally detectable in upper respiratory specimens during the acute phase of infection. The lowest concentration of SARS-CoV-2 viral copies this assay can detect is 138 copies/mL. A negative result does not preclude SARS-Cov-2 infection and should not be used as the sole basis for treatment or other patient management decisions. A negative result may occur with  improper specimen collection/handling, submission of specimen other than nasopharyngeal swab, presence of viral mutation(s) within the areas targeted by this assay, and inadequate number of viral copies(<138 copies/mL). A negative result must be combined with clinical observations, patient history, and epidemiological information. The expected result is Negative.  Fact Sheet for Patients:  EntrepreneurPulse.com.au  Fact Sheet for Healthcare Providers:  IncredibleEmployment.be  This test is no t yet approved or cleared by the [place] FDA and  has been authorized for detection and/or diagnosis of SARS-CoV-2 by FDA under an Emergency Use Authorization (EUA). This EUA will remain  in effect (meaning  this test can be used) for the duration of the COVID-19 declaration under Section 564(b)(1) of the Act, 21 U.S.C.section 360bbb-3(b)(1), unless the authorization is terminated  or revoked sooner.       Influenza A by PCR NEGATIVE NEGATIVE Final   Influenza B by PCR NEGATIVE NEGATIVE Final    Comment: (NOTE) The Xpert Xpress SARS-CoV-2/FLU/RSV plus assay is intended as an aid in the diagnosis of influenza from Nasopharyngeal swab specimens and should not be used as a sole basis for treatment. Nasal washings and aspirates are unacceptable for Xpert Xpress SARS-CoV-2/FLU/RSV testing.  Fact Sheet for Patients: EntrepreneurPulse.com.au  Fact Sheet for Healthcare Providers: IncredibleEmployment.be  This test is not yet approved or cleared by the [place] FDA and has been authorized for detection and/or diagnosis of SARS-CoV-2 by FDA under an Emergency Use Authorization (EUA). This EUA will remain in effect (meaning this test can be used) for the duration of the COVID-19 declaration under Section 564(b)(1) of the Act, 21 U.S.C. section 360bbb-3(b)(1), unless the authorization is terminated or revoked.  Performed at [facility], [address] [place], [place] [address]   MRSA Next Gen by PCR, Nasal     Status: None   Collection Time: 12/04/20  9:05 PM   Specimen: Nasal Mucosa; Nasal Swab  Result Value Ref Range Status   MRSA by PCR Next Gen NOT DETECTED NOT DETECTED Final    Comment: (NOTE) The GeneXpert MRSA Assay (FDA approved for NASAL specimens only), is one component of a comprehensive MRSA colonization surveillance program. It is not intended to diagnose MRSA infection nor to guide or monitor treatment for MRSA infections. Test performance is not FDA approved in patients less than 74 years old. Performed at [facility], [address] [place], [place] [address]       Studies: DG Knee 1-2 Views  Right  Result Date: 12/06/2020 CLINICAL DATA:  RIGHT knee dislocation EXAM: RIGHT KNEE - 1-2 VIEW COMPARISON:  None. FINDINGS: Lateral projection demonstrates anterior dislocation of the femoral component in relation to the tibial component. No evidence of fracture. IMPRESSION: Anterior dislocation the knee joint. Electronically Signed   By: Suzy Bouchard M.D.   On: 12/06/2020 14:46      Flora Lipps, MD  [facility] 12/07/2020  If 7PM-7AM, please contact night-coverage

## 2020-12-07 NOTE — Op Note (Signed)
PRE-OPERATIVE DIAGNOSIS:  Right Total Knee Dislocation  POST-OPERATIVE DIAGNOSIS:  Same  PROCEDURE:  CLOSED MANIPULATION KNEE, RIGHT  SURGEON:  Lovell Sheehan, MD   ANESTHESIA:   IV sedation  PREOPERATIVE INDICATIONS:  [name] is a  80 y.o. female with a diagnosis of Right Total Knee Dislocation who failed conservative measures and elected for surgical management.    The risks benefits and alternatives were discussed with the patient preoperatively including but not limited to the risks of infection, bleeding, nerve injury, cardiopulmonary complications, the need for revision surgery, among others, and the patient's family was willing to proceed.  OPERATIVE IMPLANTS: None  OPERATIVE FINDINGS: Posterior dislocation of right knee  EBL: None  COMPLICATIONS:   None  OPERATIVE PROCEDURE: The patient underwent satisfactory IV sedation in the supine position.  The knee was locked at 90 degrees of flexion.  Using manual traction directed distally and anteriorly the knee was reduced.  Range of motion was stable.  There was laxity noted to posterior drawer testing. A knee immobilizer was applied to prevent knee flexion.  The patient was awakened and taken to recovery in good condition.   Elyn Aquas. Harlow Mares

## 2020-12-07 NOTE — Progress Notes (Signed)
OT Cancellation Note  Patient Details Name: [name] MRN: [id number] DOB: Sep 12, 1940   Cancelled Treatment:    Reason Eval/Treat Not Completed: Other (comment). Upon attempt, staff entered room to take pt to the OR for surgical repair of R knee dislocation. Will hold OT evaluation this date and re-attempt OT re-evaluation at later date/time as medically appropriate.   Ardeth Perfect., MPH, MS, OTR/L ascom [phone] 12/07/20, 2:28 PM

## 2020-12-07 NOTE — Evaluation (Signed)
Physical Therapy Re-Evaluation Patient Details Name: [name] MRN: [id number] DOB: [birth year] Today's Date: 12/07/2020  History of Present Illness  Pt admitted for strangulated inguinal hernia with complaints of N/V and abdominal pain. History includes anemia, HLD, and OA. She is now s/p R femoral hernia repair with mesh. Pt initially evaluated by PT on 9/23. On 9/24 pt had a code stroke called and imaging revealed B watershead infarcts R>L per neurology. 9/26 pt found to have R knee posteriorly subluxed and updated WB status to NWB on R LE with ortho following.   Clinical Impression  Pt is a pleasant 80 year old female who presents to PT re-evaluation after code stroke on 9/24 with B watershed infarcts per neurology. Pt was previously independent with usage of assistive devices and ambulated with PT per previous PT evaluation completed 9/23. Currently, pt is requiring totalA + 2 with bed mobility and requires intermittent minA for sitting balance at EOB for correction of L lateral and posterior lean. Pt also found to have R knee posterior subluxation and currently NWB on R LE. Pt demonstrates deficits of impaired cognition, L inattention, L UE flaccidity, increased L hip adductor tone, decreased R and L LE strength, coordination, and sensation. Recommending SNF placement at this time due to severity of deficits. Pt will benefit from skilled PT services to address deficits listed above. Will continue to update care team with patient progress.      Recommendations for follow up therapy are one component of a multi-disciplinary discharge planning process, led by the attending physician.  Recommendations may be updated based on patient status, additional functional criteria and insurance authorization.  Follow Up Recommendations SNF;Supervision/Assistance - 24 hour    Equipment Recommendations  Other (comment) (TBD by next level of care)    Recommendations for Other Services        Precautions / Restrictions Precautions Precautions: Fall Restrictions Weight Bearing Restrictions: Yes RLE Weight Bearing: Non weight bearing      Mobility  Bed Mobility Overal bed mobility: Needs Assistance Bed Mobility: Supine to Sit;Sit to Supine     Supine to sit: Total assist;+2 for physical assistance Sit to supine: Total assist;+2 for physical assistance   General bed mobility comments: Assistance with trunk elevation and second person for management of B LEs for  supine <> sit. Required totalA + 2 for repositioning in towards [facility] with patient in trendelenburg position.    Transfers    General transfer comment: Deferred at this time due to patient status and R NWB status  Ambulation/Gait      General Gait Details: Deferred at this time due to patient status and R NWB status  Stairs            Wheelchair Mobility    Modified Rankin (Stroke Patients Only)       Balance   Sitting-balance support: Single extremity supported;Feet supported Sitting balance-Leahy Scale: Fair Sitting balance - Comments: Posterior and L lean with seated assessment requiring intermittent minA for correction            Pertinent Vitals/Pain Pain Assessment: Faces Faces Pain Scale: Hurts even more Pain Location: R knee Pain Descriptors / Indicators: Grimacing;Moaning Pain Intervention(s): Limited activity within patient's tolerance;Monitored during session;Repositioned;Utilized relaxation techniques    Home Living Family/patient expects to be discharged to:: Private residence Living Arrangements: Alone   Type of Home: House Home Access: Level entry     Home Layout: One level Home Equipment: Environmental consultant - 2 wheels Additional Comments: Information  obtained from previous PT evaluation on 9/23    Prior Function Level of Independence: Independent with assistive device(s)         Comments: uses RW for all mobility     Hand Dominance        Extremity/Trunk  Assessment   Upper Extremity Assessment Upper Extremity Assessment: Generalized weakness;LUE deficits/detail;RUE deficits/detail;Difficult to assess due to impaired cognition RUE Deficits / Details: Decreased ability to follow commands and decreased shoulder movement. Able to move into elbow flexion against gravity LUE Deficits / Details: Pt presenting with flaccid L UE and decreased attention towards L body    Lower Extremity Assessment Lower Extremity Assessment: Generalized weakness;RLE deficits/detail;LLE deficits/detail;Difficult to assess due to impaired cognition RLE Deficits / Details: Able to move through partial ROM of R hip flexion, knee extension, and ankle DF/PF. In sitting, appearance of R knee was in anatomical position with decreased subluxation. Difficulty with formal testing due to decreased cognition LLE Deficits / Details: Pt unable to initial L hip flexion or L knee extension against gravity. Pt able to move through partial AROM of L ankle DF/PF. Difficulty to perform formal testing due to cognition    Cervical / Trunk Assessment Cervical / Trunk Assessment: Other exceptions Cervical / Trunk Exceptions: Pt unable to volitionally turn head towards L with verbal or tactile cues  Communication      Cognition Arousal/Alertness: Lethargic Behavior During Therapy: Flat affect Overall Cognitive Status: Impaired/Different from baseline Area of Impairment: Orientation;Following commands;Awareness      Orientation Level: Disoriented to;Time;Situation             General Comments: Pt only oriented to self and place only. Pt unable to recall CVA or R knee subluxation. Pt demonstrating some difficulty with following commands.      General Comments      Exercises Other Exercises Other Exercises: Pt repositioned at end of session with pillows under B UEs to reduce likelihood of subluxation, L hand towel roll, and pillows under B LEs to elevate heels from bed surface.  Additional pillow placed under R knee for patient comfort.   Assessment/Plan    PT Assessment Patient needs continued PT services  PT Problem List Decreased strength;Decreased range of motion;Decreased activity tolerance;Decreased balance;Decreased mobility;Decreased coordination;Decreased cognition;Decreased knowledge of use of DME;Decreased safety awareness;Decreased knowledge of precautions;Impaired tone;Decreased skin integrity;Pain;Impaired sensation       PT Treatment Interventions DME instruction;Functional mobility training;Therapeutic activities;Therapeutic exercise;Balance training;Neuromuscular re-education;Cognitive remediation;Patient/family education;Wheelchair mobility training;Modalities;Gait training    PT Goals (Current goals can be found in the Care Plan section)  Acute Rehab PT Goals Patient Stated Goal: to go home PT Goal Formulation: With patient Time For Goal Achievement: 12/17/20 Potential to Achieve Goals: Poor    Frequency Min 2X/week   Barriers to discharge        Co-evaluation               AM-PAC PT "6 Clicks" Mobility  Outcome Measure Help needed turning from your back to your side while in a flat bed without using bedrails?: Total Help needed moving from lying on your back to sitting on the side of a flat bed without using bedrails?: Total Help needed moving to and from a bed to a chair (including a wheelchair)?: Total Help needed standing up from a chair using your arms (e.g., wheelchair or bedside chair)?: Total Help needed to walk in hospital room?: Total Help needed climbing 3-5 steps with a railing? : Total 6 Click Score: 6  End of Session   Activity Tolerance: Patient tolerated treatment well Patient left: in bed;with call bell/phone within reach;with bed alarm set;with SCD's reapplied Nurse Communication: Mobility status PT Visit Diagnosis: Muscle weakness (generalized) (M62.81);Hemiplegia and hemiparesis;Other abnormalities of  gait and mobility (R26.89) Hemiplegia - Right/Left: Left Hemiplegia - caused by: Cerebral infarction Pain - Right/Left: Right Pain - part of body: Knee    Time: 3912-2583 PT Time Calculation (min) (ACUTE ONLY): 34 min   Charges:   PT Evaluation $PT Re-evaluation: 1 Re-eval PT Treatments $Therapeutic Activity: 8-22 mins        Andrey Campanile, SPT   Andrey Campanile 12/07/2020, 1:49 PM

## 2020-12-07 NOTE — Evaluation (Signed)
Clinical/Bedside Swallow Evaluation Patient Details  Name: [name] MRN: [id number] Date of Birth: 03-Apr-1940  Today's Date: 12/07/2020 Time: SLP Start Time (ACUTE ONLY): 0845 SLP Stop Time (ACUTE ONLY): 7371 SLP Time Calculation (min) (ACUTE ONLY): 50 min  Past Medical History:  Past Medical History:  Diagnosis Date   Anemia    Colon polyp    History of small bowel obstruction 9/09   likely due to adhesions- pt refused most dx and tx in hospital   History of vaginal bleeding    post menopausal- gyn eval, ? polyp   Hyperlipidemia    Osteoarthritis    knee   Schizoaffective disorder    paranoid personality- refuses treatment   Vitamin D deficiency    Past Surgical History:  Past Surgical History:  Procedure Laterality Date   ANTERIOR AND POSTERIOR REPAIR N/A 04/23/2012   Procedure: ANTERIOR   REPAIR CYSTOCELE;  Surgeon: Reece Packer, MD;  Location: [facility];  Service: Urology;  Laterality: N/A;  cysto;graft 10x6   APPENDECTOMY     bladder tack  1976   BOWEL RESECTION N/A 12/02/2020   Procedure: SMALL BOWEL RESECTION;  Surgeon: Benjamine Sprague, DO;  Location: [facility];  Service: General;  Laterality: N/A;   INGUINAL HERNIA REPAIR Right 12/02/2020   Procedure: HERNIA REPAIR INGUINAL ADULT;  Surgeon: Benjamine Sprague, DO;  Location: [facility];  Service: General;  Laterality: Right;   KNEE ARTHROSCOPY     right   KNEE ARTHROSCOPY Right    LAPAROSCOPY  1970's   twisted fallopian tube   SALPINGOOPHORECTOMY Bilateral 04/23/2012   Procedure: SALPINGO OOPHORECTOMY;  Surgeon: Emily Filbert, MD;  Location: [facility];  Service: Gynecology;  Laterality: Bilateral;   TONSILLECTOMY     VAGINAL HYSTERECTOMY N/A 04/23/2012   Procedure: HYSTERECTOMY VAGINAL;  Surgeon: Emily Filbert, MD;  Location: [facility];  Service: Gynecology;  Laterality: N/A;   VAGINAL PROLAPSE REPAIR N/A 04/23/2012   Procedure: VAGINAL VAULT SUSPENSION;  Surgeon: Reece Packer, MD;  Location: [facility];  Service: Urology;   Laterality: N/A;   HPI:  Pt is a 80 y.o. femalewho presents to the ED for evaluation of abd pain.  Chart review indicates history of HTN, HLD, iron deficiency anemia, schizoaffective Psychiatric disorder; SBO prior.  Patient presents to the ED from home for evaluation of 3 days of abdominal pain, emesis and constipation.  She was at home alone, ambulatory independently with a cane or walker. Reports 3 days of progressive generalized abdominal pain with nausea and occasional nonbloody nonbilious emesis.  Reports primarily left upper quadrant abdominal pain, 6-10 intensity, nonradiating aching.  Reports no dysuria or urinary changes.  Denies diarrhea, but reports constipation with no bowel movements for 3 to 4 days. Is not passing gas.  Surgery is following pt.   Imaging: Right paratracheal soft tissue opacity, not well characterized,  possibly artifactual and related to rotation. This would be better assessed with a standard two view chest radiograph or CT examination  when the patient is clinically able to do so. Pulmonary hypoinflation.  MRI: Patchy acute cortical and subcortical watershed type ischemic  infarcts involving the bilateral cerebral hemispheres, right worse  than left. No associated hemorrhage or mass effect.  2. Underlying moderately advanced chronic microvascular ischemic disease.    Assessment / Plan / Recommendation  Clinical Impression  Pt seen today for BSE. Pt has an NGT in place for suction/meds d/t recent SOB - Surgery following w/ pt . Pt also has a Right  paratracheal soft tissue opacity and new R sided infarcts.  Per BSE, she appears to present w/ oropharyngeal phase dysphagia in light of declined Cognitive presentation/status at this time; unsure of pt's Baseline Cognitive status. Noted Psychiatric dx in H&P; MRI results including old chronic changes and new R sided infarcts. This presentation can impact her overall awareness/timing of swallow and safety during po tasks which  increases risk for aspiration, choking. Pt's risk for aspiration is present but can be reduced when following general aspiration precautions and using a modified diet consistency w/ Nectar liquids as well as Supervision at meals. She required MOD verbal/tactile/visual cues for follow through during po tasks.       Pt consumed trials of ice chips, purees, and Nectar liquids w/ No overt clinical s/s of aspiration noted -- no decline in vocal quality; no cough, and no decline in respiratory status during/post trials. Oral phase was adequate for bolus management and oral clearing of the boluses given. Solids foods and thin liquids trials were Not attempted d/t Cognitive/mental status presentation at the session -- eyes closed throughtout session. Pt did not attempt to hold own Cup during drinking (which improves safety of swallowing). OM Exam appeared to present slight+ unilateral Labial weakness in Left corner of mouth; No lingual weakness obvious. Pt did not fully follow through w/ the OM exam so much was through observation w/ bolus trials. Some confusion of OM tasks and w/ oral care noted. Pt exhibited Left UE weakness during this eval - elevated the LUE on pillow.      D/t pt's Cognitive/mental status presentation at this time and her risk for aspiration, recommend initiation of the dysphagia level 1(puree) w/ Nectar liquids when able to have the NG Tube removed -- MD made aware. Recommend general aspiration precautions; reduce Distractions during meals and engage pt during po's at meal for self-feeding. Pills Crushed in Puree for safer swallowing. Support w/ feeding at meals as needed. MD/NSG updated.  ST services will follow while admitted. SLP Visit Diagnosis: Dysphagia, oropharyngeal phase (R13.12) (Cognitive decline)    Aspiration Risk  Mild aspiration risk;Risk for inadequate nutrition/hydration    Diet Recommendation   dysphagia level 1(puree) w/ Nectar liquids when able to have the NG Tube removed  -- MD made aware. Recommend general aspiration precautions; reduce Distractions during meals and engage pt during po's at meal for self-feeding. Feeding Support at meals w/ Upright positioning. General REFLUX precautions.  Medication Administration: Crushed with puree (for safer swallowing)    Other  Recommendations Recommended Consults: Consider ENT evaluation (Dietician f/u; Palliative f/u) Oral Care Recommendations: Oral care BID;Oral care before and after PO;Staff/trained caregiver to provide oral care Other Recommendations: Order thickener from pharmacy;Remove water pitcher;Prohibited food (jello, ice cream, thin soups);Have oral suction available    Recommendations for follow up therapy are one component of a multi-disciplinary discharge planning process, led by the attending physician.  Recommendations may be updated based on patient status, additional functional criteria and insurance authorization.  Follow up Recommendations Skilled Nursing facility (TBD)      Frequency and Duration min 3x week  2 weeks       Prognosis Prognosis for Safe Diet Advancement: Fair (at this time) Barriers to Reach Goals: Cognitive deficits;Language deficits;Time post onset;Severity of deficits;Behavior      Swallow Study   General Date of Onset: 12/02/20 HPI: Pt is a 80 y.o. femalewho presents to the ED for evaluation of abd pain.  Chart review indicates history of HTN, HLD, iron deficiency  anemia, schizoaffective Psychiatric disorder; SBO prior.  Patient presents to the ED from home for evaluation of 3 days of abdominal pain, emesis and constipation.  She was at home alone, ambulatory independently with a cane or walker. Reports 3 days of progressive generalized abdominal pain with nausea and occasional nonbloody nonbilious emesis.  Reports primarily left upper quadrant abdominal pain, 6-10 intensity, nonradiating aching.  Reports no dysuria or urinary changes.  Denies diarrhea, but reports  constipation with no bowel movements for 3 to 4 days. Is not passing gas.  Surgery is following pt.   Imaging: Right paratracheal soft tissue opacity, not well characterized,  possibly artifactual and related to rotation. This would be better assessed with a standard two view chest radiograph or CT examination  when the patient is clinically able to do so. Pulmonary hypoinflation.  MRI: Patchy acute cortical and subcortical watershed type ischemic  infarcts involving the bilateral cerebral hemispheres, right worse  than left. No associated hemorrhage or mass effect.  2. Underlying moderately advanced chronic microvascular ischemic disease. Type of Study: Bedside Swallow Evaluation Previous Swallow Assessment: none Diet Prior to this Study: NPO Temperature Spikes Noted: No (wbc 10.2) Respiratory Status: Room air (0-3L) History of Recent Intubation: No Behavior/Cognition: Alert;Cooperative;Pleasant mood;Confused;Distractible;Requires cueing (eyes closed during session) Oral Cavity Assessment: Dry Oral Care Completed by SLP: Yes Oral Cavity - Dentition: Missing dentition Vision:  (n/a) Self-Feeding Abilities: Total assist Patient Positioning: Upright in bed (needed full assist) Baseline Vocal Quality: Normal (slightly mumbled at times d/t confusion) Volitional Cough: Cognitively unable to elicit Volitional Swallow: Unable to elicit    Oral/Motor/Sensory Function Overall Oral Motor/Sensory Function: Mild impairment Facial ROM: Reduced left (slight) Facial Symmetry: Abnormal symmetry left (slight) Facial Strength: Within Functional Limits Lingual ROM: Within Functional Limits Lingual Symmetry: Within Functional Limits Lingual Strength: Within Functional Limits Mandible: Within Functional Limits   Ice Chips Ice chips: Within functional limits Presentation: Spoon (fed; 10 trials)   Thin Liquid Thin Liquid: Not tested Other Comments: eyes closed and did not attempt to feed/hold cup    Nectar  Thick Nectar Thick Liquid: Within functional limits Presentation: Cup;Self Fed;Spoon (~3+ ozs) Other Comments: cues given d/t closed eyes   Honey Thick Honey Thick Liquid: Not tested   Puree Puree: Within functional limits Presentation: Spoon (fed; ~2-3 ozs) Other Comments: cues given d/t closed eyes   Solid     Solid: Not tested       Orinda Kenner, MS, CCC-SLP Speech Language Pathologist Rehab Services [phone] [facility] 12/07/2020,12:26 PM

## 2020-12-07 NOTE — Anesthesia Preprocedure Evaluation (Addendum)
Anesthesia Evaluation  Patient identified by MRN, date of birth, ID band Patient confused  General Assessment Comment:  Acute stroke few days ago (ischemic). Orthopedic surgeon says this procedure cannot wait weeks for the recovery of stroke.  Reviewed: Allergy & Precautions, NPO status , Patient's Chart, lab work & pertinent test results  History of Anesthesia Complications Negative for: history of anesthetic complications  Airway Mallampati: II  TM Distance: <3 FB Neck ROM: Full    Dental  (+) Lower Dentures, Upper Dentures   Pulmonary neg pulmonary ROS, neg sleep apnea, neg COPD, Patient abstained from smoking.Not current smoker, former smoker,    Pulmonary exam normal breath sounds clear to auscultation       Cardiovascular Exercise Tolerance: Good METShypertension, (-) CAD and (-) Past MI (-) dysrhythmias + Valvular Problems/Murmurs AS and MR  Rhythm:Regular Rate:Normal - Systolic murmurs TTE: 1. Left ventricular ejection fraction, by estimation, is 60 to 65%. The  left ventricle has normal function. The left ventricle has no regional  wall motion abnormalities. There is mild left ventricular hypertrophy.  Left ventricular diastolic parameters  are consistent with Grade I diastolic dysfunction (impaired relaxation).  2. Right ventricular systolic function is normal. The right ventricular  size is normal. Tricuspid regurgitation signal is inadequate for assessing  PA pressure.  3. Mild to moderate mitral valve regurgitation.  4. The aortic valve is moderate calcified. Mild aortic valve stenosis by  aortic valve mean gradient measures 14.5 mmHg. Aortic valve Vmax measures  2.43 m/s. Moderate stenosis by Aortic valve area, by VTI measures 1.26  cm.    Neuro/Psych PSYCHIATRIC DISORDERS Anxiety Depression Schizophrenia Acute stroke in past few days, with encephalopathy. Residual left sided weakness, confusion CVA,  Residual Symptoms    GI/Hepatic neg GERD  ,(+)     (-) substance abuse  ,   Endo/Other  neg diabetes  Renal/GU negative Renal ROS     Musculoskeletal  (+) Arthritis ,   Abdominal   Peds  Hematology  (+) anemia ,   Anesthesia Other Findings Past Medical History: No date: Anemia No date: Colon polyp 9/09: History of small bowel obstruction     Comment:  likely due to adhesions- pt refused most dx and tx in               hospital No date: History of vaginal bleeding     Comment:  post menopausal- gyn eval, ? polyp No date: Hyperlipidemia No date: Osteoarthritis     Comment:  knee No date: Schizoaffective disorder     Comment:  paranoid personality- refuses treatment No date: Vitamin D deficiency  Reproductive/Obstetrics                          Anesthesia Physical  Anesthesia Plan  ASA: 4  Anesthesia Plan: MAC   Post-op Pain Management:    Induction: Intravenous  PONV Risk Score and Plan: 3 and Ondansetron and TIVA  Airway Management Planned: Natural Airway  Additional Equipment: None  Intra-op Plan:   Post-operative Plan:   Informed Consent: I have reviewed the patients History and Physical, chart, labs and discussed the procedure including the risks, benefits and alternatives for the proposed anesthesia with the patient or authorized representative who has indicated his/her understanding and acceptance.   Patient has DNR.  Discussed DNR with power of attorney and Suspend DNR.   Dental advisory given  Plan Discussed with: CRNA and Surgeon  Anesthesia Plan Comments: (Discussed risks  of anesthesia with patient's daughters at bedside, including possibility of difficulty with spontaneous ventilation under anesthesia necessitating airway intervention, PONV, and rare risks such as cardiac or respiratory or neurological events, and allergic reactions. They were counseled on being higher risk for anesthesia due to comorbidities: acute  stroke. told about increased risk of cardiac and respiratory events, including death. Spoke about DNR; they wish to rescind it for perioperative period.  Will plan on IV sedation.)      Anesthesia Quick Evaluation

## 2020-12-07 NOTE — Anesthesia Postprocedure Evaluation (Signed)
Anesthesia Post Note  Patient: [name]  Procedure(s) Performed: CLOSED MANIPULATION KNEE (Right: Knee)  Patient location during evaluation: PACU Anesthesia Type: MAC Level of consciousness: awake and alert Pain management: pain level controlled Vital Signs Assessment: post-procedure vital signs reviewed and stable Respiratory status: spontaneous breathing, nonlabored ventilation, respiratory function stable and patient connected to nasal cannula oxygen Cardiovascular status: blood pressure returned to baseline and stable Postop Assessment: no apparent nausea or vomiting Anesthetic complications: no   No notable events documented.   Last Vitals:  Vitals:   12/07/20 1615 12/07/20 1630  BP: (!) 164/70 (!) 162/57  Pulse: 66 77  Resp: 19 19  Temp:  36.7 C  SpO2: 98% 96%    Last Pain:  Vitals:   12/07/20 1630  TempSrc:   PainSc: 0-No pain                 Molli Barrows

## 2020-12-07 NOTE — Transfer of Care (Signed)
Immediate Anesthesia Transfer of Care Note  Patient: [name]  Procedure(s) Performed: CLOSED MANIPULATION KNEE (Right: Knee)  Patient Location: PACU  Anesthesia Type:General  Level of Consciousness: drowsy and responds to stimulation  Airway & Oxygen Therapy: Patient Spontanous Breathing and Patient connected to face mask oxygen  Post-op Assessment: Report given to RN  Post vital signs: Reviewed and stable  Last Vitals:  Vitals Value Taken Time  BP 163/55 12/07/20 1545  Temp    Pulse 65 12/07/20 1549  Resp 25 12/07/20 1549  SpO2 99 % 12/07/20 1549  Vitals shown include unvalidated device data.  Last Pain:  Vitals:   12/07/20 1501  TempSrc: Temporal  PainSc:          Complications: No notable events documented.

## 2020-12-07 NOTE — Plan of Care (Signed)
PMT note:  In to see patient earlier today and patient was working with another discipline. Re-attempted at this time, and patient is currently off the unit. Will re-attempt tomorrow.

## 2020-12-08 ENCOUNTER — Ambulatory Visit: Payer: Medicare PPO | Admitting: Nurse Practitioner

## 2020-12-08 ENCOUNTER — Encounter: Payer: Self-pay | Admitting: Orthopedic Surgery

## 2020-12-08 DIAGNOSIS — Z7189 Other specified counseling: Secondary | ICD-10-CM | POA: Diagnosis not present

## 2020-12-08 DIAGNOSIS — T148XXA Other injury of unspecified body region, initial encounter: Secondary | ICD-10-CM | POA: Diagnosis not present

## 2020-12-08 DIAGNOSIS — I6389 Other cerebral infarction: Secondary | ICD-10-CM | POA: Diagnosis not present

## 2020-12-08 DIAGNOSIS — F418 Other specified anxiety disorders: Secondary | ICD-10-CM | POA: Diagnosis not present

## 2020-12-08 DIAGNOSIS — K4030 Unilateral inguinal hernia, with obstruction, without gangrene, not specified as recurrent: Secondary | ICD-10-CM | POA: Diagnosis not present

## 2020-12-08 LAB — BASIC METABOLIC PANEL
Anion gap: 10 (ref 5–15)
BUN: 18 mg/dL (ref 8–23)
CO2: 20 mmol/L — ABNORMAL LOW (ref 22–32)
Calcium: 8.8 mg/dL — ABNORMAL LOW (ref 8.9–10.3)
Chloride: 106 mmol/L (ref 98–111)
Creatinine, Ser: 0.55 mg/dL (ref 0.44–1.00)
GFR, Estimated: >60 mL/min (ref >60)
Glucose, Bld: 119 mg/dL — ABNORMAL HIGH (ref 70–99)
Potassium: 3.6 mmol/L (ref 3.5–5.1)
Sodium: 136 mmol/L (ref 135–145)

## 2020-12-08 LAB — CBC
HCT: 38.1 % (ref 36.0–46.0)
Hemoglobin: 12.5 g/dL (ref 12.0–15.0)
MCH: 28.9 pg (ref 26.0–34.0)
MCHC: 32.8 g/dL (ref 30.0–36.0)
MCV: 88 fL (ref 80.0–100.0)
Platelets: 248 10*3/uL (ref 150–400)
RBC: 4.33 MIL/uL (ref 3.87–5.11)
RDW: 14.8 % (ref 11.5–15.5)
WBC: 9.7 10*3/uL (ref 4.0–10.5)
nRBC: 0 % (ref 0.0–0.2)

## 2020-12-08 MED ORDER — CARVEDILOL 6.25 MG PO TABS
6.2500 mg | ORAL_TABLET | Freq: Two times a day (BID) | ORAL | Status: DC
  Administered 2020-12-08 – 2020-12-13 (×10): 6.25 mg via ORAL
  Filled 2020-12-08 (×8): qty 1
  Filled 2020-12-08: qty 2
  Filled 2020-12-08: qty 1

## 2020-12-08 MED ORDER — ROSUVASTATIN CALCIUM 10 MG PO TABS
10.0000 mg | ORAL_TABLET | Freq: Every day | ORAL | Status: DC
  Administered 2020-12-09 – 2020-12-13 (×5): 10 mg via ORAL
  Filled 2020-12-08 (×5): qty 1

## 2020-12-08 MED ORDER — NEPRO/CARBSTEADY PO LIQD
237.0000 mL | Freq: Two times a day (BID) | ORAL | Status: DC
  Administered 2020-12-08 – 2020-12-09 (×2): 237 mL via ORAL

## 2020-12-08 MED ORDER — AMLODIPINE BESYLATE 10 MG PO TABS
10.0000 mg | ORAL_TABLET | Freq: Every day | ORAL | Status: DC
  Administered 2020-12-08 – 2020-12-12 (×5): 10 mg via ORAL
  Filled 2020-12-08 (×5): qty 1

## 2020-12-08 NOTE — Consult Note (Signed)
Consultation Note Date: 12/08/2020   Patient Name: [name]  DOB: October 16, 1940  MRN: [id number]  Age / Sex: 80 y.o., female  PCP: Jon Billings, NP Referring Physician: Debbe Odea, MD  Reason for Consultation: Establishing goals of care  HPI/Patient Profile: [name] , a 80 y.o. female  was evaluated in triage.  Pt complains of diffuse upper abdominal pain, nausea, malaise, possible dehydration.  Patient presents with no solid intake in the last 3 days.  Patient has been out to have some some water but is unable to drink water like normal.  Patient states that she has been constipated but had a large bowel movement and is no longer constipated.  Clinical Assessment and Goals of Care: Patient is in bed working with staff. Daughter is present. She states she lives in [place], and has just arrived and been updated by attending team.   She states at baseline, her mother lives alone and is independent. She discusses her mother's knee pain being a limiting factor as it takes time to walk to her car or into businesses. She tells me one of the knees have been replaced, and the other she was waiting to have replaced. When she had her knee replacement she went to rehab and worked very hard to be able to return home. She states independence is very important to her. We discussed multiple scenarios. She discusses concern for how her mother would handle learning she would need to live in a facility and would not be able to return home.   She discusses that her mother does better being alone given her untreated schizophrenia, as engaging with people causes worsened delusions. She states they have started her on treatment multiple times and she stops the medications herself.   She confirms DNR/DNI status.   SUMMARY OF RECOMMENDATIONS   DNR/DNI. PMT will follow along.  Prognosis:  <  12 months      Primary Diagnoses: Present on Admission:  Strangulated inguinal hernia  Anemia  Hypertension  Stroke ([place])  HLD (hyperlipidemia)  Depression with anxiety  Coma ([place])   I have reviewed the medical record, interviewed the patient and family, and examined the patient. The following aspects are pertinent.  Past Medical History:  Diagnosis Date   Anemia    Colon polyp    History of small bowel obstruction 9/09   likely due to adhesions- pt refused most dx and tx in hospital   History of vaginal bleeding    post menopausal- gyn eval, ? polyp   Hyperlipidemia    Osteoarthritis    knee   Schizoaffective disorder    paranoid personality- refuses treatment   Vitamin D deficiency    Social History   Socioeconomic History   Marital status: Widowed    Spouse name: Not on file   Number of children: 3   Years of education: Not on file   Highest education level: Not on file  Occupational History   Occupation: retired Pharmacist, hospital  Tobacco Use   Smoking  status: Former    Packs/day: 0.50    Years: 20.00    Pack years: 10.00    Types: Cigarettes    Quit date: 03/13/1982    Years since quitting: 38.7   Smokeless tobacco: Never  Vaping Use   Vaping Use: Never used  Substance and Sexual Activity   Alcohol use: No   Drug use: No   Sexual activity: Yes    Birth control/protection: Post-menopausal  Other Topics Concern   Not on file  Social History Narrative   Lives alone   Social Determinants of Health   Financial Resource Strain: Not on file  Food Insecurity: Not on file  Transportation Needs: Not on file  Physical Activity: Not on file  Stress: Not on file  Social Connections: Not on file   Family History  Problem Relation Age of Onset   Heart disease Mother    Diabetes Mother    Skin cancer Mother    Diabetes Father    Heart disease Father    Hypertension Father    Diabetes Brother    Arthritis Brother    Hypertension Brother    Hyperlipidemia  Brother    Arthritis Daughter    Cancer Son    Obesity Daughter    Scheduled Meds:  amLODipine  10 mg Oral QHS   carvedilol  6.25 mg Oral BID WC   Chlorhexidine Gluconate Cloth  6 each Topical Daily   docusate sodium  100 mg Oral BID   enoxaparin (LOVENOX) injection  40 mg Subcutaneous Q24H   feeding supplement (NEPRO CARB STEADY)  237 mL Oral BID BM   mouth rinse  15 mL Mouth Rinse BID   [START ON 12/09/2020] rosuvastatin  10 mg Oral Daily   sertraline  25 mg Oral Daily   Continuous Infusions:  sodium chloride 250 mL (12/08/20 0533)   piperacillin-tazobactam (ZOSYN)  IV 3.375 g (12/08/20 0535)   PRN Meds:.sodium chloride, acetaminophen, acetaminophen, dextrose, hydrALAZINE, HYDROcodone-acetaminophen, metoCLOPramide **OR** metoCLOPramide (REGLAN) injection, ondansetron **OR** ondansetron (ZOFRAN) IV, traMADol Medications Prior to Admission:  Prior to Admission medications   Medication Sig Start Date End Date Taking? Authorizing Provider  carvedilol (COREG) 6.25 MG tablet Take 1 tablet (6.25 mg total) by mouth 2 (two) times daily with a meal. 11/25/20  Yes Jon Billings, NP  sertraline (ZOLOFT) 25 MG tablet Take 1 tablet (25 mg total) by mouth daily. 11/25/20  Yes Jon Billings, NP  triamcinolone cream (KENALOG) 0.1 % Apply 1 application topically 2 (two) times daily. 10/12/20  Yes McElwee, Lauren A, NP  amLODipine (NORVASC) 5 MG tablet Take 1 tablet (5 mg total) by mouth at bedtime. 11/25/20   Jon Billings, NP  gabapentin (NEURONTIN) 100 MG capsule Take 1 capsule (100 mg total) by mouth at bedtime. Take 1 capsule at bedtime. If no relief, can go up to 2 tablets at bedtime after 3 days. 09/09/20   McElwee, Lauren A, NP  lisinopril (ZESTRIL) 40 MG tablet Take 1 tablet (40 mg total) by mouth daily. 11/25/20   Jon Billings, NP  rosuvastatin (CRESTOR) 10 MG tablet Take 1 tablet (10 mg total) by mouth daily. 11/25/20   Jon Billings, NP  tiZANidine (ZANAFLEX) 4 MG tablet Take 4  mg by mouth at bedtime. 09/28/20   [provider]  traMADol (ULTRAM) 50 MG tablet Take 50 mg by mouth every 8 (eight) hours as needed. Patient not taking: Reported on 12/03/2020 09/23/20   [provider]   Allergies  Allergen Reactions  Penicillins Rash    TOLERATED CEFAZOLIN   Review of Systems  All other systems reviewed and are negative.  Physical Exam Pulmonary:     Effort: Pulmonary effort is normal.  Abdominal:     General: Abdomen is flat. A surgical scar is present.  Neurological:     Mental Status: She is alert.    Vital Signs: BP (!) 174/55 (BP Location: Left Arm)   Pulse 65   Temp 97.9 F (36.6 C)   Resp 19   Ht 5\' 3"  (1.6 m)   Wt 86 kg Comment: bed weight  SpO2 96%   BMI 33.59 kg/m  Pain Scale: 0-10   Pain Score: 0-No pain   SpO2: SpO2: 96 % O2 Device:SpO2: 96 % O2 Flow Rate: .O2 Flow Rate (L/min): 3 L/min  IO: Intake/output summary:  Intake/Output Summary (Last 24 hours) at 12/08/2020 1226 Last data filed at 12/08/2020 1045 Gross per 24 hour  Intake 1012.29 ml  Output 650 ml  Net 362.29 ml    LBM: Last BM Date: 12/01/20 Baseline Weight: Weight: 82.5 kg Most recent weight: Weight: 86 kg (bed weight)        Time In: 10:00 Time Out: 10:40 Time Total: 40 min Greater than 50%  of this time was spent counseling and coordinating care related to the above assessment and plan.  Signed by: Asencion Gowda, NP   Please contact Palliative Medicine Team phone at [phone] for questions and concerns.  For individual provider: See Shea Evans

## 2020-12-08 NOTE — Progress Notes (Addendum)
PROGRESS NOTE    [name]   TKZ:[id number]  DOB: 12/12/1940  DOA: 12/02/2020 PCP: Jon Billings, NP   Brief Narrative:  [name] is an 80 y.o. female with a past medical history of hypertension, hyperlipidemia, prediabetes, depression with anxiety, schizoaffective disorder, anemia, skin cancer, was admitted by the surgical team for strangulated inguinal hernia. The patient underwent open right femoral hernia repair with mesh and small bowel resection on 9/22. 9/24, rapid response was called the patient was found unresponsive.  Subsequently, code stroke was called.  On exam the patient was noted to have sonorous respirations while her eyes were wide open and did not respond to any external stimuli.  She had extensor posturing.  CT of the head showed a questionable acute cortical infarct within the right parietal lobe. MRI of the brain revealed bilateral MCA/ACA territory watershed infarcts.  It was noted that the patient's blood pressures had been low earlier in the morning.  During hospitalization, the patient was discovered to have a dislocation of the right knee.  An orthopedics consult was requested on 9/26.  And underwent reduction of the knee on 9/27 under IV anesthesia.  Subjective: Sleepy this morning but easily awakens.  She is not sure where she is and thinks that the month is March but is able to recall that the years 2022.  She complains of pain in her right knee.     Assessment & Plan:   Principal Problem:   Strangulated inguinal hernia -She underwent right femoral hernia repair with mesh and small bowel resection on 9/22 - Continue management per general surgical team-she continues to receive Zosyn - NG tube removed 9/27-when she is able to tolerate D1 nectar thick diet, IV fluids are to be discontinued  Active Problems: Bilateral cerebral hemispheric watershed infarcts related to postop hypotension -Patient was obtunded immediately after  above-mentioned infarcts - She is beginning to awaken now - Goal blood pressures initially were between 130-180 -BPs are now rising and this morning's blood pressure was 196/74 I will go ahead and resume carvedilol today and also order as needed hydralazine if systolic is greater than 573  Right knee dislocation status post reduction on 9/27 - Per orthopedic surgery, the knee must remain in an immobilizer at all times and patient will need to follow-up in 2 weeks with Dr. Harlow Mares - Continue pain control and physical therapy efforts    Hypertension -See above in regards to blood pressure medications    HLD (hyperlipidemia) -Continue Crestor    Depression with anxiety -Continue sertraline    Time spent in minutes: 35 DVT prophylaxis: SCDs Start: 12/07/20 1702 enoxaparin (LOVENOX) injection 40 mg Start: 12/03/20 0800 Code Status: DNR Family Communication: Plan discussed with daughter, Romelle Starcher Level of Care: Level of care: Med-Surg Disposition Plan:  Status is: Inpatient  Remains inpatient appropriate because:Inpatient level of care appropriate due to severity of illness  Dispo: The patient is from: Home              Anticipated d/c is to: SNF              Patient currently is not medically stable to d/c.   Difficult to place patient No      Consultants:  General surgery Neurology Orthopedic surgery  Procedures:  open right femoral hernia repair with mesh and small bowel resection  Right knee reduction Antimicrobials:  Anti-infectives (From admission, onward)    Start     Dose/Rate Route Frequency Ordered Stop  12/02/20 2200  piperacillin-tazobactam (ZOSYN) IVPB 3.375 g        3.375 g 12.5 mL/hr over 240 Minutes Intravenous Every 8 hours 12/02/20 2138     12/02/20 1827  ceFAZolin (ANCEF) IVPB 2g/100 mL premix  Status:  Discontinued       Note to Pharmacy: On call to OR   2 g 200 mL/hr over 30 Minutes Intravenous 60 min pre-op 12/02/20 1819 12/02/20 2212         Objective: Vitals:   12/08/20 0002 12/08/20 0404 12/08/20 0734 12/08/20 0812  BP: (!) 147/43 (!) 190/68 (!) 190/64 (!) 196/74  Pulse: 79 81 82 73  Resp: 18 16  20   Temp:  97.7 F (36.5 C)  98.2 F (36.8 C)  TempSrc:  Oral    SpO2: 96% 99% 97% 96%  Weight:      Height:        Intake/Output Summary (Last 24 hours) at 12/08/2020 0954 Last data filed at 12/08/2020 0414 Gross per 24 hour  Intake 892.29 ml  Output 1100 ml  Net -207.71 ml   Filed Weights   12/04/20 0756 12/04/20 2111  Weight: 82.5 kg 76.7 kg    Examination: General exam: Elderly female laying in bed in no acute distress-she is sleepy but appears comfortable  HEENT: PERRLA, oral mucosa moist, no sclera icterus or thrush Respiratory system: Clear to auscultation. Respiratory effort normal. Cardiovascular system: S1 & S2 heard, RRR.   Gastrointestinal system: Abdomen soft, non-tender, nondistended. Normal bowel sounds. Central nervous system: Alert and oriented to person.  Is not fully following commands at the moment Extremities: No cyanosis, clubbing or edema-right leg is in an immobilizer Skin: No rashes or ulcers Psychiatry:  Mood & affect appropriate.     Data Reviewed: I have personally reviewed following labs and imaging studies  CBC: Recent Labs  Lab 12/04/20 0535 12/05/20 0636 12/06/20 0430 12/07/20 0346 12/08/20 0419  WBC 11.6* 9.8 9.8 10.2 9.7  HGB 10.4* 11.6* 11.2* 11.0* 12.5  HCT 29.8* 35.3* 33.6* 32.7* 38.1  MCV 87.4 88.3 90.8 88.9 88.0  PLT 184 201 219 262 470   Basic Metabolic Panel: Recent Labs  Lab 12/04/20 0535 12/05/20 0636 12/06/20 0430 12/07/20 0346 12/08/20 0419  NA 134* 138 141 141 136  K 3.4* 3.4* 3.4* 3.4* 3.6  CL 96* 100 108 108 106  CO2 25 24 24  20* 20*  GLUCOSE 128* 100* 105* 93 119*  BUN 48* 55* 35* 27* 18  CREATININE 1.31* 0.99 0.55 0.58 0.55  CALCIUM 8.6* 9.1 8.8* 8.9 8.8*  MG  --  2.2  --  1.8  --   PHOS  --  3.5  --   --   --    GFR: Estimated  Creatinine Clearance: 54.1 mL/min (by C-G formula based on SCr of 0.55 mg/dL). Liver Function Tests: Recent Labs  Lab 12/02/20 1232  AST 33  ALT 16  ALKPHOS 57  BILITOT 2.1*  PROT 8.1  ALBUMIN 4.8   Recent Labs  Lab 12/02/20 1232  LIPASE 25   No results for input(s): AMMONIA in the last 168 hours. Coagulation Profile: No results for input(s): INR, PROTIME in the last 168 hours. Cardiac Enzymes: No results for input(s): CKTOTAL, CKMB, CKMBINDEX, TROPONINI in the last 168 hours. BNP (last 3 results) No results for input(s): PROBNP in the last 8760 hours. HbA1C: No results for input(s): HGBA1C in the last 72 hours. CBG: Recent Labs  Lab 12/04/20 1204 12/04/20 2112 12/04/20 2337 12/05/20  0804 12/07/20 1544  GLUCAP 99 87 86 103* 106*   Lipid Profile: No results for input(s): CHOL, HDL, LDLCALC, TRIG, CHOLHDL, LDLDIRECT in the last 72 hours. Thyroid Function Tests: No results for input(s): TSH, T4TOTAL, FREET4, T3FREE, THYROIDAB in the last 72 hours. Anemia Panel: No results for input(s): VITAMINB12, FOLATE, FERRITIN, TIBC, IRON, RETICCTPCT in the last 72 hours. Urine analysis:    Component Value Date/Time   COLORURINE AMBER BIOCHEMICALS MAY BE AFFECTED BY COLOR (A) 06/25/2008 1242   APPEARANCEUR Cloudy (A) 08/31/2020 1039   LABSPEC 1.023 06/25/2008 1242   PHURINE 6.0 06/25/2008 1242   GLUCOSEU Negative 08/31/2020 1039   HGBUR NEGATIVE 06/25/2008 1242   BILIRUBINUR Negative 08/31/2020 1039   KETONESUR >80 (A) 06/25/2008 1242   PROTEINUR 1+ (A) 08/31/2020 1039   PROTEINUR NEGATIVE 06/25/2008 1242   UROBILINOGEN 0.2 06/25/2008 1242   NITRITE Positive (A) 08/31/2020 1039   NITRITE NEGATIVE 06/25/2008 1242   LEUKOCYTESUR 2+ (A) 08/31/2020 1039   Sepsis Labs: @LABRCNTIP (procalcitonin:4,lacticidven:4) ) Recent Results (from the past 240 hour(s))  Resp Panel by RT-PCR (Flu A&B, Covid) Nasopharyngeal Swab     Status: None   Collection Time: 12/02/20  4:39 PM    Specimen: Nasopharyngeal Swab; Nasopharyngeal(NP) swabs in vial transport medium  Result Value Ref Range Status   SARS Coronavirus 2 by RT PCR NEGATIVE NEGATIVE Final    Comment: (NOTE) SARS-CoV-2 target nucleic acids are NOT DETECTED.  The SARS-CoV-2 RNA is generally detectable in upper respiratory specimens during the acute phase of infection. The lowest concentration of SARS-CoV-2 viral copies this assay can detect is 138 copies/mL. A negative result does not preclude SARS-Cov-2 infection and should not be used as the sole basis for treatment or other patient management decisions. A negative result may occur with  improper specimen collection/handling, submission of specimen other than nasopharyngeal swab, presence of viral mutation(s) within the areas targeted by this assay, and inadequate number of viral copies(<138 copies/mL). A negative result must be combined with clinical observations, patient history, and epidemiological information. The expected result is Negative.  Fact Sheet for Patients:  EntrepreneurPulse.com.au  Fact Sheet for Healthcare Providers:  IncredibleEmployment.be  This test is no t yet approved or cleared by the [place] FDA and  has been authorized for detection and/or diagnosis of SARS-CoV-2 by FDA under an Emergency Use Authorization (EUA). This EUA will remain  in effect (meaning this test can be used) for the duration of the COVID-19 declaration under Section 564(b)(1) of the Act, 21 U.S.C.section 360bbb-3(b)(1), unless the authorization is terminated  or revoked sooner.       Influenza A by PCR NEGATIVE NEGATIVE Final   Influenza B by PCR NEGATIVE NEGATIVE Final    Comment: (NOTE) The Xpert Xpress SARS-CoV-2/FLU/RSV plus assay is intended as an aid in the diagnosis of influenza from Nasopharyngeal swab specimens and should not be used as a sole basis for treatment. Nasal washings and aspirates are  unacceptable for Xpert Xpress SARS-CoV-2/FLU/RSV testing.  Fact Sheet for Patients: EntrepreneurPulse.com.au  Fact Sheet for Healthcare Providers: IncredibleEmployment.be  This test is not yet approved or cleared by the [place] FDA and has been authorized for detection and/or diagnosis of SARS-CoV-2 by FDA under an Emergency Use Authorization (EUA). This EUA will remain in effect (meaning this test can be used) for the duration of the COVID-19 declaration under Section 564(b)(1) of the Act, 21 U.S.C. section 360bbb-3(b)(1), unless the authorization is terminated or revoked.  Performed at [facility], [place]  [address] [place], [place] [address]   MRSA Next Gen by PCR, Nasal     Status: None   Collection Time: 12/04/20  9:05 PM   Specimen: Nasal Mucosa; Nasal Swab  Result Value Ref Range Status   MRSA by PCR Next Gen NOT DETECTED NOT DETECTED Final    Comment: (NOTE) The GeneXpert MRSA Assay (FDA approved for NASAL specimens only), is one component of a comprehensive MRSA colonization surveillance program. It is not intended to diagnose MRSA infection nor to guide or monitor treatment for MRSA infections. Test performance is not FDA approved in patients less than 42 years old. Performed at [facility], [address], [place], [place] [address]          Radiology Studies: DG Knee 1-2 Views Right  Result Date: 12/06/2020 CLINICAL DATA:  RIGHT knee dislocation EXAM: RIGHT KNEE - 1-2 VIEW COMPARISON:  None. FINDINGS: Lateral projection demonstrates anterior dislocation of the femoral component in relation to the tibial component. No evidence of fracture. IMPRESSION: Anterior dislocation the knee joint. Electronically Signed   By: Suzy Bouchard M.D.   On: 12/06/2020 14:46   DG Abd 1 View  Result Date: 12/07/2020 CLINICAL DATA:  Ileus EXAM: ABDOMEN - 1 VIEW COMPARISON:  12/02/2020 FINDINGS: Cutaneous staples over  the low pelvic region. Overall nonobstructed gas pattern with resolution of previously noted dilated small bowel. Aortic atherosclerosis. No suspicious calcifications. IMPRESSION: Nonobstructed bowel-gas pattern Electronically Signed   By: Donavan Foil M.D.   On: 12/07/2020 19:50   DG Knee Right Port  Result Date: 12/07/2020 CLINICAL DATA:  Surgical manipulation of right knee joint EXAM: PORTABLE RIGHT KNEE - 1-2 VIEW COMPARISON:  12/06/2020 FINDINGS: Frontal and cross-table lateral views of the right knee demonstrate reduction of prior dislocation. The 3 component right knee arthroplasty demonstrates anatomic alignment. No evidence of fracture. Small joint effusion. IMPRESSION: 1. Reduction of prior right knee arthroplasty dislocation, now with anatomic alignment. 2. Small joint effusion. Electronically Signed   By: Randa Ngo M.D.   On: 12/07/2020 17:52      Scheduled Meds:  amLODipine  5 mg Oral QHS   atorvastatin  40 mg Per Tube Daily   carvedilol  6.25 mg Per Tube BID WC   Chlorhexidine Gluconate Cloth  6 each Topical Daily   docusate sodium  100 mg Oral BID   enoxaparin (LOVENOX) injection  40 mg Subcutaneous Q24H   mouth rinse  15 mL Mouth Rinse BID   pantoprazole (PROTONIX) IV  40 mg Intravenous QHS   sertraline  25 mg Oral Daily   triamcinolone cream  1 application Topical BID   Continuous Infusions:  sodium chloride 250 mL (12/08/20 0533)   sodium chloride 75 mL/hr at 12/08/20 0537   piperacillin-tazobactam (ZOSYN)  IV 3.375 g (12/08/20 0535)     LOS: 6 days      Debbe Odea, MD [facility] Pager: [email or web address] 12/08/2020, 9:54 AM

## 2020-12-08 NOTE — Progress Notes (Addendum)
Speech Language Pathology Treatment: Dysphagia  Patient Details Name: [name] MRN: [id number] DOB: 1940/09/02 Today's Date: 12/08/2020 Time: 3295-1884 SLP Time Calculation (min) (ACUTE ONLY): 27 min  Assessment / Plan / Recommendation Clinical Impression  Pt visited with Po's during dysphagia treatment. Pt requesting thin liquids. Pt ws sleeping upon ST entering. Two daughters in the room, very supportive. Pt awakened easily and tolerated thin liquids by tsp, cup and straw. Pt needed cues to take single sips from the straw. Vocal quality remained clear. Given one bite of a graham cracker, Pt needed extended time to chew and propel for a swallow. Will upgrade to dys 1 with thin liquids. Pt needs to stay on pureed foods for now. Discussed with daughters need for single sips at a time, straw is ok. Meds to be given crushed in applesauce. Also discussed with Nsg. ST to follow up 1-2 days. Trials of solids again as overall strength improves. Pt was able to use clear speech during dysphagia session today. ST to continue to follow.    HPI HPI: Pt is a 80 y.o. femalewho presents to the ED for evaluation of abd pain.  Chart review indicates history of HTN, HLD, iron deficiency anemia, schizoaffective Psychiatric disorder; SBO prior.  Patient presents to the ED from home for evaluation of 3 days of abdominal pain, emesis and constipation.  She was at home alone, ambulatory independently with a cane or walker. Reports 3 days of progressive generalized abdominal pain with nausea and occasional nonbloody nonbilious emesis.  Reports primarily left upper quadrant abdominal pain, 6-10 intensity, nonradiating aching.  Reports no dysuria or urinary changes.  Denies diarrhea, but reports constipation with no bowel movements for 3 to 4 days. Is not passing gas.  Surgery is following pt.   Imaging: Right paratracheal soft tissue opacity, not well characterized,  possibly artifactual and related to rotation. This  would be better assessed with a standard two view chest radiograph or CT examination  when the patient is clinically able to do so. Pulmonary hypoinflation.  MRI: Patchy acute cortical and subcortical watershed type ischemic  infarcts involving the bilateral cerebral hemispheres, right worse  than left. No associated hemorrhage or mass effect.  2. Underlying moderately advanced chronic microvascular ischemic disease.      SLP Plan  Continue with current plan of care      Recommendations for follow up therapy are one component of a multi-disciplinary discharge planning process, led by the attending physician.  Recommendations may be updated based on patient status, additional functional criteria and insurance authorization.    Recommendations  Diet recommendations: Dysphagia 1 (puree);Thin liquid Liquids provided via: Straw (single sips only) Medication Administration: Crushed with puree Supervision: Staff to assist with self feeding Compensations: Minimize environmental distractions;Small sips/bites;Slow rate Postural Changes and/or Swallow Maneuvers: Seated upright 90 degrees;Upright 30-60 min after meal                Oral Care Recommendations: Oral care BID;Oral care before and after PO;Staff/trained caregiver to provide oral care Follow up Recommendations: Skilled Nursing facility SLP Visit Diagnosis: Dysphagia, oropharyngeal phase (R13.12) Plan: Continue with current plan of care       GO                Lucila Maine  12/08/2020, 1:59 PM

## 2020-12-08 NOTE — Progress Notes (Signed)
Subjective:  Patient reports pain as mild.  Patirnt had closed manipulation yesterday for posterior knee dislocation after fall.  Objective:   VITALS:   Vitals:   12/07/20 1630 12/07/20 2100 12/08/20 0002 12/08/20 0404  BP: (!) 162/57 (!) 180/55 (!) 147/43 (!) 190/68  Pulse: 77 73 79 81  Resp: 19 18 18 16   Temp: 98 F (36.7 C) 98.2 F (36.8 C)  97.7 F (36.5 C)  TempSrc:  Oral  Oral  SpO2: 96% 96% 96% 99%  Weight:      Height:        PHYSICAL EXAM:  Neurologically intact ABD soft Neurovascular intact Sensation intact distally Intact pulses distally Dorsiflexion/Plantar flexion intact Incision: dressing C/D/I and knee immobilizer in place No cellulitis present Compartment soft  LABS  Results for orders placed or performed during the hospital encounter of 12/02/20 (from the past 24 hour(s))  Glucose, capillary     Status: Abnormal   Collection Time: 12/07/20  3:44 PM  Result Value Ref Range   Glucose-Capillary 106 (H) 70 - 99 mg/dL  Basic metabolic panel     Status: Abnormal   Collection Time: 12/08/20  4:19 AM  Result Value Ref Range   Sodium 136 135 - 145 mmol/L   Potassium 3.6 3.5 - 5.1 mmol/L   Chloride 106 98 - 111 mmol/L   CO2 20 (L) 22 - 32 mmol/L   Glucose, Bld 119 (H) 70 - 99 mg/dL   BUN 18 8 - 23 mg/dL   Creatinine, Ser 0.55 0.44 - 1.00 mg/dL   Calcium 8.8 (L) 8.9 - 10.3 mg/dL   GFR, Estimated >60 >60 mL/min   Anion gap 10 5 - 15  CBC     Status: None   Collection Time: 12/08/20  4:19 AM  Result Value Ref Range   WBC 9.7 4.0 - 10.5 K/uL   RBC 4.33 3.87 - 5.11 MIL/uL   Hemoglobin 12.5 12.0 - 15.0 g/dL   HCT 38.1 36.0 - 46.0 %   MCV 88.0 80.0 - 100.0 fL   MCH 28.9 26.0 - 34.0 pg   MCHC 32.8 30.0 - 36.0 g/dL   RDW 14.8 11.5 - 15.5 %   Platelets 248 150 - 400 K/uL   nRBC 0.0 0.0 - 0.2 %    DG Knee 1-2 Views Right  Result Date: 12/06/2020 CLINICAL DATA:  RIGHT knee dislocation EXAM: RIGHT KNEE - 1-2 VIEW COMPARISON:  None. FINDINGS: Lateral  projection demonstrates anterior dislocation of the femoral component in relation to the tibial component. No evidence of fracture. IMPRESSION: Anterior dislocation the knee joint. Electronically Signed   By: Suzy Bouchard M.D.   On: 12/06/2020 14:46   DG Abd 1 View  Result Date: 12/07/2020 CLINICAL DATA:  Ileus EXAM: ABDOMEN - 1 VIEW COMPARISON:  12/02/2020 FINDINGS: Cutaneous staples over the low pelvic region. Overall nonobstructed gas pattern with resolution of previously noted dilated small bowel. Aortic atherosclerosis. No suspicious calcifications. IMPRESSION: Nonobstructed bowel-gas pattern Electronically Signed   By: Donavan Foil M.D.   On: 12/07/2020 19:50   DG Knee Right Port  Result Date: 12/07/2020 CLINICAL DATA:  Surgical manipulation of right knee joint EXAM: PORTABLE RIGHT KNEE - 1-2 VIEW COMPARISON:  12/06/2020 FINDINGS: Frontal and cross-table lateral views of the right knee demonstrate reduction of prior dislocation. The 3 component right knee arthroplasty demonstrates anatomic alignment. No evidence of fracture. Small joint effusion. IMPRESSION: 1. Reduction of prior right knee arthroplasty dislocation, now with anatomic alignment. 2. Small joint  effusion. Electronically Signed   By: Randa Ngo M.D.   On: 12/07/2020 17:52    Assessment/Plan: 1 Day Post-Op   Principal Problem:   Strangulated inguinal hernia Active Problems:   Anemia   Hypertension   Stroke ([place])   HLD (hyperlipidemia)   Depression with anxiety   Coma ([place])   Knee must remain in immobilzer (in extension) at all times.  Follow up in office in 2 weeks for evaluation with Dr. Harlow Mares. Call office to confirm appointment [phone] ext [phone] [facility])   Carlynn Spry , PA-C 12/08/2020, 6:19 AM

## 2020-12-08 NOTE — Progress Notes (Signed)
Initial Nutrition Assessment  DOCUMENTATION CODES:  Not applicable  INTERVENTION:  Continue current diet as ordered per SLP, encourage PO intake. Advance as able to upgraded consistency Nursing to assist with feeding if family is not present to help. Nepro Shake po BID, each supplement provides 425 kcal and 19 grams protein MVI with minerals daily Magic cup TID with meals, each supplement provides 290 kcal and 9 grams of protein  NUTRITION DIAGNOSIS:  Inadequate oral intake related to dysphagia, decreased appetite as evidenced by per patient/family report, meal completion < 50%.  GOAL:  Patient will meet greater than or equal to 90% of their needs  MONITOR:  PO intake, Supplement acceptance  REASON FOR ASSESSMENT:  Consult Enteral/tube feeding initiation and management  ASSESSMENT:  80 y.o. female with hx of HLD, osteoarthritis, schizoaffective disorder, HTN, and vitamin d deficiency  presented to ED from home with 3 days of progressive generalized abdominal pain with nausea and occasional nonbloody nonbilious emesis. Imaging in ED showed an incarcerated hernia causing SBO  Surgery was consulted and recommended emergent repair due to likelihood of strangulated bowel. Taken for open right femoral hernia repair with mesh, small bowel resection 9/22.  Code Stroke was called for pt 9/24 after a change in mental status occurred. Determined to have had an acute stroke. Remained minimally interactive but was able to have diet advanced 9/27. NGT initially in place but was removed by surgery team 9/27.  Pt resting in bed at the time of visit, family present at bedside assists with providing nutrition hx. Report that pt has eaten a little of her meals today, is not too enthusiastic about the nectar thick liquids -  but SLP to work with pt again today and may be able to adjust diet order. Pt reports she is feeling ok today. No significant/consistent muscle or fat depletions noted. Family  reports that pt likes sweets, will add Magic cup and Nepro to augment intake. Pt prefers chocolate flavors, if able to advance to thin liquids will adjust to ensure so flavor will be available.  Average Meal Intake: 9/23: 100% intake x 1 recorded meals  Nutritionally Relevant Medications: Scheduled Meds:  atorvastatin  40 mg Per Tube Daily   docusate sodium  100 mg Oral BID   rosuvastatin  10 mg Oral Daily   sertraline  25 mg Oral Daily   Continuous Infusions:  sodium chloride 75 mL/hr at 12/08/20 0537   piperacillin-tazobactam (ZOSYN)  IV 3.375 g (12/08/20 0535)   PRN Meds: metoCLOPramide, ondansetron  Labs Reviewed  NUTRITION - FOCUSED PHYSICAL EXAM: Flowsheet Row Most Recent Value  Orbital Region No depletion  Upper Arm Region No depletion  Thoracic and Lumbar Region No depletion  Buccal Region No depletion  Temple Region Mild depletion  Clavicle Bone Region No depletion  Clavicle and Acromion Bone Region No depletion  Scapular Bone Region No depletion  Dorsal Hand No depletion  Patellar Region No depletion  Anterior Thigh Region No depletion  Posterior Calf Region Mild depletion  Edema (RD Assessment) Mild  Hair Reviewed  Eyes Reviewed  Mouth Reviewed  Skin Reviewed  Nails Reviewed   Diet Order:   Diet Order             DIET - DYS 1 Room service appropriate? Yes; Fluid consistency: Nectar Thick  Diet effective now                  EDUCATION NEEDS:  Education needs have been addressed  Skin:  Skin  Assessment: Reviewed RN Assessment  Last BM:  9/28 - type 6  Height:  Ht Readings from Last 1 Encounters:  12/04/20 5\' 3"  (1.6 m)    Weight:  Wt Readings from Last 1 Encounters:  12/08/20 86 kg    Ideal Body Weight:  52.3 kg  BMI:  Body mass index is 33.59 kg/m.  Estimated Nutritional Needs:  Kcal:  1500-1700 kcal/d Protein:  75-85 g/d Fluid:  1.5-1.8L/d  Ranell Patrick, RD, LDN Clinical Dietitian Pager on [place]

## 2020-12-08 NOTE — TOC Initial Note (Signed)
Transition of Care [facility]) - Initial/Assessment Note    Patient Details  Name: [name] MRN: [id number] Date of Birth: March 30, 1940  Transition of Care [facility]) CM/SW Contact:    Candie Chroman, LCSW Phone Number: 12/08/2020, 3:40 PM  Clinical Narrative:  Patient not fully oriented. No family at bedside. CSW called patient's daughters, introduced role, and explained that therapy recommendations would be discussed. They are agreeable to SNF referral being sent out but would like to discuss with palliative NP. Sent her a secure chat to notify. Left CMS scores in the room for them to review. Will follow up once bed offers available. Daughters say she will not be able to return home and will need long-term care placement after rehab. No further concerns. CSW encouraged patient's daughters to contact CSW as needed. CSW will continue to follow patient and her daughters for support and facilitate discharge to SNF once medically stable.                Expected Discharge Plan: Skilled Nursing Facility Barriers to Discharge: Continued Medical Work up   Patient Goals and CMS Choice   CMS Medicare.gov Compare Post Acute Care list provided to:: Patient Represenative (must comment) (Left in room for daughters.)    Expected Discharge Plan and Services Expected Discharge Plan: [place] Choice: [place] Living arrangements for the past 2 months: Single Family Home                                      Prior Living Arrangements/Services Living arrangements for the past 2 months: Single Family Home Lives with:: Self Patient language and need for interpreter reviewed:: Yes Do you feel safe going back to the place where you live?: No      Need for Family Participation in Patient Care: Yes (Comment) Care giver support system in place?: Yes (comment)   Criminal Activity/Legal Involvement Pertinent to Current Situation/Hospitalization:  No - Comment as needed  Activities of Daily Living Home Assistive Devices/Equipment: Environmental consultant (specify type) (ROLLING WALKER) ADL Screening (condition at time of admission) Patient's cognitive ability adequate to safely complete daily activities?: Yes Is the patient deaf or have difficulty hearing?: No Does the patient have difficulty seeing, even when wearing glasses/contacts?: No Does the patient have difficulty concentrating, remembering, or making decisions?: No Patient able to express need for assistance with ADLs?: No Does the patient have difficulty dressing or bathing?: No Independently performs ADLs?: Yes (appropriate for developmental age) Does the patient have difficulty walking or climbing stairs?: No Weakness of Legs: None Weakness of Arms/Hands: None  Permission Sought/Granted Permission sought to share information with : Facility Sport and exercise psychologist, Family Supports    Share Information with NAME: Albesa Seen and [place] granted to share info w AGENCY: SNF's  Permission granted to share info w Relationship: Daughters  Permission granted to share info w Contact Information: Caryl Pina: [phone]: [phone]  Emotional Assessment Appearance:: Appears stated age Attitude/Demeanor/Rapport: Unable to Assess Affect (typically observed): Unable to Assess Orientation: : Oriented to Self, Oriented to Place Alcohol / Substance Use: Not Applicable Psych Involvement: No (comment)  Admission diagnosis:  Small bowel obstruction ([place]) [K56.609] Incarcerated inguinal hernia [K40.30] Strangulated inguinal hernia [K40.30] Patient Active Problem List   Diagnosis Date Noted   Stroke ([place]) 12/05/2020   HLD (hyperlipidemia) 12/05/2020   Depression with anxiety 12/05/2020  Coma ([place]) 12/05/2020   Acute encephalopathy    Strangulated inguinal hernia 12/02/2020   Anxiety 11/25/2020   Aortic valve stenosis 09/09/2020   Prediabetes 05/17/2020    Hypertension 05/10/2020   Age-related osteoporosis without current pathological fracture 01/03/2016   Pure hypercholesterolemia 01/03/2016   Elevated TSH 01/03/2016   Primary osteoarthritis of both knees 12/06/2015   Vitamin D deficiency 06/16/2014   Personal history of other malignant neoplasm of skin 02/19/2014   Prolapse of vaginal vault 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 08/14/2013   Patient on low glycemic diet 08/02/2012   Knee pain 08/02/2012   Gynecological examination 06/20/2011   Neuropathy 06/20/2011   Uterine prolapse 06/20/2011   Anemia 06/18/2009   HYPOKALEMIA, MILD 06/25/2008   INTESTINAL OBSTRUCTION, HX OF 12/11/2007   BLEEDING, POSTMENOPAUSAL 10/23/2006   PERSONALITY DISORDER 10/22/2006   TREMOR, ESSENTIAL 10/22/2006   TRANSAMINASES, SERUM, ELEVATED 10/22/2006   PCP:  Jon Billings, NP Pharmacy:   [facility] [phone] [address], [address] [place] [address] Phone: [phone] Fax: [phone]     Social Determinants of Health (SDOH) Interventions    Readmission Risk Interventions No flowsheet data found.

## 2020-12-08 NOTE — Progress Notes (Signed)
Neurology Progress Note  Patient ID: 80 yo woman admitted for strangulated hernia repair found unresponsive on POD #3. MRI brain revealed extensive bilateral R>L watershed infarcts felt to be related to hypotension the prior day (early Sat AM). Please see full consult note by Dr. Cheral Marker 9/24 and his f/u note on 9/25. I was asked by Dr. Wynelle Cleveland to return today per daughters' request to discuss stroke and prognosis.  S: The patient reports that she is still feeling weak but better. She has flickers of movement in her fingers and toes on the left side only. On the right she is anti-gravity RUE>RLE. Spirits are good considering. Working with PT/OT.  O:  Vitals:   12/08/20 1203 12/08/20 1706  BP: (!) 174/55 (!) 159/58  Pulse: 65 79  Resp: 19 20  Temp: 97.9 F (36.6 C) 98.2 F (36.8 C)  SpO2: 96% 96%    Physical Exam Gen: alert and oriented to self, hospital, and daughters, NAD HEENT: Atraumatic, normocephalic; oropharynx clear, tongue without atrophy or fasciculations. Resp: CTAB, normal work of breathing CV: RRR, extremities appear well-perfused.  Neuro: *MS: alert and oriented to self, hospital, and daughters, NAD. Follows simple commands. *Speech: mild dysarthria, no aphasia, able to name and repeat. *CN:    I: Deferred   II,III: PERRLA, VFF by confrontation, optic discs not visualized 2/2 pupillary constriction   III,IV,VI: EOMI w/o nystagmus, no ptosis   V: Sensation intact from V1 to V3 to LT   VII: Eyelid closure was full.  L UMN facial droop   VIII: Hearing intact to voice   IX,X: Voice normal, palate elevates symmetrically    XI: SCM/trap 5/5 bilat   XII: Tongue protrudes midline, no atrophy or fasciculations  *Motor:   Normal bulk.  No tremor, bradykinesia. No pronator drift. RUE anti-gravity, RLE briefly anti-gravity but falls to bed. Briefly flickers fingers and toes on L side, no prox movement. Increased tone LUE *Sensory: SILT *Coordination:  UTA 2/2 confusion and  weakness *Reflexes:  2+ more brisk on L throughout without clonus; toes w/d only bilat *Gait: deferred  Prior imaging:  MRI brain reveals patchy acute cortical and subcortical watershed type ischemic infarcts involving the bilateral cerebral hemispheres, right worse than left. No associated hemorrhage or mass effect. Underlying moderately advanced chronic microvascular ischemic disease. CTA of neck: The common carotid, internal carotid and vertebral arteries are patent within the neck without hemodynamically significant stenosis (50% or greater).  CTA of head:  No intracranial large vessel occlusion identified. Atherosclerotic irregularity of the M2 and more distal middle cerebral artery vessels bilaterally. Most notably, there are sites of up to severe stenosis within proximal right M2 MCA vessels.   A/P:  80 yo woman admitted for strangulated hernia repair found unresponsive on POD #3. MRI brain revealed extensive bilateral R>L watershed infarcts felt to be related to hypotension the prior day (early Sat AM).    I was asked by Dr. Wynelle Cleveland to come speak to patient's daughters to review the MRI images and discuss prognosis. We spent approximately 25 minutes at bedside reviewing the actual MRI images, the mechanism of watershed infarcts, and what they can expect going forward. I explained that it is not possible to predict how much function she will gain back on her right side, and that overall the watershed infarcts were extensinve. I did encourage them to stay positive, that stroke patients may continue to slowly improve up to a year, especially with PT/OT/SLP. They asked excellent questions which I answered to  the best of my ability. By the end of the visit they said they did not have any more questions at this time.  1. Given that her acute strokes are due to hypotension rather than clot formation, starting ASA would not be high priority, especially given her recent major surgery 2. IVF 3. Keep  her SBP above 130 and less than 180. Will need close BP monitoring.  4. She is developing increased tone in her LUE. Working with OT will be v important to reduce contracture formation. 5. Neurology will follow PRN. Please call if there are additional questions.   Su Monks, MD [facility] [phone]  If 7pm- 7am, please page neurology on call as listed in [place].

## 2020-12-08 NOTE — NC FL2 (Signed)
[place] LEVEL OF CARE SCREENING TOOL     IDENTIFICATION  Patient Name: [name] Birthdate: Jul 13, 1940 Sex: female Admission Date (Current Location): 12/02/2020  [facility] and [place] Number:  Engineering geologist and Address:  [facility], [address], [place], [place] [address]      Provider Number: [id number]  Attending Physician Name and Address:  Debbe Odea, MD  Relative Name and Phone Number:       Current Level of Care: Hospital Recommended Level of Care: [place] Prior Approval Number:    Date Approved/Denied:   PASRR Number: [id number]  Discharge Plan: SNF    Current Diagnoses: Patient Active Problem List   Diagnosis Date Noted   Stroke [facility]) 12/05/2020   HLD (hyperlipidemia) 12/05/2020   Depression with anxiety 12/05/2020   Coma ([place]) 12/05/2020   Acute encephalopathy    Strangulated inguinal hernia 12/02/2020   Anxiety 11/25/2020   Aortic valve stenosis 09/09/2020   Prediabetes 05/17/2020   Hypertension 05/10/2020   Age-related osteoporosis without current pathological fracture 01/03/2016   Pure hypercholesterolemia 01/03/2016   Elevated TSH 01/03/2016   Primary osteoarthritis of both knees 12/06/2015   Vitamin D deficiency 06/16/2014   Personal history of other malignant neoplasm of skin 02/19/2014   Prolapse of vaginal vault 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 08/14/2013   Patient on low glycemic diet 08/02/2012   Knee pain 08/02/2012   Gynecological examination 06/20/2011   Neuropathy 06/20/2011   Uterine prolapse 06/20/2011   Anemia 06/18/2009   HYPOKALEMIA, MILD 06/25/2008   INTESTINAL OBSTRUCTION, HX OF 12/11/2007   BLEEDING, POSTMENOPAUSAL 10/23/2006   PERSONALITY DISORDER 10/22/2006   TREMOR, ESSENTIAL 10/22/2006   TRANSAMINASES, SERUM, ELEVATED 10/22/2006    Orientation RESPIRATION BLADDER Height & Weight     Self, Place  Normal Incontinent Weight: 189 lb 9.5 oz (86 kg) (bed  weight) Height:  5\' 3"  (160 cm)  BEHAVIORAL SYMPTOMS/MOOD NEUROLOGICAL BOWEL NUTRITION STATUS   (None)  (None) Continent Diet (DYS 1)  AMBULATORY STATUS COMMUNICATION OF NEEDS Skin   Extensive Assist Verbally Bruising, Other (Comment), Surgical wounds (Blister, cracking, rash. Incision right groin (honeycomb) and right knee (no dressing).)                       Personal Care Assistance Level of Assistance  Bathing, Feeding, Dressing Bathing Assistance: Maximum assistance Feeding assistance: Limited assistance Dressing Assistance: Maximum assistance     Functional Limitations Info  Sight, Hearing, Speech Sight Info: Adequate Hearing Info: Adequate Speech Info: Adequate (Delayed responses)    SPECIAL CARE FACTORS FREQUENCY  PT (By licensed PT), OT (By licensed OT), Speech therapy     PT Frequency: 5 x week OT Frequency: 5 x week     Speech Therapy Frequency: 5 x week      Contractures Contractures Info: Not present    Additional Factors Info  Code Status, Allergies, Psychotropic Code Status Info: DNR Allergies Info: Penicillins Psychotropic Info: Anxiety, depression         Current Medications (12/08/2020):  This is the current hospital active medication list Current Facility-Administered Medications  Medication Dose Route Frequency Provider Last Rate Last Admin   0.9 %  sodium chloride infusion   Intravenous PRN Lovell Sheehan, MD 10 mL/hr at 12/08/20 0533 250 mL at 12/08/20 0533   acetaminophen (TYLENOL) suppository 650 mg  650 mg Rectal Q6H PRN Lovell Sheehan, MD       acetaminophen (TYLENOL) tablet 650 mg  650 mg Oral Q6H PRN Lovell Sheehan, MD       amLODipine (NORVASC) tablet 10 mg  10 mg Oral QHS Rizwan, Eunice Blase, MD       carvedilol (COREG) tablet 6.25 mg  6.25 mg Oral BID WC Debbe Odea, MD       Chlorhexidine Gluconate Cloth 2 % PADS 6 each  6 each Topical Daily Lovell Sheehan, MD   6 each at 12/07/20 0804   dextrose 50 % solution 50 mL  50 mL  Intravenous PRN Lovell Sheehan, MD       docusate sodium (COLACE) capsule 100 mg  100 mg Oral BID Lovell Sheehan, MD   100 mg at 12/08/20 0834   enoxaparin (LOVENOX) injection 40 mg  40 mg Subcutaneous Q24H Lovell Sheehan, MD   40 mg at 12/07/20 0813   feeding supplement (NEPRO CARB STEADY) liquid 237 mL  237 mL Oral BID BM Rizwan, Saima, MD       hydrALAZINE (APRESOLINE) injection 5 mg  5 mg Intravenous Q2H PRN Lovell Sheehan, MD   5 mg at 12/08/20 0408   HYDROcodone-acetaminophen (NORCO/VICODIN) 5-325 MG per tablet 1-2 tablet  1-2 tablet Oral Q4H PRN Lovell Sheehan, MD   1 tablet at 12/06/20 1251   MEDLINE mouth rinse  15 mL Mouth Rinse BID Lovell Sheehan, MD   15 mL at 12/08/20 0012   metoCLOPramide (REGLAN) tablet 5-10 mg  5-10 mg Oral Q8H PRN Lovell Sheehan, MD       Or   metoCLOPramide (REGLAN) injection 5-10 mg  5-10 mg Intravenous Q8H PRN Lovell Sheehan, MD       ondansetron (ZOFRAN-ODT) disintegrating tablet 4 mg  4 mg Oral Q6H PRN Lovell Sheehan, MD       Or   ondansetron [facility]) injection 4 mg  4 mg Intravenous Q6H PRN Lovell Sheehan, MD       piperacillin-tazobactam (ZOSYN) IVPB 3.375 g  3.375 g Intravenous Q8H Lovell Sheehan, MD 12.5 mL/hr at 12/08/20 0535 3.375 g at 12/08/20 0535   [START ON 12/09/2020] rosuvastatin (CRESTOR) tablet 10 mg  10 mg Oral Daily Rizwan, Eunice Blase, MD       sertraline (ZOLOFT) tablet 25 mg  25 mg Oral Daily Lovell Sheehan, MD   25 mg at 12/08/20 1610   traMADol (ULTRAM) tablet 50 mg  50 mg Oral Q6H PRN Lovell Sheehan, MD   50 mg at 12/07/20 0005     Discharge Medications: Please see discharge summary for a list of discharge medications.  Relevant Imaging Results:  Relevant Lab Results:   Additional Information SS#: [id number]. COVID vaccines: 09/11/19 (Johnson&Johnson), 01/31/20 (Moderna). May have had second booster based on chart review.  Candie Chroman, LCSW

## 2020-12-08 NOTE — Evaluation (Signed)
Occupational Therapy Evaluation Patient Details Name: [name] MRN: [id number] DOB: 08-25-1940 Today's Date: 12/08/2020   History of Present Illness Pt admitted for strangulated inguinal hernia with complaints of N/V and abdominal pain. History includes anemia, HLD, and OA. She is now s/p R femoral hernia repair with mesh. Pt initially evaluated by PT on 9/23. On 9/24 pt had a code stroke called and imaging revealed B watershead infarcts R>L per neurology. 9/26 pt found to have R knee posteriorly subluxed with unclear etiology. Pt underwent closed reduction on 9/27 and is to remain WBAT with Knee Immobilizer on at all times.   Clinical Impression   [name] was seen for OT Re-Evaluation this date. Pt has demonstrated significant functional decline since time of initial evaluation 2/2 acute CVA with watershed infarcts. Pt now presents with significant L hemi-neglect and paresis. Her RUE (dominant) is also noted to be significantly impaired with grossly 3/5 strength t/o the shoulder/arm and minimal [facility] appreciated. Pt currently has orders for her RLE to remain WBAT with knee immobilizer on at all times. Pt is A&Ox3 during session, but daughter at bedside reports cognition has been fluctuating since the stroke. [name] requires MOD A for bed-level self feeding, drinking, and grooming tasks. She requires MAX-TOTAL assist for bed level toileting and LB ADL management. POC updated to reflect pt current functional abilities. She will benefit from skilled OT services during this hospitalization to maximize independence, safety, and minimize caregiver burden. Recommend STR for discharge at this time.      Recommendations for follow up therapy are one component of a multi-disciplinary discharge planning process, led by the attending physician.  Recommendations may be updated based on patient status, additional functional criteria and insurance authorization.   Follow Up Recommendations   SNF;Supervision/Assistance - 24 hour    Equipment Recommendations   (Defer to next venue of care pt likely to require hospital bed, wheel chair, BSC, etc. upon DC home.)    Recommendations for Other Services       Precautions / Restrictions Precautions Precautions: Fall Required Braces or Orthoses: Knee Immobilizer - Right Knee Immobilizer - Right: On at all times Restrictions Weight Bearing Restrictions: Yes RLE Weight Bearing: Weight bearing as tolerated      Mobility Bed Mobility Overal bed mobility: Needs Assistance Bed Mobility: Rolling Rolling: +2 for physical assistance;Total assist         General bed mobility comments: TOTAL A for rolling side to side and boost up in bed this date. TOTAL A for repositioning, however pt noted to briefly engage in bed mobility independently and shifts hips/trunk for comfort.    Transfers                 General transfer comment: deferred for pt safety and comfort.    Balance Overall balance assessment: Needs assistance                                         ADL either performed or assessed with clinical judgement   ADL Overall ADL's : Needs assistance/impaired                                       General ADL Comments: Pt is significantly functionally limited by decreased functional use of BLE/BUE. She requires MOD Assist for  self-feeding with her RUE (Dominant) at bed level and MOD A for grooming this date. She requires multimodal cueing for initiation and sequencing of tasks. Anticipate MAX-TOTAL A for LB ADL management. Bed level toileting at this time.     Vision Baseline Vision/History: 1 Wears glasses Patient Visual Report: No change from baseline Vision Assessment?: Vision impaired- to be further tested in functional context     Perception     Praxis      Pertinent Vitals/Pain Pain Assessment: Faces Faces Pain Scale: Hurts little more Pain Location: Pt denies pain but  appears to grimmace with movmements. Pain Descriptors / Indicators: Grimacing;Guarding Pain Intervention(s): Limited activity within patient's tolerance;Monitored during session;Repositioned     Hand Dominance Right   Extremity/Trunk Assessment Upper Extremity Assessment Upper Extremity Assessment: RUE deficits/detail;LUE deficits/detail RUE Deficits / Details: Pt is able to flex shoulder/elowbo against gravity. Significantly decreased [facility] appreciated. She requires assist to bring hand to mouth during self feeding. RUE Coordination: decreased gross motor;decreased fine motor LUE Deficits / Details: Zero to trace palpable active ROM for pt L hand, wrist, and shoulder. Tone appears variable t/o session with increased flexor tone appreciated intermittently but does not sustain. Will continue to monitor. Pt reports sensation of "pins and needles" in LUE this date. LUE Sensation: decreased light touch;decreased proprioception LUE Coordination: decreased gross motor;decreased fine motor   Lower Extremity Assessment Lower Extremity Assessment: Defer to PT evaluation;RLE deficits/detail;LLE deficits/detail RLE Deficits / Details: RLE to remain WBAT with KI donned at all times. LLE Deficits / Details: L hip flexion appreciated with voluntary movements, but pt unable to follow commands for MMT of her LLE       Communication Communication Communication: No difficulties   Cognition Arousal/Alertness: Lethargic Behavior During Therapy: Flat affect Overall Cognitive Status: Impaired/Different from baseline Area of Impairment: Orientation;Following commands;Awareness;Attention;Problem solving;Safety/judgement                   Current Attention Level: Focused   Following Commands: Follows one step commands inconsistently;Follows one step commands with increased time Safety/Judgement: Decreased awareness of deficits Awareness: Intellectual Problem Solving: Slow processing;Decreased  initiation;Difficulty sequencing;Requires verbal cues;Requires tactile cues General Comments: Pt is oriented to self, date, & limited place. Per daughter at bedside, RN had just reviewed A&O questions prior to OT arrival. Pt is aware she has had a stroke, but demonstrates limited awareness of deficits.   General Comments  BP monitored during session. Noted to be 173/59. RN notified. Per RN this is down from previous readings this am.    Exercises Other Exercises Other Exercises: Extensive time taken for pt/caregiver educated on PROM for LUE, positional strategies to [facility] safety, functional use, and minimize edema in LUE, compensatory strategies for self feeding including use of built up handle (x2 provided) and universal cuff for improved grip on utensil, strategies to promote improved attention to pt's L side including environmental modifications (room re-arranged for daughter to sit to pt's L side), and considerations for DC to STR vs home with 24/7 assist. Pt education limied 2/2 cognition. Pt daughter return verbalizes understanding of education provided and return demos use of self-feeding strategies during session.   Shoulder Instructions      Home Living Family/patient expects to be discharged to:: Private residence Living Arrangements: Alone   Type of Home: House Home Access: Level entry     Home Layout: One level               Home Equipment: Environmental consultant - 2 wheels  Prior Functioning/Environment Level of Independence: Independent with assistive device(s)        Comments: uses RW for all mobility; manages ADL/IADL independently. Was driving, active and independent prior to admission.        OT Problem List: Decreased strength;Decreased coordination;Decreased range of motion;Decreased cognition;Decreased activity tolerance;Decreased safety awareness;Impaired balance (sitting and/or standing);Decreased knowledge of use of DME or AE;Impaired UE functional  use;Impaired vision/perception;Impaired tone;Impaired sensation;Pain      OT Treatment/Interventions: Self-care/ADL training;Therapeutic exercise;Modalities;Energy conservation;DME and/or AE instruction;Patient/family education;Visual/perceptual remediation/compensation;Cognitive remediation/compensation;Therapeutic activities;Neuromuscular education;Balance training;Manual therapy;Splinting    OT Goals(Current goals can be found in the care plan section) Acute Rehab OT Goals Patient Stated Goal: "to wash my face" OT Goal Formulation: With patient Time For Goal Achievement: 12/22/20 Potential to Achieve Goals: Good ADL Goals Pt Will Perform Eating: with adaptive utensils;with min assist Pt Will Perform Grooming: sitting;with min assist;with adaptive equipment Pt Will Perform Upper Body Bathing: sitting;with adaptive equipment;with mod assist Pt Will Transfer to Toilet: with mod assist;with +2 assist;squat pivot transfer;bedside commode Pt Will Perform Toileting - Clothing Manipulation and hygiene: with mod assist;sitting/lateral leans  OT Frequency: Min 2X/week   Barriers to D/C: Decreased caregiver support;Inaccessible home environment          Co-evaluation              AM-PAC OT "6 Clicks" Daily Activity     Outcome Measure Help from another person eating meals?: A Lot Help from another person taking care of personal grooming?: A Lot Help from another person toileting, which includes using toliet, bedpan, or urinal?: Total Help from another person bathing (including washing, rinsing, drying)?: A Lot Help from another person to put on and taking off regular upper body clothing?: A Lot Help from another person to put on and taking off regular lower body clothing?: Total 6 Click Score: 10   End of Session Nurse Communication: Mobility status;Weight bearing status  Activity Tolerance: Patient tolerated treatment well Patient left: in bed;with call bell/phone within  reach;with bed alarm set;with family/visitor present  OT Visit Diagnosis: Other abnormalities of gait and mobility (R26.89);Muscle weakness (generalized) (M62.81);Hemiplegia and hemiparesis Hemiplegia - Right/Left:  (Both L>R) Hemiplegia - dominant/non-dominant: Dominant;Non-Dominant Hemiplegia - caused by: Cerebral infarction                Time: 3491-7915 OT Time Calculation (min): 86 min Charges:  OT General Charges $OT Visit: 1 Visit OT Evaluation $OT Re-eval: 1 Re-eval OT Treatments $Self Care/Home Management : 53-67 mins  Shara Blazing, M.S., OTR/L Ascom: [phone] 12/08/20, 11:46 AM

## 2020-12-09 DIAGNOSIS — K4030 Unilateral inguinal hernia, with obstruction, without gangrene, not specified as recurrent: Secondary | ICD-10-CM | POA: Diagnosis not present

## 2020-12-09 DIAGNOSIS — F418 Other specified anxiety disorders: Secondary | ICD-10-CM | POA: Diagnosis not present

## 2020-12-09 DIAGNOSIS — T148XXA Other injury of unspecified body region, initial encounter: Secondary | ICD-10-CM | POA: Diagnosis not present

## 2020-12-09 DIAGNOSIS — I6389 Other cerebral infarction: Secondary | ICD-10-CM | POA: Diagnosis not present

## 2020-12-09 DIAGNOSIS — Z7189 Other specified counseling: Secondary | ICD-10-CM | POA: Diagnosis not present

## 2020-12-09 LAB — CREATININE, SERUM
Creatinine, Ser: 0.44 mg/dL (ref 0.44–1.00)
GFR, Estimated: >60 mL/min (ref >60)

## 2020-12-09 MED ORDER — ENSURE ENLIVE PO LIQD
237.0000 mL | Freq: Two times a day (BID) | ORAL | Status: DC
  Administered 2020-12-09 – 2020-12-12 (×5): 237 mL via ORAL

## 2020-12-09 MED ORDER — PIPERACILLIN-TAZOBACTAM 3.375 G IVPB
3.3750 g | Freq: Three times a day (TID) | INTRAVENOUS | Status: AC
  Administered 2020-12-09 – 2020-12-10 (×3): 3.375 g via INTRAVENOUS
  Filled 2020-12-09 (×3): qty 50

## 2020-12-09 NOTE — TOC Progression Note (Signed)
Transition of Care [facility]) - Progression Note    Patient Details  Name: [name] MRN: [id number] Date of Birth: 1941-02-16  Transition of Care [facility]) CM/SW Contact  Shelbie Hutching, RN Phone Number: 12/09/2020, 3:01 PM  Clinical Narrative:    Current bed offers presented to patient's daughter's.  They did not like any of the current facilities.  RNCM will reach out to [facility], [place] and [place] [facility] to see if they can offer beds.     Expected Discharge Plan: [place] Barriers to Discharge: Continued Medical Work up  Expected Discharge Plan and Services Expected Discharge Plan: [place] Choice: [place] arrangements for the past 2 months: Single Family Home                                       Social Determinants of Health (SDOH) Interventions    Readmission Risk Interventions No flowsheet data found.

## 2020-12-09 NOTE — Progress Notes (Signed)
.  [name] Subjective:  CC: [name] is a 80 y.o. female  Hospital stay day 7, 7 days post opstrangulated right femoral hernia repair with mesh, small bowel resection  HPI: No acute issues overnight. Communicating well today.  Mult BM recorded past 24hrs.  Tolerating puree diet per speech   Objective:   Temp:  [97.6 F (36.4 C)-98.4 F (36.9 C)] 97.6 F (36.4 C) (09/29 0030) Pulse Rate:  [61-79] 61 (09/29 0030) Resp:  [17-20] 17 (09/29 0030) BP: (140-174)/(55-91) 152/55 (09/29 0030) SpO2:  [96 %] 96 % (09/29 0030) Weight:  [86 kg] 86 kg (09/28 1101)     Height: 5\' 3"  (160 cm) Weight: 86 kg (bed weight) BMI (Calculated): 33.59   Intake/Output this shift:   Intake/Output Summary (Last 24 hours) at 12/09/2020 0953 Last data filed at 12/09/2020 0526 Gross per 24 hour  Intake 427.94 ml  Output --  Net 427.94 ml    Constitutional :  no distress and slowed mentation  Respiratory:  clear to auscultation bilaterally  Cardiovascular:  regular rate and rhythm  Gastrointestinal: Soft, no guarding, no TTP in abdomen.  Bruising resolved around right groin, staples c/d/I.    Skin: Cool and moist.   Psychiatric:  non-agitated, not confused       LABS:  CMP Latest Ref Rng & Units 12/09/2020 12/08/2020 12/07/2020  Glucose 70 - 99 mg/dL - 119(H) 93  BUN 8 - 23 mg/dL - 18 27(H)  Creatinine 0.44 - 1.00 mg/dL 0.44 0.55 0.58  Sodium 135 - 145 mmol/L - 136 141  Potassium 3.5 - 5.1 mmol/L - 3.6 3.4(L)  Chloride 98 - 111 mmol/L - 106 108  CO2 22 - 32 mmol/L - 20(L) 20(L)  Calcium 8.9 - 10.3 mg/dL - 8.8(L) 8.9  Total Protein 6.5 - 8.1 g/dL - - -  Total Bilirubin 0.3 - 1.2 mg/dL - - -  Alkaline Phos 38 - 126 U/L - - -  AST 15 - 41 U/L - - -  ALT 0 - 44 U/L - - -   CBC Latest Ref Rng & Units 12/08/2020 12/07/2020 12/06/2020  WBC 4.0 - 10.5 K/uL 9.7 10.2 9.8  Hemoglobin 12.0 - 15.0 g/dL 12.5 11.0(L) 11.2(L)  Hematocrit 36.0 - 46.0 % 38.1 32.7(L) 33.6(L)  Platelets 150 - 400 K/uL 248  262 219    RADS: N/a Assessment:   S/p strangulated right femoral hernia repair with mesh, small bowel resection. Recovered well from surgery standpoint.  One more day of abx. Staples removed at bedside.  Ok to d/c from surgery standpoint once proper arrangements made from stroke recovery perspective.  Please call with additional questions.

## 2020-12-09 NOTE — Progress Notes (Signed)
Physical Therapy Treatment Patient Details Name: [name] MRN: [id number] DOB: 02/12/1941 Today's Date: 12/09/2020   History of Present Illness Pt admitted for strangulated inguinal hernia with complaints of N/V and abdominal pain. History includes anemia, HLD, and OA. She is now s/p R femoral hernia repair with mesh. Pt initially evaluated by PT on 9/23. On 9/24 pt had a code stroke called and imaging revealed B watershead infarcts R>L per neurology. 9/26 pt found to have R knee posteriorly subluxed with unclear etiology. Pt underwent closed reduction on 9/27 and is to remain WBAT with Knee Immobilizer on at all times.    PT Comments    Pt up in chair with OT this am.  Tech asking for help to get pt back to bed.  +2 max a squat pivot transfer to recliner at bedside.  Little to no assist by pt for transfer.  She is returned to supine with max a x 2.  Asked for bedpan and was assisted on.  Family in and will assist pt to call when she is done.    Recommendations for follow up therapy are one component of a multi-disciplinary discharge planning process, led by the attending physician.  Recommendations may be updated based on patient status, additional functional criteria and insurance authorization.  Follow Up Recommendations  SNF;Supervision/Assistance - 24 hour     Equipment Recommendations  Other (comment) (TBD by next level of care)    Recommendations for Other Services       Precautions / Restrictions Precautions Precautions: Fall Required Braces or Orthoses: Knee Immobilizer - Right Knee Immobilizer - Right: On at all times Restrictions Weight Bearing Restrictions: Yes RLE Weight Bearing: Weight bearing as tolerated     Mobility  Bed Mobility Overal bed mobility: Needs Assistance Bed Mobility: Sit to Supine     Supine to sit: Max assist Sit to supine: Total assist;+2 for physical assistance   General bed mobility comments: Pt moves B LEs towards EOB but  needing assistance with trunk and scooting    Transfers Overall transfer level: Needs assistance Equipment used: None Transfers: Stand Pivot Transfers Sit to Stand: Max assist;+2 physical assistance;Total assist Stand pivot transfers: Max assist;Total assist;+2 physical assistance      Lateral/Scoot Transfers: Total assist General transfer comment: Pt putting forth good effort but performing ~ 10% of transfer on her own. Daughter present to steady equipment.  Ambulation/Gait                 Stairs             Wheelchair Mobility    Modified Rankin (Stroke Patients Only)       Balance Overall balance assessment: Needs assistance Sitting-balance support: Single extremity supported;Feet supported;Bilateral upper extremity supported Sitting balance-Leahy Scale: Fair Sitting balance - Comments: close supervision and pt weight bearing thru B UEs for static sitting   Standing balance support: Bilateral upper extremity supported Standing balance-Leahy Scale: Zero                              Cognition Arousal/Alertness: Awake/alert Behavior During Therapy: Flat affect Overall Cognitive Status: Impaired/Different from baseline Area of Impairment: Orientation;Following commands;Awareness;Attention;Problem solving;Safety/judgement                 Orientation Level: Disoriented to;Time;Situation Current Attention Level: Focused   Following Commands: Follows one step commands inconsistently;Follows one step commands with increased time Safety/Judgement: Decreased awareness of deficits Awareness: Intellectual  Problem Solving: Slow processing;Decreased initiation;Difficulty sequencing;Requires verbal cues;Requires tactile cues General Comments: max multimodal cuing with increased time for sequencing and initiation. Manual facilitation of full head turn to locate items to the L      Exercises      General Comments        Pertinent  Vitals/Pain Pain Assessment: No/denies pain Faces Pain Scale: Hurts little more Pain Location: L knee Pain Descriptors / Indicators: Grimacing;Discomfort Pain Intervention(s): Limited activity within patient's tolerance;Monitored during session;Repositioned    Home Living                      Prior Function            PT Goals (current goals can now be found in the care plan section) Acute Rehab PT Goals Patient Stated Goal: to get into chair Progress towards PT goals: Progressing toward goals    Frequency    Min 2X/week      PT Plan      Co-evaluation              AM-PAC PT "6 Clicks" Mobility   Outcome Measure  Help needed turning from your back to your side while in a flat bed without using bedrails?: Total Help needed moving from lying on your back to sitting on the side of a flat bed without using bedrails?: Total Help needed moving to and from a bed to a chair (including a wheelchair)?: Total Help needed standing up from a chair using your arms (e.g., wheelchair or bedside chair)?: Total Help needed to walk in hospital room?: Total Help needed climbing 3-5 steps with a railing? : Total 6 Click Score: 6    End of Session   Activity Tolerance: Patient tolerated treatment well Patient left: in bed;with call bell/phone within reach;with bed alarm set;with SCD's reapplied Nurse Communication: Mobility status PT Visit Diagnosis: Muscle weakness (generalized) (M62.81);Hemiplegia and hemiparesis;Other abnormalities of gait and mobility (R26.89) Hemiplegia - Right/Left: Left Hemiplegia - caused by: Cerebral infarction Pain - Right/Left: Right Pain - part of body: Knee     Time: 9030-0923 PT Time Calculation (min) (ACUTE ONLY): 15 min  Charges:  $Therapeutic Activity: 8-22 mins                    Chesley Noon, PTA 12/09/20, 12:07 PM

## 2020-12-09 NOTE — Progress Notes (Signed)
Occupational Therapy Treatment Patient Details Name: [name] MRN: [id number] DOB: [birth year] Today's Date: 12/09/2020   History of present illness Pt admitted for strangulated inguinal hernia with complaints of N/V and abdominal pain. History includes anemia, HLD, and OA. She is now s/p R femoral hernia repair with mesh. Pt initially evaluated by PT on 9/23. On 9/24 pt had a code stroke called and imaging revealed B watershead infarcts R>L per neurology. 9/26 pt found to have R knee posteriorly subluxed with unclear etiology. Pt underwent closed reduction on 9/27 and is to remain WBAT with Knee Immobilizer on at all times.   OT comments  Upon entering the room, pt supine in bed and agreeable to OT intervention. Focus on L visual scanning with pt needing manual facilitation for full head turn and increased time to locate items and L UE with R hand. PROM of L UE in all planes of movement with no pain noted and shoulder moving safely. Hand over hand assistance for R UE to hold comb for hair. Pt needing increased time and max multimodal cuing to initiate and sequence tasks. Pt's daughter present during session as well and requests pt attempt transfer to recliner chair. Supine >sit with max A to EOB with pt moving B LEs towards EOB. Assist with trunk and scooting. Total A lateral scoot to the R side into recliner chair with plan for pt to eat lunch sitting up in chair. All needs within reach.   Recommendations for follow up therapy are one component of a multi-disciplinary discharge planning process, led by the attending physician.  Recommendations may be updated based on patient status, additional functional criteria and insurance authorization.    Follow Up Recommendations  SNF;Supervision/Assistance - 24 hour    Equipment Recommendations  Other (comment) (defer to next venue of care)       Precautions / Restrictions Precautions Precautions: Fall Required Braces or Orthoses: Knee  Immobilizer - Right Knee Immobilizer - Right: On at all times Restrictions Weight Bearing Restrictions: Yes RLE Weight Bearing: Weight bearing as tolerated       Mobility Bed Mobility Overal bed mobility: Needs Assistance Bed Mobility: Supine to Sit     Supine to sit: Max assist     General bed mobility comments: Pt moves B LEs towards EOB but needing assistance with trunk and scooting    Transfers Overall transfer level: Needs assistance   Transfers: Lateral/Scoot Transfers          Lateral/Scoot Transfers: Total assist General transfer comment: Pt putting forth good effort but performing ~ 10% of transfer on her own. Daughter present to steady equipment.    Balance Overall balance assessment: Needs assistance Sitting-balance support: Single extremity supported;Feet supported;Bilateral upper extremity supported Sitting balance-Leahy Scale: Fair Sitting balance - Comments: close supervision and pt weight bearing thru B UEs for static sitting                                   ADL either performed or assessed with clinical judgement     Vision Patient Visual Report: No change from baseline            Cognition Arousal/Alertness: Awake/alert Behavior During Therapy: Flat affect Overall Cognitive Status: Impaired/Different from baseline Area of Impairment: Orientation;Following commands;Awareness;Attention;Problem solving;Safety/judgement                 Orientation Level: Disoriented to;Time;Situation Current Attention Level: Focused  Following Commands: Follows one step commands inconsistently;Follows one step commands with increased time Safety/Judgement: Decreased awareness of deficits Awareness: Intellectual Problem Solving: Slow processing;Decreased initiation;Difficulty sequencing;Requires verbal cues;Requires tactile cues General Comments: max multimodal cuing with increased time for sequencing and initiation. Manual facilitation  of full head turn to locate items to the L                   Pertinent Vitals/ Pain       Pain Assessment: Faces Faces Pain Scale: Hurts little more Pain Location: L knee Pain Descriptors / Indicators: Grimacing;Discomfort Pain Intervention(s): Limited activity within patient's tolerance;Monitored during session;Repositioned         Frequency  Min 2X/week        Progress Toward Goals  OT Goals(current goals can now be found in the care plan section)  Progress towards OT goals: Progressing toward goals  Acute Rehab OT Goals Patient Stated Goal: to get into chair OT Goal Formulation: With patient/family Time For Goal Achievement: 12/22/20 Potential to Achieve Goals: Good  Plan Discharge plan remains appropriate;Frequency remains appropriate       AM-PAC OT "6 Clicks" Daily Activity     Outcome Measure   Help from another person eating meals?: A Lot Help from another person taking care of personal grooming?: A Lot Help from another person toileting, which includes using toliet, bedpan, or urinal?: Total Help from another person bathing (including washing, rinsing, drying)?: A Lot Help from another person to put on and taking off regular upper body clothing?: A Lot Help from another person to put on and taking off regular lower body clothing?: Total 6 Click Score: 10    End of Session    OT Visit Diagnosis: Other abnormalities of gait and mobility (R26.89);Muscle weakness (generalized) (M62.81);Hemiplegia and hemiparesis Hemiplegia - dominant/non-dominant: Dominant;Non-Dominant Hemiplegia - caused by: Cerebral infarction   Activity Tolerance Patient tolerated treatment well   Patient Left in bed;with call bell/phone within reach;with bed alarm set;with family/visitor present   Nurse Communication Mobility status        Time: 1020-1105 OT Time Calculation (min): 45 min  Charges: OT General Charges $OT Visit: 1 Visit OT Treatments $Neuromuscular  Re-education: 38-52 mins  Darleen Crocker, MS, OTR/L , CBIS ascom [phone]  12/09/20, 11:32 AM

## 2020-12-09 NOTE — Progress Notes (Signed)
PROGRESS NOTE    [name]   PJK:[id number]  DOB: October 11, 1940  DOA: 12/02/2020 PCP: Jon Billings, NP   Brief Narrative:  [name] is an 80 y.o. female with a past medical history of hypertension, hyperlipidemia, prediabetes, depression with anxiety, schizoaffective disorder, anemia, skin cancer, was admitted by the surgical team for strangulated inguinal hernia. The patient underwent open right femoral hernia repair with mesh and small bowel resection on 9/22. 9/24, rapid response was called the patient was found unresponsive.  Subsequently, code stroke was called.  On exam the patient was noted to have sonorous respirations while her eyes were wide open and did not respond to any external stimuli.  She had extensor posturing.  CT of the head showed a questionable acute cortical infarct within the right parietal lobe. MRI of the brain revealed bilateral MCA/ACA territory watershed infarcts.  It was noted that the patient's blood pressures had been low earlier in the morning.  During hospitalization, the patient was discovered to have a dislocation of the right knee.  An orthopedics consult was requested on 9/26.  And underwent reduction of the knee on 9/27 under IV anesthesia.  Subjective: Quite sleepy again this morning.  She has no complaints.       Assessment & Plan:   Principal Problem:   Strangulated inguinal hernia -She underwent right femoral hernia repair with mesh and small bowel resection on 9/22 - Continue management per general surgical team -she continues to receive Zosyn surgery has recommended 1 more day of this - NG tube removed 9/27-diet has been downgraded to thin liquids-she is eating and drinking quite well  Active Problems: Bilateral cerebral hemispheric watershed infarcts related to postop hypotension -Patient was obtunded immediately after above-mentioned infarcts - She is beginning to awaken now - Goal blood pressures initially  were between 130-180 - As BP was not 196/74 on 9/28, I increased her amlodipine from 5 to 10 mg -Her last blood pressure was 152/55 - Continue to follow BPs today  Right knee dislocation status post reduction on 9/27 - Per orthopedic surgery, the knee must remain in an immobilizer at all times and patient will need to follow-up in 2 weeks with Dr. Harlow Mares - Continue pain control and physical therapy efforts    Hypertension -See above in regards to blood pressure medications    HLD (hyperlipidemia) -Continue Crestor    Depression with anxiety -Continue sertraline    Time spent in minutes: 35 DVT prophylaxis: SCDs Start: 12/07/20 1702 enoxaparin (LOVENOX) injection 40 mg Start: 12/03/20 0800 Code Status: DNR Family Communication: Plan discussed with daughter, Romelle Starcher- Her daughter was at bedside and I have discussed the plan with her and answered all of her questions. Level of Care: Level of care: Med-Surg Disposition Plan:  Status is: Inpatient  Remains inpatient appropriate because:Inpatient level of care appropriate due to severity of illness  Dispo: The patient is from: Home              Anticipated d/c is to: SNF              Patient currently is not medically stable to d/c.   Difficult to place patient No      Consultants:  General surgery Neurology Orthopedic surgery  Procedures:  open right femoral hernia repair with mesh and small bowel resection  Right knee reduction Antimicrobials:  Anti-infectives (From admission, onward)    Start     Dose/Rate Route Frequency Ordered Stop   12/09/20 1400  piperacillin-tazobactam (ZOSYN) IVPB 3.375 g        3.375 g 12.5 mL/hr over 240 Minutes Intravenous Every 8 hours 12/09/20 0905 12/10/20 1359   12/02/20 2200  piperacillin-tazobactam (ZOSYN) IVPB 3.375 g  Status:  Discontinued        3.375 g 12.5 mL/hr over 240 Minutes Intravenous Every 8 hours 12/02/20 2138 12/09/20 0905   12/02/20 1827  ceFAZolin (ANCEF) IVPB  2g/100 mL premix  Status:  Discontinued       Note to Pharmacy: On call to OR   2 g 200 mL/hr over 30 Minutes Intravenous 60 min pre-op 12/02/20 1819 12/02/20 2212        Objective: Vitals:   12/08/20 1203 12/08/20 1706 12/08/20 1953 12/09/20 0030  BP: (!) 174/55 (!) 159/58 (!) 140/91 (!) 152/55  Pulse: 65 79 72 61  Resp: 19 20 18 17   Temp: 97.9 F (36.6 C) 98.2 F (36.8 C) 98.4 F (36.9 C) 97.6 F (36.4 C)  TempSrc:      SpO2: 96% 96% 96% 96%  Weight:      Height:        Intake/Output Summary (Last 24 hours) at 12/09/2020 1116 Last data filed at 12/09/2020 1032 Gross per 24 hour  Intake 307.94 ml  Output --  Net 307.94 ml    Filed Weights   12/04/20 0756 12/04/20 2111 12/08/20 1101  Weight: 82.5 kg 76.7 kg 86 kg    Examination: General exam: Appears comfortable  HEENT: PERRLA, oral mucosa moist, no sclera icterus or thrush Respiratory system: Clear to auscultation. Respiratory effort normal. Cardiovascular system: S1 & S2 heard, regular rate and rhythm Gastrointestinal system: Abdomen soft, non-tender, nondistended. Normal bowel sounds   Central nervous system: Alert and oriented.  Very limited movement of her left arm and leg Extremities: No cyanosis, clubbing-left forearm and hand noted to be edematous today - Right leg is in an immobilizer Skin: No rashes or ulcers Psychiatry:  Mood & affect appropriate.      Data Reviewed: I have personally reviewed following labs and imaging studies  CBC: Recent Labs  Lab 12/04/20 0535 12/05/20 0636 12/06/20 0430 12/07/20 0346 12/08/20 0419  WBC 11.6* 9.8 9.8 10.2 9.7  HGB 10.4* 11.6* 11.2* 11.0* 12.5  HCT 29.8* 35.3* 33.6* 32.7* 38.1  MCV 87.4 88.3 90.8 88.9 88.0  PLT 184 201 219 262 314    Basic Metabolic Panel: Recent Labs  Lab 12/04/20 0535 12/05/20 0636 12/06/20 0430 12/07/20 0346 12/08/20 0419 12/09/20 0504  NA 134* 138 141 141 136  --   K 3.4* 3.4* 3.4* 3.4* 3.6  --   CL 96* 100 108 108 106   --   CO2 25 24 24  20* 20*  --   GLUCOSE 128* 100* 105* 93 119*  --   BUN 48* 55* 35* 27* 18  --   CREATININE 1.31* 0.99 0.55 0.58 0.55 0.44  CALCIUM 8.6* 9.1 8.8* 8.9 8.8*  --   MG  --  2.2  --  1.8  --   --   PHOS  --  3.5  --   --   --   --     GFR: Estimated Creatinine Clearance: 57.3 mL/min (by C-G formula based on SCr of 0.44 mg/dL). Liver Function Tests: Recent Labs  Lab 12/02/20 1232  AST 33  ALT 16  ALKPHOS 57  BILITOT 2.1*  PROT 8.1  ALBUMIN 4.8    Recent Labs  Lab 12/02/20 1232  LIPASE 25  No results for input(s): AMMONIA in the last 168 hours. Coagulation Profile: No results for input(s): INR, PROTIME in the last 168 hours. Cardiac Enzymes: No results for input(s): CKTOTAL, CKMB, CKMBINDEX, TROPONINI in the last 168 hours. BNP (last 3 results) No results for input(s): PROBNP in the last 8760 hours. HbA1C: No results for input(s): HGBA1C in the last 72 hours. CBG: Recent Labs  Lab 12/04/20 1204 12/04/20 2112 12/04/20 2337 12/05/20 0804 12/07/20 1544  GLUCAP 99 87 86 103* 106*    Lipid Profile: No results for input(s): CHOL, HDL, LDLCALC, TRIG, CHOLHDL, LDLDIRECT in the last 72 hours. Thyroid Function Tests: No results for input(s): TSH, T4TOTAL, FREET4, T3FREE, THYROIDAB in the last 72 hours. Anemia Panel: No results for input(s): VITAMINB12, FOLATE, FERRITIN, TIBC, IRON, RETICCTPCT in the last 72 hours. Urine analysis:    Component Value Date/Time   COLORURINE AMBER BIOCHEMICALS MAY BE AFFECTED BY COLOR (A) 06/25/2008 1242   APPEARANCEUR Cloudy (A) 08/31/2020 1039   LABSPEC 1.023 06/25/2008 1242   PHURINE 6.0 06/25/2008 1242   GLUCOSEU Negative 08/31/2020 1039   HGBUR NEGATIVE 06/25/2008 1242   BILIRUBINUR Negative 08/31/2020 1039   KETONESUR >80 (A) 06/25/2008 1242   PROTEINUR 1+ (A) 08/31/2020 1039   PROTEINUR NEGATIVE 06/25/2008 1242   UROBILINOGEN 0.2 06/25/2008 1242   NITRITE Positive (A) 08/31/2020 1039   NITRITE NEGATIVE  06/25/2008 1242   LEUKOCYTESUR 2+ (A) 08/31/2020 1039   Sepsis Labs: @LABRCNTIP (procalcitonin:4,lacticidven:4) ) Recent Results (from the past 240 hour(s))  Resp Panel by RT-PCR (Flu A&B, Covid) Nasopharyngeal Swab     Status: None   Collection Time: 12/02/20  4:39 PM   Specimen: Nasopharyngeal Swab; Nasopharyngeal(NP) swabs in vial transport medium  Result Value Ref Range Status   SARS Coronavirus 2 by RT PCR NEGATIVE NEGATIVE Final    Comment: (NOTE) SARS-CoV-2 target nucleic acids are NOT DETECTED.  The SARS-CoV-2 RNA is generally detectable in upper respiratory specimens during the acute phase of infection. The lowest concentration of SARS-CoV-2 viral copies this assay can detect is 138 copies/mL. A negative result does not preclude SARS-Cov-2 infection and should not be used as the sole basis for treatment or other patient management decisions. A negative result may occur with  improper specimen collection/handling, submission of specimen other than nasopharyngeal swab, presence of viral mutation(s) within the areas targeted by this assay, and inadequate number of viral copies(<138 copies/mL). A negative result must be combined with clinical observations, patient history, and epidemiological information. The expected result is Negative.  Fact Sheet for Patients:  EntrepreneurPulse.com.au  Fact Sheet for Healthcare Providers:  IncredibleEmployment.be  This test is no t yet approved or cleared by the [place] FDA and  has been authorized for detection and/or diagnosis of SARS-CoV-2 by FDA under an Emergency Use Authorization (EUA). This EUA will remain  in effect (meaning this test can be used) for the duration of the COVID-19 declaration under Section 564(b)(1) of the Act, 21 U.S.C.section 360bbb-3(b)(1), unless the authorization is terminated  or revoked sooner.       Influenza A by PCR NEGATIVE NEGATIVE Final   Influenza B by  PCR NEGATIVE NEGATIVE Final    Comment: (NOTE) The Xpert Xpress SARS-CoV-2/FLU/RSV plus assay is intended as an aid in the diagnosis of influenza from Nasopharyngeal swab specimens and should not be used as a sole basis for treatment. Nasal washings and aspirates are unacceptable for Xpert Xpress SARS-CoV-2/FLU/RSV testing.  Fact Sheet for Patients: EntrepreneurPulse.com.au  Fact Sheet for Healthcare Providers: IncredibleEmployment.be  This test is not yet approved or cleared by the [place] and has been authorized for detection and/or diagnosis of SARS-CoV-2 by FDA under an Emergency Use Authorization (EUA). This EUA will remain in effect (meaning this test can be used) for the duration of the COVID-19 declaration under Section 564(b)(1) of the Act, 21 U.S.C. section 360bbb-3(b)(1), unless the authorization is terminated or revoked.  Performed at [facility], [address] [place], [place] [address]   MRSA Next Gen by PCR, Nasal     Status: None   Collection Time: 12/04/20  9:05 PM   Specimen: Nasal Mucosa; Nasal Swab  Result Value Ref Range Status   MRSA by PCR Next Gen NOT DETECTED NOT DETECTED Final    Comment: (NOTE) The GeneXpert MRSA Assay (FDA approved for NASAL specimens only), is one component of a comprehensive MRSA colonization surveillance program. It is not intended to diagnose MRSA infection nor to guide or monitor treatment for MRSA infections. Test performance is not FDA approved in patients less than 60 years old. Performed at [facility], [address], [place], [place] [address]          Radiology Studies: DG Abd 1 View  Result Date: 12/07/2020 CLINICAL DATA:  Ileus EXAM: ABDOMEN - 1 VIEW COMPARISON:  12/02/2020 FINDINGS: Cutaneous staples over the low pelvic region. Overall nonobstructed gas pattern with resolution of previously noted dilated small bowel. Aortic atherosclerosis.  No suspicious calcifications. IMPRESSION: Nonobstructed bowel-gas pattern Electronically Signed   By: Donavan Foil M.D.   On: 12/07/2020 19:50   DG Knee Right Port  Result Date: 12/07/2020 CLINICAL DATA:  Surgical manipulation of right knee joint EXAM: PORTABLE RIGHT KNEE - 1-2 VIEW COMPARISON:  12/06/2020 FINDINGS: Frontal and cross-table lateral views of the right knee demonstrate reduction of prior dislocation. The 3 component right knee arthroplasty demonstrates anatomic alignment. No evidence of fracture. Small joint effusion. IMPRESSION: 1. Reduction of prior right knee arthroplasty dislocation, now with anatomic alignment. 2. Small joint effusion. Electronically Signed   By: Randa Ngo M.D.   On: 12/07/2020 17:52      Scheduled Meds:  amLODipine  10 mg Oral QHS   carvedilol  6.25 mg Oral BID WC   docusate sodium  100 mg Oral BID   enoxaparin (LOVENOX) injection  40 mg Subcutaneous Q24H   feeding supplement (NEPRO CARB STEADY)  237 mL Oral BID BM   mouth rinse  15 mL Mouth Rinse BID   rosuvastatin  10 mg Oral Daily   sertraline  25 mg Oral Daily   Continuous Infusions:  sodium chloride 10 mL/hr at 12/09/20 0526   piperacillin-tazobactam (ZOSYN)  IV       LOS: 7 days      Debbe Odea, MD [facility] Pager: [email or web address] 12/09/2020, 11:16 AM

## 2020-12-09 NOTE — Progress Notes (Signed)
Physical Therapy Treatment Patient Details Name: [name] MRN: [id number] DOB: December 18, 1940 Today's Date: 12/09/2020   History of Present Illness Pt admitted for strangulated inguinal hernia with complaints of N/V and abdominal pain. History includes anemia, HLD, and OA. She is now s/p R femoral hernia repair with mesh. Pt initially evaluated by PT on 9/23. On 9/24 pt had a code stroke called and imaging revealed B watershead infarcts R>L per neurology. 9/26 pt found to have R knee posteriorly subluxed with unclear etiology. Pt underwent closed reduction on 9/27 and is to remain WBAT with Knee Immobilizer on at all times.    PT Comments    Pt seen again today per family request to educate on exercises that they can complete to further progress. Daughter present throughout session and pt demonstrating increased volitional movement in LLE. Pt continues to require totalA for movement of L UE and demonstrates L sided neglect. Family educated on attempt to sit on L side to encourage patient to attend to that side. Pt will continue to benefit from skilled PT services to promote functional mobility, neuroplastic principles, and to improve quality of life.     Recommendations for follow up therapy are one component of a multi-disciplinary discharge planning process, led by the attending physician.  Recommendations may be updated based on patient status, additional functional criteria and insurance authorization.  Follow Up Recommendations  SNF     Equipment Recommendations  Other (comment) (TBD next level of care)    Recommendations for Other Services       Precautions / Restrictions Precautions Precautions: Fall Required Braces or Orthoses: Knee Immobilizer - Right Knee Immobilizer - Right: On at all times Restrictions Weight Bearing Restrictions: Yes RLE Weight Bearing: Weight bearing as tolerated     Mobility  Bed Mobility               General bed mobility comments:  Deferred at this time    Transfers                 General transfer comment: Deferred at this time  Ambulation/Gait                 Stairs             Wheelchair Mobility    Modified Rankin (Stroke Patients Only)       Balance             Cognition Arousal/Alertness: Awake/alert Behavior During Therapy: Flat affect Overall Cognitive Status: Impaired/Different from baseline        Exercises Other Exercises Other Exercises: Pt and family member educated on exercises to improve B LE strength and function. Pt continues to demonstrate L UE flaccidity however, demonstrates improvement in L LE hip flexion and ankle dorsiflexion. Completed all R sided exercises AROM: SLR x 8, ankle pumps x 15, hip abduction 1 x 10. All L sided exercises AAROM: Heel slide 1 x 10, SLR 1 x 10, hip abduction 1 x 10. Pt left laying supine in bed with daughter present and encouraged family to remain on L side for the patient to attend towards L. Pt demonstrating improved L head turn however, not complete and her eyes do not cross midline when cued to look left. All needs met    General Comments        Pertinent Vitals/Pain Pain Assessment: No/denies pain    Home Living  Prior Function            PT Goals (current goals can now be found in the care plan section) Acute Rehab PT Goals Patient Stated Goal: to get stronger PT Goal Formulation: With patient/family Time For Goal Achievement: 12/17/20 Potential to Achieve Goals: Poor Progress towards PT goals: Progressing toward goals    Frequency    7X/week      PT Plan Frequency needs to be updated    Co-evaluation              AM-PAC PT "6 Clicks" Mobility   Outcome Measure  Help needed turning from your back to your side while in a flat bed without using bedrails?: Total Help needed moving from lying on your back to sitting on the side of a flat bed without using bedrails?:  Total Help needed moving to and from a bed to a chair (including a wheelchair)?: Total Help needed standing up from a chair using your arms (e.g., wheelchair or bedside chair)?: Total Help needed to walk in hospital room?: Total Help needed climbing 3-5 steps with a railing? : Total 6 Click Score: 6    End of Session   Activity Tolerance: Patient tolerated treatment well Patient left: in bed;with call bell/phone within reach;with bed alarm set;with family/visitor present Nurse Communication: Mobility status PT Visit Diagnosis: Muscle weakness (generalized) (M62.81);Hemiplegia and hemiparesis;Other abnormalities of gait and mobility (R26.89) Hemiplegia - Right/Left: Left Hemiplegia - caused by: Cerebral infarction Pain - Right/Left: Right Pain - part of body: Knee     Time: 8882-8003 PT Time Calculation (min) (ACUTE ONLY): 12 min  Charges:  $Therapeutic Exercise: 8-22 mins                     Andrey Campanile, SPT    Andrey Campanile 12/09/2020, 4:09 PM

## 2020-12-09 NOTE — Care Management Important Message (Signed)
Important Message  Patient Details  Name: [name] MRN: [id number] Date of Birth: 1941/03/02   Medicare Important Message Given:  Yes     Dannette Barbara 12/09/2020, 2:45 PM

## 2020-12-10 ENCOUNTER — Encounter: Payer: Medicare PPO | Admitting: Nurse Practitioner

## 2020-12-10 DIAGNOSIS — M170 Bilateral primary osteoarthritis of knee: Secondary | ICD-10-CM

## 2020-12-10 DIAGNOSIS — I6389 Other cerebral infarction: Secondary | ICD-10-CM | POA: Diagnosis not present

## 2020-12-10 DIAGNOSIS — S83104A Unspecified dislocation of right knee, initial encounter: Secondary | ICD-10-CM | POA: Diagnosis not present

## 2020-12-10 DIAGNOSIS — K56609 Unspecified intestinal obstruction, unspecified as to partial versus complete obstruction: Secondary | ICD-10-CM

## 2020-12-10 DIAGNOSIS — Z515 Encounter for palliative care: Secondary | ICD-10-CM

## 2020-12-10 DIAGNOSIS — K4030 Unilateral inguinal hernia, with obstruction, without gangrene, not specified as recurrent: Secondary | ICD-10-CM | POA: Diagnosis not present

## 2020-12-10 DIAGNOSIS — N993 Prolapse of vaginal vault 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: Secondary | ICD-10-CM

## 2020-12-10 DIAGNOSIS — Z7189 Other specified counseling: Secondary | ICD-10-CM | POA: Diagnosis not present

## 2020-12-10 DIAGNOSIS — F418 Other specified anxiety disorders: Secondary | ICD-10-CM | POA: Diagnosis not present

## 2020-12-10 DIAGNOSIS — Z9889 Other specified postprocedural states: Secondary | ICD-10-CM

## 2020-12-10 LAB — SARS CORONAVIRUS 2 (TAT 6-24 HRS): SARS Coronavirus 2: NEGATIVE

## 2020-12-10 MED ORDER — LISINOPRIL 20 MG PO TABS
40.0000 mg | ORAL_TABLET | Freq: Every day | ORAL | Status: DC
  Administered 2020-12-10 – 2020-12-13 (×4): 40 mg via ORAL
  Filled 2020-12-10 (×4): qty 2

## 2020-12-10 MED ORDER — TIZANIDINE HCL 4 MG PO TABS: 4.0000 mg | ORAL_TABLET | Freq: Three times a day (TID) | ORAL | 0 refills | Status: DC | PRN

## 2020-12-10 MED ORDER — COVID-19MRNA BIVAL VAC MODERNA 50 MCG/0.5ML IM SUSP: 0.5000 mL | Freq: Once | INTRAMUSCULAR | Status: DC

## 2020-12-10 MED ORDER — ACETAMINOPHEN 325 MG PO TABS: 650.0000 mg | ORAL_TABLET | Freq: Four times a day (QID) | ORAL | Status: DC | PRN

## 2020-12-10 MED ORDER — COVID-19MRNA BIVAL VACC PFIZER 30 MCG/0.3ML IM SUSP
0.3000 mL | Freq: Once | INTRAMUSCULAR | Status: AC
  Administered 2020-12-11: 17:00:00 0.3 mL via INTRAMUSCULAR
  Filled 2020-12-10: qty 0.3

## 2020-12-10 MED ORDER — DOCUSATE SODIUM 100 MG PO CAPS: 100.0000 mg | ORAL_CAPSULE | Freq: Two times a day (BID) | ORAL | 0 refills | Status: DC

## 2020-12-10 MED ORDER — AMLODIPINE BESYLATE 10 MG PO TABS: 10.0000 mg | ORAL_TABLET | Freq: Every day | ORAL | Status: DC

## 2020-12-10 MED ORDER — ASPIRIN 81 MG PO CHEW
81.0000 mg | CHEWABLE_TABLET | Freq: Every day | ORAL | Status: DC
  Administered 2020-12-10 – 2020-12-13 (×4): 81 mg via ORAL
  Filled 2020-12-10 (×4): qty 1

## 2020-12-10 MED ORDER — ASPIRIN 81 MG PO CHEW: 81.0000 mg | CHEWABLE_TABLET | Freq: Every day | ORAL | Status: AC

## 2020-12-10 MED ORDER — HYDROCODONE-ACETAMINOPHEN 5-325 MG PO TABS: 1.0000 | ORAL_TABLET | ORAL | 0 refills | Status: DC | PRN

## 2020-12-10 NOTE — TOC Progression Note (Signed)
Transition of Care [facility]) - Progression Note    Patient Details  Name: [name] MRN: [id number] Date of Birth: 05-12-1940  Transition of Care [facility]) CM/SW Contact  Shelbie Hutching, RN Phone Number: 12/10/2020, 2:25 PM  Clinical Narrative:    [facility] Caraway in [facility] does not have any beds to offer.  [facility] has still not responded with an offer.    Expected Discharge Plan: [place] Barriers to Discharge: Continued Medical Work up  Expected Discharge Plan and Services Expected Discharge Plan: [place] Choice: [place] arrangements for the past 2 months: Single Family Home Expected Discharge Date: 12/10/20                                     Social Determinants of Health (SDOH) Interventions    Readmission Risk Interventions No flowsheet data found.

## 2020-12-10 NOTE — Progress Notes (Signed)
Nurse and NT will recheck BP after patient laying for a while. Patricia Lader, RN

## 2020-12-10 NOTE — TOC Progression Note (Signed)
Transition of Care [facility]) - Progression Note    Patient Details  Name: Aesha Agrawal MRN: [id number] Date of Birth: 10-Jun-1940  Transition of Care [facility]) CM/SW Contact  Shelbie Hutching, RN Phone Number: 12/10/2020, 3:44 PM  Clinical Narrative:    [facility] should be able to accept on Monday.  MD has ordered COVID booster to be administered.     Expected Discharge Plan: [place] Barriers to Discharge: Continued Medical Work up  Expected Discharge Plan and Services Expected Discharge Plan: [place] Choice: [place] arrangements for the past 2 months: Single Family Home Expected Discharge Date: 12/10/20                                     Social Determinants of Health (SDOH) Interventions    Readmission Risk Interventions No flowsheet data found.

## 2020-12-10 NOTE — TOC Progression Note (Signed)
Transition of Care [facility]) - Progression Note    Patient Details  Name: Boni Maclellan MRN: [id number] Date of Birth: 1940-10-17  Transition of Care [facility]) CM/SW Contact  Shelbie Hutching, RN Phone Number: 12/10/2020, 12:46 PM  Clinical Narrative:    [facility] has not been able to give me an answer- it has been sent to their clinical team.  Other 5 bed offers have been presented and MD has placed a discharge order.  RNCM has discussed with daughter that patient is medically ready to be discharged and has a safe discharge plan for anyone of the facilities offered.  If they do not want any of the available facilities they do have the option to take the patient home.   Daughter, Romelle Starcher is going to look at [place] and [facility] before deciding.   EMS will be going on inclement weather diversion sometime this afternoon.     Expected Discharge Plan: [place] Barriers to Discharge: Continued Medical Work up  Expected Discharge Plan and Services Expected Discharge Plan: [place] Choice: [place] arrangements for the past 2 months: Single Family Home Expected Discharge Date: 12/10/20                                     Social Determinants of Health (SDOH) Interventions    Readmission Risk Interventions No flowsheet data found.

## 2020-12-10 NOTE — Progress Notes (Signed)
Daily Progress Note   Patient Name: [name]       Date: 12/10/2020 DOB: [birth year]  Age: 80 y.o. MRN#: [id number] Attending Physician: Debbe Odea, MD Primary Care Physician: Jon Billings, NP Admit Date: 12/02/2020  Reason for Consultation/Follow-up: Establishing goals of care  Subjective: Patient is resting in bed with daughter at bedside. She denies complaint at this time. She answers questions with delayed responses. She is able to articulate her hospitalization accurately.   She states she would love to be able to drive again. She discussed her current functional status and abilities. Opened conversation on acceptable functional status and acceptable QOL. She states she will need to think about this. She confirms DNR/DNI status.   Length of Stay: 8  Current Medications: Scheduled Meds:   amLODipine  10 mg Oral QHS   carvedilol  6.25 mg Oral BID WC   docusate sodium  100 mg Oral BID   enoxaparin (LOVENOX) injection  40 mg Subcutaneous Q24H   feeding supplement  237 mL Oral BID BM   lisinopril  40 mg Oral Daily   mouth rinse  15 mL Mouth Rinse BID   rosuvastatin  10 mg Oral Daily   sertraline  25 mg Oral Daily    Continuous Infusions:  sodium chloride Stopped (12/09/20 1357)    PRN Meds: sodium chloride, acetaminophen, acetaminophen, dextrose, hydrALAZINE, HYDROcodone-acetaminophen, metoCLOPramide **OR** metoCLOPramide (REGLAN) injection, ondansetron **OR** ondansetron (ZOFRAN) IV, traMADol  Physical Exam Pulmonary:     Effort: Pulmonary effort is normal.  Neurological:     Mental Status: She is alert.            Vital Signs: BP (!) 165/60 (BP Location: Right Arm)   Pulse 66   Temp 98.7 F (37.1 C) (Oral)   Resp 20   Ht 5\' 3"  (1.6 m)   Wt 86 kg  Comment: bed weight  SpO2 96%   BMI 33.59 kg/m  SpO2: SpO2: 96 % O2 Device: O2 Device: Room Air O2 Flow Rate: O2 Flow Rate (L/min): 3 L/min  Intake/output summary:  Intake/Output Summary (Last 24 hours) at 12/10/2020 0926 Last data filed at 12/10/2020 0754 Gross per 24 hour  Intake 63.09 ml  Output 850 ml  Net -786.91 ml   LBM: Last BM Date: 12/09/20 Baseline Weight: Weight: 82.5 kg Most recent  weight: Weight: 86 kg (bed weight)     Patient Active Problem List   Diagnosis Date Noted   Stroke ([place]) 12/05/2020   HLD (hyperlipidemia) 12/05/2020   Depression with anxiety 12/05/2020   Coma ([place]) 12/05/2020   Acute encephalopathy    Strangulated inguinal hernia 12/02/2020   Anxiety 11/25/2020   Aortic valve stenosis 09/09/2020   Prediabetes 05/17/2020   Hypertension 05/10/2020   Age-related osteoporosis without current pathological fracture 01/03/2016   Pure hypercholesterolemia 01/03/2016   Elevated TSH 01/03/2016   Primary osteoarthritis of both knees 12/06/2015   Vitamin D deficiency 06/16/2014   Personal history of other malignant neoplasm of skin 02/19/2014   Prolapse of vaginal vault 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 08/14/2013   Patient on low glycemic diet 08/02/2012   Knee pain 08/02/2012   Gynecological examination 06/20/2011   Neuropathy 06/20/2011   Uterine prolapse 06/20/2011   Anemia 06/18/2009   HYPOKALEMIA, MILD 06/25/2008   INTESTINAL OBSTRUCTION, HX OF 12/11/2007   BLEEDING, POSTMENOPAUSAL 10/23/2006   PERSONALITY DISORDER 10/22/2006   TREMOR, ESSENTIAL 10/22/2006   TRANSAMINASES, SERUM, ELEVATED 10/22/2006    Palliative Care Assessment & Plan    Recommendations/Plan: Recommend outpatient palliative to follow over time for [place]. Time for outcomes.   Code Status:    Code Status Orders  (From admission, onward)           Start     Ordered   12/05/20 1037  Do not attempt resuscitation (DNR)  Continuous       Question Answer Comment  In the event of  cardiac or respiratory ARREST Do not call a "code blue"   In the event of cardiac or respiratory ARREST Do not perform Intubation, CPR, defibrillation or ACLS   In the event of cardiac or respiratory ARREST Use medication by any route, position, wound care, and other measures to relive pain and suffering. May use oxygen, suction and manual treatment of airway obstruction as needed for comfort.   Comments NO ESCALATION OF CARE      12/05/20 1037           Code Status History     Date Active Date Inactive Code Status Order ID Comments User Context   12/04/2020 2139 12/05/2020 1037 DNR [id number]  Renato Battles, NP Inpatient   12/03/2020 0322 12/04/2020 2139 Full Code [id number]  Benjamine Sprague, DO Inpatient       Thank you for allowing the Palliative Medicine Team to assist in the care of this patient.       Total Time 35 min Prolonged Time Billed  no       Greater than 50%  of this time was spent counseling and coordinating care related to the above assessment and plan.  Asencion Gowda, NP  Please contact Palliative Medicine Team phone at [phone] for questions and concerns.

## 2020-12-10 NOTE — Plan of Care (Signed)
Patient A&Ox2-3.  VSS.  Up to chair with PT this am. Right leg immobilizer remains in place.  Norco x1 for 7/10 leg pain with relief reported.  Eating 30% of meals.  Freq loose stools today.  Stool softener held this am.  Daughter visiting most of day.  Call bell within reach and bed alarm remains on for safety.  Will continue to monitor.  Problem: Education: Goal: Knowledge of General Education information will improve Description: Including pain rating scale, medication(s)/side effects and non-pharmacologic comfort measures Outcome: Progressing   Problem: Health Behavior/Discharge Planning: Goal: Ability to manage health-related needs will improve Outcome: Progressing   Problem: Clinical Measurements: Goal: Ability to maintain clinical measurements within normal limits will improve Outcome: Progressing Goal: Will remain free from infection Outcome: Progressing Goal: Diagnostic test results will improve Outcome: Progressing Goal: Respiratory complications will improve Outcome: Progressing Goal: Cardiovascular complication will be avoided Outcome: Progressing   Problem: Activity: Goal: Risk for activity intolerance will decrease Outcome: Progressing   Problem: Nutrition: Goal: Adequate nutrition will be maintained Outcome: Progressing   Problem: Coping: Goal: Level of anxiety will decrease Outcome: Progressing   Problem: Elimination: Goal: Will not experience complications related to bowel motility Outcome: Progressing Goal: Will not experience complications related to urinary retention Outcome: Progressing   Problem: Pain Managment: Goal: General experience of comfort will improve Outcome: Progressing   Problem: Safety: Goal: Ability to remain free from injury will improve Outcome: Progressing   Problem: Skin Integrity: Goal: Risk for impaired skin integrity will decrease Outcome: Progressing   Problem: Education: Goal: Knowledge of disease or condition will  improve Outcome: Progressing Goal: Knowledge of secondary prevention will improve Outcome: Progressing Goal: Knowledge of patient specific risk factors addressed and post discharge goals established will improve Outcome: Progressing   Problem: Coping: Goal: Will identify appropriate support needs Outcome: Progressing   Problem: Health Behavior/Discharge Planning: Goal: Ability to manage health-related needs will improve Outcome: Progressing   Problem: Self-Care: Goal: Ability to participate in self-care as condition permits will improve Outcome: Progressing Goal: Verbalization of feelings and concerns over difficulty with self-care will improve Outcome: Progressing Goal: Ability to communicate needs accurately will improve Outcome: Progressing   Problem: Nutrition: Goal: Risk of aspiration will decrease Outcome: Progressing Goal: Dietary intake will improve Outcome: Progressing   Problem: Ischemic Stroke/TIA Tissue Perfusion: Goal: Complications of ischemic stroke/TIA will be minimized Outcome: Progressing   Problem: Education: Goal: Required Educational Video(s) Outcome: Progressing   Problem: Clinical Measurements: Goal: Ability to maintain clinical measurements within normal limits will improve Outcome: Progressing Goal: Postoperative complications will be avoided or minimized Outcome: Progressing   Problem: Skin Integrity: Goal: Demonstration of wound healing without infection will improve Outcome: Progressing

## 2020-12-10 NOTE — H&P (Signed)
The patient has been re-examined, and the chart reviewed, and there have been no interval changes to the documented history and physical.  Plan a right knee relocation today.  Anesthesia is not consulted regarding a peripheral nerve block for post-operative pain.  The risks, benefits, and alternatives have been discussed at length, with the patient's two daughters and they have consented to proceed.

## 2020-12-10 NOTE — TOC Progression Note (Signed)
Transition of Care [facility]) - Progression Note    Patient Details  Name: [name] MRN: [id number] Date of Birth: [birth year]  Transition of Care [facility]) CM/SW Contact  Shelbie Hutching, RN Phone Number: 12/10/2020, 3:06 PM  Clinical Narrative:    Family is leaning toward [facility].  [place] visited and really liked the facility.  Message left for [facility] to see if they can accept the patient over the weekend.   Insurance Josem Kaufmann is approved.   Expected Discharge Plan: [place] Barriers to Discharge: Continued Medical Work up  Expected Discharge Plan and Services Expected Discharge Plan: [place] Choice: [place] arrangements for the past 2 months: Single Family Home Expected Discharge Date: 12/10/20                                     Social Determinants of Health (SDOH) Interventions    Readmission Risk Interventions No flowsheet data found.

## 2020-12-10 NOTE — Discharge Summary (Signed)
Physician Discharge Summary  [name] KDT:[id number] DOB: [birth year] DOA: 12/02/2020  PCP: Jon Billings, NP  Admit date: 12/02/2020 Discharge date: 12/10/2020  Admitted From: home  Disposition:  SNF   Recommendations for Outpatient Follow-up:  F/u on oral intake  Home Health:  none  Discharge Condition:  stable   CODE STATUS:  DNR   Diet recommendation:  heart healthy diet Consultations: General surgery Neurology Orthopedic surgery   Procedures/Studies: open right femoral hernia repair with mesh and small bowel resection  Right knee reduction   Discharge Diagnoses:  Active Problems:  Principal Problem:   Strangulated inguinal hernia Active Problems: 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[facility]) which Coma ([place])   Knee dislocation, right, initial encounter    Prolapse of vaginal vault 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 female with a past medical history of hypertension, hyperlipidemia, prediabetes, depression with anxiety, schizoaffective disorder, anemia, skin cancer, was admitted by the surgical team for strangulated inguinal hernia. The patient underwent open right femoral hernia repair with mesh and small bowel resection on 9/22. 9/24, rapid response was called the patient was found unresponsive.  Subsequently, code stroke was called.  On exam the patient was noted to have sonorous respirations while her eyes were wide open and did not respond to any external stimuli.  She had extensor posturing.  CT of the head showed a questionable acute cortical infarct within the right parietal lobe. MRI of the brain revealed bilateral MCA/ACA territory watershed infarcts.  It was noted that the patient's blood pressures had been low earlier in the morning.   During hospitalization, the patient was discovered to have a dislocation  of the right knee.  An orthopedics consult was requested on 9/26.  And underwent reduction of the knee on 9/27 under IV anesthesia.    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 She is confused to time and place and has left sided weakness noted on exam - she has been working with PT and is discharging to a SNF - has been started on a daily baby aspirin   Right knee dislocation status post reduction on 9/27 - Per orthopedic surgery, the knee must remain in an immobilizer at all times and patient will need to follow-up in 2 weeks with Dr. Harlow Mares - Continue pain control and physical therapy efforts     Hypertension -See above in regards to blood pressure medications     HLD (hyperlipidemia) -Continue Crestor     Depression with anxiety -Continue sertraline     Discharge Exam: Vitals:   12/10/20 0753 12/10/20 0832  BP: (!) 173/51 (!) 165/60  Pulse: 66   Resp: 20   Temp: 98.7 F (37.1 C)   SpO2: 96%    Vitals:   12/10/20 0033 12/10/20 0517 12/10/20 0753 12/10/20 0832  BP: (!) 151/57 (!) 159/55 Marland Kitchen)  173/51 (!) 165/60  Pulse: 66 66 66   Resp: 20 16 20    Temp: 98 F (36.7 C) 97.8 F (36.6 C) 98.7 F (37.1 C)   TempSrc: Oral  Oral   SpO2: 97% 98% 96%   Weight:      Height:        General: Pt is alert, awake, not in acute distress Cardiovascular: RRR, S1/S2 +, no rubs, no gallops Respiratory: CTA  bilaterally, no wheezing, no rhonchi Abdominal: Soft, NT, ND, bowel sounds + Extremities: no edema, no cyanosis   Discharge Instructions  Discharge Instructions     Diet - low sodium heart healthy   Complete by: As directed    Increase activity slowly   Complete by: As directed    No wound care   Complete by: As directed       Allergies as of 12/10/2020       Reactions   Penicillins Rash   TOLERATED CEFAZOLIN        Medication List     STOP taking these medications    traMADol 50 MG tablet Commonly known as: ULTRAM       TAKE these medications    acetaminophen 325 MG tablet Commonly known as: TYLENOL Take 2 tablets (650 mg total) by mouth every 6 (six) hours as needed for fever, headache or mild pain.   amLODipine 10 MG tablet Commonly known as: NORVASC Take 1 tablet (10 mg total) by mouth at bedtime. What changed:  medication strength how much to take   aspirin 81 MG chewable tablet Chew 1 tablet (81 mg total) by mouth daily.   carvedilol 6.25 MG tablet Commonly known as: COREG Take 1 tablet (6.25 mg total) by mouth 2 (two) times daily with a meal.   docusate sodium 100 MG capsule Commonly known as: COLACE Take 1 capsule (100 mg total) by mouth 2 (two) times daily.   gabapentin 100 MG capsule Commonly known as: NEURONTIN Take 1 capsule (100 mg total) by mouth at bedtime. Take 1 capsule at bedtime. If no relief, can go up to 2 tablets at bedtime after 3 days.   HYDROcodone-acetaminophen 5-325 MG tablet Commonly known as: NORCO/VICODIN Take 1 tablet by mouth every 4 (four) hours as needed for severe pain.   lisinopril 40 MG tablet Commonly known as: ZESTRIL Take 1 tablet (40 mg total) by mouth daily.   rosuvastatin 10 MG tablet Commonly known as: Crestor Take 1 tablet (10 mg total) by mouth daily.   sertraline 25 MG tablet Commonly known as: ZOLOFT Take 1 tablet (25 mg total) by mouth daily.   tiZANidine 4 MG tablet Commonly known as:  ZANAFLEX Take 1 tablet (4 mg total) by mouth every 8 (eight) hours as needed for muscle spasms. What changed:  when to take this reasons to take this   triamcinolone cream 0.1 % Commonly known as: KENALOG Apply 1 application topically 2 (two) times daily.        Allergies  Allergen Reactions   Penicillins Rash    TOLERATED CEFAZOLIN      CT ANGIO HEAD NECK W WO CM  Result Date: 12/04/2020 CLINICAL DATA:  Neuro deficit, acute, stroke suspected. Unresponsive. EXAM: CT ANGIOGRAPHY HEAD AND NECK TECHNIQUE: Multidetector CT imaging of the head and neck was performed using the standard protocol during bolus administration of intravenous contrast. Multiplanar CT image reconstructions and MIPs were obtained to evaluate the vascular anatomy. Carotid stenosis measurements (when applicable) are obtained utilizing NASCET criteria, using  the distal internal carotid diameter as the denominator. CONTRAST:  48mL OMNIPAQUE IOHEXOL 350 MG/ML SOLN COMPARISON:  No pertinent prior exams available for comparison. FINDINGS: CT HEAD FINDINGS Brain: Mild generalized cerebral atrophy. A subtle acute cortical infarct is questioned within the right parietal lobe (for instance as seen on series 3, image 27). Background advanced patchy and ill-defined hypoattenuation within the cerebral white matter, nonspecific but compatible with chronic small vessel ischemic disease. There is no acute intracranial hemorrhage. No extra-axial fluid collection. No evidence of an intracranial mass. No midline shift. Vascular: No hyperdense vessel.  Atherosclerotic calcifications. Skull: Normal. Negative for fracture or focal lesion. Sinuses: No significant paranasal sinus disease. Orbits: No mass or acute finding. Review of the MIP images confirms the above findings These results were called by telephone at the time of interpretation on 12/04/2020 at 12:55 pm to provider ERIC [facility] , who verbally acknowledged these results. CTA NECK  FINDINGS Aortic arch: Standard aortic branching. Atherosclerotic plaque within the visualized aortic arch and proximal major branch vessels of the neck. No hemodynamically significant innominate or proximal subclavian artery stenosis. Right carotid system: CCA and ICA patent within the neck without hemodynamically significant stenosis (50% or greater). Mild-to-moderate atherosclerotic plaque within the carotid bifurcation and proximal ICA. Left carotid system: CCA and ICA patent within the neck without hemodynamically significant stenosis (50% or greater). Moderate atherosclerotic plaque within the carotid bifurcation and proximal ICA. Vertebral arteries: Vertebral arteries patent within the neck bilaterally. Mild atherosclerotic narrowing at the origin of the right vertebral artery. Skeleton: Cervical spondylosis. No acute bony abnormality or aggressive osseous lesion. Other neck: No neck mass or cervical lymphadenopathy. Upper chest: Patchy opacity within the imaged left lower lobe pneumonia or atelectasis. Review of the MIP images confirms the above findings CTA HEAD FINDINGS Anterior circulation: The intracranial internal carotid arteries are patent. Atherosclerotic plaque within both vessels with up to moderate stenosis. The M1 middle cerebral arteries are patent. Significant atherosclerotic irregularity of the M2 and more distal middle cerebral artery vessels bilaterally. Most notably, there are sites of up to severe stenosis within the proximal M2 right MCA vessels. No proximal M2 branch occlusion is identified. The anterior cerebral arteries are patent. 2 mm inferiorly projecting vascular protrusion arising from the supraclinoid left ICA, likely reflecting an aneurysm. Posterior circulation: The intracranial vertebral arteries are patent. The basilar artery is patent. The posterior cerebral arteries are patent. Hypoplastic right P1 segment with sizable right posterior communicating artery. The left posterior  communicating artery is hypoplastic or absent Venous sinuses: Within the limitations of contrast timing, no convincing thrombus. Anatomic variants: As described Review of the MIP images confirms the above findings No intracranial large vessel occlusion identified. These results were called by telephone at the time of interpretation on 12/04/2020 at 12:56 pm to provider ERIC [facility] , who verbally acknowledged these results. IMPRESSION: CT head: 1. No evidence of acute intracranial hemorrhage. 2. A small acute cortical infarct is questioned within the right parietal lobe. 3. Advanced chronic small vessel ischemic changes within the cerebral white matter. 4. Generalized parenchymal atrophy. CTA neck: 1. The common carotid, internal carotid and vertebral arteries are patent within the neck without hemodynamically significant stenosis (50% or greater). 2. Opacity within the imaged left lower lobe, which may reflect atelectasis or pneumonia. CTA Head: 1. No intracranial large vessel occlusion identified. 2. Atherosclerotic irregularity of the M2 and more distal middle cerebral artery vessels bilaterally. Most notably, there are sites of up to severe stenosis within proximal right  M2 MCA vessels. 3. 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 Electronically Signed   By: Kellie Simmering D.O.   On: 12/04/2020 13:07   DG Knee 1-2 Views Right  Result Date: 12/06/2020 CLINICAL DATA:  RIGHT knee dislocation EXAM: RIGHT KNEE - 1-2 VIEW COMPARISON:  None. FINDINGS: Lateral projection demonstrates anterior dislocation of the femoral component in relation to the tibial component. No evidence of fracture. IMPRESSION: Anterior dislocation the knee joint. Electronically Signed   By: Suzy Bouchard M.D.   On: 12/06/2020 14:46   DG Abd 1 View  Result Date: 12/07/2020 CLINICAL DATA:  Ileus EXAM: ABDOMEN - 1 VIEW COMPARISON:  12/02/2020 FINDINGS: Cutaneous staples over the low pelvic region. Overall nonobstructed  gas pattern with resolution of previously noted dilated small bowel. Aortic atherosclerosis. No suspicious calcifications. IMPRESSION: Nonobstructed bowel-gas pattern Electronically Signed   By: Donavan Foil M.D.   On: 12/07/2020 19:50   DG Abd 1 View  Result Date: 12/02/2020 CLINICAL DATA:  NG tube placement. EXAM: ABDOMEN - 1 VIEW COMPARISON:  CT earlier today. FINDINGS: Placement of enteric tube, the tip and side port are within a large hiatal hernia in the lower thorax. Dilated small bowel in the central abdomen similar to CT earlier today. IMPRESSION: Enteric tube with tip and side-port in the large hiatal hernia in the lower thorax. Dilated small bowel in the central abdomen similar to CT earlier today. Electronically Signed   By: Keith Rake M.D.   On: 12/02/2020 21:23   MR ANGIO HEAD WO CONTRAST  Result Date: 12/04/2020 CLINICAL DATA:  Initial evaluation for neuro deficit, stroke suspected. EXAM: MRI HEAD WITHOUT CONTRAST MRA HEAD WITHOUT CONTRAST TECHNIQUE: Multiplanar, multi-echo pulse sequences of the brain and surrounding structures were acquired without intravenous contrast. Angiographic images of the Circle of Willis were acquired using MRA technique without intravenous contrast. COMPARISON:  CTA from earlier the same day. FINDINGS: MRI HEAD FINDINGS Brain: Cerebral volume within normal limits for age. Patchy T2/FLAIR hyperintensity involving the periventricular deep white matter both cerebral hemispheres as well as the pons, most consistent with chronic small vessel ischemic disease, moderate in nature. Scattered areas of restricted diffusion involving the cortical and subcortical aspect of the right frontal, parietal, and occipital lobes, consistent with acute ischemic infarcts. These are positioned in a linear watershed distribution. There are similar but less pronounced watershed type infarcts involving the contralateral left cerebral hemisphere, with involvement of the left frontal  and parietal lobes. No associated hemorrhage or mass effect. No other evidence for acute or chronic intracranial hemorrhage. No mass lesion, midline shift or mass effect. No hydrocephalus or extra-axial fluid collection. Pituitary gland suprasellar region normal. Midline structures intact. Vascular: Major intracranial vascular flow voids are maintained. Skull and upper cervical spine: Prominent osteoarthritic changes with degenerative thickening noted about the C1-2 articulation and tectorial membrane. No significant stenosis at the craniocervical junction. Bone marrow signal intensity within normal limits. No scalp soft tissue abnormality. Sinuses/Orbits: Globes and orbital soft tissues within normal limits. Paranasal sinuses are clear. No mastoid effusion. Inner ear structures grossly normal. Other: None. MRA HEAD FINDINGS Anterior circulation: Examination degraded by motion artifact. Visualized distal cervical segments of the internal carotid arteries are patent with antegrade flow. Petrous, cavernous, and supraclinoid segments patent without hemodynamically significant stenosis. 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 A1 segments patent. Normal anterior communicating artery complex. Anterior cerebral arteries patent without significant stenosis. No M1  stenosis or occlusion. Normal MCA bifurcations. Distal MCA branches perfused and symmetric. Distal small vessel atheromatous irregularity, better evaluated on prior CTA. Posterior circulation: Vertebral arteries widely patent to the vertebrobasilar junction. Right vertebral artery dominant. Both PICA origins patent and normal. Basilar patent to its distal aspect without stenosis. Superior cerebellar arteries patent bilaterally. Left PCA supplied via the basilar. Right PCA supplied via a hypoplastic right P1 segment and robust right posterior communicating artery. Both PCAs remain patent to their distal  aspects without appreciable stenosis. Distal small vessel atheromatous irregularity. Anatomic variants: Hypoplastic right P1 segment and robust right posterior communicating artery. IMPRESSION: MRI HEAD IMPRESSION: 1. 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 No associated hemorrhage or mass effect. 2. Underlying moderately advanced chronic microvascular ischemic disease. MRA HEAD IMPRESSION: 1. Negative intracranial MRA for large vessel occlusion. No hemodynamically significant or correctable stenosis. 2. Distal small vessel atheromatous irregularity, better evaluated on prior CTA. 3. 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 Electronically Signed   By: Jeannine Boga M.D.   On: 12/04/2020 21:22   MR BRAIN WO CONTRAST  Result Date: 12/04/2020 CLINICAL DATA:  Initial evaluation for neuro deficit, stroke suspected. EXAM: MRI HEAD WITHOUT CONTRAST MRA HEAD WITHOUT CONTRAST TECHNIQUE: Multiplanar, multi-echo pulse sequences of the brain and surrounding structures were acquired without intravenous contrast. Angiographic images of the Circle of Willis were acquired using MRA technique without intravenous contrast. COMPARISON:  CTA from earlier the same day. FINDINGS: MRI HEAD FINDINGS Brain: Cerebral volume within normal limits for age. Patchy T2/FLAIR hyperintensity involving the periventricular deep white matter both cerebral hemispheres as well as the pons, most consistent with chronic small vessel ischemic disease, moderate in nature. Scattered areas of restricted diffusion involving the cortical and subcortical aspect of the right frontal, parietal, and occipital lobes, consistent with acute ischemic infarcts. These are positioned in a linear watershed distribution. There are similar but less pronounced watershed type infarcts involving the contralateral left  cerebral hemisphere, with involvement of the left frontal and parietal lobes. No associated hemorrhage or mass effect. No other evidence for acute or chronic intracranial hemorrhage. No mass lesion, midline shift or mass effect. No hydrocephalus or extra-axial fluid collection. Pituitary gland suprasellar region normal. Midline structures intact. Vascular: Major intracranial vascular flow voids are maintained. Skull and upper cervical spine: Prominent osteoarthritic changes with degenerative thickening noted about the C1-2 articulation and tectorial membrane. No significant stenosis at the craniocervical junction. Bone marrow signal intensity within normal limits. No scalp soft tissue abnormality. Sinuses/Orbits: Globes and orbital soft tissues within normal limits. Paranasal sinuses are clear. No mastoid effusion. Inner ear structures grossly normal. Other: None. MRA HEAD FINDINGS Anterior circulation: Examination degraded by motion artifact. Visualized distal cervical segments of the internal carotid arteries are patent with antegrade flow. Petrous, cavernous, and supraclinoid segments patent without hemodynamically significant stenosis. 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 A1 segments patent. Normal anterior communicating artery complex. Anterior cerebral arteries patent without significant stenosis. No M1 stenosis or occlusion. Normal MCA bifurcations. Distal MCA branches perfused and symmetric. Distal small vessel atheromatous irregularity, better evaluated on prior CTA. Posterior circulation: Vertebral arteries widely patent to the vertebrobasilar junction. Right vertebral artery dominant. Both PICA origins patent and normal. Basilar patent to its distal aspect without stenosis. Superior cerebellar arteries patent bilaterally. Left PCA supplied via the  basilar. Right PCA supplied via a hypoplastic right P1 segment and robust right posterior  communicating artery. Both PCAs remain patent to their distal aspects without appreciable stenosis. Distal small vessel atheromatous irregularity. Anatomic variants: Hypoplastic right P1 segment and robust right posterior communicating artery. IMPRESSION: MRI HEAD IMPRESSION: 1. 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 No associated hemorrhage or mass effect. 2. Underlying moderately advanced chronic microvascular ischemic disease. MRA HEAD IMPRESSION: 1. Negative intracranial MRA for large vessel occlusion. No hemodynamically significant or correctable stenosis. 2. Distal small vessel atheromatous irregularity, better evaluated on prior CTA. 3. 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 Electronically Signed   By: Jeannine Boga M.D.   On: 12/04/2020 21:22   CT ABDOMEN PELVIS W CONTRAST  Result Date: 12/02/2020 CLINICAL DATA:  Abdominal pain, acute, nonlocalized. EXAM: CT ABDOMEN AND PELVIS WITH CONTRAST TECHNIQUE: Multidetector CT imaging of the abdomen and pelvis was performed using the standard protocol following bolus administration of intravenous contrast. CONTRAST:  61mL OMNIPAQUE IOHEXOL 350 MG/ML SOLN COMPARISON:  12/25/2011 FINDINGS: Lower chest: Lung bases are clear. Large hiatal hernia. There is a right Bochdalek hernia containing fat in the posterior right chest. No pleural effusions. Hepatobiliary: Normal appearance of the liver, gallbladder and portal venous system. No biliary dilatation. Pancreas: Unremarkable. No pancreatic ductal dilatation or surrounding inflammatory changes. Spleen: Normal in size without focal abnormality. Adrenals/Urinary Tract: Normal adrenal glands. Bilateral low-density renal cysts. However, there is a hyperdense exophytic structure along the anterior left kidney on sequence 2 image 35 that measures up to 1.0 cm. This  may represent a hyperdense cyst due to the fact that the Hounsfield units do not significantly change on the delayed imaging but this lesion is technically indeterminate. Negative for hydronephrosis. Mild distention of the urinary bladder. Stomach/Bowel: Large hiatal hernia. Dilated loops of small bowel in the abdomen and pelvis. Dilated loop of bowel extending into a right inguinal hernia and there is a transition point associated with this inguinal hernia. Stranding associated with this right inguinal hernia. Findings are suggestive for an incarcerated inguinal hernia. Distal small bowel is decompressed. Colon is decompressed. Vascular/Lymphatic: Diffuse atherosclerotic disease in the abdominal aorta without aneurysm. Mildly prominent right inguinal lymph nodes that may be reactive. Right iliac lymph node on series 2, image 57 measures 0.8 cm in the short axis. Otherwise, no significant abdominal or pelvic lymph node enlargement. Reproductive: Status post hysterectomy. No adnexal masses. Other: Bilateral inguinal hernias. Right inguinal hernia contains a dilated loop of small bowel. Left inguinal hernia contains fat. Negative for free air. Negative for ascites. Musculoskeletal: Small amount of stranding in the right groin and around the right inguinal hernia. Grade 1 anterolisthesis of L4 on L5 likely secondary to facet arthropathy. Disc space loss with endplate changes at P3-I9. IMPRESSION: 1. Small bowel obstruction secondary to an incarcerated right inguinal hernia. Subcutaneous edema in the right groin and right inguinal region likely secondary to the incarcerated hernia. 2. Bilateral renal cysts with an indeterminate hyperdense structure in the anterior left kidney. This could be more definitively characterized with a pre and post-contrast MRI. 3.  Aortic Atherosclerosis (ICD10-I70.0). 4. Left inguinal hernia containing fat. 5. Large hiatal hernia. 6. Right Bochdalek diaphragmatic hernia. These results were  called by telephone at the time of interpretation on 12/02/2020 at 2:16 pm to provider [facility] , who verbally acknowledged these results. Electronically Signed  By: Markus Daft M.D.   On: 12/02/2020 14:17   DG Chest Port 1 View  Result Date: 12/04/2020 CLINICAL DATA:  Dyspnea EXAM: PORTABLE CHEST 1 VIEW COMPARISON:  None. Findings are correlated with CT examination of the abdomen pelvis of 12/02/2020 FINDINGS: Lung volumes are small. No pneumothorax or pleural effusion. Right paratracheal soft tissue thickening may reflect vascular shadow on this rotated examination, however, a paramediastinal soft tissue mass or adenopathy could appear similarly. This is not well characterized on this examination. Cardiac size is within normal limits. Opacity within the right cardiophrenic angle may relate to the patient's known large hiatal hernia. Nasogastric tube appears looped within the inferior thorax, likely within the known hiatal hernia. IMPRESSION: Pulmonary hypoinflation. Nasogastric tube looped within the inferior thorax, likely within known large hiatal hernia. Right paratracheal soft tissue opacity, not well characterized, possibly artifactual and related to rotation. This would be better assessed with a standard two view chest radiograph or CT examination when the patient is clinically able to do so. Electronically Signed   By: Fidela Salisbury M.D.   On: 12/04/2020 21:41   DG Knee Right Port  Result Date: 12/07/2020 CLINICAL DATA:  Surgical manipulation of right knee joint EXAM: PORTABLE RIGHT KNEE - 1-2 VIEW COMPARISON:  12/06/2020 FINDINGS: Frontal and cross-table lateral views of the right knee demonstrate reduction of prior dislocation. The 3 component right knee arthroplasty demonstrates anatomic alignment. No evidence of fracture. Small joint effusion. IMPRESSION: 1. Reduction of prior right knee arthroplasty dislocation, now with anatomic alignment. 2. Small joint effusion. Electronically  Signed   By: Randa Ngo M.D.   On: 12/07/2020 17:52     The results of significant diagnostics from this hospitalization (including imaging, microbiology, ancillary and laboratory) are listed below for reference.     Microbiology: Recent Results (from the past 240 hour(s))  Resp Panel by RT-PCR (Flu A&B, Covid) Nasopharyngeal Swab     Status: None   Collection Time: 12/02/20  4:39 PM   Specimen: Nasopharyngeal Swab; Nasopharyngeal(NP) swabs in vial transport medium  Result Value Ref Range Status   SARS Coronavirus 2 by RT PCR NEGATIVE NEGATIVE Final    Comment: (NOTE) SARS-CoV-2 target nucleic acids are NOT DETECTED.  The SARS-CoV-2 RNA is generally detectable in upper respiratory specimens during the acute phase of infection. The lowest concentration of SARS-CoV-2 viral copies this assay can detect is 138 copies/mL. A negative result does not preclude SARS-Cov-2 infection and should not be used as the sole basis for treatment or other patient management decisions. A negative result may occur with  improper specimen collection/handling, submission of specimen other than nasopharyngeal swab, presence of viral mutation(s) within the areas targeted by this assay, and inadequate number of viral copies(<138 copies/mL). A negative result must be combined with clinical observations, patient history, and epidemiological information. The expected result is Negative.  Fact Sheet for Patients:  EntrepreneurPulse.com.au  Fact Sheet for Healthcare Providers:  IncredibleEmployment.be  This test is no t yet approved or cleared by the [place] FDA and  has been authorized for detection and/or diagnosis of SARS-CoV-2 by FDA under an Emergency Use Authorization (EUA). This EUA will remain  in effect (meaning this test can be used) for the duration of the COVID-19 declaration under Section 564(b)(1) of the Act, 21 U.S.C.section 360bbb-3(b)(1), unless  the authorization is terminated  or revoked sooner.       Influenza A by PCR NEGATIVE NEGATIVE Final   Influenza B by PCR NEGATIVE NEGATIVE Final  Comment: (NOTE) The Xpert Xpress SARS-CoV-2/FLU/RSV plus assay is intended as an aid in the diagnosis of influenza from Nasopharyngeal swab specimens and should not be used as a sole basis for treatment. Nasal washings and aspirates are unacceptable for Xpert Xpress SARS-CoV-2/FLU/RSV testing.  Fact Sheet for Patients: EntrepreneurPulse.com.au  Fact Sheet for Healthcare Providers: IncredibleEmployment.be  This test is not yet approved or cleared by the [place] FDA and has been authorized for detection and/or diagnosis of SARS-CoV-2 by FDA under an Emergency Use Authorization (EUA). This EUA will remain in effect (meaning this test can be used) for the duration of the COVID-19 declaration under Section 564(b)(1) of the Act, 21 U.S.C. section 360bbb-3(b)(1), unless the authorization is terminated or revoked.  Performed at [facility], [address] [place], [place] [address]   MRSA Next Gen by PCR, Nasal     Status: None   Collection Time: 12/04/20  9:05 PM   Specimen: Nasal Mucosa; Nasal Swab  Result Value Ref Range Status   MRSA by PCR Next Gen NOT DETECTED NOT DETECTED Final    Comment: (NOTE) The GeneXpert MRSA Assay (FDA approved for NASAL specimens only), is one component of a comprehensive MRSA colonization surveillance program. It is not intended to diagnose MRSA infection nor to guide or monitor treatment for MRSA infections. Test performance is not FDA approved in patients less than 27 years old. Performed at [facility], [address] [place],  [address]      Labs: BNP (last 3 results) No results for input(s): BNP in the last 8760 hours. Basic Metabolic Panel: Recent Labs  Lab 12/04/20 0535 12/05/20 0636 12/06/20 0430 12/07/20 0346  12/08/20 0419 12/09/20 0504  NA 134* 138 141 141 136  --   K 3.4* 3.4* 3.4* 3.4* 3.6  --   CL 96* 100 108 108 106  --   CO2 25 24 24  20* 20*  --   GLUCOSE 128* 100* 105* 93 119*  --   BUN 48* 55* 35* 27* 18  --   CREATININE 1.31* 0.99 0.55 0.58 0.55 0.44  CALCIUM 8.6* 9.1 8.8* 8.9 8.8*  --   MG  --  2.2  --  1.8  --   --   PHOS  --  3.5  --   --   --   --    Liver Function Tests: No results for input(s): AST, ALT, ALKPHOS, BILITOT, PROT, ALBUMIN in the last 168 hours. No results for input(s): LIPASE, AMYLASE in the last 168 hours. No results for input(s): AMMONIA in the last 168 hours. CBC: Recent Labs  Lab 12/04/20 0535 12/05/20 0636 12/06/20 0430 12/07/20 0346 12/08/20 0419  WBC 11.6* 9.8 9.8 10.2 9.7  HGB 10.4* 11.6* 11.2* 11.0* 12.5  HCT 29.8* 35.3* 33.6* 32.7* 38.1  MCV 87.4 88.3 90.8 88.9 88.0  PLT 184 201 219 262 248   Cardiac Enzymes: No results for input(s): CKTOTAL, CKMB, CKMBINDEX, TROPONINI in the last 168 hours. BNP: Invalid input(s): POCBNP CBG: Recent Labs  Lab 12/04/20 1204 12/04/20 2112 12/04/20 2337 12/05/20 0804 12/07/20 1544  GLUCAP 99 87 86 103* 106*   D-Dimer No results for input(s): DDIMER in the last 72 hours. Hgb A1c No results for input(s): HGBA1C in the last 72 hours. Lipid Profile No results for input(s): CHOL, HDL, LDLCALC, TRIG, CHOLHDL, LDLDIRECT in the last 72 hours. Thyroid function studies No results for input(s): TSH, T4TOTAL, T3FREE, THYROIDAB in the last 72 hours.  Invalid input(s): FREET3  Anemia work up No results for input(s): VITAMINB12, FOLATE, FERRITIN, TIBC, IRON, RETICCTPCT in the last 72 hours. Urinalysis    Component Value Date/Time   COLORURINE AMBER BIOCHEMICALS MAY BE AFFECTED BY COLOR (A) 06/25/2008 1242   APPEARANCEUR Cloudy (A) 08/31/2020 1039   LABSPEC 1.023 06/25/2008 1242   PHURINE 6.0 06/25/2008 1242   GLUCOSEU Negative 08/31/2020 1039   HGBUR NEGATIVE 06/25/2008 1242   BILIRUBINUR Negative  08/31/2020 1039   KETONESUR >80 (A) 06/25/2008 1242   PROTEINUR 1+ (A) 08/31/2020 1039   PROTEINUR NEGATIVE 06/25/2008 1242   UROBILINOGEN 0.2 06/25/2008 1242   NITRITE Positive (A) 08/31/2020 1039   NITRITE NEGATIVE 06/25/2008 1242   LEUKOCYTESUR 2+ (A) 08/31/2020 1039   Sepsis Labs Invalid input(s): PROCALCITONIN,  WBC,  LACTICIDVEN Microbiology Recent Results (from the past 240 hour(s))  Resp Panel by RT-PCR (Flu A&B, Covid) Nasopharyngeal Swab     Status: None   Collection Time: 12/02/20  4:39 PM   Specimen: Nasopharyngeal Swab; Nasopharyngeal(NP) swabs in vial transport medium  Result Value Ref Range Status   SARS Coronavirus 2 by RT PCR NEGATIVE NEGATIVE Final    Comment: (NOTE) SARS-CoV-2 target nucleic acids are NOT DETECTED.  The SARS-CoV-2 RNA is generally detectable in upper respiratory specimens during the acute phase of infection. The lowest concentration of SARS-CoV-2 viral copies this assay can detect is 138 copies/mL. A negative result does not preclude SARS-Cov-2 infection and should not be used as the sole basis for treatment or other patient management decisions. A negative result may occur with  improper specimen collection/handling, submission of specimen other than nasopharyngeal swab, presence of viral mutation(s) within the areas targeted by this assay, and inadequate number of viral copies(<138 copies/mL). A negative result must be combined with clinical observations, patient history, and epidemiological information. The expected result is Negative.  Fact Sheet for Patients:  EntrepreneurPulse.com.au  Fact Sheet for Healthcare Providers:  IncredibleEmployment.be  This test is no t yet approved or cleared by the [place] FDA and  has been authorized for detection and/or diagnosis of SARS-CoV-2 by FDA under an Emergency Use Authorization (EUA). This EUA will remain  in effect (meaning this test can be used) for  the duration of the COVID-19 declaration under Section 564(b)(1) of the Act, 21 U.S.C.section 360bbb-3(b)(1), unless the authorization is terminated  or revoked sooner.       Influenza A by PCR NEGATIVE NEGATIVE Final   Influenza B by PCR NEGATIVE NEGATIVE Final    Comment: (NOTE) The Xpert Xpress SARS-CoV-2/FLU/RSV plus assay is intended as an aid in the diagnosis of influenza from Nasopharyngeal swab specimens and should not be used as a sole basis for treatment. Nasal washings and aspirates are unacceptable for Xpert Xpress SARS-CoV-2/FLU/RSV testing.  Fact Sheet for Patients: EntrepreneurPulse.com.au  Fact Sheet for Healthcare Providers: IncredibleEmployment.be  This test is not yet approved or cleared by the [place] FDA and has been authorized for detection and/or diagnosis of SARS-CoV-2 by FDA under an Emergency Use Authorization (EUA). This EUA will remain in effect (meaning this test can be used) for the duration of the COVID-19 declaration under Section 564(b)(1) of the Act, 21 U.S.C. section 360bbb-3(b)(1), unless the authorization is terminated or revoked.  Performed at [facility], [address] [place], [place] [address]   MRSA Next Gen by PCR, Nasal     Status: None   Collection Time: 12/04/20  9:05 PM   Specimen: Nasal Mucosa; Nasal Swab  Result Value Ref Range Status  MRSA by PCR Next Gen NOT DETECTED NOT DETECTED Final    Comment: (NOTE) The GeneXpert MRSA Assay (FDA approved for NASAL specimens only), is one component of a comprehensive MRSA colonization surveillance program. It is not intended to diagnose MRSA infection nor to guide or monitor treatment for MRSA infections. Test performance is not FDA approved in patients less than 72 years old. Performed at [facility], [address] [place], [place] [address]      Time coordinating discharge in minutes: 65  SIGNED:   Debbe Odea, MD  [facility] 12/10/2020, 10:28 AM

## 2020-12-10 NOTE — Progress Notes (Signed)
Daily Progress Note   Patient Name: [name]       Date: 12/10/2020 DOB: June 10, 1940  Age: 80 y.o. MRN#: [id number] Attending Physician: Debbe Odea, MD Primary Care Physician: Jon Billings, NP Admit Date: 12/02/2020  Reason for Consultation/Follow-up: Establishing goals of care  Subjective: Message received that daughter wanted to speak with me. She is at bedside. Patient is resting in bed with eyes closed. Daughter asks questions regarding status, disposition, and discusses concerns. TOC in to speak with daughter and I. Questions answered. Recommend outpatient palliative, patient and family need time for outcomes.   Length of Stay: 8  Current Medications: Scheduled Meds:   amLODipine  10 mg Oral QHS   aspirin  81 mg Oral Daily   carvedilol  6.25 mg Oral BID WC   docusate sodium  100 mg Oral BID   enoxaparin (LOVENOX) injection  40 mg Subcutaneous Q24H   feeding supplement  237 mL Oral BID BM   lisinopril  40 mg Oral Daily   mouth rinse  15 mL Mouth Rinse BID   rosuvastatin  10 mg Oral Daily   sertraline  25 mg Oral Daily    Continuous Infusions:  sodium chloride Stopped (12/09/20 1357)    PRN Meds: sodium chloride, acetaminophen, acetaminophen, dextrose, hydrALAZINE, HYDROcodone-acetaminophen, metoCLOPramide **OR** metoCLOPramide (REGLAN) injection, ondansetron **OR** ondansetron (ZOFRAN) IV, traMADol  Physical Exam Constitutional:      Comments: Eyes closed.   Pulmonary:     Effort: Pulmonary effort is normal.            Vital Signs: BP (!) 165/60 (BP Location: Right Arm)   Pulse 64   Temp (!) 96.4 F (35.8 C) (Oral)   Resp 16   Ht 5\' 3"  (1.6 m)   Wt 86 kg Comment: bed weight  SpO2 96%   BMI 33.59 kg/m  SpO2: SpO2: 96 % O2 Device: O2 Device:  Room Air O2 Flow Rate: O2 Flow Rate (L/min): 3 L/min  Intake/output summary:  Intake/Output Summary (Last 24 hours) at 12/10/2020 1333 Last data filed at 12/10/2020 1009 Gross per 24 hour  Intake 303.09 ml  Output 850 ml  Net -546.91 ml   LBM: Last BM Date: 12/10/20 Baseline Weight: Weight: 82.5 kg Most recent weight: Weight: 86 kg (bed weight)  Patient Active Problem List   Diagnosis Date Noted   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[facility]) 12/10/2020   Knee dislocation, right, initial encounter 12/10/2020   HLD (hyperlipidemia) 12/05/2020   Depression with anxiety 12/05/2020   Coma ([place]) 12/05/2020   Acute encephalopathy    Strangulated inguinal hernia 12/02/2020   Anxiety 11/25/2020   Aortic valve stenosis 09/09/2020   Prediabetes 05/17/2020   Hypertension 05/10/2020   Age-related osteoporosis without current pathological fracture 01/03/2016   Pure hypercholesterolemia 01/03/2016   Elevated TSH 01/03/2016   Primary osteoarthritis of both knees 12/06/2015   Vitamin D deficiency 06/16/2014   Personal history of other malignant neoplasm of skin 02/19/2014   Prolapse of vaginal vault 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 08/14/2013   Patient on low glycemic diet 08/02/2012   Knee pain 08/02/2012   Gynecological examination 06/20/2011   Neuropathy 06/20/2011   Uterine prolapse 06/20/2011   Anemia 06/18/2009   HYPOKALEMIA, MILD 06/25/2008   INTESTINAL OBSTRUCTION, HX OF 12/11/2007   BLEEDING, POSTMENOPAUSAL 10/23/2006   PERSONALITY DISORDER 10/22/2006   TREMOR, ESSENTIAL 10/22/2006   TRANSAMINASES, SERUM, ELEVATED 10/22/2006    Palliative Care Assessment & Plan     Recommendations/Plan: Recommend outpatient palliative, patient and family needs time for outcomes.     Code Status:    Code Status Orders  (From admission, onward)           Start     Ordered   12/05/20 1037  Do not attempt resuscitation (DNR)  Continuous       Question Answer Comment  In the event of  cardiac or respiratory ARREST Do not call a "code blue"   In the event of cardiac or respiratory ARREST Do not perform Intubation, CPR, defibrillation or ACLS   In the event of cardiac or respiratory ARREST Use medication by any route, position, wound care, and other measures to relive pain and suffering. May use oxygen, suction and manual treatment of airway obstruction as needed for comfort.   Comments NO ESCALATION OF CARE      12/05/20 1037           Code Status History     Date Active Date Inactive Code Status Order ID Comments User Context   12/04/2020 2139 12/05/2020 1037 DNR [id number]  Renato Battles, NP Inpatient   12/03/2020 0322 12/04/2020 2139 Full Code [id number]  Benjamine Sprague, DO Inpatient       Care plan was discussed with [facility]  Thank you for allowing the Palliative Medicine Team to assist in the care of this patient.       Total Time 15 min Prolonged Time Billed  no       Greater than 50%  of this time was spent counseling and coordinating care related to the above assessment and plan.  Asencion Gowda, NP  Please contact Palliative Medicine Team phone at [phone] for questions and concerns.

## 2020-12-10 NOTE — Progress Notes (Signed)
Physical Therapy Treatment Patient Details Name: [name] MRN: [id number] DOB: 04-Aug-1940 Today's Date: 12/10/2020   History of Present Illness Pt admitted for strangulated inguinal hernia with complaints of N/V and abdominal pain. History includes anemia, HLD, and OA. She is now s/p R femoral hernia repair with mesh. Pt initially evaluated by PT on 9/23. On 9/24 pt had a code stroke called and imaging revealed B watershead infarcts R>L per neurology. 9/26 pt found to have R knee posteriorly subluxed with unclear etiology. Pt underwent closed reduction on 9/27 and is to remain WBAT with Knee Immobilizer on at all times.    PT Comments    Pt received laying supine in bed with daughter present and requesting to get out of bed. Pt required modA for supine to sitting EOB. Attempted standing x 2 with R UE handheld assistance + another therapist for L knee block and required maxA +2 for partial standing. Completed squat pivot towards R to recliner chair with totalA + 1. Pt demonstrating improved L cervical rotation to auditory stimuli. Pt will continue to benefit from skilled PT services to improve mobility and reduce caregiver burden.    Recommendations for follow up therapy are one component of a multi-disciplinary discharge planning process, led by the attending physician.  Recommendations may be updated based on patient status, additional functional criteria and insurance authorization.  Follow Up Recommendations  SNF     Equipment Recommendations  Other (comment)    Recommendations for Other Services       Precautions / Restrictions Precautions Precautions: Fall Required Braces or Orthoses: Knee Immobilizer - Right Knee Immobilizer - Right: On at all times Restrictions Weight Bearing Restrictions: Yes RLE Weight Bearing: Weight bearing as tolerated     Mobility  Bed Mobility Overal bed mobility: Needs Assistance Bed Mobility: Sit to Supine     Supine to sit: Mod  assist;HOB elevated     General bed mobility comments: ModA for movement of B LEs off EOB and with trunk to upright position    Transfers Overall transfer level: Needs assistance Equipment used: 1 person hand held assist Transfers: Sit to/from [employer] Sit to Stand: +2 physical assistance;Total assist   Squat pivot transfers: Total assist     General transfer comment: R UE hand held assistance with + 2 assist for management of L UE and L knee block for sit to stand training x 2. TotalA x 1 with usage of chuck pad for incremental squat pivot transfer to recliner chair.  Ambulation/Gait             General Gait Details: Deferred at this time   Stairs             Wheelchair Mobility    Modified Rankin (Stroke Patients Only)       Balance Overall balance assessment: Needs assistance Sitting-balance support: Feet supported;Single extremity supported Sitting balance-Leahy Scale: Fair Sitting balance - Comments: Cues to improve posture in sitting with back unsupported. Requires intermittent minA for dynamic seated balance        Cognition Arousal/Alertness: Awake/alert Behavior During Therapy: Flat affect Overall Cognitive Status: Impaired/Different from baseline                   Orientation Level: Disoriented to;Time             General Comments: Improved orientation to situation, able to identify that she had a stroke      Exercises      General  Comments        Pertinent Vitals/Pain Pain Assessment: No/denies pain    Home Living                      Prior Function            PT Goals (current goals can now be found in the care plan section) Acute Rehab PT Goals Patient Stated Goal: to get stronger PT Goal Formulation: With patient/family Time For Goal Achievement: 12/17/20 Potential to Achieve Goals: Poor Progress towards PT goals: Progressing toward goals    Frequency    7X/week      PT  Plan Current plan remains appropriate    Co-evaluation              AM-PAC PT "6 Clicks" Mobility   Outcome Measure  Help needed turning from your back to your side while in a flat bed without using bedrails?: Total Help needed moving from lying on your back to sitting on the side of a flat bed without using bedrails?: Total Help needed moving to and from a bed to a chair (including a wheelchair)?: Total Help needed standing up from a chair using your arms (e.g., wheelchair or bedside chair)?: Total Help needed to walk in hospital room?: Total Help needed climbing 3-5 steps with a railing? : Total 6 Click Score: 6    End of Session Equipment Utilized During Treatment: Gait belt Activity Tolerance: Patient tolerated treatment well Patient left: in chair;with call bell/phone within reach;with chair alarm set;with family/visitor present Nurse Communication: Mobility status PT Visit Diagnosis: Muscle weakness (generalized) (M62.81);Hemiplegia and hemiparesis;Other abnormalities of gait and mobility (R26.89) Hemiplegia - Right/Left: Left Hemiplegia - dominant/non-dominant: Non-dominant Hemiplegia - caused by: Cerebral infarction     Time: 3646-8032 PT Time Calculation (min) (ACUTE ONLY): 23 min  Charges:  $Therapeutic Activity: 8-22 mins $Neuromuscular Re-education: 8-22 mins                     Andrey Campanile, SPT    Andrey Campanile 12/10/2020, 1:18 PM

## 2020-12-10 NOTE — TOC Progression Note (Signed)
Transition of Care [facility]) - Progression Note    Patient Details  Name: Janeese Mcgloin MRN: [id number] Date of Birth: [birth year]  Transition of Care [facility]) CM/SW Contact  Shelbie Hutching, RN Phone Number: 12/10/2020, 9:04 AM  Clinical Narrative:    [facility] and [facility] both reviewing to see if they can offer a bed.  Insurance auth started pending facility and rapid COVID ordered.     Expected Discharge Plan: [place] Barriers to Discharge: Continued Medical Work up  Expected Discharge Plan and Services Expected Discharge Plan: [place] Choice: [place] arrangements for the past 2 months: Single Family Home Expected Discharge Date: 12/10/20                                     Social Determinants of Health (SDOH) Interventions    Readmission Risk Interventions No flowsheet data found.

## 2020-12-11 DIAGNOSIS — I6389 Other cerebral infarction: Secondary | ICD-10-CM | POA: Diagnosis not present

## 2020-12-11 DIAGNOSIS — K4030 Unilateral inguinal hernia, with obstruction, without gangrene, not specified as recurrent: Secondary | ICD-10-CM | POA: Diagnosis not present

## 2020-12-11 DIAGNOSIS — F418 Other specified anxiety disorders: Secondary | ICD-10-CM | POA: Diagnosis not present

## 2020-12-11 DIAGNOSIS — T148XXA Other injury of unspecified body region, initial encounter: Secondary | ICD-10-CM | POA: Diagnosis not present

## 2020-12-11 LAB — BASIC METABOLIC PANEL
Anion gap: 11 (ref 5–15)
BUN: 13 mg/dL (ref 8–23)
CO2: 21 mmol/L — ABNORMAL LOW (ref 22–32)
Calcium: 8.9 mg/dL (ref 8.9–10.3)
Chloride: 99 mmol/L (ref 98–111)
Creatinine, Ser: 0.5 mg/dL (ref 0.44–1.00)
GFR, Estimated: >60 mL/min (ref >60)
Glucose, Bld: 103 mg/dL — ABNORMAL HIGH (ref 70–99)
Potassium: 3.8 mmol/L (ref 3.5–5.1)
Sodium: 131 mmol/L — ABNORMAL LOW (ref 135–145)

## 2020-12-11 LAB — CBC
HCT: 38.1 % (ref 36.0–46.0)
Hemoglobin: 12.9 g/dL (ref 12.0–15.0)
MCH: 29 pg (ref 26.0–34.0)
MCHC: 33.9 g/dL (ref 30.0–36.0)
MCV: 85.6 fL (ref 80.0–100.0)
Platelets: 340 10*3/uL (ref 150–400)
RBC: 4.45 MIL/uL (ref 3.87–5.11)
RDW: 14.3 % (ref 11.5–15.5)
WBC: 10.6 10*3/uL — ABNORMAL HIGH (ref 4.0–10.5)
nRBC: 0 % (ref 0.0–0.2)

## 2020-12-11 NOTE — TOC Progression Note (Signed)
Transition of Care [facility]) - Progression Note    Patient Details  Name: Nairi Oswald MRN: [id number] Date of Birth: 11/19/1940  Transition of Care [facility]) CM/SW [place], LCSW Phone Number: 12/11/2020, 3:58 PM  Clinical Narrative:   Called patient's daughters with update per their request. Informed them that per [facility] notes it looks like patient will be going to [place] on Monday. Explained nonemergent EMS transport, daughter would like to be here when EMS pick her up.  They asked for a referral for Outpatient Palliative care. Referral sent to [facility].  They asked about the COVID booster. Asked MD.  [place] portal to confirm SNF is approved through 12-14-20.   Expected Discharge Plan: [place] Barriers to Discharge: Continued Medical Work up  Expected Discharge Plan and Services Expected Discharge Plan: [place] Choice: [place] arrangements for the past 2 months: Single Family Home Expected Discharge Date: 12/10/20                                     Social Determinants of Health (SDOH) Interventions    Readmission Risk Interventions No flowsheet data found.

## 2020-12-11 NOTE — Progress Notes (Addendum)
Subjective: 4 Days Post-Op Procedure(s) (LRB): CLOSED MANIPULATION KNEE (Right) The patient's right knee subluxed posteriorly again this morning when her knee immobilizer was opened up.  I discussed this with Dr. Harlow Mares who explained the situation to me.  I was able to re-manipulate the knee back into extension at the bedside and place her back in a new knee immobilizer in full extension.  [name] and son-in-law were present and understand the uncertainty of this condition.  Dr. Harlow Mares will be in on Monday to recheck the patient and discuss further treatment.  Because of her recent stroke and general debilitation any major surgery would be difficult.  Patient reports pain as moderate.  Objective:   VITALS:   Vitals:   12/11/20 0844 12/11/20 1127  BP: (!) 157/59 (!) 158/57  Pulse: 71 63  Resp: 18 18  Temp: 98.1 F (36.7 C) 98.2 F (36.8 C)  SpO2: 99% 97%    The right knee was posterior subluxed and first observation.  Neurovascular status was intact.  The skin anteriorly and wounds were intact.  I was able to manipulate the posterior subluxation into extension and apply a new knee immobilizer.  Neurovascular status appears intact.  LABS No results for input(s): HGB, HCT, WBC, PLT in the last 72 hours.  Recent Labs    12/09/20 0504  CREATININE 0.44    No results for input(s): LABPT, INR in the last 72 hours.   Assessment/Plan: 4 Days Post-Op Procedure(s) (LRB): CLOSED MANIPULATION KNEE (Right)   The knee immobilizer visor must be left in place at all times. A  muscle relaxer like baclofen might be of help. We will continue to monitor. I have discussed this with Dr. Wynelle Cleveland and Dr. Harlow Mares.

## 2020-12-11 NOTE — Progress Notes (Signed)
PT Cancellation Note  Patient Details Name: [name] MRN: [id number] DOB: Mar 02, 1941   Cancelled Treatment:     PT attempt. Pt receiving bed level bath upon arriving. RN tech notified knee immobilizer was soiled from BM. Author assisted RN staff with replacement however when immobilizer removed, R knee presents with visible posterior subluxation. Knee was manipulated several days prior. Dr. Sabra Heck with orthopedics notified along with Dr Wynelle Cleveland. Will hold PT today.   [name] 12/11/2020, 8:36 AM

## 2020-12-11 NOTE — Progress Notes (Signed)
This morning while patient had a bed bath noted Stage I with open blisters to bilateral gluteal folds.  R gluteal fold Stage I: 9.5cmx5cm.  L gluteal fold stage I: 7cmx5cm.  Foam dressing changed and will reposition patient every 2 hrs and as needed.   Also noted R lateral heel redden, but blanchable.  Foam dressing applied to bilateral heels.  Will check with MD if can order Prevalon boots.  Notified Dr. Wynelle Cleveland of the above.  Order for [employer] received.

## 2020-12-11 NOTE — Progress Notes (Signed)
PROGRESS NOTE    [name]   WLN:[id number]  DOB: 1940-11-20  DOA: 12/02/2020 PCP: Jon Billings, NP   Brief Narrative:  [name] is an 80 y.o. female with a past medical history of hypertension, hyperlipidemia, prediabetes, depression with anxiety, schizoaffective disorder, anemia, skin cancer, was admitted by the surgical team for strangulated inguinal hernia. The patient underwent open right femoral hernia repair with mesh and small bowel resection on 9/22. 9/24, rapid response was called the patient was found unresponsive.  Subsequently, code stroke was called.  On exam the patient was noted to have sonorous respirations while her eyes were wide open and did not respond to any external stimuli.  She had extensor posturing.  CT of the head showed a questionable acute cortical infarct within the right parietal lobe. MRI of the brain revealed bilateral MCA/ACA territory watershed infarcts.  It was noted that the patient's blood pressures had been low earlier in the morning.  During hospitalization, the patient was discovered to have a dislocation of the right knee.  An orthopedics consult was requested on 9/26.  And underwent reduction of the knee on 9/27 under IV anesthesia.  Subjective: Right knee has become dislocated today. It was notice when PT came to evaluate her. She has no pain. Per RN, she has had many loose stools and is incontinent.     Assessment & Plan:   Principal Problem:   Strangulated inguinal hernia -She underwent right femoral hernia repair with mesh and small bowel resection on 9/22 - NG tube removed 9/27 - Zosyn was discontinued on 9/30 -diet has been downgraded to thin liquids-she is eating and drinking quite well now  Active Problems: Diarrhea - check WBC count today- also check BUN/ Cr. Sodium and potassium-  -abdomen is benign on exam- she had not been drinking any ensure. She receives Colace but this is likely not the cause of  her loose stools- will follow for now  Bilateral cerebral hemispheric watershed infarcts related to postop hypotension -Patient was obtunded immediately after above-mentioned infarcts - She is beginning to awaken now - Goal blood pressures initially were between 130-180 - As BP was not 196/74 on 9/28, I increased her amlodipine from 5 to 10 mg -Her last blood pressure was 152/55 - Continue to follow BPs today  Right knee dislocation status post reduction on 9/27 - unfortunately, knee has dislocated again- Ortho has been consulted    Hypertension -See above in regards to blood pressure medications    HLD (hyperlipidemia) -Continue Crestor    Depression with anxiety -Continue sertraline    Time spent in minutes: 35 DVT prophylaxis: SCDs Start: 12/07/20 1702 enoxaparin (LOVENOX) injection 40 mg Start: 12/03/20 0800 Code Status: DNR Family Communication: Plan discussed with daughter, Romelle Starcher- Her daughter was at bedside and I have discussed the plan with her and answered all of her questions. Level of Care: Level of care: Med-Surg Disposition Plan:  Status is: Inpatient  Remains inpatient appropriate because:Inpatient level of care appropriate due to severity of illness  Dispo: The patient is from: Home              Anticipated d/c is to: SNF              Patient currently is not medically stable to d/c.   Difficult to place patient No      Consultants:  General surgery Neurology Orthopedic surgery  Procedures:  open right femoral hernia repair with mesh and small bowel resection  Right knee reduction Antimicrobials:  Anti-infectives (From admission, onward)    Start     Dose/Rate Route Frequency Ordered Stop   12/09/20 1400  piperacillin-tazobactam (ZOSYN) IVPB 3.375 g        3.375 g 12.5 mL/hr over 240 Minutes Intravenous Every 8 hours 12/09/20 0905 12/10/20 0959   12/02/20 2200  piperacillin-tazobactam (ZOSYN) IVPB 3.375 g  Status:  Discontinued        3.375  g 12.5 mL/hr over 240 Minutes Intravenous Every 8 hours 12/02/20 2138 12/09/20 0905   12/02/20 1827  ceFAZolin (ANCEF) IVPB 2g/100 mL premix  Status:  Discontinued       Note to Pharmacy: On call to OR   2 g 200 mL/hr over 30 Minutes Intravenous 60 min pre-op 12/02/20 1819 12/02/20 2212        Objective: Vitals:   12/11/20 0020 12/11/20 0057 12/11/20 0443 12/11/20 0844  BP: (!) 183/64 (!) 162/56 (!) 177/58 (!) 157/59  Pulse: 68 66 70 71  Resp: 18  16 18   Temp: 98.5 F (36.9 C)  98.2 F (36.8 C) 98.1 F (36.7 C)  TempSrc: Oral  Oral Oral  SpO2: 97% 98% 98% 99%  Weight:      Height:        Intake/Output Summary (Last 24 hours) at 12/11/2020 1037 Last data filed at 12/11/2020 0650 Gross per 24 hour  Intake 480 ml  Output 500 ml  Net -20 ml    Filed Weights   12/04/20 0756 12/04/20 2111 12/08/20 1101  Weight: 82.5 kg 76.7 kg 86 kg    Examination: General exam: Appears comfortable  HEENT: PERRLA, oral mucosa moist, no sclera icterus or thrush Respiratory system: Clear to auscultation. Respiratory effort normal. Cardiovascular system: S1 & S2 heard, regular rate and rhythm Gastrointestinal system: Abdomen soft, non-tender, nondistended. Normal bowel sounds   Central nervous system: Alert and oriented.  Very limited movement of her left arm and leg Extremities: No cyanosis, clubbing-left forearm and hand noted to be edematous today - Right leg is in an immobilizer Skin: No rashes or ulcers Psychiatry:  Mood & affect appropriate.      Data Reviewed: I have personally reviewed following labs and imaging studies  CBC: Recent Labs  Lab 12/05/20 0636 12/06/20 0430 12/07/20 0346 12/08/20 0419  WBC 9.8 9.8 10.2 9.7  HGB 11.6* 11.2* 11.0* 12.5  HCT 35.3* 33.6* 32.7* 38.1  MCV 88.3 90.8 88.9 88.0  PLT 201 219 262 175    Basic Metabolic Panel: Recent Labs  Lab 12/05/20 0636 12/06/20 0430 12/07/20 0346 12/08/20 0419 12/09/20 0504  NA 138 141 141 136  --   K  3.4* 3.4* 3.4* 3.6  --   CL 100 108 108 106  --   CO2 24 24 20* 20*  --   GLUCOSE 100* 105* 93 119*  --   BUN 55* 35* 27* 18  --   CREATININE 0.99 0.55 0.58 0.55 0.44  CALCIUM 9.1 8.8* 8.9 8.8*  --   MG 2.2  --  1.8  --   --   PHOS 3.5  --   --   --   --     GFR: Estimated Creatinine Clearance: 57.3 mL/min (by C-G formula based on SCr of 0.44 mg/dL). Liver Function Tests: No results for input(s): AST, ALT, ALKPHOS, BILITOT, PROT, ALBUMIN in the last 168 hours.  No results for input(s): LIPASE, AMYLASE in the last 168 hours.  No results for input(s): AMMONIA in the last  168 hours. Coagulation Profile: No results for input(s): INR, PROTIME in the last 168 hours. Cardiac Enzymes: No results for input(s): CKTOTAL, CKMB, CKMBINDEX, TROPONINI in the last 168 hours. BNP (last 3 results) No results for input(s): PROBNP in the last 8760 hours. HbA1C: No results for input(s): HGBA1C in the last 72 hours. CBG: Recent Labs  Lab 12/04/20 1204 12/04/20 2112 12/04/20 2337 12/05/20 0804 12/07/20 1544  GLUCAP 99 87 86 103* 106*    Lipid Profile: No results for input(s): CHOL, HDL, LDLCALC, TRIG, CHOLHDL, LDLDIRECT in the last 72 hours. Thyroid Function Tests: No results for input(s): TSH, T4TOTAL, FREET4, T3FREE, THYROIDAB in the last 72 hours. Anemia Panel: No results for input(s): VITAMINB12, FOLATE, FERRITIN, TIBC, IRON, RETICCTPCT in the last 72 hours. Urine analysis:    Component Value Date/Time   COLORURINE AMBER BIOCHEMICALS MAY BE AFFECTED BY COLOR (A) 06/25/2008 1242   APPEARANCEUR Cloudy (A) 08/31/2020 1039   LABSPEC 1.023 06/25/2008 1242   PHURINE 6.0 06/25/2008 1242   GLUCOSEU Negative 08/31/2020 1039   HGBUR NEGATIVE 06/25/2008 1242   BILIRUBINUR Negative 08/31/2020 1039   KETONESUR >80 (A) 06/25/2008 1242   PROTEINUR 1+ (A) 08/31/2020 1039   PROTEINUR NEGATIVE 06/25/2008 1242   UROBILINOGEN 0.2 06/25/2008 1242   NITRITE Positive (A) 08/31/2020 1039   NITRITE  NEGATIVE 06/25/2008 1242   LEUKOCYTESUR 2+ (A) 08/31/2020 1039   Sepsis Labs: @LABRCNTIP (procalcitonin:4,lacticidven:4) ) Recent Results (from the past 240 hour(s))  Resp Panel by RT-PCR (Flu A&B, Covid) Nasopharyngeal Swab     Status: None   Collection Time: 12/02/20  4:39 PM   Specimen: Nasopharyngeal Swab; Nasopharyngeal(NP) swabs in vial transport medium  Result Value Ref Range Status   SARS Coronavirus 2 by RT PCR NEGATIVE NEGATIVE Final    Comment: (NOTE) SARS-CoV-2 target nucleic acids are NOT DETECTED.  The SARS-CoV-2 RNA is generally detectable in upper respiratory specimens during the acute phase of infection. The lowest concentration of SARS-CoV-2 viral copies this assay can detect is 138 copies/mL. A negative result does not preclude SARS-Cov-2 infection and should not be used as the sole basis for treatment or other patient management decisions. A negative result may occur with  improper specimen collection/handling, submission of specimen other than nasopharyngeal swab, presence of viral mutation(s) within the areas targeted by this assay, and inadequate number of viral copies(<138 copies/mL). A negative result must be combined with clinical observations, patient history, and epidemiological information. The expected result is Negative.  Fact Sheet for Patients:  EntrepreneurPulse.com.au  Fact Sheet for Healthcare Providers:  IncredibleEmployment.be  This test is no t yet approved or cleared by the [place] FDA and  has been authorized for detection and/or diagnosis of SARS-CoV-2 by FDA under an Emergency Use Authorization (EUA). This EUA will remain  in effect (meaning this test can be used) for the duration of the COVID-19 declaration under Section 564(b)(1) of the Act, 21 U.S.C.section 360bbb-3(b)(1), unless the authorization is terminated  or revoked sooner.       Influenza A by PCR NEGATIVE NEGATIVE Final    Influenza B by PCR NEGATIVE NEGATIVE Final    Comment: (NOTE) The Xpert Xpress SARS-CoV-2/FLU/RSV plus assay is intended as an aid in the diagnosis of influenza from Nasopharyngeal swab specimens and should not be used as a sole basis for treatment. Nasal washings and aspirates are unacceptable for Xpert Xpress SARS-CoV-2/FLU/RSV testing.  Fact Sheet for Patients: EntrepreneurPulse.com.au  Fact Sheet for Healthcare Providers: IncredibleEmployment.be  This test is not yet approved or  cleared by the [place] and has been authorized for detection and/or diagnosis of SARS-CoV-2 by FDA under an Emergency Use Authorization (EUA). This EUA will remain in effect (meaning this test can be used) for the duration of the COVID-19 declaration under Section 564(b)(1) of the Act, 21 U.S.C. section 360bbb-3(b)(1), unless the authorization is terminated or revoked.  Performed at [facility], [address] [place], [place] [address]   MRSA Next Gen by PCR, Nasal     Status: None   Collection Time: 12/04/20  9:05 PM   Specimen: Nasal Mucosa; Nasal Swab  Result Value Ref Range Status   MRSA by PCR Next Gen NOT DETECTED NOT DETECTED Final    Comment: (NOTE) The GeneXpert MRSA Assay (FDA approved for NASAL specimens only), is one component of a comprehensive MRSA colonization surveillance program. It is not intended to diagnose MRSA infection nor to guide or monitor treatment for MRSA infections. Test performance is not FDA approved in patients less than 50 years old. Performed at [facility], [place], [place] [address]   SARS CORONAVIRUS 2 (TAT 6-24 HRS) Nasopharyngeal Nasopharyngeal Swab     Status: None   Collection Time: 12/10/20 10:58 AM   Specimen: Nasopharyngeal Swab  Result Value Ref Range Status   SARS Coronavirus 2 NEGATIVE NEGATIVE Final    Comment: (NOTE) SARS-CoV-2 target nucleic acids are NOT  DETECTED.  The SARS-CoV-2 RNA is generally detectable in upper and lower respiratory specimens during the acute phase of infection. Negative results do not preclude SARS-CoV-2 infection, do not rule out co-infections with other pathogens, and should not be used as the sole basis for treatment or other patient management decisions. Negative results must be combined with clinical observations, patient history, and epidemiological information. The expected result is Negative.  Fact Sheet for Patients: SugarRoll.be  Fact Sheet for Healthcare Providers: https://www.woods-mathews.com/  This test is not yet approved or cleared by the [place] FDA and  has been authorized for detection and/or diagnosis of SARS-CoV-2 by FDA under an Emergency Use Authorization (EUA). This EUA will remain  in effect (meaning this test can be used) for the duration of the COVID-19 declaration under Se ction 564(b)(1) of the Act, 21 U.S.C. section 360bbb-3(b)(1), unless the authorization is terminated or revoked sooner.  Performed at [facility], [place] [address] [place], [place] [address]          Radiology Studies: No results found.    Scheduled Meds:  amLODipine  10 mg Oral QHS   aspirin  81 mg Oral Daily   carvedilol  6.25 mg Oral BID WC   COVID-19 mRNA bivalent vaccine (Pfizer)  0.3 mL Intramuscular Once   enoxaparin (LOVENOX) injection  40 mg Subcutaneous Q24H   feeding supplement  237 mL Oral BID BM   lisinopril  40 mg Oral Daily   mouth rinse  15 mL Mouth Rinse BID   rosuvastatin  10 mg Oral Daily   sertraline  25 mg Oral Daily   Continuous Infusions:  sodium chloride Stopped (12/09/20 1357)     LOS: 9 days      Debbe Odea, MD [facility] Pager: [email or web address] 12/11/2020, 10:37 AM

## 2020-12-11 NOTE — TOC Progression Note (Signed)
Transition of Care [facility]) - Progression Note    Patient Details  Name: [name] MRN: [id number] Date of Birth: 10-29-1940  Transition of Care [facility]) CM/SW [place], RN Phone Number: 12/11/2020, 3:10 PM  Clinical Narrative:   Patient requires continued medical workup today   Expected Discharge Plan: Skilled Nursing Facility Barriers to Discharge: Continued Medical Work up  Expected Discharge Plan and Services Expected Discharge Plan: [place] Choice: [place] arrangements for the past 2 months: Single Family Home Expected Discharge Date: 12/10/20                                     Social Determinants of Health (SDOH) Interventions    Readmission Risk Interventions No flowsheet data found.

## 2020-12-12 ENCOUNTER — Inpatient Hospital Stay: Payer: Medicare PPO

## 2020-12-12 DIAGNOSIS — K4030 Unilateral inguinal hernia, with obstruction, without gangrene, not specified as recurrent: Secondary | ICD-10-CM | POA: Diagnosis not present

## 2020-12-12 DIAGNOSIS — F418 Other specified anxiety disorders: Secondary | ICD-10-CM | POA: Diagnosis not present

## 2020-12-12 DIAGNOSIS — I6389 Other cerebral infarction: Secondary | ICD-10-CM | POA: Diagnosis not present

## 2020-12-12 DIAGNOSIS — T148XXA Other injury of unspecified body region, initial encounter: Secondary | ICD-10-CM | POA: Diagnosis not present

## 2020-12-12 NOTE — Progress Notes (Addendum)
Subjective: 5 Days Post-Op Procedure(s) (LRB): CLOSED MANIPULATION KNEE (Right) The patient is alert and fairly oriented at this moment.  She apparently fell out of bed onto the left side earlier this morning.  She bruised the left side of her face.  She is not complaining of any increased pain in the right lower extremity and her knee immobilizer has remained in place.  There is no evidence of any trauma to the other extremities or spine.  Her 2 daughters are present today and we went over her carefully including opening the knee immobilizer to check the knee which appeared to be well extended.  Dr. Harlow Mares should be in tomorrow to discuss further treatment with them.  She apparently is scheduled to go to skilled nursing in [place] tomorrow.  Patient reports pain as mild.  Objective:   VITALS:   Vitals:   12/12/20 1205 12/12/20 1410  BP: (!) 150/56 (!) 148/49  Pulse: 69 72  Resp: 16   Temp: 98.9 F (37.2 C) (!) 97.4 F (36.3 C)  SpO2: 96% 100%    Neurologically intact The patient is alert and awake.  Head and neck range of motion is fairly good without pain.  She has slight swelling to the left side of the face without any noticeable bruising.  Mentation seems unchanged.  Upper extremities show good motion without any pain or bruising.  Lower extremities show good motion of the hips and the left knee and ankles.  Right knee remains in knee immobilizer which was opened.  Knee remains well extended and the skin is intact.  There is no bruising there is increased pain here locally.  LABS Recent Labs    12/11/20 1239  HGB 12.9  HCT 38.1  WBC 10.6*  PLT 340    Recent Labs    12/11/20 1239  NA 131*  K 3.8  BUN 13  CREATININE 0.50  GLUCOSE 103*    No results for input(s): LABPT, INR in the last 72 hours.   Assessment/Plan: 5 Days Post-Op Procedure(s) (LRB): CLOSED MANIPULATION KNEE (Right)   Discharge to SNF the patient is to remain in the knee immobilizer at all  times. X-rays of the face have been ordered by Dr. Wynelle Cleveland.  I do not see an indication for any other x-rays at this time. Harlow Mares will see her tomorrow for further discussion of treatment with the family.

## 2020-12-12 NOTE — Progress Notes (Signed)
Per Dr Wynelle Cleveland ok to dc tele monitoring

## 2020-12-12 NOTE — Progress Notes (Signed)
Spoke with daughters at bedside, the daughters are requesting that all 4 bed rails be up on the pts bed since she rolled out of bed, explained to them that this is against policy and is considered a restraint but if they request 4 side rails then I would document that they requested 4 rails regardless of policy and that we will put all rails up, also made them aware that pt now qualifies for the fall mat which has been put down in floor beside of bed, call light remains in reach and encouraged, bed alarm on, pt resting at this time with no complaints

## 2020-12-12 NOTE — TOC Progression Note (Addendum)
Transition of Care [facility]) - Progression Note    Patient Details  Name: Nastacia Raybuck MRN: [id number] Date of Birth: 1940/04/27  Transition of Care [facility]) CM/SW [place], LCSW Phone Number: 12/12/2020, 10:28 AM  Clinical Narrative:   Left VM for [facility] at [facility] to confirm plan for DC there tomorrow.  Provided this CSW's # as well as weekday RNCM's # for a return call.     Expected Discharge Plan: [place] Barriers to Discharge: Continued Medical Work up  Expected Discharge Plan and Services Expected Discharge Plan: [place] Choice: [place] arrangements for the past 2 months: Single Family Home Expected Discharge Date: 12/10/20                                     Social Determinants of Health (SDOH) Interventions    Readmission Risk Interventions No flowsheet data found.

## 2020-12-12 NOTE — Progress Notes (Signed)
   12/12/20 1415  What Happened  Was fall witnessed? No  Was patient injured? Unsure  Patient found on floor  Found by Staff-comment [facility] RN)  Stated prior activity other (comment) (States that she was trying to get in bed, and states that she didnt want to roll out of bed)  Follow Up  MD notified Dr Sabra Heck and Dr Wynelle Cleveland  Time MD notified [phone]  Family notified Yes - comment  Time family notified 1420  Additional tests Yes-comment (MD to order scan of head/face and will follow up with ortho MD)  Progress note created (see row info) Yes  Adult Fall Risk Assessment  Risk Factor Category (scoring not indicated) Fall has occurred during this admission (document High fall risk) (Fall at this time)  Patient Fall Risk Level High fall risk  Adult Fall Risk Interventions  Required Bundle Interventions *See Row Information* High fall risk - low, moderate, and high requirements implemented  Additional Interventions Use of appropriate toileting equipment (bedpan, BSC, etc.);Room near nurses station;Family Supervision  Screening for Fall Injury Risk (To be completed on HIGH fall risk patients) - Assessing Need for Floor Mats  Risk For Fall Injury- Criteria for Floor Mats Previous fall this admission (Fall at this time, mats placed)  Will Implement Floor Mats Yes  Pain Assessment  Pain Scale 0-10  Pain Score 6  Pain Type Acute pain  Pain Location Head (neck, MD aware of pain complaints)  Pain Orientation Right;Left  Pain Descriptors / Indicators Aching  Pain Onset Sudden  Patients Stated Pain Goal 0  Pain Intervention(s) Medication (See eMAR)  Multiple Pain Sites Yes  2nd Pain Site  Pain Location Neck  Neurological  Neuro (WDL) X  Level of Consciousness Alert  Orientation Level Oriented to person;Oriented to place;Disoriented to time;Disoriented to situation  Cognition Memory impairment;Poor safety awareness;Poor judgement;Poor attention/concentration  Speech Clear  [place]  Coma Scale  Eye Opening 4  Best Verbal Response (NON-intubated) 4  Best Motor Response 6  [place] Coma Scale Score 14  Musculoskeletal Details  Right Knee Ortho/Supportive Device

## 2020-12-12 NOTE — Progress Notes (Addendum)
Physical Therapy Treatment Patient Details Name: [name] MRN: [id number] DOB: May 11, 1940 Today's Date: 12/12/2020   History of Present Illness Pt admitted for strangulated inguinal hernia with complaints of N/V and abdominal pain. History includes anemia, HLD, and OA. She is now s/p R femoral hernia repair with mesh. Pt initially evaluated by PT on 9/23. On 9/24 pt had a code stroke called and imaging revealed B watershead infarcts R>L per neurology. 9/26 pt found to have R knee posteriorly subluxed with unclear etiology. Pt underwent closed reduction on 9/27 and is to remain WBAT with Knee Immobilizer on at all times.    PT Comments    Pt in bed.  Stated she needed a bath as urine was burning her skin.  Pt was given cloths and encouraged to participate in bathing as much as she was able.  Does well with R hand assist but L she is unable.  Rolling to left with mod a x 1 and max a to R for linen change and post peri care/bathing.   KI remained in place for bath.  She is able to participate in LLE supine ex 2 x 10.    LUE is noted to have increased extensor tone today.  When I saw her briefly on Thursday for transfer back to bed I did not notice any tone in LUE.  Some extensor tone noted in LLE at initiation of exercises but she is able to complete ROM without difficulty.  Decision to leave pt in bed today made with pt and daughter due to recent subluxation and re-manipulation.  They felt better waiting for further guidance from MD.     Recommendations for follow up therapy are one component of a multi-disciplinary discharge planning process, led by the attending physician.  Recommendations may be updated based on patient status, additional functional criteria and insurance authorization.  Follow Up Recommendations  SNF     Equipment Recommendations       Recommendations for Other Services       Precautions / Restrictions Precautions Precautions: Fall Required Braces or  Orthoses: Knee Immobilizer - Right Knee Immobilizer - Right: On at all times Restrictions Weight Bearing Restrictions: Yes RLE Weight Bearing: Weight bearing as tolerated     Mobility  Bed Mobility Overal bed mobility: Needs Assistance Bed Mobility: Rolling Rolling: Mod assist;+2 for physical assistance;Min assist         General bed mobility comments: rolling for bathing and linen change    Transfers                 General transfer comment: deferred  Ambulation/Gait                 Stairs             Wheelchair Mobility    Modified Rankin (Stroke Patients Only)       Balance                                            Cognition Arousal/Alertness: Awake/alert Behavior During Therapy: WFL for tasks assessed/performed Overall Cognitive Status: Impaired/Different from baseline                                        Exercises Other Exercises Other Exercises: bathing with  patient participation and RLE ex 2 x 10    General Comments        Pertinent Vitals/Pain Pain Assessment: No/denies pain Pain Intervention(s): Monitored during session    Home Living                      Prior Function            PT Goals (current goals can now be found in the care plan section) Progress towards PT goals: Progressing toward goals    Frequency    7X/week      PT Plan Current plan remains appropriate    Co-evaluation              AM-PAC PT "6 Clicks" Mobility   Outcome Measure  Help needed turning from your back to your side while in a flat bed without using bedrails?: A Lot Help needed moving from lying on your back to sitting on the side of a flat bed without using bedrails?: Total Help needed moving to and from a bed to a chair (including a wheelchair)?: Total Help needed standing up from a chair using your arms (e.g., wheelchair or bedside chair)?: Total Help needed to walk in  hospital room?: Total Help needed climbing 3-5 steps with a railing? : Total 6 Click Score: 7    End of Session   Activity Tolerance: Patient tolerated treatment well Patient left: in bed;with call bell/phone within reach;with bed alarm set Nurse Communication: Mobility status PT Visit Diagnosis: Muscle weakness (generalized) (M62.81);Hemiplegia and hemiparesis;Other abnormalities of gait and mobility (R26.89) Hemiplegia - Right/Left: Left Hemiplegia - dominant/non-dominant: Non-dominant Hemiplegia - caused by: Cerebral infarction Pain - Right/Left: Right Pain - part of body: Knee     Time: 9794-8016 PT Time Calculation (min) (ACUTE ONLY): 40 min  Charges:  $Therapeutic Exercise: 8-22 mins $Therapeutic Activity: 23-37 mins                     {Sharetta Ricchio, PTA 12/12/20, 11:08 AM

## 2020-12-12 NOTE — Progress Notes (Signed)
PROGRESS NOTE    Basya Casavant   GYJ:[id number]  DOB: 10-09-1940  DOA: 12/02/2020 PCP: Jon Billings, NP   Brief Narrative:  Lucilia Yanni is an 80 y.o. female with a past medical history of hypertension, hyperlipidemia, prediabetes, depression with anxiety, schizoaffective disorder, anemia, skin cancer, was admitted by the surgical team for strangulated inguinal hernia. The patient underwent open right femoral hernia repair with mesh and small bowel resection on 9/22. 9/24, rapid response was called the patient was found unresponsive.  Subsequently, code stroke was called.  On exam the patient was noted to have sonorous respirations while her eyes were wide open and did not respond to any external stimuli.  She had extensor posturing.  CT of the head showed a questionable acute cortical infarct within the right parietal lobe. MRI of the brain revealed bilateral MCA/ACA territory watershed infarcts.  It was noted that the patient's blood pressures had been low earlier in the morning.  During hospitalization, the patient was discovered to have a dislocation of the right knee.  An orthopedics consult was requested on 9/26.  And underwent reduction of the knee on 9/27 under IV anesthesia.  Subjective: She is trying to eat breakfast but has spilled it and states she needs help being fed.  She has no other complaints.    Assessment & Plan:   Principal Problem:   Strangulated inguinal hernia -She underwent right femoral hernia repair with mesh and small bowel resection on 9/22 - NG tube removed 9/27 - Zosyn was discontinued on 9/30 -diet has been downgraded to thin liquids-she is eating and drinking quite well now  Active Problems: Diarrhea -abdomen is benign on exam- she had not been drinking any ensure. She receives Colace but this is likely not the cause of her loose stools -Loose stools appear to be improving  Bilateral cerebral hemispheric watershed infarcts  related to postop hypotension -Patient was obtunded immediately after above-mentioned infarcts - She is beginning to awaken now - Goal blood pressures initially were between 130-180 - As BP was not 196/74 on 9/28, I increased her amlodipine from 5 to 10 mg -Her last blood pressure was 152/55 - Continue to follow BPs today  Right knee dislocation status post reduction on 9/27 - unfortunately, knee dislocated again on 10/1- Ortho and physical therapy were able to reduce it at the bedside-immobilizer was replaced and should not be removed    Hypertension -See above in regards to blood pressure medications    HLD (hyperlipidemia) -Continue Crestor    Depression with anxiety -Continue sertraline    Time spent in minutes: 35 DVT prophylaxis: SCDs Start: 12/07/20 1702 enoxaparin (LOVENOX) injection 40 mg Start: 12/03/20 0800 Code Status: DNR Family Communication: Plan discussed with daughter, Romelle Starcher- Her daughter was at bedside and I have discussed the plan with her and answered all of her questions. Level of Care: Level of care: Med-Surg Disposition Plan:  Status is: Inpatient  Remains inpatient appropriate because:Inpatient level of care appropriate due to severity of illness  Dispo: The patient is from: Home              Anticipated d/c is to: SNF              Patient currently is not medically stable to d/c.   Difficult to place patient No      Consultants:  General surgery Neurology Orthopedic surgery  Procedures:  open right femoral hernia repair with mesh and small bowel resection  Right knee reduction  Antimicrobials:  Anti-infectives (From admission, onward)    Start     Dose/Rate Route Frequency Ordered Stop   12/09/20 1400  piperacillin-tazobactam (ZOSYN) IVPB 3.375 g        3.375 g 12.5 mL/hr over 240 Minutes Intravenous Every 8 hours 12/09/20 0905 12/10/20 0959   12/02/20 2200  piperacillin-tazobactam (ZOSYN) IVPB 3.375 g  Status:  Discontinued         3.375 g 12.5 mL/hr over 240 Minutes Intravenous Every 8 hours 12/02/20 2138 12/09/20 0905   12/02/20 1827  ceFAZolin (ANCEF) IVPB 2g/100 mL premix  Status:  Discontinued       Note to Pharmacy: On call to OR   2 g 200 mL/hr over 30 Minutes Intravenous 60 min pre-op 12/02/20 1819 12/02/20 2212        Objective: Vitals:   12/12/20 0137 12/12/20 0501 12/12/20 0815 12/12/20 1205  BP: (!) 155/56 (!) 158/62 (!) 150/52 (!) 150/56  Pulse: 67 77 75 69  Resp: 18 18 15 16   Temp: 97.7 F (36.5 C) 97.7 F (36.5 C) 98.3 F (36.8 C) 98.9 F (37.2 C)  TempSrc: Oral Oral Oral Oral  SpO2: 98% 95% 99% 96%  Weight:      Height:        Intake/Output Summary (Last 24 hours) at 12/12/2020 1236 Last data filed at 12/12/2020 1203 Gross per 24 hour  Intake --  Output 925 ml  Net -925 ml    Filed Weights   12/04/20 0756 12/04/20 2111 12/08/20 1101  Weight: 82.5 kg 76.7 kg 86 kg    Examination: General exam: Appears comfortable  HEENT: PERRLA, oral mucosa moist, no sclera icterus or thrush Respiratory system: Clear to auscultation. Respiratory effort normal. Cardiovascular system: S1 & S2 heard, regular rate and rhythm Gastrointestinal system: Abdomen soft, non-tender, nondistended. Normal bowel sounds   Central nervous system: Alert and oriented. Left arm and leg weakness noted.  Extremities: No cyanosis, clubbing or edema Skin: No rashes or ulcers Psychiatry:  Mood & affect appropriate.      Data Reviewed: I have personally reviewed following labs and imaging studies  CBC: Recent Labs  Lab 12/06/20 0430 12/07/20 0346 12/08/20 0419 12/11/20 1239  WBC 9.8 10.2 9.7 10.6*  HGB 11.2* 11.0* 12.5 12.9  HCT 33.6* 32.7* 38.1 38.1  MCV 90.8 88.9 88.0 85.6  PLT 219 262 248 628    Basic Metabolic Panel: Recent Labs  Lab 12/06/20 0430 12/07/20 0346 12/08/20 0419 12/09/20 0504 12/11/20 1239  NA 141 141 136  --  131*  K 3.4* 3.4* 3.6  --  3.8  CL 108 108 106  --  99  CO2 24 20*  20*  --  21*  GLUCOSE 105* 93 119*  --  103*  BUN 35* 27* 18  --  13  CREATININE 0.55 0.58 0.55 0.44 0.50  CALCIUM 8.8* 8.9 8.8*  --  8.9  MG  --  1.8  --   --   --     GFR: Estimated Creatinine Clearance: 57.3 mL/min (by C-G formula based on SCr of 0.5 mg/dL). Liver Function Tests: No results for input(s): AST, ALT, ALKPHOS, BILITOT, PROT, ALBUMIN in the last 168 hours.  No results for input(s): LIPASE, AMYLASE in the last 168 hours.  No results for input(s): AMMONIA in the last 168 hours. Coagulation Profile: No results for input(s): INR, PROTIME in the last 168 hours. Cardiac Enzymes: No results for input(s): CKTOTAL, CKMB, CKMBINDEX, TROPONINI in the last 168 hours.  BNP (last 3 results) No results for input(s): PROBNP in the last 8760 hours. HbA1C: No results for input(s): HGBA1C in the last 72 hours. CBG: Recent Labs  Lab 12/07/20 1544  GLUCAP 106*    Lipid Profile: No results for input(s): CHOL, HDL, LDLCALC, TRIG, CHOLHDL, LDLDIRECT in the last 72 hours. Thyroid Function Tests: No results for input(s): TSH, T4TOTAL, FREET4, T3FREE, THYROIDAB in the last 72 hours. Anemia Panel: No results for input(s): VITAMINB12, FOLATE, FERRITIN, TIBC, IRON, RETICCTPCT in the last 72 hours. Urine analysis:    Component Value Date/Time   COLORURINE AMBER BIOCHEMICALS MAY BE AFFECTED BY COLOR (A) 06/25/2008 1242   APPEARANCEUR Cloudy (A) 08/31/2020 1039   LABSPEC 1.023 06/25/2008 1242   PHURINE 6.0 06/25/2008 1242   GLUCOSEU Negative 08/31/2020 1039   HGBUR NEGATIVE 06/25/2008 1242   BILIRUBINUR Negative 08/31/2020 1039   KETONESUR >80 (A) 06/25/2008 1242   PROTEINUR 1+ (A) 08/31/2020 1039   PROTEINUR NEGATIVE 06/25/2008 1242   UROBILINOGEN 0.2 06/25/2008 1242   NITRITE Positive (A) 08/31/2020 1039   NITRITE NEGATIVE 06/25/2008 1242   LEUKOCYTESUR 2+ (A) 08/31/2020 1039   Sepsis Labs: @LABRCNTIP (procalcitonin:4,lacticidven:4) ) Recent Results (from the past 240 hour(s))   Resp Panel by RT-PCR (Flu A&B, Covid) Nasopharyngeal Swab     Status: None   Collection Time: 12/02/20  4:39 PM   Specimen: Nasopharyngeal Swab; Nasopharyngeal(NP) swabs in vial transport medium  Result Value Ref Range Status   SARS Coronavirus 2 by RT PCR NEGATIVE NEGATIVE Final    Comment: (NOTE) SARS-CoV-2 target nucleic acids are NOT DETECTED.  The SARS-CoV-2 RNA is generally detectable in upper respiratory specimens during the acute phase of infection. The lowest concentration of SARS-CoV-2 viral copies this assay can detect is 138 copies/mL. A negative result does not preclude SARS-Cov-2 infection and should not be used as the sole basis for treatment or other patient management decisions. A negative result may occur with  improper specimen collection/handling, submission of specimen other than nasopharyngeal swab, presence of viral mutation(s) within the areas targeted by this assay, and inadequate number of viral copies(<138 copies/mL). A negative result must be combined with clinical observations, patient history, and epidemiological information. The expected result is Negative.  Fact Sheet for Patients:  EntrepreneurPulse.com.au  Fact Sheet for Healthcare Providers:  IncredibleEmployment.be  This test is no t yet approved or cleared by the [place] FDA and  has been authorized for detection and/or diagnosis of SARS-CoV-2 by FDA under an Emergency Use Authorization (EUA). This EUA will remain  in effect (meaning this test can be used) for the duration of the COVID-19 declaration under Section 564(b)(1) of the Act, 21 U.S.C.section 360bbb-3(b)(1), unless the authorization is terminated  or revoked sooner.       Influenza A by PCR NEGATIVE NEGATIVE Final   Influenza B by PCR NEGATIVE NEGATIVE Final    Comment: (NOTE) The Xpert Xpress SARS-CoV-2/FLU/RSV plus assay is intended as an aid in the diagnosis of influenza from  Nasopharyngeal swab specimens and should not be used as a sole basis for treatment. Nasal washings and aspirates are unacceptable for Xpert Xpress SARS-CoV-2/FLU/RSV testing.  Fact Sheet for Patients: EntrepreneurPulse.com.au  Fact Sheet for Healthcare Providers: IncredibleEmployment.be  This test is not yet approved or cleared by the [place] FDA and has been authorized for detection and/or diagnosis of SARS-CoV-2 by FDA under an Emergency Use Authorization (EUA). This EUA will remain in effect (meaning this test can be used) for the duration of the COVID-19  declaration under Section 564(b)(1) of the Act, 21 U.S.C. section 360bbb-3(b)(1), unless the authorization is terminated or revoked.  Performed at [facility], [address] [place], [place] [address]   MRSA Next Gen by PCR, Nasal     Status: None   Collection Time: 12/04/20  9:05 PM   Specimen: Nasal Mucosa; Nasal Swab  Result Value Ref Range Status   MRSA by PCR Next Gen NOT DETECTED NOT DETECTED Final    Comment: (NOTE) The GeneXpert MRSA Assay (FDA approved for NASAL specimens only), is one component of a comprehensive MRSA colonization surveillance program. It is not intended to diagnose MRSA infection nor to guide or monitor treatment for MRSA infections. Test performance is not FDA approved in patients less than 66 years old. Performed at [facility], [place], [place] [address]   SARS CORONAVIRUS 2 (TAT 6-24 HRS) Nasopharyngeal Nasopharyngeal Swab     Status: None   Collection Time: 12/10/20 10:58 AM   Specimen: Nasopharyngeal Swab  Result Value Ref Range Status   SARS Coronavirus 2 NEGATIVE NEGATIVE Final    Comment: (NOTE) SARS-CoV-2 target nucleic acids are NOT DETECTED.  The SARS-CoV-2 RNA is generally detectable in upper and lower respiratory specimens during the acute phase of infection. Negative results do not preclude  SARS-CoV-2 infection, do not rule out co-infections with other pathogens, and should not be used as the sole basis for treatment or other patient management decisions. Negative results must be combined with clinical observations, patient history, and epidemiological information. The expected result is Negative.  Fact Sheet for Patients: SugarRoll.be  Fact Sheet for Healthcare Providers: https://www.woods-mathews.com/  This test is not yet approved or cleared by the [place] FDA and  has been authorized for detection and/or diagnosis of SARS-CoV-2 by FDA under an Emergency Use Authorization (EUA). This EUA will remain  in effect (meaning this test can be used) for the duration of the COVID-19 declaration under Se ction 564(b)(1) of the Act, 21 U.S.C. section 360bbb-3(b)(1), unless the authorization is terminated or revoked sooner.  Performed at [facility], [place] [address], [place], [place] [address]          Radiology Studies: No results found.    Scheduled Meds:  amLODipine  10 mg Oral QHS   aspirin  81 mg Oral Daily   carvedilol  6.25 mg Oral BID WC   enoxaparin (LOVENOX) injection  40 mg Subcutaneous Q24H   feeding supplement  237 mL Oral BID BM   lisinopril  40 mg Oral Daily   mouth rinse  15 mL Mouth Rinse BID   rosuvastatin  10 mg Oral Daily   sertraline  25 mg Oral Daily   Continuous Infusions:  sodium chloride Stopped (12/09/20 1357)     LOS: 10 days      Debbe Odea, MD [facility] Pager: [email or web address] 12/12/2020, 12:36 PM

## 2020-12-13 DIAGNOSIS — S0990XA Unspecified injury of head, initial encounter: Secondary | ICD-10-CM | POA: Diagnosis not present

## 2020-12-13 DIAGNOSIS — Z880 Allergy status to penicillin: Secondary | ICD-10-CM | POA: Diagnosis not present

## 2020-12-13 DIAGNOSIS — F419 Anxiety disorder, unspecified: Secondary | ICD-10-CM | POA: Diagnosis present

## 2020-12-13 DIAGNOSIS — F329 Major depressive disorder, single episode, unspecified: Secondary | ICD-10-CM | POA: Diagnosis present

## 2020-12-13 DIAGNOSIS — N993 Prolapse of vaginal vault 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: Secondary | ICD-10-CM | POA: Diagnosis not present

## 2020-12-13 DIAGNOSIS — Z87891 Personal history of nicotine dependence: Secondary | ICD-10-CM | POA: Diagnosis not present

## 2020-12-13 DIAGNOSIS — M7989 Other specified soft tissue disorders: Secondary | ICD-10-CM | POA: Diagnosis not present

## 2020-12-13 DIAGNOSIS — R2689 Other abnormalities of gait and mobility: Secondary | ICD-10-CM | POA: Diagnosis not present

## 2020-12-13 DIAGNOSIS — Z20822 Contact with and (suspected) exposure to covid-19: Secondary | ICD-10-CM | POA: Diagnosis present

## 2020-12-13 DIAGNOSIS — M170 Bilateral primary osteoarthritis of knee: Secondary | ICD-10-CM | POA: Diagnosis not present

## 2020-12-13 DIAGNOSIS — R404 Transient alteration of awareness: Secondary | ICD-10-CM | POA: Diagnosis not present

## 2020-12-13 DIAGNOSIS — Z79899 Other long term (current) drug therapy: Secondary | ICD-10-CM | POA: Diagnosis not present

## 2020-12-13 DIAGNOSIS — Z85828 Personal history of other malignant neoplasm of skin: Secondary | ICD-10-CM | POA: Diagnosis not present

## 2020-12-13 DIAGNOSIS — R197 Diarrhea, unspecified: Secondary | ICD-10-CM | POA: Diagnosis not present

## 2020-12-13 DIAGNOSIS — M25512 Pain in left shoulder: Secondary | ICD-10-CM | POA: Diagnosis not present

## 2020-12-13 DIAGNOSIS — M25461 Effusion, right knee: Secondary | ICD-10-CM | POA: Diagnosis not present

## 2020-12-13 DIAGNOSIS — E785 Hyperlipidemia, unspecified: Secondary | ICD-10-CM | POA: Diagnosis present

## 2020-12-13 DIAGNOSIS — R262 Difficulty in walking, not elsewhere classified: Secondary | ICD-10-CM | POA: Diagnosis not present

## 2020-12-13 DIAGNOSIS — W19XXXA Unspecified fall, initial encounter: Secondary | ICD-10-CM | POA: Diagnosis not present

## 2020-12-13 DIAGNOSIS — Z808 Family history of malignant neoplasm of other organs or systems: Secondary | ICD-10-CM | POA: Diagnosis not present

## 2020-12-13 DIAGNOSIS — R9082 White matter disease, unspecified: Secondary | ICD-10-CM | POA: Diagnosis not present

## 2020-12-13 DIAGNOSIS — K56609 Unspecified intestinal obstruction, unspecified as to partial versus complete obstruction: Secondary | ICD-10-CM | POA: Diagnosis not present

## 2020-12-13 DIAGNOSIS — I6389 Other cerebral infarction: Secondary | ICD-10-CM | POA: Diagnosis not present

## 2020-12-13 DIAGNOSIS — F22 Delusional disorders: Secondary | ICD-10-CM | POA: Diagnosis not present

## 2020-12-13 DIAGNOSIS — Z8261 Family history of arthritis: Secondary | ICD-10-CM | POA: Diagnosis not present

## 2020-12-13 DIAGNOSIS — M542 Cervicalgia: Secondary | ICD-10-CM | POA: Diagnosis not present

## 2020-12-13 DIAGNOSIS — I639 Cerebral infarction, unspecified: Secondary | ICD-10-CM | POA: Diagnosis not present

## 2020-12-13 DIAGNOSIS — S0993XA Unspecified injury of face, initial encounter: Secondary | ICD-10-CM | POA: Diagnosis not present

## 2020-12-13 DIAGNOSIS — Z7982 Long term (current) use of aspirin: Secondary | ICD-10-CM | POA: Diagnosis not present

## 2020-12-13 DIAGNOSIS — N390 Urinary tract infection, site not specified: Secondary | ICD-10-CM | POA: Diagnosis present

## 2020-12-13 DIAGNOSIS — Y792 Prosthetic and other implants, materials and accessory orthopedic devices associated with adverse incidents: Secondary | ICD-10-CM | POA: Diagnosis present

## 2020-12-13 DIAGNOSIS — T148XXA Other injury of unspecified body region, initial encounter: Secondary | ICD-10-CM | POA: Diagnosis not present

## 2020-12-13 DIAGNOSIS — Z9049 Acquired absence of other specified parts of digestive tract: Secondary | ICD-10-CM | POA: Diagnosis not present

## 2020-12-13 DIAGNOSIS — E871 Hypo-osmolality and hyponatremia: Secondary | ICD-10-CM | POA: Diagnosis not present

## 2020-12-13 DIAGNOSIS — S83521A Sprain of posterior cruciate ligament of right knee, initial encounter: Secondary | ICD-10-CM | POA: Diagnosis present

## 2020-12-13 DIAGNOSIS — S83124A Posterior dislocation of proximal end of tibia, right knee, initial encounter: Secondary | ICD-10-CM | POA: Diagnosis not present

## 2020-12-13 DIAGNOSIS — S82001A Unspecified fracture of right patella, initial encounter for closed fracture: Secondary | ICD-10-CM | POA: Diagnosis not present

## 2020-12-13 DIAGNOSIS — I10 Essential (primary) hypertension: Secondary | ICD-10-CM | POA: Diagnosis present

## 2020-12-13 DIAGNOSIS — Z8349 Family history of other endocrine, nutritional and metabolic diseases: Secondary | ICD-10-CM | POA: Diagnosis not present

## 2020-12-13 DIAGNOSIS — Z8719 Personal history of other diseases of the digestive system: Secondary | ICD-10-CM | POA: Diagnosis not present

## 2020-12-13 DIAGNOSIS — R0989 Other specified symptoms and signs involving the circulatory and respiratory systems: Secondary | ICD-10-CM | POA: Diagnosis not present

## 2020-12-13 DIAGNOSIS — I959 Hypotension, unspecified: Secondary | ICD-10-CM | POA: Diagnosis not present

## 2020-12-13 DIAGNOSIS — T84022A Instability of internal right knee prosthesis, initial encounter: Secondary | ICD-10-CM | POA: Diagnosis present

## 2020-12-13 DIAGNOSIS — Z66 Do not resuscitate: Secondary | ICD-10-CM | POA: Diagnosis present

## 2020-12-13 DIAGNOSIS — W06XXXA Fall from bed, initial encounter: Secondary | ICD-10-CM | POA: Diagnosis present

## 2020-12-13 DIAGNOSIS — S80919A Unspecified superficial injury of unspecified knee, initial encounter: Secondary | ICD-10-CM | POA: Diagnosis not present

## 2020-12-13 DIAGNOSIS — Z9889 Other specified postprocedural states: Secondary | ICD-10-CM | POA: Diagnosis not present

## 2020-12-13 DIAGNOSIS — R41841 Cognitive communication deficit: Secondary | ICD-10-CM | POA: Diagnosis not present

## 2020-12-13 DIAGNOSIS — Z7401 Bed confinement status: Secondary | ICD-10-CM | POA: Diagnosis not present

## 2020-12-13 DIAGNOSIS — Z8673 Personal history of transient ischemic attack (TIA), and cerebral infarction without residual deficits: Secondary | ICD-10-CM | POA: Diagnosis not present

## 2020-12-13 DIAGNOSIS — S4992XA Unspecified injury of left shoulder and upper arm, initial encounter: Secondary | ICD-10-CM | POA: Diagnosis not present

## 2020-12-13 DIAGNOSIS — F418 Other specified anxiety disorders: Secondary | ICD-10-CM | POA: Diagnosis not present

## 2020-12-13 DIAGNOSIS — F4323 Adjustment disorder with mixed anxiety and depressed mood: Secondary | ICD-10-CM | POA: Diagnosis not present

## 2020-12-13 DIAGNOSIS — M6281 Muscle weakness (generalized): Secondary | ICD-10-CM | POA: Diagnosis not present

## 2020-12-13 DIAGNOSIS — S8991XA Unspecified injury of right lower leg, initial encounter: Secondary | ICD-10-CM | POA: Diagnosis present

## 2020-12-13 DIAGNOSIS — L89151 Pressure ulcer of sacral region, stage 1: Secondary | ICD-10-CM | POA: Diagnosis present

## 2020-12-13 DIAGNOSIS — D649 Anemia, unspecified: Secondary | ICD-10-CM | POA: Diagnosis not present

## 2020-12-13 DIAGNOSIS — B9689 Other specified bacterial agents as the cause of diseases classified elsewhere: Secondary | ICD-10-CM | POA: Diagnosis not present

## 2020-12-13 DIAGNOSIS — Y92122 Bedroom in nursing home as the place of occurrence of the external cause: Secondary | ICD-10-CM | POA: Diagnosis not present

## 2020-12-13 DIAGNOSIS — Z23 Encounter for immunization: Secondary | ICD-10-CM | POA: Diagnosis present

## 2020-12-13 DIAGNOSIS — S83104D Unspecified dislocation of right knee, subsequent encounter: Secondary | ICD-10-CM | POA: Diagnosis not present

## 2020-12-13 DIAGNOSIS — M25561 Pain in right knee: Secondary | ICD-10-CM | POA: Diagnosis not present

## 2020-12-13 DIAGNOSIS — K130 Diseases of lips: Secondary | ICD-10-CM | POA: Diagnosis not present

## 2020-12-13 DIAGNOSIS — R1312 Dysphagia, oropharyngeal phase: Secondary | ICD-10-CM | POA: Diagnosis not present

## 2020-12-13 DIAGNOSIS — K4030 Unilateral inguinal hernia, with obstruction, without gangrene, not specified as recurrent: Secondary | ICD-10-CM | POA: Diagnosis not present

## 2020-12-13 DIAGNOSIS — S83104A Unspecified dislocation of right knee, initial encounter: Secondary | ICD-10-CM | POA: Diagnosis present

## 2020-12-13 DIAGNOSIS — Z833 Family history of diabetes mellitus: Secondary | ICD-10-CM | POA: Diagnosis not present

## 2020-12-13 DIAGNOSIS — Z043 Encounter for examination and observation following other accident: Secondary | ICD-10-CM | POA: Diagnosis not present

## 2020-12-13 DIAGNOSIS — G9340 Encephalopathy, unspecified: Secondary | ICD-10-CM | POA: Diagnosis not present

## 2020-12-13 DIAGNOSIS — I720 Aneurysm of carotid artery: Secondary | ICD-10-CM | POA: Diagnosis not present

## 2020-12-13 DIAGNOSIS — F259 Schizoaffective disorder, unspecified: Secondary | ICD-10-CM | POA: Diagnosis present

## 2020-12-13 DIAGNOSIS — Z8249 Family history of ischemic heart disease and other diseases of the circulatory system: Secondary | ICD-10-CM | POA: Diagnosis not present

## 2020-12-13 LAB — RESP PANEL BY RT-PCR (FLU A&B, COVID) ARPGX2
Influenza A by PCR: NEGATIVE
Influenza B by PCR: NEGATIVE
SARS Coronavirus 2 by RT PCR: NEGATIVE

## 2020-12-13 MED ORDER — TRAMADOL HCL 50 MG PO TABS: 50.0000 mg | ORAL_TABLET | Freq: Four times a day (QID) | ORAL | 0 refills | Status: DC | PRN

## 2020-12-13 MED ORDER — HYDROCODONE-ACETAMINOPHEN 5-325 MG PO TABS: 1.0000 | ORAL_TABLET | ORAL | 0 refills | Status: DC | PRN

## 2020-12-13 NOTE — Care Management Important Message (Signed)
Important Message  Patient Details  Name: [name] MRN: [id number] Date of Birth: [birth year]   Medicare Important Message Given:  Yes     Matalyn Nawaz, [place] 12/13/2020, 2:09 PM

## 2020-12-13 NOTE — Discharge Summary (Signed)
Physician Discharge Summary  Jagger Beahm MEQ:[id number] DOB: July 11, 1940 DOA: 12/02/2020  PCP: Jon Billings, NP  Admit date: 12/02/2020 Discharge date: 12/13/2020  Admitted From: home  Disposition:  SNF   Recommendations for Outpatient Follow-up:  Immobilizer on right leg should not be removed at any point of her SNF stay- she is allowed to stand and pivot and should be up in a chair with leg propped up Keep left arm elevated to prevent dependent edema  Discharge Condition:  stable   CODE STATUS:  DNR   Diet recommendation:  low sodium Consultations: Neurology General surgery Orthopedic surgery Procedures/Studies: open right femoral hernia repair with mesh and small bowel resection  Right knee reduction x 2    Discharge Diagnoses:  Principal Problem:   Strangulated inguinal hernia Active Problems:   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with coma ([place])   Knee dislocation, right, initial encounter   Anemia   Prolapse of vaginal vault 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 female with a past medical history of hypertension, hyperlipidemia, prediabetes, depression with anxiety, schizoaffective disorder, anemia, skin cancer, was admitted by the surgical team for strangulated inguinal hernia. The patient underwent open right femoral hernia repair with mesh and small bowel resection on 9/22. 9/24, rapid response was called the patient was found unresponsive.  Subsequently, code stroke was called.  On exam the patient was noted to have sonorous respirations while her eyes were wide open and did not respond to any external stimuli.  She had extensor posturing.  CT of the head showed a questionable acute cortical infarct within the right parietal lobe. MRI of the brain revealed bilateral MCA/ACA territory watershed infarcts.  It was noted  that the patient's blood pressures had been low earlier in the morning.   During hospitalization, the patient was discovered to have a dislocation of the right knee.  An orthopedics consult was requested on 9/26.  And underwent reduction of the knee on 9/27 under IV anesthesia.  Hospital Course:  Principal Problem:   Strangulated inguinal hernia -She underwent right femoral hernia repair with mesh and small bowel resection on 9/22 - NG tube removed 9/27 - Zosyn was discontinued on 9/30 -diet has been downgraded to thin liquids-she is eating and drinking quite well now   Active Problems: Diarrhea -abdomen is benign on exam- she had not been drinking any ensure. She receives Colace but this is likely not the cause of her loose stools -Loose stools appear to be improving   Bilateral cerebral hemispheric watershed infarcts related to postop hypotension -Patient was obtunded immediately after above-mentioned infarcts - She is beginning to awaken now - Goal blood pressures initially were between 130-180 - As BP was no at goal> 196/74 on 9/28, I increased her amlodipine from 5 to 10 mg - BPs have improved    Right knee dislocation status post reduction on 9/27 - unfortunately, knee dislocated again on 10/1- Ortho and physical therapy were able to reduce it at the bedside-immobilizer was replaced and should not be removed     Hypertension -See above in regards to blood pressure medications     HLD (hyperlipidemia) -Continue Crestor     Depression with anxiety -Continue sertraline     Discharge Exam: Vitals:   12/13/20 0539 12/13/20 0717  BP: (!) 178/62 (!) 169/64  Pulse: 72 67  Resp: 16  18  Temp: 98.5 F (36.9 C) 97.7 F (36.5 C)  SpO2: 99% 98%   Vitals:   12/12/20 2033 12/13/20 0000 12/13/20 0539 12/13/20 0717  BP: (!) 156/62 (!) 159/60 (!) 178/62 (!) 169/64  Pulse: 66 70 72 67  Resp: 18 16 16 18   Temp: 98.1 F (36.7 C) 98.2 F (36.8 C) 98.5 F (36.9 C) 97.7 F (36.5  C)  TempSrc: Oral  Oral Oral  SpO2: 97% 96% 99% 98%  Weight:      Height:        General: Pt is alert, awake, not in acute distress Cardiovascular: RRR, S1/S2 +, no rubs, no gallops Respiratory: CTA bilaterally, no wheezing, no rhonchi Abdominal: Soft, NT, ND, bowel sounds + Extremities: no edema, no cyanosis   Discharge Instructions  Discharge Instructions     Diet - low sodium heart healthy   Complete by: As directed    Increase activity slowly   Complete by: As directed    No wound care   Complete by: As directed       Allergies as of 12/13/2020       Reactions   Penicillins Rash   TOLERATED CEFAZOLIN        Medication List     STOP taking these medications    traMADol 50 MG tablet Commonly known as: ULTRAM       TAKE these medications    acetaminophen 325 MG tablet Commonly known as: TYLENOL Take 2 tablets (650 mg total) by mouth every 6 (six) hours as needed for fever, headache or mild pain.   amLODipine 10 MG tablet Commonly known as: NORVASC Take 1 tablet (10 mg total) by mouth at bedtime. What changed:  medication strength how much to take   aspirin 81 MG chewable tablet Chew 1 tablet (81 mg total) by mouth daily.   carvedilol 6.25 MG tablet Commonly known as: COREG Take 1 tablet (6.25 mg total) by mouth 2 (two) times daily with a meal.   docusate sodium 100 MG capsule Commonly known as: COLACE Take 1 capsule (100 mg total) by mouth 2 (two) times daily.   gabapentin 100 MG capsule Commonly known as: NEURONTIN Take 1 capsule (100 mg total) by mouth at bedtime. Take 1 capsule at bedtime. If no relief, can go up to 2 tablets at bedtime after 3 days.   HYDROcodone-acetaminophen 5-325 MG tablet Commonly known as: NORCO/VICODIN Take 1 tablet by mouth every 4 (four) hours as needed for severe pain.   lisinopril 40 MG tablet Commonly known as: ZESTRIL Take 1 tablet (40 mg total) by mouth daily.   rosuvastatin 10 MG tablet Commonly  known as: Crestor Take 1 tablet (10 mg total) by mouth daily.   sertraline 25 MG tablet Commonly known as: ZOLOFT Take 1 tablet (25 mg total) by mouth daily.   tiZANidine 4 MG tablet Commonly known as: ZANAFLEX Take 1 tablet (4 mg total) by mouth every 8 (eight) hours as needed for muscle spasms. What changed:  when to take this reasons to take this   triamcinolone cream 0.1 % Commonly known as: KENALOG Apply 1 application topically 2 (two) times daily.        Contact information for after-discharge care     Destination     [facility] .   Service: Skilled Nursing Contact information: [address] [place] [phone]  Allergies  Allergen Reactions   Penicillins Rash    TOLERATED CEFAZOLIN      CT ANGIO HEAD NECK W WO CM  Result Date: 12/04/2020 CLINICAL DATA:  Neuro deficit, acute, stroke suspected. Unresponsive. EXAM: CT ANGIOGRAPHY HEAD AND NECK TECHNIQUE: Multidetector CT imaging of the head and neck was performed using the standard protocol during bolus administration of intravenous contrast. Multiplanar CT image reconstructions and MIPs were obtained to evaluate the vascular anatomy. Carotid stenosis measurements (when applicable) are obtained utilizing NASCET criteria, using the distal internal carotid diameter as the denominator. CONTRAST:  62mL OMNIPAQUE IOHEXOL 350 MG/ML SOLN COMPARISON:  No pertinent prior exams available for comparison. FINDINGS: CT HEAD FINDINGS Brain: Mild generalized cerebral atrophy. A subtle acute cortical infarct is questioned within the right parietal lobe (for instance as seen on series 3, image 27). Background advanced patchy and ill-defined hypoattenuation within the cerebral white matter, nonspecific but compatible with chronic small vessel ischemic disease. There is no acute intracranial hemorrhage. No extra-axial fluid collection. No evidence of an  intracranial mass. No midline shift. Vascular: No hyperdense vessel.  Atherosclerotic calcifications. Skull: Normal. Negative for fracture or focal lesion. Sinuses: No significant paranasal sinus disease. Orbits: No mass or acute finding. Review of the MIP images confirms the above findings These results were called by telephone at the time of interpretation on 12/04/2020 at 12:55 pm to provider ERIC [facility] , who verbally acknowledged these results. CTA NECK FINDINGS Aortic arch: Standard aortic branching. Atherosclerotic plaque within the visualized aortic arch and proximal major branch vessels of the neck. No hemodynamically significant innominate or proximal subclavian artery stenosis. Right carotid system: CCA and ICA patent within the neck without hemodynamically significant stenosis (50% or greater). Mild-to-moderate atherosclerotic plaque within the carotid bifurcation and proximal ICA. Left carotid system: CCA and ICA patent within the neck without hemodynamically significant stenosis (50% or greater). Moderate atherosclerotic plaque within the carotid bifurcation and proximal ICA. Vertebral arteries: Vertebral arteries patent within the neck bilaterally. Mild atherosclerotic narrowing at the origin of the right vertebral artery. Skeleton: Cervical spondylosis. No acute bony abnormality or aggressive osseous lesion. Other neck: No neck mass or cervical lymphadenopathy. Upper chest: Patchy opacity within the imaged left lower lobe pneumonia or atelectasis. Review of the MIP images confirms the above findings CTA HEAD FINDINGS Anterior circulation: The intracranial internal carotid arteries are patent. Atherosclerotic plaque within both vessels with up to moderate stenosis. The M1 middle cerebral arteries are patent. Significant atherosclerotic irregularity of the M2 and more distal middle cerebral artery vessels bilaterally. Most notably, there are sites of up to severe stenosis within the proximal M2 right  MCA vessels. No proximal M2 branch occlusion is identified. The anterior cerebral arteries are patent. 2 mm inferiorly projecting vascular protrusion arising from the supraclinoid left ICA, likely reflecting an aneurysm. Posterior circulation: The intracranial vertebral arteries are patent. The basilar artery is patent. The posterior cerebral arteries are patent. Hypoplastic right P1 segment with sizable right posterior communicating artery. The left posterior communicating artery is hypoplastic or absent Venous sinuses: Within the limitations of contrast timing, no convincing thrombus. Anatomic variants: As described Review of the MIP images confirms the above findings No intracranial large vessel occlusion identified. These results were called by telephone at the time of interpretation on 12/04/2020 at 12:56 pm to provider ERIC [facility] , who verbally acknowledged these results. IMPRESSION: CT head: 1. No evidence of acute intracranial hemorrhage. 2. A small acute cortical infarct is questioned within the right parietal lobe.  3. Advanced chronic small vessel ischemic changes within the cerebral white matter. 4. Generalized parenchymal atrophy. CTA neck: 1. The common carotid, internal carotid and vertebral arteries are patent within the neck without hemodynamically significant stenosis (50% or greater). 2. Opacity within the imaged left lower lobe, which may reflect atelectasis or pneumonia. CTA Head: 1. No intracranial large vessel occlusion identified. 2. Atherosclerotic irregularity of the M2 and more distal middle cerebral artery vessels bilaterally. Most notably, there are sites of up to severe stenosis within proximal right M2 MCA vessels. 3. 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 Electronically Signed   By: Kellie Simmering D.O.   On: 12/04/2020 13:07   DG Knee 1-2 Views Right  Result Date: 12/06/2020 CLINICAL DATA:  RIGHT knee dislocation EXAM: RIGHT KNEE - 1-2 VIEW COMPARISON:  None.  FINDINGS: Lateral projection demonstrates anterior dislocation of the femoral component in relation to the tibial component. No evidence of fracture. IMPRESSION: Anterior dislocation the knee joint. Electronically Signed   By: Suzy Bouchard M.D.   On: 12/06/2020 14:46   DG Abd 1 View  Result Date: 12/07/2020 CLINICAL DATA:  Ileus EXAM: ABDOMEN - 1 VIEW COMPARISON:  12/02/2020 FINDINGS: Cutaneous staples over the low pelvic region. Overall nonobstructed gas pattern with resolution of previously noted dilated small bowel. Aortic atherosclerosis. No suspicious calcifications. IMPRESSION: Nonobstructed bowel-gas pattern Electronically Signed   By: Donavan Foil M.D.   On: 12/07/2020 19:50   DG Abd 1 View  Result Date: 12/02/2020 CLINICAL DATA:  NG tube placement. EXAM: ABDOMEN - 1 VIEW COMPARISON:  CT earlier today. FINDINGS: Placement of enteric tube, the tip and side port are within a large hiatal hernia in the lower thorax. Dilated small bowel in the central abdomen similar to CT earlier today. IMPRESSION: Enteric tube with tip and side-port in the large hiatal hernia in the lower thorax. Dilated small bowel in the central abdomen similar to CT earlier today. Electronically Signed   By: Keith Rake M.D.   On: 12/02/2020 21:23   MR ANGIO HEAD WO CONTRAST  Result Date: 12/04/2020 CLINICAL DATA:  Initial evaluation for neuro deficit, stroke suspected. EXAM: MRI HEAD WITHOUT CONTRAST MRA HEAD WITHOUT CONTRAST TECHNIQUE: Multiplanar, multi-echo pulse sequences of the brain and surrounding structures were acquired without intravenous contrast. Angiographic images of the Circle of Willis were acquired using MRA technique without intravenous contrast. COMPARISON:  CTA from earlier the same day. FINDINGS: MRI HEAD FINDINGS Brain: Cerebral volume within normal limits for age. Patchy T2/FLAIR hyperintensity involving the periventricular deep white matter both cerebral hemispheres as well as the pons, most  consistent with chronic small vessel ischemic disease, moderate in nature. Scattered areas of restricted diffusion involving the cortical and subcortical aspect of the right frontal, parietal, and occipital lobes, consistent with acute ischemic infarcts. These are positioned in a linear watershed distribution. There are similar but less pronounced watershed type infarcts involving the contralateral left cerebral hemisphere, with involvement of the left frontal and parietal lobes. No associated hemorrhage or mass effect. No other evidence for acute or chronic intracranial hemorrhage. No mass lesion, midline shift or mass effect. No hydrocephalus or extra-axial fluid collection. Pituitary gland suprasellar region normal. Midline structures intact. Vascular: Major intracranial vascular flow voids are maintained. Skull and upper cervical spine: Prominent osteoarthritic changes with degenerative thickening noted about the C1-2 articulation and tectorial membrane. No significant stenosis at the craniocervical junction. Bone marrow signal intensity within normal limits. No scalp soft tissue abnormality. Sinuses/Orbits: Globes  and orbital soft tissues within normal limits. Paranasal sinuses are clear. No mastoid effusion. Inner ear structures grossly normal. Other: None. MRA HEAD FINDINGS Anterior circulation: Examination degraded by motion artifact. Visualized distal cervical segments of the internal carotid arteries are patent with antegrade flow. Petrous, cavernous, and supraclinoid segments patent without hemodynamically significant stenosis. 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 A1 segments patent. Normal anterior communicating artery complex. Anterior cerebral arteries patent without significant stenosis. No M1 stenosis or occlusion. Normal MCA bifurcations. Distal MCA branches perfused and symmetric. Distal small vessel atheromatous irregularity, better  evaluated on prior CTA. Posterior circulation: Vertebral arteries widely patent to the vertebrobasilar junction. Right vertebral artery dominant. Both PICA origins patent and normal. Basilar patent to its distal aspect without stenosis. Superior cerebellar arteries patent bilaterally. Left PCA supplied via the basilar. Right PCA supplied via a hypoplastic right P1 segment and robust right posterior communicating artery. Both PCAs remain patent to their distal aspects without appreciable stenosis. Distal small vessel atheromatous irregularity. Anatomic variants: Hypoplastic right P1 segment and robust right posterior communicating artery. IMPRESSION: MRI HEAD IMPRESSION: 1. 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 No associated hemorrhage or mass effect. 2. Underlying moderately advanced chronic microvascular ischemic disease. MRA HEAD IMPRESSION: 1. Negative intracranial MRA for large vessel occlusion. No hemodynamically significant or correctable stenosis. 2. Distal small vessel atheromatous irregularity, better evaluated on prior CTA. 3. 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 Electronically Signed   By: Jeannine Boga M.D.   On: 12/04/2020 21:22   MR BRAIN WO CONTRAST  Result Date: 12/04/2020 CLINICAL DATA:  Initial evaluation for neuro deficit, stroke suspected. EXAM: MRI HEAD WITHOUT CONTRAST MRA HEAD WITHOUT CONTRAST TECHNIQUE: Multiplanar, multi-echo pulse sequences of the brain and surrounding structures were acquired without intravenous contrast. Angiographic images of the Circle of Willis were acquired using MRA technique without intravenous contrast. COMPARISON:  CTA from earlier the same day. FINDINGS: MRI HEAD FINDINGS Brain: Cerebral volume within normal limits for age. Patchy T2/FLAIR hyperintensity involving the periventricular deep white  matter both cerebral hemispheres as well as the pons, most consistent with chronic small vessel ischemic disease, moderate in nature. Scattered areas of restricted diffusion involving the cortical and subcortical aspect of the right frontal, parietal, and occipital lobes, consistent with acute ischemic infarcts. These are positioned in a linear watershed distribution. There are similar but less pronounced watershed type infarcts involving the contralateral left cerebral hemisphere, with involvement of the left frontal and parietal lobes. No associated hemorrhage or mass effect. No other evidence for acute or chronic intracranial hemorrhage. No mass lesion, midline shift or mass effect. No hydrocephalus or extra-axial fluid collection. Pituitary gland suprasellar region normal. Midline structures intact. Vascular: Major intracranial vascular flow voids are maintained. Skull and upper cervical spine: Prominent osteoarthritic changes with degenerative thickening noted about the C1-2 articulation and tectorial membrane. No significant stenosis at the craniocervical junction. Bone marrow signal intensity within normal limits. No scalp soft tissue abnormality. Sinuses/Orbits: Globes and orbital soft tissues within normal limits. Paranasal sinuses are clear. No mastoid effusion. Inner ear structures grossly normal. Other: None. MRA HEAD FINDINGS Anterior circulation: Examination degraded by motion artifact. Visualized distal cervical segments of the internal carotid arteries are patent with antegrade flow. Petrous, cavernous, and supraclinoid segments patent without hemodynamically significant stenosis. Possible 2 mm aneurysm arising  from the supraclinoid left ICA again noted, better seen on prior CTA. A1 segments patent. Normal anterior communicating artery complex. Anterior cerebral arteries patent without significant stenosis. No M1 stenosis or occlusion. Normal MCA bifurcations. Distal MCA branches perfused and  symmetric. Distal small vessel atheromatous irregularity, better evaluated on prior CTA. Posterior circulation: Vertebral arteries widely patent to the vertebrobasilar junction. Right vertebral artery dominant. Both PICA origins patent and normal. Basilar patent to its distal aspect without stenosis. Superior cerebellar arteries patent bilaterally. Left PCA supplied via the basilar. Right PCA supplied via a hypoplastic right P1 segment and robust right posterior communicating artery. Both PCAs remain patent to their distal aspects without appreciable stenosis. Distal small vessel atheromatous irregularity. Anatomic variants: Hypoplastic right P1 segment and robust right posterior communicating artery. IMPRESSION: MRI HEAD IMPRESSION: 1. 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 No associated hemorrhage or mass effect. 2. Underlying moderately advanced chronic microvascular ischemic disease. MRA HEAD IMPRESSION: 1. Negative intracranial MRA for large vessel occlusion. No hemodynamically significant or correctable stenosis. 2. Distal small vessel atheromatous irregularity, better evaluated on prior CTA. 3. 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 Electronically Signed   By: Jeannine Boga M.D.   On: 12/04/2020 21:22   CT ABDOMEN PELVIS W CONTRAST  Result Date: 12/02/2020 CLINICAL DATA:  Abdominal pain, acute, nonlocalized. EXAM: CT ABDOMEN AND PELVIS WITH CONTRAST TECHNIQUE: Multidetector CT imaging of the abdomen and pelvis was performed using the standard protocol following bolus administration of intravenous contrast. CONTRAST:  22mL OMNIPAQUE IOHEXOL 350 MG/ML SOLN COMPARISON:  12/25/2011 FINDINGS: Lower chest: Lung bases are clear. Large hiatal hernia. There is a right Bochdalek hernia containing fat in the posterior right chest. No pleural effusions.  Hepatobiliary: Normal appearance of the liver, gallbladder and portal venous system. No biliary dilatation. Pancreas: Unremarkable. No pancreatic ductal dilatation or surrounding inflammatory changes. Spleen: Normal in size without focal abnormality. Adrenals/Urinary Tract: Normal adrenal glands. Bilateral low-density renal cysts. However, there is a hyperdense exophytic structure along the anterior left kidney on sequence 2 image 35 that measures up to 1.0 cm. This may represent a hyperdense cyst due to the fact that the Hounsfield units do not significantly change on the delayed imaging but this lesion is technically indeterminate. Negative for hydronephrosis. Mild distention of the urinary bladder. Stomach/Bowel: Large hiatal hernia. Dilated loops of small bowel in the abdomen and pelvis. Dilated loop of bowel extending into a right inguinal hernia and there is a transition point associated with this inguinal hernia. Stranding associated with this right inguinal hernia. Findings are suggestive for an incarcerated inguinal hernia. Distal small bowel is decompressed. Colon is decompressed. Vascular/Lymphatic: Diffuse atherosclerotic disease in the abdominal aorta without aneurysm. Mildly prominent right inguinal lymph nodes that may be reactive. Right iliac lymph node on series 2, image 57 measures 0.8 cm in the short axis. Otherwise, no significant abdominal or pelvic lymph node enlargement. Reproductive: Status post hysterectomy. No adnexal masses. Other: Bilateral inguinal hernias. Right inguinal hernia contains a dilated loop of small bowel. Left inguinal hernia contains fat. Negative for free air. Negative for ascites. Musculoskeletal: Small amount of stranding in the right groin and around the right inguinal hernia. Grade 1 anterolisthesis of L4 on L5 likely secondary to facet arthropathy. Disc space loss with endplate changes at Z6-X0. IMPRESSION: 1. Small bowel obstruction secondary to an incarcerated right  inguinal hernia. Subcutaneous edema  in the right groin and right inguinal region likely secondary to the incarcerated hernia. 2. Bilateral renal cysts with an indeterminate hyperdense structure in the anterior left kidney. This could be more definitively characterized with a pre and post-contrast MRI. 3.  Aortic Atherosclerosis (ICD10-I70.0). 4. Left inguinal hernia containing fat. 5. Large hiatal hernia. 6. Right Bochdalek diaphragmatic hernia. These results were called by telephone at the time of interpretation on 12/02/2020 at 2:16 pm to provider [facility] , who verbally acknowledged these results. Electronically Signed   By: Markus Daft M.D.   On: 12/02/2020 14:17   DG Chest Port 1 View  Result Date: 12/04/2020 CLINICAL DATA:  Dyspnea EXAM: PORTABLE CHEST 1 VIEW COMPARISON:  None. Findings are correlated with CT examination of the abdomen pelvis of 12/02/2020 FINDINGS: Lung volumes are small. No pneumothorax or pleural effusion. Right paratracheal soft tissue thickening may reflect vascular shadow on this rotated examination, however, a paramediastinal soft tissue mass or adenopathy could appear similarly. This is not well characterized on this examination. Cardiac size is within normal limits. Opacity within the right cardiophrenic angle may relate to the patient's known large hiatal hernia. Nasogastric tube appears looped within the inferior thorax, likely within the known hiatal hernia. IMPRESSION: Pulmonary hypoinflation. Nasogastric tube looped within the inferior thorax, likely within known large hiatal hernia. Right paratracheal soft tissue opacity, not well characterized, possibly artifactual and related to rotation. This would be better assessed with a standard two view chest radiograph or CT examination when the patient is clinically able to do so. Electronically Signed   By: Fidela Salisbury M.D.   On: 12/04/2020 21:41   DG Knee Right Port  Result Date: 12/07/2020 CLINICAL DATA:  Surgical  manipulation of right knee joint EXAM: PORTABLE RIGHT KNEE - 1-2 VIEW COMPARISON:  12/06/2020 FINDINGS: Frontal and cross-table lateral views of the right knee demonstrate reduction of prior dislocation. The 3 component right knee arthroplasty demonstrates anatomic alignment. No evidence of fracture. Small joint effusion. IMPRESSION: 1. Reduction of prior right knee arthroplasty dislocation, now with anatomic alignment. 2. Small joint effusion. Electronically Signed   By: Randa Ngo M.D.   On: 12/07/2020 17:52   CT MAXILLOFACIAL WO CONTRAST  Result Date: 12/12/2020 CLINICAL DATA:  Pain.  Fall from bed. EXAM: CT MAXILLOFACIAL WITHOUT CONTRAST TECHNIQUE: Multidetector CT imaging of the maxillofacial structures was performed. Multiplanar CT image reconstructions were also generated. COMPARISON:  Head MRI 12/04/2020.  Head and neck CTA 12/04/2020. FINDINGS: Osseous: No acute fracture or mandibular dislocation. Bilateral TMJ arthropathy with advanced joint space narrowing and condylar head flattening. Advanced C1-2 arthropathy and cervical disc degeneration. Orbits: Unremarkable. Sinuses: Paranasal sinuses and mastoid air cells are clear. Leftward nasal septal deviation. Soft tissues: Calcific atherosclerotic calcification at the left greater than right carotid bifurcations. Limited intracranial: Cerebral atrophy and advanced chronic small vessel ischemia in the cerebral white matter, more fully evaluated on the recent MRI. IMPRESSION: No acute maxillofacial fracture. Electronically Signed   By: Logan Bores M.D.   On: 12/12/2020 17:41     The results of significant diagnostics from this hospitalization (including imaging, microbiology, ancillary and laboratory) are listed below for reference.     Microbiology: Recent Results (from the past 240 hour(s))  MRSA Next Gen by PCR, Nasal     Status: None   Collection Time: 12/04/20  9:05 PM   Specimen: Nasal Mucosa; Nasal Swab  Result Value Ref Range Status    MRSA by PCR Next Gen NOT DETECTED NOT DETECTED  Final    Comment: (NOTE) The GeneXpert MRSA Assay (FDA approved for NASAL specimens only), is one component of a comprehensive MRSA colonization surveillance program. It is not intended to diagnose MRSA infection nor to guide or monitor treatment for MRSA infections. Test performance is not FDA approved in patients less than 4 years old. Performed at [facility], [place], [place] [address]   SARS CORONAVIRUS 2 (TAT 6-24 HRS) Nasopharyngeal Nasopharyngeal Swab     Status: None   Collection Time: 12/10/20 10:58 AM   Specimen: Nasopharyngeal Swab  Result Value Ref Range Status   SARS Coronavirus 2 NEGATIVE NEGATIVE Final    Comment: (NOTE) SARS-CoV-2 target nucleic acids are NOT DETECTED.  The SARS-CoV-2 RNA is generally detectable in upper and lower respiratory specimens during the acute phase of infection. Negative results do not preclude SARS-CoV-2 infection, do not rule out co-infections with other pathogens, and should not be used as the sole basis for treatment or other patient management decisions. Negative results must be combined with clinical observations, patient history, and epidemiological information. The expected result is Negative.  Fact Sheet for Patients: SugarRoll.be  Fact Sheet for Healthcare Providers: https://www.woods-mathews.com/  This test is not yet approved or cleared by the [place] FDA and  has been authorized for detection and/or diagnosis of SARS-CoV-2 by FDA under an Emergency Use Authorization (EUA). This EUA will remain  in effect (meaning this test can be used) for the duration of the COVID-19 declaration under Se ction 564(b)(1) of the Act, 21 U.S.C. section 360bbb-3(b)(1), unless the authorization is terminated or revoked sooner.  Performed at [facility], [place] [address] [place], [place] [address]       Labs: BNP (last 3 results) No results for input(s): BNP in the last 8760 hours. Basic Metabolic Panel: Recent Labs  Lab 12/07/20 0346 12/08/20 0419 12/09/20 0504 12/11/20 1239  NA 141 136  --  131*  K 3.4* 3.6  --  3.8  CL 108 106  --  99  CO2 20* 20*  --  21*  GLUCOSE 93 119*  --  103*  BUN 27* 18  --  13  CREATININE 0.58 0.55 0.44 0.50  CALCIUM 8.9 8.8*  --  8.9  MG 1.8  --   --   --    Liver Function Tests: No results for input(s): AST, ALT, ALKPHOS, BILITOT, PROT, ALBUMIN in the last 168 hours. No results for input(s): LIPASE, AMYLASE in the last 168 hours. No results for input(s): AMMONIA in the last 168 hours. CBC: Recent Labs  Lab 12/07/20 0346 12/08/20 0419 12/11/20 1239  WBC 10.2 9.7 10.6*  HGB 11.0* 12.5 12.9  HCT 32.7* 38.1 38.1  MCV 88.9 88.0 85.6  PLT 262 248 340   Cardiac Enzymes: No results for input(s): CKTOTAL, CKMB, CKMBINDEX, TROPONINI in the last 168 hours. BNP: Invalid input(s): POCBNP CBG: Recent Labs  Lab 12/07/20 1544  GLUCAP 106*   D-Dimer No results for input(s): DDIMER in the last 72 hours. Hgb A1c No results for input(s): HGBA1C in the last 72 hours. Lipid Profile No results for input(s): CHOL, HDL, LDLCALC, TRIG, CHOLHDL, LDLDIRECT in the last 72 hours. Thyroid function studies No results for input(s): TSH, T4TOTAL, T3FREE, THYROIDAB in the last 72 hours.  Invalid input(s): FREET3 Anemia work up No results for input(s): VITAMINB12, FOLATE, FERRITIN, TIBC, IRON, RETICCTPCT in the last 72 hours. Urinalysis    Component Value Date/Time   COLORURINE AMBER BIOCHEMICALS  MAY BE AFFECTED BY COLOR (A) 06/25/2008 1242   APPEARANCEUR Cloudy (A) 08/31/2020 1039   LABSPEC 1.023 06/25/2008 1242   PHURINE 6.0 06/25/2008 1242   GLUCOSEU Negative 08/31/2020 1039   HGBUR NEGATIVE 06/25/2008 1242   BILIRUBINUR Negative 08/31/2020 1039   KETONESUR >80 (A) 06/25/2008 1242   PROTEINUR 1+ (A) 08/31/2020 1039   PROTEINUR NEGATIVE  06/25/2008 1242   UROBILINOGEN 0.2 06/25/2008 1242   NITRITE Positive (A) 08/31/2020 1039   NITRITE NEGATIVE 06/25/2008 1242   LEUKOCYTESUR 2+ (A) 08/31/2020 1039   Sepsis Labs Invalid input(s): PROCALCITONIN,  WBC,  LACTICIDVEN Microbiology Recent Results (from the past 240 hour(s))  MRSA Next Gen by PCR, Nasal     Status: None   Collection Time: 12/04/20  9:05 PM   Specimen: Nasal Mucosa; Nasal Swab  Result Value Ref Range Status   MRSA by PCR Next Gen NOT DETECTED NOT DETECTED Final    Comment: (NOTE) The GeneXpert MRSA Assay (FDA approved for NASAL specimens only), is one component of a comprehensive MRSA colonization surveillance program. It is not intended to diagnose MRSA infection nor to guide or monitor treatment for MRSA infections. Test performance is not FDA approved in patients less than 34 years old. Performed at [facility], [place], [place] [address]   SARS CORONAVIRUS 2 (TAT 6-24 HRS) Nasopharyngeal Nasopharyngeal Swab     Status: None   Collection Time: 12/10/20 10:58 AM   Specimen: Nasopharyngeal Swab  Result Value Ref Range Status   SARS Coronavirus 2 NEGATIVE NEGATIVE Final    Comment: (NOTE) SARS-CoV-2 target nucleic acids are NOT DETECTED.  The SARS-CoV-2 RNA is generally detectable in upper and lower respiratory specimens during the acute phase of infection. Negative results do not preclude SARS-CoV-2 infection, do not rule out co-infections with other pathogens, and should not be used as the sole basis for treatment or other patient management decisions. Negative results must be combined with clinical observations, patient history, and epidemiological information. The expected result is Negative.  Fact Sheet for Patients: SugarRoll.be  Fact Sheet for Healthcare Providers: https://www.woods-mathews.com/  This test is not yet approved or cleared by the [place] FDA and  has  been authorized for detection and/or diagnosis of SARS-CoV-2 by FDA under an Emergency Use Authorization (EUA). This EUA will remain  in effect (meaning this test can be used) for the duration of the COVID-19 declaration under Se ction 564(b)(1) of the Act, 21 U.S.C. section 360bbb-3(b)(1), unless the authorization is terminated or revoked sooner.  Performed at [facility], [place] [address], [place], [place] [address]      Time coordinating discharge in minutes: 65  SIGNED:   Debbe Odea, MD  [facility] 12/13/2020, 9:33 AM

## 2020-12-13 NOTE — NC FL2 (Signed)
[place] LEVEL OF CARE SCREENING TOOL     IDENTIFICATION  Patient Name: [name] Birthdate: Feb 09, 1941 Sex: female Admission Date (Current Location): 12/02/2020  [facility] and [place] Number:  Engineering geologist and Address:  [facility], [address] [place], [place] [address]      Provider Number: [id number]  Attending Physician Name and Address:  Debbe Odea, MD  Relative Name and Phone Number:       Current Level of Care: Hospital Recommended Level of Care: [place] Prior Approval Number:    Date Approved/Denied:   PASRR Number: [id number]  Discharge Plan: SNF    Current Diagnoses: Patient Active Problem List   Diagnosis Date Noted   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[facility]) 12/10/2020   Knee dislocation, right, initial encounter 12/10/2020   HLD (hyperlipidemia) 12/05/2020   Depression with anxiety 12/05/2020   Coma ([place]) 12/05/2020   Acute encephalopathy    Strangulated inguinal hernia 12/02/2020   Anxiety 11/25/2020   Aortic valve stenosis 09/09/2020   Prediabetes 05/17/2020   Hypertension 05/10/2020   Age-related osteoporosis without current pathological fracture 01/03/2016   Pure hypercholesterolemia 01/03/2016   Elevated TSH 01/03/2016   Primary osteoarthritis of both knees 12/06/2015   Vitamin D deficiency 06/16/2014   Personal history of other malignant neoplasm of skin 02/19/2014   Prolapse of vaginal vault 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 08/14/2013   Patient on low glycemic diet 08/02/2012   Knee pain 08/02/2012   Gynecological examination 06/20/2011   Neuropathy 06/20/2011   Uterine prolapse 06/20/2011   Anemia 06/18/2009   HYPOKALEMIA, MILD 06/25/2008   INTESTINAL OBSTRUCTION, HX OF 12/11/2007   BLEEDING, POSTMENOPAUSAL 10/23/2006   PERSONALITY DISORDER 10/22/2006   TREMOR, ESSENTIAL 10/22/2006   TRANSAMINASES, SERUM, ELEVATED 10/22/2006    Orientation RESPIRATION BLADDER Height  & Weight     Self, Place  Normal Incontinent Weight: 86 kg (bed weight) Height:  5\' 3"  (160 cm)  BEHAVIORAL SYMPTOMS/MOOD NEUROLOGICAL BOWEL NUTRITION STATUS   (None)  (None) Continent Diet (Mechanical Soft regular diet)  AMBULATORY STATUS COMMUNICATION OF NEEDS Skin   Extensive Assist Verbally Bruising, Other (Comment), Surgical wounds (Blister, cracking, rash. Incision right groin (honeycomb) and right knee (no dressing).)                       Personal Care Assistance Level of Assistance  Bathing, Feeding, Dressing Bathing Assistance: Maximum assistance Feeding assistance: Limited assistance Dressing Assistance: Maximum assistance     Functional Limitations Info  Sight, Hearing, Speech Sight Info: Adequate Hearing Info: Adequate Speech Info: Adequate (Delayed responses)    SPECIAL CARE FACTORS FREQUENCY  PT (By licensed PT), OT (By licensed OT), Speech therapy     PT Frequency: 5 x week OT Frequency: 5 x week     Speech Therapy Frequency: twice per week      Contractures Contractures Info: Not present    Additional Factors Info  Code Status, Allergies, Psychotropic Code Status Info: DNR Allergies Info: Penicillins Psychotropic Info: Anxiety, depression         Current Medications (12/13/2020):  This is the current hospital active medication list Current Facility-Administered Medications  Medication Dose Route Frequency Provider Last Rate Last Admin   0.9 %  sodium chloride infusion   Intravenous PRN Lovell Sheehan, MD   Stopped at 12/09/20 1357   acetaminophen (TYLENOL) suppository 650 mg  650 mg Rectal Q6H PRN Lovell Sheehan, MD       acetaminophen (  TYLENOL) tablet 650 mg  650 mg Oral Q6H PRN Lovell Sheehan, MD   650 mg at 12/12/20 1506   amLODipine (NORVASC) tablet 10 mg  10 mg Oral QHS Debbe Odea, MD   10 mg at 12/12/20 2032   aspirin chewable tablet 81 mg  81 mg Oral Daily Rizwan, Eunice Blase, MD   81 mg at 12/13/20 0937   carvedilol (COREG) tablet  6.25 mg  6.25 mg Oral BID WC Rizwan, Eunice Blase, MD   6.25 mg at 12/13/20 0936   dextrose 50 % solution 50 mL  50 mL Intravenous PRN Lovell Sheehan, MD       enoxaparin (LOVENOX) injection 40 mg  40 mg Subcutaneous Q24H Lovell Sheehan, MD   40 mg at 12/13/20 1224   feeding supplement (ENSURE ENLIVE / ENSURE PLUS) liquid 237 mL  237 mL Oral BID BM Rizwan, Eunice Blase, MD   237 mL at 12/12/20 1132   hydrALAZINE (APRESOLINE) injection 5 mg  5 mg Intravenous Q2H PRN Lovell Sheehan, MD   5 mg at 12/11/20 0024   HYDROcodone-acetaminophen (NORCO/VICODIN) 5-325 MG per tablet 1-2 tablet  1-2 tablet Oral Q4H PRN Lovell Sheehan, MD   1 tablet at 12/13/20 0036   lisinopril (ZESTRIL) tablet 40 mg  40 mg Oral Daily Debbe Odea, MD   40 mg at 12/13/20 0935   MEDLINE mouth rinse  15 mL Mouth Rinse BID Lovell Sheehan, MD   15 mL at 12/13/20 0938   ondansetron (ZOFRAN-ODT) disintegrating tablet 4 mg  4 mg Oral Q6H PRN Lovell Sheehan, MD       Or   ondansetron [facility]) injection 4 mg  4 mg Intravenous Q6H PRN Lovell Sheehan, MD       rosuvastatin (CRESTOR) tablet 10 mg  10 mg Oral Daily Debbe Odea, MD   10 mg at 12/13/20 8250   sertraline (ZOLOFT) tablet 25 mg  25 mg Oral Daily Lovell Sheehan, MD   25 mg at 12/13/20 0370   traMADol (ULTRAM) tablet 50 mg  50 mg Oral Q6H PRN Lovell Sheehan, MD   50 mg at 12/07/20 0005     Discharge Medications: Please see discharge summary for a list of discharge medications.  Relevant Imaging Results:  Relevant Lab Results:   Additional Information SS#: [id number]. COVID vaccines: 09/11/19 (Johnson&Johnson), 01/31/20 (Moderna). May have had second booster based on chart review.  Shelbie Hutching, RN

## 2020-12-13 NOTE — Progress Notes (Addendum)
Speech Language Pathology Treatment: Dysphagia  Patient Details Name: [name] MRN: [id number] DOB: 03-Apr-1940 Today's Date: 12/13/2020 Time: 1240-1330 SLP Time Calculation (min) (ACUTE ONLY): 50 min  Assessment / Plan / Recommendation Clinical Impression  Pt seen for ongoing assessment of swallowing. She was alert, verbally responsive and engaged in conversation w/ Daughters and SLP; min slow responses. Pt is on RA; afebrile. Dentures in place. Due to Left sided weakness noted; MRI performed during this admit showing: "Patchy acute cortical and subcortical watershed type ischemic infarcts involving the bilateral cerebral hemispheres, right worse than left. No associated hemorrhage or mass effect.   2. Underlying moderately advanced chronic microvascular ischemic disease.". Pt exhibits LUE weakness and LEFT NEGLECT. She is able to discern at Midline. She requires MOD cues for follow through w/ all tasks.  Pt and Daughters explained general aspiration precautions and agreed verbally to the need for following them especially sitting upright for all oral intake -- supported fully behind the back w/ pillows more for full upright sitting. Pt assisted w/ positioning d/t min weakness and decreased attention. She was then assisted in feeding herself sips of thin liquids via Cup, then trials of mech soft foods. NO overt clinical s/s of aspiration were noted w/ any consistency; respiratory status remained calm and unlabored, vocal quality clear b/t trials. Oral phase appeared grossly [facility] for bolus management and timely A-P transfer for swallowing; oral clearing achieved w/ all consistencies w/ Time. Min increased mastication time w/ soft solids -- pt is Unable to cut foods as well d/t LUE weakness s/p stroke. Family present denied any deficits in swallowing as well w/ the current diet.   Pt appears at reduced risk for aspriation when following general aspiration precautions. Recommend upgrade to a  more Mech soft diet for ease of soft foods w/ gravies added to moisten foods(less cutting effort d/t LUE weakness); Thin liquids. Monitor need for Cup drinking only vs use of straw for best oral control.  Recommend general aspiration precautions; Pills Whole in Puree; tray setup and positioning Upright assistance for meals. Feeding Support d/t LEFT NEGLECT, LUE weakness. ST services will monitor pt's status while admitted. OT f/u recommended d/t LUE weakness. Dietician f/u for support. NSG updated. Precautions posted at bedside.     HPI HPI: Pt is a 80 y.o. femalewho presents to the ED for evaluation of abd pain.  Chart review indicates history of HTN, HLD, iron deficiency anemia, schizoaffective Psychiatric disorder; SBO prior.  Patient presents to the ED from home for evaluation of 3 days of abdominal pain, emesis and constipation.  She was at home alone, ambulatory independently with a cane or walker. Reports 3 days of progressive generalized abdominal pain with nausea and occasional nonbloody nonbilious emesis.  Reports primarily left upper quadrant abdominal pain, 6-10 intensity, nonradiating aching.  Reports no dysuria or urinary changes.  Denies diarrhea, but reports constipation with no bowel movements for 3 to 4 days. Is not passing gas.  Surgery is following pt.   Imaging: Right paratracheal soft tissue opacity, not well characterized,  possibly artifactual and related to rotation. This would be better assessed with a standard two view chest radiograph or CT examination  when the patient is clinically able to do so. Pulmonary hypoinflation.  MRI: Patchy acute cortical and subcortical watershed type ischemic  infarcts involving the bilateral cerebral hemispheres, right worse  than left. No associated hemorrhage or mass effect.  2. Underlying moderately advanced chronic microvascular ischemic disease.  SLP Plan  Continue with current plan of care      Recommendations for follow up therapy are  one component of a multi-disciplinary discharge planning process, led by the attending physician.  Recommendations may be updated based on patient status, additional functional criteria and insurance authorization.    Recommendations  Diet recommendations: Dysphagia 3 (mechanical soft);Thin liquid Liquids provided via: Cup Medication Administration: Whole meds with puree (for safer swallowing) Supervision: Patient able to self feed;Staff to assist with self feeding;Intermittent supervision to cue for compensatory strategies Compensations: Minimize environmental distractions;Slow rate;Small sips/bites;Lingual sweep for clearance of pocketing;Follow solids with liquid Postural Changes and/or Swallow Maneuvers: Out of bed for meals;Seated upright 90 degrees;Upright 30-60 min after meal                General recommendations:  (Dietician f/u; PT/OT) Oral Care Recommendations: Oral care BID;Oral care before and after PO;Staff/trained caregiver to provide oral care (denture care) Follow up Recommendations: Skilled Nursing facility SLP Visit Diagnosis: Dysphagia, oropharyngeal phase (R13.12) Plan: Continue with current plan of care       GO                Orinda Kenner, [place], [place] Pathologist Rehab Services [phone] [facility]  12/13/2020, 2:43 PM

## 2020-12-13 NOTE — TOC Transition Note (Signed)
Transition of Care [facility]) - CM/SW Discharge Note   Patient Details  Name: [name] MRN: [id number] Date of Birth: 1940/06/11  [facility] CM/SW Contact:  Shelbie Hutching, RN Phone Number: 12/13/2020, 1:13 PM   Clinical Narrative:    Patient medically cleared for discharge to [facility] in [place] today.  Patient going to room 703P, bedside RN will call report.  RNCM has arranged EMS transport for around 1330.  Daughter at the bedside and updated on discharge plan.     Final next level of care: Skilled Nursing Facility Barriers to Discharge: Barriers Resolved   Patient Goals and CMS Choice Patient states their goals for this hospitalization and ongoing recovery are:: Agree to SNF at [facility] Medicare.gov Compare Post Acute Care list provided to:: Patient Represenative (must comment) Choice offered to / list presented to : Adult Children  Discharge Placement PASRR number recieved: 12/08/20            Patient [facility] bed at: [facility] Patient to be transferred to facility by: [facility] Name of family member notified: Caryl Pina - daughter Patient and family notified of of transfer: 12/13/20  Discharge Plan and Services     Post Acute Care Choice: [place]          DME Arranged: N/A DME Agency: NA       HH Arranged: NA          Social Determinants of Health (SDOH) Interventions     Readmission Risk Interventions No flowsheet data found.

## 2020-12-13 NOTE — Progress Notes (Signed)
Nutrition Follow-up  DOCUMENTATION CODES:  Not applicable  INTERVENTION:  Continue current diet as ordered per SLP, encourage PO intake. Advance as able to upgraded consistency Nursing to assist with feeding if family is not present to help. Nepro Shake po BID, each supplement provides 425 kcal and 19 grams protein MVI with minerals daily Magic cup TID with meals, each supplement provides 290 kcal and 9 grams of protein  NUTRITION DIAGNOSIS:  Inadequate oral intake related to dysphagia, decreased appetite as evidenced by per patient/family report, meal completion < 50%.  GOAL:  Patient will meet greater than or equal to 90% of their needs  MONITOR:  PO intake, Supplement acceptance  REASON FOR ASSESSMENT:  Consult Enteral/tube feeding initiation and management  ASSESSMENT:  80 y.o. female with hx of HLD, osteoarthritis, schizoaffective disorder, HTN, and vitamin d deficiency  presented to ED from home with 3 days of progressive generalized abdominal pain with nausea and occasional nonbloody nonbilious emesis. Imaging in ED showed an incarcerated hernia causing SBO  Surgery was consulted and recommended emergent repair due to likelihood of strangulated bowel. Taken for open right femoral hernia repair with mesh, small bowel resection 9/22.  Code Stroke was called for pt 9/24 after a change in mental status occurred. Determined to have had an acute stroke. Remained minimally interactive but was able to have diet advanced 9/27. NGT initially in place but was removed by surgery team 9/27.  Reviewed intake since last assessment. Appears to remains fair/poor at most meals. Pt accepting some ensures, but not BID. Will request new weight to assess for changes since last assessment.   Pt will likely be dc to a facility today.  Average Meal Intake: 9/23-9/28: 100% intake x 1 recorded meals 9/29-10/3: 39% intake x 9 recorded meals  Nutritionally Relevant Medications: Scheduled Meds:   feeding supplement  237 mL Oral BID BM   rosuvastatin  10 mg Oral Daily   PRN Meds: ondansetron  Labs Reviewed  NUTRITION - FOCUSED PHYSICAL EXAM: Flowsheet Row Most Recent Value  Orbital Region No depletion  Upper Arm Region No depletion  Thoracic and Lumbar Region No depletion  Buccal Region No depletion  Temple Region Mild depletion  Clavicle Bone Region No depletion  Clavicle and Acromion Bone Region No depletion  Scapular Bone Region No depletion  Dorsal Hand No depletion  Patellar Region No depletion  Anterior Thigh Region No depletion  Posterior Calf Region Mild depletion  Edema (RD Assessment) Mild  Hair Reviewed  Eyes Reviewed  Mouth Reviewed  Skin Reviewed  Nails Reviewed   Diet Order:   Diet Order             Diet - low sodium heart healthy           DIET - DYS 1 Room service appropriate? Yes; Fluid consistency: Thin  Diet effective now                  EDUCATION NEEDS:  Education needs have been addressed  Skin:  Skin Assessment: Reviewed RN Assessment  Last BM:  10/1  Height:  Ht Readings from Last 1 Encounters:  12/04/20 5\' 3"  (1.6 m)    Weight:  Wt Readings from Last 1 Encounters:  12/08/20 86 kg    Ideal Body Weight:  52.3 kg  BMI:  Body mass index is 33.59 kg/m.  Estimated Nutritional Needs:  Kcal:  1500-1700 kcal/d Protein:  75-85 g/d Fluid:  1.5-1.8L/d  Ranell Patrick, RD, LDN Clinical Dietitian Pager on  Amion

## 2020-12-14 DIAGNOSIS — R0989 Other specified symptoms and signs involving the circulatory and respiratory systems: Secondary | ICD-10-CM | POA: Diagnosis not present

## 2020-12-14 DIAGNOSIS — F22 Delusional disorders: Secondary | ICD-10-CM | POA: Diagnosis not present

## 2020-12-14 DIAGNOSIS — K4030 Unilateral inguinal hernia, with obstruction, without gangrene, not specified as recurrent: Secondary | ICD-10-CM | POA: Diagnosis not present

## 2020-12-14 DIAGNOSIS — I639 Cerebral infarction, unspecified: Secondary | ICD-10-CM | POA: Diagnosis not present

## 2020-12-14 DIAGNOSIS — R197 Diarrhea, unspecified: Secondary | ICD-10-CM | POA: Diagnosis not present

## 2020-12-14 DIAGNOSIS — S83104A Unspecified dislocation of right knee, initial encounter: Secondary | ICD-10-CM | POA: Diagnosis not present

## 2020-12-14 DIAGNOSIS — E785 Hyperlipidemia, unspecified: Secondary | ICD-10-CM | POA: Diagnosis not present

## 2020-12-14 DIAGNOSIS — E871 Hypo-osmolality and hyponatremia: Secondary | ICD-10-CM | POA: Diagnosis not present

## 2020-12-14 DIAGNOSIS — I720 Aneurysm of carotid artery: Secondary | ICD-10-CM | POA: Diagnosis not present

## 2020-12-20 DIAGNOSIS — S83104D Unspecified dislocation of right knee, subsequent encounter: Secondary | ICD-10-CM | POA: Diagnosis not present

## 2020-12-20 DIAGNOSIS — W19XXXA Unspecified fall, initial encounter: Secondary | ICD-10-CM | POA: Diagnosis not present

## 2020-12-20 DIAGNOSIS — K130 Diseases of lips: Secondary | ICD-10-CM | POA: Diagnosis not present

## 2020-12-20 DIAGNOSIS — S4992XA Unspecified injury of left shoulder and upper arm, initial encounter: Secondary | ICD-10-CM | POA: Diagnosis not present

## 2020-12-21 DIAGNOSIS — S83104D Unspecified dislocation of right knee, subsequent encounter: Secondary | ICD-10-CM | POA: Diagnosis not present

## 2020-12-21 DIAGNOSIS — E871 Hypo-osmolality and hyponatremia: Secondary | ICD-10-CM | POA: Diagnosis not present

## 2020-12-21 DIAGNOSIS — S4992XA Unspecified injury of left shoulder and upper arm, initial encounter: Secondary | ICD-10-CM | POA: Diagnosis not present

## 2020-12-21 LAB — BLOOD GAS, ARTERIAL
Acid-Base Excess: 1.8 mmol/L (ref 0.0–2.0)
Bicarbonate: 25.7 mmol/L (ref 20.0–28.0)
FIO2: 0.35
O2 Saturation: 96.6 %
Patient temperature: 37
pCO2 arterial: 37 mmHg (ref 32.0–48.0)
pH, Arterial: 7.45 (ref 7.350–7.450)
pO2, Arterial: 83 mmHg (ref 83.0–108.0)

## 2020-12-22 ENCOUNTER — Encounter (HOSPITAL_COMMUNITY): Payer: Self-pay | Admitting: Emergency Medicine

## 2020-12-22 ENCOUNTER — Inpatient Hospital Stay
Admission: AD | Admit: 2020-12-22 | Payer: Medicare PPO | Source: Other Acute Inpatient Hospital | Admitting: Internal Medicine

## 2020-12-22 ENCOUNTER — Emergency Department (HOSPITAL_COMMUNITY): Payer: Medicare PPO

## 2020-12-22 ENCOUNTER — Inpatient Hospital Stay
Admission: RE | Admit: 2020-12-22 | Discharge: 2020-12-27 | DRG: 467 | Disposition: A | Discharge status: Skilled Nursing Facility | Payer: Medicare PPO | Source: Skilled Nursing Facility | Attending: Obstetrics and Gynecology | Admitting: Obstetrics and Gynecology

## 2020-12-22 ENCOUNTER — Inpatient Hospital Stay (HOSPITAL_COMMUNITY): Payer: Medicare PPO

## 2020-12-22 ENCOUNTER — Emergency Department (HOSPITAL_COMMUNITY)
Admission: EM | Admit: 2020-12-22 | Discharge: 2020-12-22 | Disposition: A | Discharge status: Other Acute Inpatient Hospital | Payer: Medicare PPO | Attending: Emergency Medicine | Admitting: Emergency Medicine

## 2020-12-22 DIAGNOSIS — R5381 Other malaise: Secondary | ICD-10-CM | POA: Diagnosis not present

## 2020-12-22 DIAGNOSIS — Z87891 Personal history of nicotine dependence: Secondary | ICD-10-CM

## 2020-12-22 DIAGNOSIS — Z20822 Contact with and (suspected) exposure to covid-19: Secondary | ICD-10-CM | POA: Diagnosis present

## 2020-12-22 DIAGNOSIS — Z808 Family history of malignant neoplasm of other organs or systems: Secondary | ICD-10-CM | POA: Diagnosis not present

## 2020-12-22 DIAGNOSIS — S83124A Posterior dislocation of proximal end of tibia, right knee, initial encounter: Secondary | ICD-10-CM | POA: Insufficient documentation

## 2020-12-22 DIAGNOSIS — N390 Urinary tract infection, site not specified: Secondary | ICD-10-CM | POA: Diagnosis not present

## 2020-12-22 DIAGNOSIS — S83106A Unspecified dislocation of unspecified knee, initial encounter: Secondary | ICD-10-CM | POA: Diagnosis present

## 2020-12-22 DIAGNOSIS — Z9049 Acquired absence of other specified parts of digestive tract: Secondary | ICD-10-CM | POA: Diagnosis not present

## 2020-12-22 DIAGNOSIS — S83104A Unspecified dislocation of right knee, initial encounter: Secondary | ICD-10-CM | POA: Diagnosis present

## 2020-12-22 DIAGNOSIS — F419 Anxiety disorder, unspecified: Secondary | ICD-10-CM | POA: Diagnosis present

## 2020-12-22 DIAGNOSIS — Z8349 Family history of other endocrine, nutritional and metabolic diseases: Secondary | ICD-10-CM

## 2020-12-22 DIAGNOSIS — Y92122 Bedroom in nursing home as the place of occurrence of the external cause: Secondary | ICD-10-CM

## 2020-12-22 DIAGNOSIS — M25532 Pain in left wrist: Secondary | ICD-10-CM | POA: Diagnosis not present

## 2020-12-22 DIAGNOSIS — S0993XA Unspecified injury of face, initial encounter: Secondary | ICD-10-CM | POA: Diagnosis not present

## 2020-12-22 DIAGNOSIS — Z79899 Other long term (current) drug therapy: Secondary | ICD-10-CM

## 2020-12-22 DIAGNOSIS — Z66 Do not resuscitate: Secondary | ICD-10-CM | POA: Diagnosis present

## 2020-12-22 DIAGNOSIS — Z8673 Personal history of transient ischemic attack (TIA), and cerebral infarction without residual deficits: Secondary | ICD-10-CM

## 2020-12-22 DIAGNOSIS — S82001A Unspecified fracture of right patella, initial encounter for closed fracture: Secondary | ICD-10-CM | POA: Diagnosis not present

## 2020-12-22 DIAGNOSIS — Z8261 Family history of arthritis: Secondary | ICD-10-CM | POA: Diagnosis not present

## 2020-12-22 DIAGNOSIS — Z880 Allergy status to penicillin: Secondary | ICD-10-CM | POA: Diagnosis not present

## 2020-12-22 DIAGNOSIS — Z85828 Personal history of other malignant neoplasm of skin: Secondary | ICD-10-CM | POA: Insufficient documentation

## 2020-12-22 DIAGNOSIS — E785 Hyperlipidemia, unspecified: Secondary | ICD-10-CM | POA: Diagnosis not present

## 2020-12-22 DIAGNOSIS — W06XXXA Fall from bed, initial encounter: Secondary | ICD-10-CM | POA: Diagnosis present

## 2020-12-22 DIAGNOSIS — Z96652 Presence of left artificial knee joint: Secondary | ICD-10-CM

## 2020-12-22 DIAGNOSIS — T84022A Instability of internal right knee prosthesis, initial encounter: Secondary | ICD-10-CM | POA: Diagnosis not present

## 2020-12-22 DIAGNOSIS — L89151 Pressure ulcer of sacral region, stage 1: Secondary | ICD-10-CM | POA: Diagnosis present

## 2020-12-22 DIAGNOSIS — Z833 Family history of diabetes mellitus: Secondary | ICD-10-CM

## 2020-12-22 DIAGNOSIS — M25561 Pain in right knee: Secondary | ICD-10-CM | POA: Diagnosis not present

## 2020-12-22 DIAGNOSIS — Z96651 Presence of right artificial knee joint: Secondary | ICD-10-CM | POA: Diagnosis not present

## 2020-12-22 DIAGNOSIS — S80919A Unspecified superficial injury of unspecified knee, initial encounter: Secondary | ICD-10-CM | POA: Diagnosis not present

## 2020-12-22 DIAGNOSIS — Z8719 Personal history of other diseases of the digestive system: Secondary | ICD-10-CM

## 2020-12-22 DIAGNOSIS — R9082 White matter disease, unspecified: Secondary | ICD-10-CM | POA: Diagnosis not present

## 2020-12-22 DIAGNOSIS — M542 Cervicalgia: Secondary | ICD-10-CM | POA: Insufficient documentation

## 2020-12-22 DIAGNOSIS — M6281 Muscle weakness (generalized): Secondary | ICD-10-CM | POA: Diagnosis not present

## 2020-12-22 DIAGNOSIS — Z23 Encounter for immunization: Secondary | ICD-10-CM | POA: Diagnosis not present

## 2020-12-22 DIAGNOSIS — F329 Major depressive disorder, single episode, unspecified: Secondary | ICD-10-CM | POA: Diagnosis not present

## 2020-12-22 DIAGNOSIS — Y792 Prosthetic and other implants, materials and accessory orthopedic devices associated with adverse incidents: Secondary | ICD-10-CM | POA: Diagnosis present

## 2020-12-22 DIAGNOSIS — S83521A Sprain of posterior cruciate ligament of right knee, initial encounter: Secondary | ICD-10-CM | POA: Diagnosis present

## 2020-12-22 DIAGNOSIS — Z8249 Family history of ischemic heart disease and other diseases of the circulatory system: Secondary | ICD-10-CM

## 2020-12-22 DIAGNOSIS — Z7982 Long term (current) use of aspirin: Secondary | ICD-10-CM | POA: Insufficient documentation

## 2020-12-22 DIAGNOSIS — B9689 Other specified bacterial agents as the cause of diseases classified elsewhere: Secondary | ICD-10-CM | POA: Insufficient documentation

## 2020-12-22 DIAGNOSIS — F259 Schizoaffective disorder, unspecified: Secondary | ICD-10-CM | POA: Diagnosis present

## 2020-12-22 DIAGNOSIS — S0990XA Unspecified injury of head, initial encounter: Secondary | ICD-10-CM | POA: Diagnosis not present

## 2020-12-22 DIAGNOSIS — I10 Essential (primary) hypertension: Secondary | ICD-10-CM | POA: Diagnosis present

## 2020-12-22 DIAGNOSIS — M25461 Effusion, right knee: Secondary | ICD-10-CM | POA: Diagnosis not present

## 2020-12-22 DIAGNOSIS — R52 Pain, unspecified: Secondary | ICD-10-CM

## 2020-12-22 DIAGNOSIS — W19XXXA Unspecified fall, initial encounter: Secondary | ICD-10-CM

## 2020-12-22 DIAGNOSIS — F418 Other specified anxiety disorders: Secondary | ICD-10-CM | POA: Diagnosis present

## 2020-12-22 DIAGNOSIS — S8991XA Unspecified injury of right lower leg, initial encounter: Secondary | ICD-10-CM | POA: Diagnosis present

## 2020-12-22 DIAGNOSIS — Z043 Encounter for examination and observation following other accident: Secondary | ICD-10-CM | POA: Diagnosis not present

## 2020-12-22 DIAGNOSIS — M7989 Other specified soft tissue disorders: Secondary | ICD-10-CM | POA: Diagnosis not present

## 2020-12-22 DIAGNOSIS — Y900 Blood alcohol level of less than 20 mg/100 ml: Secondary | ICD-10-CM | POA: Diagnosis not present

## 2020-12-22 DIAGNOSIS — L8990 Pressure ulcer of unspecified site, unspecified stage: Secondary | ICD-10-CM | POA: Insufficient documentation

## 2020-12-22 DIAGNOSIS — F32A Depression, unspecified: Secondary | ICD-10-CM | POA: Diagnosis not present

## 2020-12-22 DIAGNOSIS — M24461 Recurrent dislocation, right knee: Secondary | ICD-10-CM | POA: Diagnosis not present

## 2020-12-22 DIAGNOSIS — R279 Unspecified lack of coordination: Secondary | ICD-10-CM | POA: Diagnosis not present

## 2020-12-22 LAB — URINALYSIS, ROUTINE W REFLEX MICROSCOPIC
Bilirubin Urine: NEGATIVE
Glucose, UA: NEGATIVE mg/dL
Ketones, ur: NEGATIVE mg/dL
Nitrite: POSITIVE — AB
Protein, ur: NEGATIVE mg/dL
Specific Gravity, Urine: 1.014 (ref 1.005–1.030)
WBC, UA: >50 WBC/hpf — ABNORMAL HIGH (ref 0–5)
pH: 6 (ref 5.0–8.0)

## 2020-12-22 LAB — COMPREHENSIVE METABOLIC PANEL
ALT: 14 U/L (ref 0–44)
AST: 20 U/L (ref 15–41)
Albumin: 3.3 g/dL — ABNORMAL LOW (ref 3.5–5.0)
Alkaline Phosphatase: 59 U/L (ref 38–126)
Anion gap: 11 (ref 5–15)
BUN: 17 mg/dL (ref 8–23)
CO2: 20 mmol/L — ABNORMAL LOW (ref 22–32)
Calcium: 9.4 mg/dL (ref 8.9–10.3)
Chloride: 103 mmol/L (ref 98–111)
Creatinine, Ser: 0.57 mg/dL (ref 0.44–1.00)
GFR, Estimated: >60 mL/min (ref >60)
Glucose, Bld: 95 mg/dL (ref 70–99)
Potassium: 4 mmol/L (ref 3.5–5.1)
Sodium: 134 mmol/L — ABNORMAL LOW (ref 135–145)
Total Bilirubin: 0.7 mg/dL (ref 0.3–1.2)
Total Protein: 6.1 g/dL — ABNORMAL LOW (ref 6.5–8.1)

## 2020-12-22 LAB — CBC
HCT: 34.6 % — ABNORMAL LOW (ref 36.0–46.0)
Hemoglobin: 11.3 g/dL — ABNORMAL LOW (ref 12.0–15.0)
MCH: 29.2 pg (ref 26.0–34.0)
MCHC: 32.7 g/dL (ref 30.0–36.0)
MCV: 89.4 fL (ref 80.0–100.0)
Platelets: 395 10*3/uL (ref 150–400)
RBC: 3.87 MIL/uL (ref 3.87–5.11)
RDW: 14 % (ref 11.5–15.5)
WBC: 8.3 10*3/uL (ref 4.0–10.5)
nRBC: 0 % (ref 0.0–0.2)

## 2020-12-22 LAB — RESP PANEL BY RT-PCR (FLU A&B, COVID) ARPGX2
Influenza A by PCR: NEGATIVE
Influenza B by PCR: NEGATIVE
SARS Coronavirus 2 by RT PCR: NEGATIVE

## 2020-12-22 MED ORDER — FENTANYL CITRATE (PF) 100 MCG/2ML IJ SOLN
INTRAMUSCULAR | Status: AC | PRN
  Administered 2020-12-22: 75 ug via INTRAVENOUS
  Administered 2020-12-22: 25 ug via INTRAVENOUS

## 2020-12-22 MED ORDER — SODIUM CHLORIDE 0.9 % IV SOLN
1.0000 g | Freq: Once | INTRAVENOUS | Status: AC
  Administered 2020-12-22: 1 g via INTRAVENOUS
  Filled 2020-12-22: qty 10

## 2020-12-22 MED ORDER — MIDAZOLAM HCL 2 MG/2ML IJ SOLN
INTRAMUSCULAR | Status: AC | PRN
  Administered 2020-12-22: 2 mg via INTRAVENOUS

## 2020-12-22 MED ORDER — MIDAZOLAM HCL 2 MG/2ML IJ SOLN
4.0000 mg | Freq: Once | INTRAMUSCULAR | Status: AC
  Administered 2020-12-22: 4 mg via INTRAVENOUS
  Filled 2020-12-22: qty 4

## 2020-12-22 MED ORDER — FENTANYL CITRATE PF 50 MCG/ML IJ SOSY
50.0000 ug | PREFILLED_SYRINGE | Freq: Once | INTRAMUSCULAR | Status: AC
  Administered 2020-12-22: 50 ug via INTRAVENOUS
  Filled 2020-12-22: qty 1

## 2020-12-22 NOTE — ED Notes (Signed)
[facility] called by Johnika Escareno  they are in route

## 2020-12-22 NOTE — ED Notes (Signed)
Patient transported to CT 

## 2020-12-22 NOTE — ED Triage Notes (Signed)
Pt arrives via EMS from [facility] with reports of a fall this weekend and right knee injury. Pt had xray done that showed dislocation of the tibia and had knee immobilizer placed.

## 2020-12-22 NOTE — ED Provider Notes (Signed)
Care of the patient assumed at signout from Dr. Jeanell Sparrow.  With concern for unstable knee dislocation I discussed her case first with our orthopedic team, and Dr. Doran Durand.  As the patient's surgeon is at [facility] I subsequently discussed her case with her surgeon Dr. Harlow Mares.  Per request patient will be transferred to [facility] regional for anticipated revision of her knee arthroplasty.  To facilitate this I discussed her case with our hospitalist Dr. Marlowe Sax, accepts the patient in transfer for admission.   Carmin Muskrat, MD 12/22/20 2028

## 2020-12-22 NOTE — ED Provider Notes (Signed)
[facility] EMERGENCY DEPARTMENT Provider Note   CSN: [id number] Arrival date & time: 12/22/20  1053     History Chief Complaint  Patient presents with   Fall   Knee Injury    [name] is a 80 y.o. female.  HPI Level 5 caveat secondary to patient's history of stroke Patient is awake and is able to give me some history. I did attempt to call the facility and was laced on hold 3 times and eventually disconnected. This is an 80 year old female who was most recently admitted with a incarcerated inguinal hernia, watershed stroke, and right knee dislocation.  She was discharged to rehab facility.  She is sent in today with reports that she fell several days ago and had an x-[name] of her knee obtained yesterday which showed a right knee dislocation.  Patient endorses that she fell last week.  She denies striking her head but does complain of some neck pain.  She denies pain on her left side but states that she cannot move the left side well.  She reports that she fell out of bed.  Review of records from her last admission showed that she had 2 knee dislocations during that visit and was to remain in a immobilizer.      Past Medical History:  Diagnosis Date   Anemia    Colon polyp    History of small bowel obstruction 9/09   likely due to adhesions- pt refused most dx and tx in hospital   History of vaginal bleeding    post menopausal- gyn eval, ? polyp   Hyperlipidemia    Osteoarthritis    knee   Schizoaffective disorder    paranoid personality- refuses treatment   Vitamin D deficiency     Patient Active Problem List   Diagnosis Date Noted   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([place]) 12/10/2020   Knee dislocation, right, initial encounter 12/10/2020   HLD (hyperlipidemia) 12/05/2020   Depression with anxiety 12/05/2020   Coma ([place]) 12/05/2020   Acute encephalopathy    Strangulated inguinal hernia 12/02/2020   Anxiety 11/25/2020   Aortic valve  stenosis 09/09/2020   Prediabetes 05/17/2020   Hypertension 05/10/2020   Age-related osteoporosis without current pathological fracture 01/03/2016   Pure hypercholesterolemia 01/03/2016   Elevated TSH 01/03/2016   Primary osteoarthritis of both knees 12/06/2015   Vitamin D deficiency 06/16/2014   Personal history of other malignant neoplasm of skin 02/19/2014   Prolapse of vaginal vault 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 08/14/2013   Patient on low glycemic diet 08/02/2012   Knee pain 08/02/2012   Gynecological examination 06/20/2011   Neuropathy 06/20/2011   Uterine prolapse 06/20/2011   Anemia 06/18/2009   HYPOKALEMIA, MILD 06/25/2008   INTESTINAL OBSTRUCTION, HX OF 12/11/2007   BLEEDING, POSTMENOPAUSAL 10/23/2006   PERSONALITY DISORDER 10/22/2006   TREMOR, ESSENTIAL 10/22/2006   TRANSAMINASES, SERUM, ELEVATED 10/22/2006    Past Surgical History:  Procedure Laterality Date   ANTERIOR AND POSTERIOR REPAIR N/A 04/23/2012   Procedure: ANTERIOR   REPAIR CYSTOCELE;  Surgeon: Reece Packer, MD;  Location: [facility];  Service: Urology;  Laterality: N/A;  cysto;graft 10x6   APPENDECTOMY     bladder tack  1976   BOWEL RESECTION N/A 12/02/2020   Procedure: SMALL BOWEL RESECTION;  Surgeon: Benjamine Sprague, DO;  Location: [facility];  Service: General;  Laterality: N/A;   INGUINAL HERNIA REPAIR Right 12/02/2020   Procedure: HERNIA REPAIR INGUINAL ADULT;  Surgeon: Benjamine Sprague, DO;  Location: [facility];  Service: General;  Laterality: Right;   KNEE ARTHROSCOPY     right   KNEE ARTHROSCOPY Right    KNEE CLOSED REDUCTION Right 12/07/2020   Procedure: CLOSED MANIPULATION KNEE;  Surgeon: Lovell Sheehan, MD;  Location: [facility];  Service: Orthopedics;  Laterality: Right;   LAPAROSCOPY  1970's   twisted fallopian tube   SALPINGOOPHORECTOMY Bilateral 04/23/2012   Procedure: SALPINGO OOPHORECTOMY;  Surgeon: Emily Filbert, MD;  Location: [place];  Service: Gynecology;  Laterality: Bilateral;   TONSILLECTOMY     VAGINAL  HYSTERECTOMY N/A 04/23/2012   Procedure: HYSTERECTOMY VAGINAL;  Surgeon: Emily Filbert, MD;  Location: [place];  Service: Gynecology;  Laterality: N/A;   VAGINAL PROLAPSE REPAIR N/A 04/23/2012   Procedure: VAGINAL VAULT SUSPENSION;  Surgeon: Reece Packer, MD;  Location: [facility] [facility];  Service: Urology;  Laterality: N/A;     OB History     Gravida  4   Para  3   Term      Preterm      AB  1   Living  3      SAB  1   IAB      Ectopic      Multiple      Live Births              Family History  Problem Relation Age of Onset   Heart disease Mother    Diabetes Mother    Skin cancer Mother    Diabetes Father    Heart disease Father    Hypertension Father    Diabetes Brother    Arthritis Brother    Hypertension Brother    Hyperlipidemia Brother    Arthritis Daughter    Cancer Son    Obesity Daughter     Social History   Tobacco Use   Smoking status: Former    Packs/day: 0.50    Years: 20.00    Pack years: 10.00    Types: Cigarettes    Quit date: 03/13/1982    Years since quitting: 38.8   Smokeless tobacco: Never  Vaping Use   Vaping Use: Never used  Substance Use Topics   Alcohol use: No   Drug use: No    Home Medications Prior to Admission medications   Medication Sig Start Date End Date Taking? Authorizing Provider  acetaminophen (TYLENOL) 325 MG tablet Take 2 tablets (650 mg total) by mouth every 6 (six) hours as needed for fever, headache or mild pain. 12/10/20   Debbe Odea, MD  amLODipine (NORVASC) 10 MG tablet Take 1 tablet (10 mg total) by mouth at bedtime. 12/10/20   Debbe Odea, MD  aspirin 81 MG chewable tablet Chew 1 tablet (81 mg total) by mouth daily. 12/10/20   Debbe Odea, MD  carvedilol (COREG) 6.25 MG tablet Take 1 tablet (6.25 mg total) by mouth 2 (two) times daily with a meal. 11/25/20   Jon Billings, NP  docusate sodium (COLACE) 100 MG capsule Take 1 capsule (100 mg total) by mouth 2 (two) times daily. 12/10/20   Debbe Odea, MD  gabapentin (NEURONTIN) 100 MG capsule Take 1 capsule (100 mg total) by mouth at bedtime. Take 1 capsule at bedtime. If no relief, can go up to 2 tablets at bedtime after 3 days. 09/09/20   McElwee, Scheryl Darter, NP  HYDROcodone-acetaminophen (NORCO/VICODIN) 5-325 MG tablet Take 1 tablet by mouth every 4 (four) hours as needed for up to 10 doses for severe pain. 12/13/20  Debbe Odea, MD  lisinopril (ZESTRIL) 40 MG tablet Take 1 tablet (40 mg total) by mouth daily. 11/25/20   Jon Billings, NP  rosuvastatin (CRESTOR) 10 MG tablet Take 1 tablet (10 mg total) by mouth daily. 11/25/20   Jon Billings, NP  sertraline (ZOLOFT) 25 MG tablet Take 1 tablet (25 mg total) by mouth daily. 11/25/20   Jon Billings, NP  tiZANidine (ZANAFLEX) 4 MG tablet Take 1 tablet (4 mg total) by mouth every 8 (eight) hours as needed for muscle spasms. 12/10/20   Debbe Odea, MD  traMADol (ULTRAM) 50 MG tablet Take 1 tablet (50 mg total) by mouth every 6 (six) hours as needed for moderate pain (mild pain). 12/13/20   Debbe Odea, MD  triamcinolone cream (KENALOG) 0.1 % Apply 1 application topically 2 (two) times daily. 10/12/20   McElwee, Scheryl Darter, NP    Allergies    Penicillins  Review of Systems   Review of Systems  Constitutional:  Positive for activity change.  HENT: Negative.    Eyes: Negative.   Respiratory: Negative.    Cardiovascular: Negative.   Gastrointestinal: Negative.   Endocrine: Negative.   Genitourinary: Negative.   Musculoskeletal:  Positive for neck pain.  Skin: Negative.   Allergic/Immunologic: Negative.   Neurological: Negative.   Hematological: Negative.   Psychiatric/Behavioral: Negative.    All other systems reviewed and are negative.  Physical Exam Updated Vital Signs BP (!) 145/61 (BP Location: Right Arm)   Pulse 72   Temp 98.7 F (37.1 C) (Temporal)   Resp 11   SpO2 99%   Physical Exam Vitals and nursing note reviewed.  Constitutional:      Appearance:  Normal appearance.     Comments: Elderly female appears somewhat unkempt  HENT:     Head: Normocephalic and atraumatic.     Right Ear: External ear normal.     Left Ear: External ear normal.     Nose: Nose normal.     Mouth/Throat:     Pharynx: Oropharynx is clear.  Eyes:     Extraocular Movements: Extraocular movements intact.     Pupils: Pupils are equal, round, and reactive to light.  Neck:     Comments: No collar in place some mild diffuse tenderness palpation Anterior neck normal with trachea midline no JVD noted Cardiovascular:     Rate and Rhythm: Normal rate.  Pulmonary:     Effort: Pulmonary effort is normal.     Breath sounds: Normal breath sounds.  Abdominal:     General: Bowel sounds are normal.     Palpations: Abdomen is soft.  Musculoskeletal:     Comments: Right knee with obvious deformity at the knee she has mild diffuse tenderness.  Skin is intact at the knee pulses.  Pulses are dopplerable he has some mild skin breakdown in the right lower leg, some skin redness in the right upper posterior leg   Skin:    General: Skin is warm and dry.     Coloration: Skin is pale.     Comments: Sacral ulcer noted  Neurological:     Mental Status: She is alert.     Motor: Weakness present.     Comments: Left arm is paralyzed Decreased movement left lower extremity  Psychiatric:        Mood and Affect: Mood normal.    ED Results / Procedures / Treatments   Labs (all labs ordered are listed, but only abnormal results are displayed) Labs Reviewed - No data  to display  EKG None  Radiology DG Knee Complete 4 Views Right  Result Date: 12/22/2020 CLINICAL DATA:  Right knee dislocation. EXAM: RIGHT KNEE - COMPLETE 4+ VIEW COMPARISON:  None. FINDINGS: Three-view exam shows anterior dislocation of the femur relative to the proximal tibia in this patient status post tricompartmental knee replacement. No associated periprosthetic fracture evident on the provided images. Bones  are diffusely demineralized. IMPRESSION: Anterior dislocation of the femur relative to the proximal tibia in this patient status post tricompartmental knee replacement. Electronically Signed   By: Misty Stanley M.D.   On: 12/22/2020 12:37    Procedures .Ortho Injury Treatment  Date/Time: 12/22/2020 3:26 PM Performed by: Pattricia Boss, MD Authorized by: Pattricia Boss, MD   Consent:    Consent obtained:  Written   Consent given by:  Patient   Risks discussed:  Recurrent dislocation and fracture   Alternatives discussed:  No treatmentInjury location: knee Location details: right knee Injury type: dislocation Dislocation type: posterior Pre-procedure neurovascular assessment: neurovascularly intact  Anesthesia: Local anesthesia used: no Manipulation performed: yes Reduction method: traction and counter traction Reduction successful: no Post-procedure distal perfusion: normal Post-procedure neurological function: diminished Post-procedure range of motion: unchanged   .Sedation  Date/Time: 12/22/2020 3:29 PM Performed by: Pattricia Boss, MD Authorized by: Pattricia Boss, MD   Consent:    Consent obtained:  Written   Consent given by:  Patient   Risks discussed:  Prolonged hypoxia resulting in organ damage, allergic reaction, prolonged sedation necessitating reversal and respiratory compromise necessitating ventilatory assistance and intubation Universal protocol:    Procedure explained and questions answered to patient or proxy's satisfaction: yes     Imaging studies available: yes     Immediately prior to procedure, a time out was called: yes     Patient identity confirmed:  Arm band and verbally with patient Indications:    Procedure performed:  Dislocation reduction   Procedure necessitating sedation performed by:  Physician performing sedation Pre-sedation assessment:    Time since last food or drink:  4   ASA classification: class 3 - patient with severe systemic disease      Mouth opening:  2 finger widths   Thyromental distance:  3 finger widths   Mallampati score:  II - soft palate, uvula, fauces visible   Neck mobility: normal     Pre-sedation assessments completed and reviewed: airway patency   Immediate pre-procedure details:    Reassessment: Patient reassessed immediately prior to procedure     Reviewed: vital signs   Procedure details (see MAR for exact dosages):    Preoxygenation:  Nasal cannula   Sedation:  Midazolam   Intended level of sedation: moderate (conscious sedation)   Analgesia:  Fentanyl   Intra-procedure monitoring:  Blood pressure monitoring, continuous capnometry, frequent LOC assessments, cardiac monitor, frequent vital sign checks and continuous pulse oximetry   Intra-procedure events: none     Total Provider sedation time (minutes):  15 Post-procedure details:    Post-sedation assessment completed:  12/22/2020 3:31 PM   Attendance: Constant attendance by certified staff until patient recovered     Recovery: Patient returned to pre-procedure baseline     Post-sedation assessments completed and reviewed: airway patency, cardiovascular function and mental status     Post-sedation assessments completed and reviewed: post-procedure nausea and vomiting status not reviewed     Patient is stable for discharge or admission: yes     Procedure completion:  Tolerated well, no immediate complications   Medications Ordered in ED  Medications - No data to display  ED Course  I have reviewed the triage vital signs and the nursing notes.  Pertinent labs & imaging results that were available during my care of the patient were reviewed by me and considered in my medical decision making (see chart for details). Right x-Christopher Hink of knee obtained prior to my evaluation significant for anterior dislocation of the femur relative to the proximal tibia no fracture noted Plan CT head, neck, and pelvis x-Estil Vallee. Will attempt reduction in the ED.  However,  patient had previously gone to the operating room for the knee reduction.  Discussed with Dr. Vanita Panda and he assumes care MDM Rules/Calculators/A&P                            Final Clinical Impression(s) / ED Diagnoses Final diagnoses:  Fall  Knee dislocation, right, initial encounter  Urinary tract infection without hematuria, site unspecified    Rx / DC Orders ED Discharge Orders     None        Pattricia Boss, MD 12/22/20 1606

## 2020-12-22 NOTE — ED Notes (Signed)
Pulses found with doppler on R foot.

## 2020-12-22 NOTE — Progress Notes (Signed)
Transfer from [facility]  "80 year old recently admitted to [facility] for strangulated inguinal hernia and underwent surgery on 9/22.  2 days later she had a stroke (bilateral watershed infarcts secondary to hypotension).  Also during this hospitalization found to have a dislocated right knee and underwent surgery on 9/27.  Sent to the ED today from [facility] rehab after she had a fall this weekend and injured her right knee.  X-ray showing anterior dislocation of the femur relative to the proximal tibia.  ED physician spoke to orthopedics both here at [facility] and at [facility].  Orthopedic surgeon at [facility] Dr. Harlow Mares has accepted to see the patient in consultation and will take her to the OR tomorrow.  Patient also found to have a UTI and was given a dose of ceftriaxone.  No signs of sepsis".

## 2020-12-22 NOTE — ED Notes (Signed)
Left with [facility]

## 2020-12-23 ENCOUNTER — Encounter: Payer: Self-pay | Admitting: Internal Medicine

## 2020-12-23 ENCOUNTER — Inpatient Hospital Stay: Payer: Medicare PPO

## 2020-12-23 ENCOUNTER — Other Ambulatory Visit: Payer: Self-pay

## 2020-12-23 DIAGNOSIS — N390 Urinary tract infection, site not specified: Secondary | ICD-10-CM

## 2020-12-23 DIAGNOSIS — S83104A Unspecified dislocation of right knee, initial encounter: Secondary | ICD-10-CM | POA: Diagnosis present

## 2020-12-23 DIAGNOSIS — Z8673 Personal history of transient ischemic attack (TIA), and cerebral infarction without residual deficits: Secondary | ICD-10-CM

## 2020-12-23 DIAGNOSIS — L8990 Pressure ulcer of unspecified site, unspecified stage: Secondary | ICD-10-CM | POA: Insufficient documentation

## 2020-12-23 MED ORDER — ACETAMINOPHEN 650 MG RE SUPP: 650.0000 mg | Freq: Four times a day (QID) | RECTAL | Status: DC | PRN

## 2020-12-23 MED ORDER — INFLUENZA VAC A&B SA ADJ QUAD 0.5 ML IM PRSY: 0.5000 mL | PREFILLED_SYRINGE | INTRAMUSCULAR | Status: DC

## 2020-12-23 MED ORDER — ROSUVASTATIN CALCIUM 10 MG PO TABS
10.0000 mg | ORAL_TABLET | Freq: Every day | ORAL | Status: DC
  Administered 2020-12-23 – 2020-12-27 (×4): 10 mg via ORAL
  Filled 2020-12-23 (×4): qty 1

## 2020-12-23 MED ORDER — FOSFOMYCIN TROMETHAMINE 3 G PO PACK
3.0000 g | PACK | Freq: Once | ORAL | Status: AC
  Administered 2020-12-23: 3 g via ORAL
  Filled 2020-12-23: qty 3

## 2020-12-23 MED ORDER — ASPIRIN 81 MG PO CHEW
81.0000 mg | CHEWABLE_TABLET | Freq: Every day | ORAL | Status: DC
  Administered 2020-12-23 – 2020-12-27 (×4): 81 mg via ORAL
  Filled 2020-12-23 (×4): qty 1

## 2020-12-23 MED ORDER — CARVEDILOL 3.125 MG PO TABS
6.2500 mg | ORAL_TABLET | Freq: Two times a day (BID) | ORAL | Status: DC
  Administered 2020-12-23 – 2020-12-27 (×8): 6.25 mg via ORAL
  Filled 2020-12-23 (×8): qty 2

## 2020-12-23 MED ORDER — MORPHINE SULFATE (PF) 2 MG/ML IV SOLN
1.0000 mg | INTRAVENOUS | Status: DC | PRN
  Administered 2020-12-23: 1 mg via INTRAVENOUS
  Filled 2020-12-23: qty 1

## 2020-12-23 MED ORDER — CLINDAMYCIN PHOSPHATE 900 MG/50ML IV SOLN
900.0000 mg | INTRAVENOUS | Status: AC
  Administered 2020-12-24: 900 mg via INTRAVENOUS
  Filled 2020-12-23: qty 50

## 2020-12-23 MED ORDER — HYDROCODONE-ACETAMINOPHEN 5-325 MG PO TABS
1.0000 | ORAL_TABLET | ORAL | Status: DC | PRN
  Administered 2020-12-23 – 2020-12-25 (×2): 1 via ORAL
  Filled 2020-12-23 (×3): qty 1

## 2020-12-23 MED ORDER — ONDANSETRON HCL 4 MG/2ML IJ SOLN
4.0000 mg | Freq: Four times a day (QID) | INTRAMUSCULAR | Status: DC | PRN
  Administered 2020-12-24: 4 mg via INTRAVENOUS

## 2020-12-23 MED ORDER — ONDANSETRON HCL 4 MG PO TABS: 4.0000 mg | ORAL_TABLET | Freq: Four times a day (QID) | ORAL | Status: DC | PRN

## 2020-12-23 MED ORDER — SERTRALINE HCL 50 MG PO TABS
25.0000 mg | ORAL_TABLET | Freq: Every day | ORAL | Status: DC
  Administered 2020-12-23 – 2020-12-27 (×4): 25 mg via ORAL
  Filled 2020-12-23 (×4): qty 1

## 2020-12-23 MED ORDER — PNEUMOCOCCAL VAC POLYVALENT 25 MCG/0.5ML IJ INJ
0.5000 mL | INJECTION | INTRAMUSCULAR | Status: DC
Start: 2020-12-24 — End: 2020-12-27

## 2020-12-23 MED ORDER — ACETAMINOPHEN 325 MG PO TABS
650.0000 mg | ORAL_TABLET | Freq: Four times a day (QID) | ORAL | Status: DC | PRN
  Administered 2020-12-26: 650 mg via ORAL
  Filled 2020-12-23: qty 2

## 2020-12-23 MED ORDER — SODIUM CHLORIDE 0.9 % IV SOLN
1.0000 g | INTRAVENOUS | Status: DC
  Filled 2020-12-23: qty 10

## 2020-12-23 MED ORDER — GABAPENTIN 100 MG PO CAPS
100.0000 mg | ORAL_CAPSULE | Freq: Every day | ORAL | Status: DC
  Administered 2020-12-23 – 2020-12-26 (×4): 100 mg via ORAL
  Filled 2020-12-23 (×4): qty 1

## 2020-12-23 MED ORDER — AMLODIPINE BESYLATE 10 MG PO TABS
10.0000 mg | ORAL_TABLET | Freq: Every day | ORAL | Status: DC
  Administered 2020-12-23: 10 mg via ORAL
  Filled 2020-12-23: qty 1

## 2020-12-23 NOTE — Progress Notes (Signed)
Received pt. from [facility] transport team. The patient was oriented to the unit and plan of care reviewed. Voices understanding. Safety precautions in place. Call bell within reach. Instructed to call for assistance. Will continue to monitor.

## 2020-12-23 NOTE — Care Management Important Message (Signed)
Important Message  Patient Details  Name: [name] MRN: [id number] Date of Birth: [birth year]   Medicare Important Message Given:  N/A - LOS <3 / Initial given by admissions     Dannette Barbara 12/23/2020, 4:48 PM

## 2020-12-23 NOTE — TOC Progression Note (Signed)
Transition of Care [facility]) - Progression Note    Patient Details  Name: [name] MRN: [id number] Date of Birth: 1941/02/19  Transition of Care [facility]) CM/SW [place], RN Phone Number: 12/23/2020, 8:56 AM  Clinical Narrative:    The patient had a recent admission where she discharged to [facility] SNF to [facility], She had a fall at [facility] several days prior to coming to the hospital, Surgery is anticipated for Friday, TOC will continue to follow and assist with DC planning, anticipate will likely need to DC to [facility] once again        Expected Discharge Plan and Services                                                 Social Determinants of Health (SDOH) Interventions    Readmission Risk Interventions No flowsheet data found.

## 2020-12-23 NOTE — Consult Note (Signed)
Full consult note to follow. Unfortunate situation with recurrent knee dislocations. Patient would require revision knee arthroplasty to avoid further dislocations and instability. Will discuss with family. Would plan on doing Friday (tomorrow), and allow for stabilization of patient and treatment of UTI. Please call with questions.

## 2020-12-23 NOTE — Consult Note (Signed)
ORTHOPAEDIC CONSULTATION  REQUESTING PHYSICIAN: Wouk, Ailene Rud, MD  Chief Complaint: right knee pain  HPI: [name] is a 80 y.o. female with medical history significant for htn, schizoaffective disorder, who presents after fall from snf. Recent hospitalization for incarcerated inguinal hernia s/p operative repair. Hospital course complicated by cva (watershed infarcts 2/2 postoperative hypotension) and right knee dislocation s/p reduction x2. She presents s/p fall at SNF, found to have recurrent right knee dislocation. Ortho asked to consult.  Past Medical History:  Diagnosis Date   Anemia    Colon polyp    History of small bowel obstruction 9/09   likely due to adhesions- pt refused most dx and tx in hospital   History of vaginal bleeding    post menopausal- gyn eval, ? polyp   Hyperlipidemia    Osteoarthritis    knee   Schizoaffective disorder    paranoid personality- refuses treatment   Vitamin D deficiency    Past Surgical History:  Procedure Laterality Date   ANTERIOR AND POSTERIOR REPAIR N/A 04/23/2012   Procedure: ANTERIOR   REPAIR CYSTOCELE;  Surgeon: Reece Packer, MD;  Location: [facility];  Service: Urology;  Laterality: N/A;  cysto;graft 10x6   APPENDECTOMY     bladder tack  1976   BOWEL RESECTION N/A 12/02/2020   Procedure: SMALL BOWEL RESECTION;  Surgeon: Benjamine Sprague, DO;  Location: [facility];  Service: General;  Laterality: N/A;   INGUINAL HERNIA REPAIR Right 12/02/2020   Procedure: HERNIA REPAIR INGUINAL ADULT;  Surgeon: Benjamine Sprague, DO;  Location: [facility];  Service: General;  Laterality: Right;   KNEE ARTHROSCOPY     right   KNEE ARTHROSCOPY Right    KNEE CLOSED REDUCTION Right 12/07/2020   Procedure: CLOSED MANIPULATION KNEE;  Surgeon: Lovell Sheehan, MD;  Location: [facility];  Service: Orthopedics;  Laterality: Right;   LAPAROSCOPY  1970's   twisted fallopian tube   SALPINGOOPHORECTOMY Bilateral 04/23/2012   Procedure: SALPINGO  OOPHORECTOMY;  Surgeon: Emily Filbert, MD;  Location: [facility];  Service: Gynecology;  Laterality: Bilateral;   TONSILLECTOMY     VAGINAL HYSTERECTOMY N/A 04/23/2012   Procedure: HYSTERECTOMY VAGINAL;  Surgeon: Emily Filbert, MD;  Location: [facility];  Service: Gynecology;  Laterality: N/A;   VAGINAL PROLAPSE REPAIR N/A 04/23/2012   Procedure: VAGINAL VAULT SUSPENSION;  Surgeon: Reece Packer, MD;  Location: [facility];  Service: Urology;  Laterality: N/A;   Social History   Socioeconomic History   Marital status: Widowed    Spouse name: Not on file   Number of children: 3   Years of education: Not on file   Highest education level: Not on file  Occupational History   Occupation: retired Pharmacist, hospital  Tobacco Use   Smoking status: Former    Packs/day: 0.50    Years: 20.00    Pack years: 10.00    Types: Cigarettes    Quit date: 03/13/1982    Years since quitting: 38.8   Smokeless tobacco: Never  Vaping Use   Vaping Use: Never used  Substance and Sexual Activity   Alcohol use: No   Drug use: No   Sexual activity: Yes    Birth control/protection: Post-menopausal  Other Topics Concern   Not on file  Social History Narrative   Lives alone   Social Determinants of Health   Financial Resource Strain: Not on file  Food Insecurity: Not on file  Transportation Needs: Not on file  Physical Activity: Not on file  Stress:  Not on file  Social Connections: Not on file   Family History  Problem Relation Age of Onset   Heart disease Mother    Diabetes Mother    Skin cancer Mother    Diabetes Father    Heart disease Father    Hypertension Father    Diabetes Brother    Arthritis Brother    Hypertension Brother    Hyperlipidemia Brother    Arthritis Daughter    Cancer Son    Obesity Daughter    Allergies  Allergen Reactions   Penicillins Rash    TOLERATED CEFAZOLIN   Prior to Admission medications   Medication Sig Start Date End Date Taking? Authorizing Provider  acetaminophen  (TYLENOL) 325 MG tablet Take 2 tablets (650 mg total) by mouth every 6 (six) hours as needed for fever, headache or mild pain. 12/10/20   Debbe Odea, MD  amLODipine (NORVASC) 10 MG tablet Take 1 tablet (10 mg total) by mouth at bedtime. 12/10/20   Debbe Odea, MD  aspirin 81 MG chewable tablet Chew 1 tablet (81 mg total) by mouth daily. 12/10/20   Debbe Odea, MD  carvedilol (COREG) 6.25 MG tablet Take 1 tablet (6.25 mg total) by mouth 2 (two) times daily with a meal. 11/25/20   Jon Billings, NP  docusate sodium (COLACE) 100 MG capsule Take 1 capsule (100 mg total) by mouth 2 (two) times daily. Patient taking differently: Take 100 mg by mouth every other day. 12/10/20   Debbe Odea, MD  gabapentin (NEURONTIN) 100 MG capsule Take 1 capsule (100 mg total) by mouth at bedtime. Take 1 capsule at bedtime. If no relief, can go up to 2 tablets at bedtime after 3 days. 09/09/20   McElwee, Scheryl Darter, NP  HYDROcodone-acetaminophen (NORCO/VICODIN) 5-325 MG tablet Take 1 tablet by mouth every 4 (four) hours as needed for up to 10 doses for severe pain. 12/13/20   Debbe Odea, MD  lisinopril (ZESTRIL) 40 MG tablet Take 1 tablet (40 mg total) by mouth daily. 11/25/20   Jon Billings, NP  nystatin (MYCOSTATIN/NYSTOP) powder Apply 1 application topically 3 (three) times daily.    [provider]  rosuvastatin (CRESTOR) 10 MG tablet Take 1 tablet (10 mg total) by mouth daily. 11/25/20   Jon Billings, NP  sertraline (ZOLOFT) 25 MG tablet Take 1 tablet (25 mg total) by mouth daily. 11/25/20   Jon Billings, NP  tiZANidine (ZANAFLEX) 4 MG tablet Take 1 tablet (4 mg total) by mouth every 8 (eight) hours as needed for muscle spasms. 12/10/20   Debbe Odea, MD  traMADol (ULTRAM) 50 MG tablet Take 1 tablet (50 mg total) by mouth every 6 (six) hours as needed for moderate pain (mild pain). Patient not taking: No sig reported 12/13/20   Debbe Odea, MD  triamcinolone cream (KENALOG) 0.1 % Apply 1  application topically 2 (two) times daily. 10/12/20   Charyl Dancer, NP   CT HEAD WO CONTRAST  Result Date: 12/22/2020 CLINICAL DATA:  Fall, facial trauma EXAM: CT HEAD WITHOUT CONTRAST CT CERVICAL SPINE WITHOUT CONTRAST TECHNIQUE: Multidetector CT imaging of the head and cervical spine was performed following the standard protocol without intravenous contrast. Multiplanar CT image reconstructions of the cervical spine were also generated. COMPARISON:  None. FINDINGS: CT HEAD FINDINGS Brain: No evidence of acute infarction, hemorrhage, hydrocephalus, extra-axial collection or mass lesion/mass effect. Periventricular and deep white matter hypodensity. Vascular: No hyperdense vessel or unexpected calcification. Skull: Normal. Negative for fracture or focal lesion. Sinuses/Orbits: No  acute finding. Other: None. CT CERVICAL SPINE FINDINGS Alignment: Normal. Skull base and vertebrae: No acute fracture. No primary bone lesion or focal pathologic process. Severe atlantoaxial arthrosis. Soft tissues and spinal canal: No prevertebral fluid or swelling. No visible canal hematoma. Disc levels: Moderate multilevel disc space height loss and osteophytosis. Upper chest: Negative. Other: None. IMPRESSION: 1. No acute intracranial pathology. Small-vessel white matter disease. 2. No fracture or static subluxation of the cervical spine. 3. Moderate multilevel cervical disc degenerative disease. Severe atlantoaxial arthrosis. Electronically Signed   By: Delanna Ahmadi M.D.   On: 12/22/2020 15:04   CT CERVICAL SPINE WO CONTRAST  Result Date: 12/22/2020 CLINICAL DATA:  Fall, facial trauma EXAM: CT HEAD WITHOUT CONTRAST CT CERVICAL SPINE WITHOUT CONTRAST TECHNIQUE: Multidetector CT imaging of the head and cervical spine was performed following the standard protocol without intravenous contrast. Multiplanar CT image reconstructions of the cervical spine were also generated. COMPARISON:  None. FINDINGS: CT HEAD FINDINGS Brain:  No evidence of acute infarction, hemorrhage, hydrocephalus, extra-axial collection or mass lesion/mass effect. Periventricular and deep white matter hypodensity. Vascular: No hyperdense vessel or unexpected calcification. Skull: Normal. Negative for fracture or focal lesion. Sinuses/Orbits: No acute finding. Other: None. CT CERVICAL SPINE FINDINGS Alignment: Normal. Skull base and vertebrae: No acute fracture. No primary bone lesion or focal pathologic process. Severe atlantoaxial arthrosis. Soft tissues and spinal canal: No prevertebral fluid or swelling. No visible canal hematoma. Disc levels: Moderate multilevel disc space height loss and osteophytosis. Upper chest: Negative. Other: None. IMPRESSION: 1. No acute intracranial pathology. Small-vessel white matter disease. 2. No fracture or static subluxation of the cervical spine. 3. Moderate multilevel cervical disc degenerative disease. Severe atlantoaxial arthrosis. Electronically Signed   By: Delanna Ahmadi M.D.   On: 12/22/2020 15:04   CT Knee Right Wo Contrast  Result Date: 12/22/2020 CLINICAL DATA:  Fracture, knee s/p fall, per ortho. EXAM: CT OF THE RIGHT KNEE WITHOUT CONTRAST TECHNIQUE: Multidetector CT imaging of the RIGHT knee was performed according to the standard protocol. Multiplanar CT image reconstructions were also generated. COMPARISON:  Plain radiographs completed at 11:54 a.m. FINDINGS: Bones/Joint/Cartilage Right total knee arthroplasty has been performed. Streak artifact slightly limits the examination. There is posterior dislocation and mild override of the tibia in relation to the distal femur. No associated fracture. No lytic lesion. Ligaments Suboptimally assessed by CT. Muscles and Tendons Extensor mechanism appears intact. Mild fatty atrophy diffusely the visualized right lower extremity musculature. Soft tissues Small right knee effusion. Mild prepatellar soft tissue swelling. Vascular calcifications noted within the right lower  extremity runoff. Patency of the vasculature is not well assessed on this noncontrast examination. IMPRESSION: Status post right total knee arthroplasty. Posterior right knee dislocation. No associated fracture. Small effusion. Electronically Signed   By: Fidela Salisbury M.D.   On: 12/22/2020 21:09   DG Pelvis Portable  Result Date: 12/22/2020 CLINICAL DATA:  Fall, knee arthroplasty dislocation EXAM: PORTABLE PELVIS 1-2 VIEWS COMPARISON:  None. FINDINGS: Osteopenia. There is no evidence of displaced pelvic fracture or diastasis. No pelvic bone lesions are seen. IMPRESSION: Osteopenia. No displaced or dislocation of the bilateral hips and pelvis seen in frontal view only. Please note that plain radiographs are insensitive for hip and pelvic fracture. Electronically Signed   By: Delanna Ahmadi M.D.   On: 12/22/2020 15:00   DG Knee Complete 4 Views Right  Result Date: 12/22/2020 CLINICAL DATA:  Right knee dislocation. EXAM: RIGHT KNEE - COMPLETE 4+ VIEW COMPARISON:  None. FINDINGS: Three-view exam  shows anterior dislocation of the femur relative to the proximal tibia in this patient status post tricompartmental knee replacement. No associated periprosthetic fracture evident on the provided images. Bones are diffusely demineralized. IMPRESSION: Anterior dislocation of the femur relative to the proximal tibia in this patient status post tricompartmental knee replacement. Electronically Signed   By: Misty Stanley M.D.   On: 12/22/2020 12:37    Positive ROS: All other systems have been reviewed and were otherwise negative with the exception of those mentioned in the HPI and as above.  Physical Exam: General: Alert, no acute distress Cardiovascular: No pedal edema Respiratory: No cyanosis, no use of accessory musculature GI: No organomegaly, abdomen is soft and non-tender Skin: No lesions in the area of chief complaint Neurologic: Sensation intact distally Psychiatric: Patient is competent for consent  with normal mood and affect Lymphatic: No axillary or cervical lymphadenopathy  MUSCULOSKELETAL: Right knee dislocated with obvious swelling and deformity. Compartments soft. Good cap refill. Motor and sensory intact distally.  Assessment: Recurrent right total knee dislocation  Plan: Revision right knee arthroplasty to avoid further dislocations and instability. Dr. Harlow Mares talked to family and scheduled for noon 12/24/20 NWB right lower extremity NPO after midnight    Carlynn Spry, PA-C    12/23/2020 12:55 PM

## 2020-12-23 NOTE — H&P (Signed)
History and Physical    Tracie Dore KDX:[id number] DOB: [birth year] DOA: 12/22/2020  PCP: Jon Billings, NP   Patient coming from: home  I have personally briefly reviewed patient's old medical records in [place]  Chief Complaint: knee pain, dislocation  HPI: Deztinee Lohmeyer is a 80 y.o. female with medical history significant for HTN, , depression with anxiety, recently hospitalized from 9/38-10/3 for strangulated hernia undergoing small bowel resection on 9/22, with visit complicated by code stroke in which she was found to have bilateral cerebral hemispheric watershed infarcts related to postop hypotension, further complicated by a right knee dislocation s/p reduction on 9/27 and again on 10/1, discharge with knee immobilizer to remain on at all times, who was transferred from the ED at [facility] due to recurrent right knee dislocation following a fall out of bed several days prior at the rehab facility.  Patient was also incidentally found to have a UTI and was started on Rocephin prior to transfer  ED course: Vitals unremarkable Blood work significant for urinalysis consistent with UTI otherwise unremarkable  Imaging: CT right knee without contrast: Posterior right knee dislocation no associated fracture.  Small effusion CT head and C-spine with no acute injury X-ray pelvis with no acute fracture  Patient admitted to hospitalist service.  The ED provider spoke with orthopedist Dr. Harlow Mares who will take patient to the OR in the a.m.  Review of Systems: As per HPI otherwise all other systems on review of systems negative.    Past Medical History:  Diagnosis Date   Anemia    Colon polyp    History of small bowel obstruction 9/09   likely due to adhesions- pt refused most dx and tx in hospital   History of vaginal bleeding    post menopausal- gyn eval, ? polyp   Hyperlipidemia    Osteoarthritis    knee   Schizoaffective disorder    paranoid  personality- refuses treatment   Vitamin D deficiency     Past Surgical History:  Procedure Laterality Date   ANTERIOR AND POSTERIOR REPAIR N/A 04/23/2012   Procedure: ANTERIOR   REPAIR CYSTOCELE;  Surgeon: Reece Packer, MD;  Location: [facility];  Service: Urology;  Laterality: N/A;  cysto;graft 10x6   APPENDECTOMY     bladder tack  1976   BOWEL RESECTION N/A 12/02/2020   Procedure: SMALL BOWEL RESECTION;  Surgeon: Benjamine Sprague, DO;  Location: [facility];  Service: General;  Laterality: N/A;   INGUINAL HERNIA REPAIR Right 12/02/2020   Procedure: HERNIA REPAIR INGUINAL ADULT;  Surgeon: Benjamine Sprague, DO;  Location: [facility];  Service: General;  Laterality: Right;   KNEE ARTHROSCOPY     right   KNEE ARTHROSCOPY Right    KNEE CLOSED REDUCTION Right 12/07/2020   Procedure: CLOSED MANIPULATION KNEE;  Surgeon: Lovell Sheehan, MD;  Location: [facility];  Service: Orthopedics;  Laterality: Right;   LAPAROSCOPY  1970's   twisted fallopian tube   SALPINGOOPHORECTOMY Bilateral 04/23/2012   Procedure: SALPINGO OOPHORECTOMY;  Surgeon: Emily Filbert, MD;  Location: [facility];  Service: Gynecology;  Laterality: Bilateral;   TONSILLECTOMY     VAGINAL HYSTERECTOMY N/A 04/23/2012   Procedure: HYSTERECTOMY VAGINAL;  Surgeon: Emily Filbert, MD;  Location: [facility];  Service: Gynecology;  Laterality: N/A;   VAGINAL PROLAPSE REPAIR N/A 04/23/2012   Procedure: VAGINAL VAULT SUSPENSION;  Surgeon: Reece Packer, MD;  Location: [facility];  Service: Urology;  Laterality: N/A;     reports  that she quit smoking about 38 years ago. Her smoking use included cigarettes. She has a 10.00 pack-year smoking history. She has never used smokeless tobacco. She reports that she does not drink alcohol and does not use drugs.  Allergies  Allergen Reactions   Penicillins Rash    TOLERATED CEFAZOLIN    Family History  Problem Relation Age of Onset   Heart disease Mother    Diabetes Mother    Skin cancer Mother    Diabetes Father     Heart disease Father    Hypertension Father    Diabetes Brother    Arthritis Brother    Hypertension Brother    Hyperlipidemia Brother    Arthritis Daughter    Cancer Son    Obesity Daughter       Prior to Admission medications   Medication Sig Start Date End Date Taking? Authorizing Provider  acetaminophen (TYLENOL) 325 MG tablet Take 2 tablets (650 mg total) by mouth every 6 (six) hours as needed for fever, headache or mild pain. 12/10/20   Debbe Odea, MD  amLODipine (NORVASC) 10 MG tablet Take 1 tablet (10 mg total) by mouth at bedtime. 12/10/20   Debbe Odea, MD  aspirin 81 MG chewable tablet Chew 1 tablet (81 mg total) by mouth daily. 12/10/20   Debbe Odea, MD  carvedilol (COREG) 6.25 MG tablet Take 1 tablet (6.25 mg total) by mouth 2 (two) times daily with a meal. 11/25/20   Jon Billings, NP  docusate sodium (COLACE) 100 MG capsule Take 1 capsule (100 mg total) by mouth 2 (two) times daily. Patient taking differently: Take 100 mg by mouth every other day. 12/10/20   Debbe Odea, MD  gabapentin (NEURONTIN) 100 MG capsule Take 1 capsule (100 mg total) by mouth at bedtime. Take 1 capsule at bedtime. If no relief, can go up to 2 tablets at bedtime after 3 days. 09/09/20   McElwee, Scheryl Darter, NP  HYDROcodone-acetaminophen (NORCO/VICODIN) 5-325 MG tablet Take 1 tablet by mouth every 4 (four) hours as needed for up to 10 doses for severe pain. 12/13/20   Debbe Odea, MD  lisinopril (ZESTRIL) 40 MG tablet Take 1 tablet (40 mg total) by mouth daily. 11/25/20   Jon Billings, NP  nystatin (MYCOSTATIN/NYSTOP) powder Apply 1 application topically 3 (three) times daily.    [provider]  rosuvastatin (CRESTOR) 10 MG tablet Take 1 tablet (10 mg total) by mouth daily. 11/25/20   Jon Billings, NP  sertraline (ZOLOFT) 25 MG tablet Take 1 tablet (25 mg total) by mouth daily. 11/25/20   Jon Billings, NP  tiZANidine (ZANAFLEX) 4 MG tablet Take 1 tablet (4 mg total) by  mouth every 8 (eight) hours as needed for muscle spasms. 12/10/20   Debbe Odea, MD  traMADol (ULTRAM) 50 MG tablet Take 1 tablet (50 mg total) by mouth every 6 (six) hours as needed for moderate pain (mild pain). Patient not taking: No sig reported 12/13/20   Debbe Odea, MD  triamcinolone cream (KENALOG) 0.1 % Apply 1 application topically 2 (two) times daily. 10/12/20   Charyl Dancer, NP    Physical Exam: There were no vitals filed for this visit.   There were no vitals filed for this visit.    Constitutional: Alert and oriented x 3 . Not in any apparent distress HEENT:      Head: Normocephalic and atraumatic.         Eyes: PERLA, EOMI, Conjunctivae are normal. Sclera is non-icteric.  Mouth/Throat: Mucous membranes are moist.       Neck: Supple with no signs of meningismus. Cardiovascular: Regular rate and rhythm. No murmurs, gallops, or rubs. 2+ symmetrical distal pulses are present . No JVD. No LE edema Respiratory: Respiratory effort normal .Lungs sounds clear bilaterally. No wheezes, crackles, or rhonchi.  Gastrointestinal: Soft, non tender, and non distended with positive bowel sounds.  Genitourinary: No CVA tenderness. Musculoskeletal: Right leg in knee immobilizer. No cyanosis, or erythema of extremities. Neurologic:  Face is symmetric. Moving all extremities. No gross focal neurologic deficits . Skin: Skin is warm, dry.  No rash or ulcers Psychiatric: Mood and affect are normal    Labs on Admission: I have personally reviewed following labs and imaging studies  CBC: Recent Labs  Lab 12/22/20 1402  WBC 8.3  HGB 11.3*  HCT 34.6*  MCV 89.4  PLT 850   Basic Metabolic Panel: Recent Labs  Lab 12/22/20 1402  NA 134*  K 4.0  CL 103  CO2 20*  GLUCOSE 95  BUN 17  CREATININE 0.57  CALCIUM 9.4   GFR: CrCl cannot be calculated (Unknown ideal weight.). Liver Function Tests: Recent Labs  Lab 12/22/20 1402  AST 20  ALT 14  ALKPHOS 59  BILITOT 0.7   PROT 6.1*  ALBUMIN 3.3*   No results for input(s): LIPASE, AMYLASE in the last 168 hours. No results for input(s): AMMONIA in the last 168 hours. Coagulation Profile: No results for input(s): INR, PROTIME in the last 168 hours. Cardiac Enzymes: No results for input(s): CKTOTAL, CKMB, CKMBINDEX, TROPONINI in the last 168 hours. BNP (last 3 results) No results for input(s): PROBNP in the last 8760 hours. HbA1C: No results for input(s): HGBA1C in the last 72 hours. CBG: No results for input(s): GLUCAP in the last 168 hours. Lipid Profile: No results for input(s): CHOL, HDL, LDLCALC, TRIG, CHOLHDL, LDLDIRECT in the last 72 hours. Thyroid Function Tests: No results for input(s): TSH, T4TOTAL, FREET4, T3FREE, THYROIDAB in the last 72 hours. Anemia Panel: No results for input(s): VITAMINB12, FOLATE, FERRITIN, TIBC, IRON, RETICCTPCT in the last 72 hours. Urine analysis:    Component Value Date/Time   COLORURINE YELLOW 12/22/2020 1414   APPEARANCEUR CLOUDY (A) 12/22/2020 1414   APPEARANCEUR Cloudy (A) 08/31/2020 1039   LABSPEC 1.014 12/22/2020 1414   PHURINE 6.0 12/22/2020 1414   GLUCOSEU NEGATIVE 12/22/2020 1414   HGBUR SMALL (A) 12/22/2020 1414   BILIRUBINUR NEGATIVE 12/22/2020 1414   BILIRUBINUR Negative 08/31/2020 1039   KETONESUR NEGATIVE 12/22/2020 1414   PROTEINUR NEGATIVE 12/22/2020 1414   UROBILINOGEN 0.2 06/25/2008 1242   NITRITE POSITIVE (A) 12/22/2020 1414   LEUKOCYTESUR LARGE (A) 12/22/2020 1414    Radiological Exams on Admission: CT HEAD WO CONTRAST  Result Date: 12/22/2020 CLINICAL DATA:  Fall, facial trauma EXAM: CT HEAD WITHOUT CONTRAST CT CERVICAL SPINE WITHOUT CONTRAST TECHNIQUE: Multidetector CT imaging of the head and cervical spine was performed following the standard protocol without intravenous contrast. Multiplanar CT image reconstructions of the cervical spine were also generated. COMPARISON:  None. FINDINGS: CT HEAD FINDINGS Brain: No evidence of acute  infarction, hemorrhage, hydrocephalus, extra-axial collection or mass lesion/mass effect. Periventricular and deep white matter hypodensity. Vascular: No hyperdense vessel or unexpected calcification. Skull: Normal. Negative for fracture or focal lesion. Sinuses/Orbits: No acute finding. Other: None. CT CERVICAL SPINE FINDINGS Alignment: Normal. Skull base and vertebrae: No acute fracture. No primary bone lesion or focal pathologic process. Severe atlantoaxial arthrosis. Soft tissues and spinal canal: No prevertebral fluid  or swelling. No visible canal hematoma. Disc levels: Moderate multilevel disc space height loss and osteophytosis. Upper chest: Negative. Other: None. IMPRESSION: 1. No acute intracranial pathology. Small-vessel white matter disease. 2. No fracture or static subluxation of the cervical spine. 3. Moderate multilevel cervical disc degenerative disease. Severe atlantoaxial arthrosis. Electronically Signed   By: Delanna Ahmadi M.D.   On: 12/22/2020 15:04   CT CERVICAL SPINE WO CONTRAST  Result Date: 12/22/2020 CLINICAL DATA:  Fall, facial trauma EXAM: CT HEAD WITHOUT CONTRAST CT CERVICAL SPINE WITHOUT CONTRAST TECHNIQUE: Multidetector CT imaging of the head and cervical spine was performed following the standard protocol without intravenous contrast. Multiplanar CT image reconstructions of the cervical spine were also generated. COMPARISON:  None. FINDINGS: CT HEAD FINDINGS Brain: No evidence of acute infarction, hemorrhage, hydrocephalus, extra-axial collection or mass lesion/mass effect. Periventricular and deep white matter hypodensity. Vascular: No hyperdense vessel or unexpected calcification. Skull: Normal. Negative for fracture or focal lesion. Sinuses/Orbits: No acute finding. Other: None. CT CERVICAL SPINE FINDINGS Alignment: Normal. Skull base and vertebrae: No acute fracture. No primary bone lesion or focal pathologic process. Severe atlantoaxial arthrosis. Soft tissues and spinal  canal: No prevertebral fluid or swelling. No visible canal hematoma. Disc levels: Moderate multilevel disc space height loss and osteophytosis. Upper chest: Negative. Other: None. IMPRESSION: 1. No acute intracranial pathology. Small-vessel white matter disease. 2. No fracture or static subluxation of the cervical spine. 3. Moderate multilevel cervical disc degenerative disease. Severe atlantoaxial arthrosis. Electronically Signed   By: Delanna Ahmadi M.D.   On: 12/22/2020 15:04   CT Knee Right Wo Contrast  Result Date: 12/22/2020 CLINICAL DATA:  Fracture, knee s/p fall, per ortho. EXAM: CT OF THE RIGHT KNEE WITHOUT CONTRAST TECHNIQUE: Multidetector CT imaging of the RIGHT knee was performed according to the standard protocol. Multiplanar CT image reconstructions were also generated. COMPARISON:  Plain radiographs completed at 11:54 a.m. FINDINGS: Bones/Joint/Cartilage Right total knee arthroplasty has been performed. Streak artifact slightly limits the examination. There is posterior dislocation and mild override of the tibia in relation to the distal femur. No associated fracture. No lytic lesion. Ligaments Suboptimally assessed by CT. Muscles and Tendons Extensor mechanism appears intact. Mild fatty atrophy diffusely the visualized right lower extremity musculature. Soft tissues Small right knee effusion. Mild prepatellar soft tissue swelling. Vascular calcifications noted within the right lower extremity runoff. Patency of the vasculature is not well assessed on this noncontrast examination. IMPRESSION: Status post right total knee arthroplasty. Posterior right knee dislocation. No associated fracture. Small effusion. Electronically Signed   By: Fidela Salisbury M.D.   On: 12/22/2020 21:09   DG Pelvis Portable  Result Date: 12/22/2020 CLINICAL DATA:  Fall, knee arthroplasty dislocation EXAM: PORTABLE PELVIS 1-2 VIEWS COMPARISON:  None. FINDINGS: Osteopenia. There is no evidence of displaced pelvic  fracture or diastasis. No pelvic bone lesions are seen. IMPRESSION: Osteopenia. No displaced or dislocation of the bilateral hips and pelvis seen in frontal view only. Please note that plain radiographs are insensitive for hip and pelvic fracture. Electronically Signed   By: Delanna Ahmadi M.D.   On: 12/22/2020 15:00   DG Knee Complete 4 Views Right  Result Date: 12/22/2020 CLINICAL DATA:  Right knee dislocation. EXAM: RIGHT KNEE - COMPLETE 4+ VIEW COMPARISON:  None. FINDINGS: Three-view exam shows anterior dislocation of the femur relative to the proximal tibia in this patient status post tricompartmental knee replacement. No associated periprosthetic fracture evident on the provided images. Bones are diffusely demineralized. IMPRESSION: Anterior dislocation of  the femur relative to the proximal tibia in this patient status post tricompartmental knee replacement. Electronically Signed   By: Misty Stanley M.D.   On: 12/22/2020 12:37     Assessment/Plan 80 year old female with history of HTN, depression with anxiety,  recently hospitalized from 9/38-10/3 f for laparoscopy for strangulated hernia with stay complicated by watershed infarcts from perioperative hypotension as well as recurrent right knee dislocation, readmitted for recurrent right knee dislocation following a fall several days prior at the nursing facility.,    Dislocated knee, right, recurrent, initial encounter   Fall at rehab -Patient with right knee dislocation following a fall several days prior with previous reduction on 9/27 and on 10/1 - Trauma imaging otherwise negative - N.p.o. for reduction in the OR in the a.m. Per prior conversation between Dr. Harlow Mares and ED provider - Pain control -Avoid tight BP control perioperatively due to recent watershed infarct related to hypotension - Continue knee immobilizer    UTI (urinary tract infection) - Urinalysis consistent with UTI - Continue Rocephin and follow cultures    History  of CVA (cerebrovascular accident) - S/p watershed infarct related to the postoperative hypotension - Avoid tight perioperative BP control - Continue carvedilol, aspirin and rosuvastatin    Hypertension - We will continue carvedilol only for now    Depression with anxiety - Continue Zoloft    DVT prophylaxis: SCDs Code Status: DNR  Family Communication:  none  Disposition Plan: Back to previous home environment Consults called: nonebowers  Status:At the time of admission, it appears that the appropriate admission status for this patient is INPATIENT. This is judged to be reasonable and necessary in order to provide the required intensity of service to ensure the patient's safety given the presenting symptoms, physical exam findings, and initial radiographic and laboratory data in the context of their  Comorbid conditions.   Patient requires inpatient status due to high intensity of service, high risk for further deterioration and high frequency of surveillance required.   I certify that at the point of admission it is my clinical judgment that the patient will require inpatient hospital care spanning beyond [place] MD [facility]     12/23/2020, 12:22 AM

## 2020-12-23 NOTE — Progress Notes (Signed)
PROGRESS NOTE    [name]  WYO:[id number] DOB: 05/07/1940 DOA: 12/22/2020 PCP: Jon Billings, NP  Outpatient Specialists: none    Brief Narrative:   [name] is a 80 y.o. female with medical history significant for htn, schizoaffective disorder, who presents after fall from snf. Recent hospitalization for incarcerated inguinal hernia s/p operative repair. Hospital course complicated by cva (watershed infarcts 2/2 postoperative hypotension) and right knee dislocation s/p reduction x2. She presents s/p fall at SNF, found to have recurrent right knee dislocation.   Assessment & Plan:   Principal Problem:   Dislocated knee, right, recurrent, initial encounter Active Problems:   Hypertension   Depression with anxiety   History of CVA (cerebrovascular accident)   UTI (urinary tract infection)   Right knee dislocation, initial encounter   Pressure injury of skin  # Recurrent right knee dislocation Currently in immobilizer. Ortho to see. If operative repair the plan would likely go tomorrow. Low-risk nuclear stress test earlier this year. - maintain knee immobilizer - f/u ortho recs when available - pt/ot consults once treatment plan in place  # Pyuria Patient denies suprapubic pain or dysuria but does endorse several months frequency - will order fosfomycin x1, f/u culture  # Hx CVA Stable. Recent, 2/2 post-op hypotension - relative permissive htn as below - cont asa, statin  # HTN Here bp elevated to 774J systolic. Will endeavor to maintain systolic below 287 - cont home coreg - resume home amlodipine - hold home lisinopril  # MDD - home sertraline   DVT prophylaxis: SCDs Code Status: DNR Family Communication: no answer when daughter called on 10/13  Level of care: Med-Surg Status is: Inpatient  Remains inpatient appropriate because:Inpatient level of care appropriate due to severity of illness  Dispo: The patient is from: SNF               Anticipated d/c is to: SNF              Patient currently is not medically stable to d/c.   Difficult to place patient No        Consultants:  orthopedics  Procedures: none  Antimicrobials:  S/p ceftriaxone    Subjective: This morning denies pain. No dysuria or urgency or hematuria.   Objective: There were no vitals filed for this visit.  Intake/Output Summary (Last 24 hours) at 12/23/2020 0905 Last data filed at 12/23/2020 8676 Gross per 24 hour  Intake 120 ml  Output 750 ml  Net -630 ml   There were no vitals filed for this visit.  Examination:  General exam: Appears calm and comfortable  Respiratory system: Clear to auscultation. Respiratory effort normal. Cardiovascular system: S1 & S2 heard, RRR. No JVD, murmurs, rubs, gallops or clicks. No pedal edema. Gastrointestinal system: Abdomen is nondistended, soft and nontender. No organomegaly or masses felt. Normal bowel sounds heard. Central nervous system: Alert and oriented. Left sided weakness Extremities: warm Skin: stage 1 sacral decub Psychiatry: Judgement and insight appear normal. Affect depressed     Data Reviewed: I have personally reviewed following labs and imaging studies  CBC: Recent Labs  Lab 12/22/20 1402  WBC 8.3  HGB 11.3*  HCT 34.6*  MCV 89.4  PLT 720   Basic Metabolic Panel: Recent Labs  Lab 12/22/20 1402  NA 134*  K 4.0  CL 103  CO2 20*  GLUCOSE 95  BUN 17  CREATININE 0.57  CALCIUM 9.4   GFR: CrCl cannot be calculated (Unknown ideal weight.).  Liver Function Tests: Recent Labs  Lab 12/22/20 1402  AST 20  ALT 14  ALKPHOS 59  BILITOT 0.7  PROT 6.1*  ALBUMIN 3.3*   No results for input(s): LIPASE, AMYLASE in the last 168 hours. No results for input(s): AMMONIA in the last 168 hours. Coagulation Profile: No results for input(s): INR, PROTIME in the last 168 hours. Cardiac Enzymes: No results for input(s): CKTOTAL, CKMB, CKMBINDEX, TROPONINI in the  last 168 hours. BNP (last 3 results) No results for input(s): PROBNP in the last 8760 hours. HbA1C: No results for input(s): HGBA1C in the last 72 hours. CBG: No results for input(s): GLUCAP in the last 168 hours. Lipid Profile: No results for input(s): CHOL, HDL, LDLCALC, TRIG, CHOLHDL, LDLDIRECT in the last 72 hours. Thyroid Function Tests: No results for input(s): TSH, T4TOTAL, FREET4, T3FREE, THYROIDAB in the last 72 hours. Anemia Panel: No results for input(s): VITAMINB12, FOLATE, FERRITIN, TIBC, IRON, RETICCTPCT in the last 72 hours. Urine analysis:    Component Value Date/Time   COLORURINE YELLOW 12/22/2020 1414   APPEARANCEUR CLOUDY (A) 12/22/2020 1414   APPEARANCEUR Cloudy (A) 08/31/2020 1039   LABSPEC 1.014 12/22/2020 1414   PHURINE 6.0 12/22/2020 1414   GLUCOSEU NEGATIVE 12/22/2020 1414   HGBUR SMALL (A) 12/22/2020 1414   BILIRUBINUR NEGATIVE 12/22/2020 1414   BILIRUBINUR Negative 08/31/2020 1039   KETONESUR NEGATIVE 12/22/2020 1414   PROTEINUR NEGATIVE 12/22/2020 1414   UROBILINOGEN 0.2 06/25/2008 1242   NITRITE POSITIVE (A) 12/22/2020 1414   LEUKOCYTESUR LARGE (A) 12/22/2020 1414   Sepsis Labs: @LABRCNTIP (procalcitonin:4,lacticidven:4)  ) Recent Results (from the past 240 hour(s))  Resp Panel by RT-PCR (Flu A&B, Covid) Nasopharyngeal Swab     Status: None   Collection Time: 12/13/20 10:46 AM   Specimen: Nasopharyngeal Swab; Nasopharyngeal(NP) swabs in vial transport medium  Result Value Ref Range Status   SARS Coronavirus 2 by RT PCR NEGATIVE NEGATIVE Final    Comment: (NOTE) SARS-CoV-2 target nucleic acids are NOT DETECTED.  The SARS-CoV-2 RNA is generally detectable in upper respiratory specimens during the acute phase of infection. The lowest concentration of SARS-CoV-2 viral copies this assay can detect is 138 copies/mL. A negative result does not preclude SARS-Cov-2 infection and should not be used as the sole basis for treatment or other patient  management decisions. A negative result may occur with  improper specimen collection/handling, submission of specimen other than nasopharyngeal swab, presence of viral mutation(s) within the areas targeted by this assay, and inadequate number of viral copies(<138 copies/mL). A negative result must be combined with clinical observations, patient history, and epidemiological information. The expected result is Negative.  Fact Sheet for Patients:  EntrepreneurPulse.com.au  Fact Sheet for Healthcare Providers:  IncredibleEmployment.be  This test is no t yet approved or cleared by the [place] FDA and  has been authorized for detection and/or diagnosis of SARS-CoV-2 by FDA under an Emergency Use Authorization (EUA). This EUA will remain  in effect (meaning this test can be used) for the duration of the COVID-19 declaration under Section 564(b)(1) of the Act, 21 U.S.C.section 360bbb-3(b)(1), unless the authorization is terminated  or revoked sooner.       Influenza A by PCR NEGATIVE NEGATIVE Final   Influenza B by PCR NEGATIVE NEGATIVE Final    Comment: (NOTE) The Xpert Xpress SARS-CoV-2/FLU/RSV plus assay is intended as an aid in the diagnosis of influenza from Nasopharyngeal swab specimens and should not be used as a sole basis for treatment. Nasal washings and aspirates are  unacceptable for Xpert Xpress SARS-CoV-2/FLU/RSV testing.  Fact Sheet for Patients: EntrepreneurPulse.com.au  Fact Sheet for Healthcare Providers: IncredibleEmployment.be  This test is not yet approved or cleared by the [place] FDA and has been authorized for detection and/or diagnosis of SARS-CoV-2 by FDA under an Emergency Use Authorization (EUA). This EUA will remain in effect (meaning this test can be used) for the duration of the COVID-19 declaration under Section 564(b)(1) of the Act, 21 U.S.C. section 360bbb-3(b)(1),  unless the authorization is terminated or revoked.  Performed at [facility], [address] [place], [place] [address]   Resp Panel by RT-PCR (Flu A&B, Covid) Nasopharyngeal Swab     Status: None   Collection Time: 12/22/20  2:14 PM   Specimen: Nasopharyngeal Swab; Nasopharyngeal(NP) swabs in vial transport medium  Result Value Ref Range Status   SARS Coronavirus 2 by RT PCR NEGATIVE NEGATIVE Final    Comment: (NOTE) SARS-CoV-2 target nucleic acids are NOT DETECTED.  The SARS-CoV-2 RNA is generally detectable in upper respiratory specimens during the acute phase of infection. The lowest concentration of SARS-CoV-2 viral copies this assay can detect is 138 copies/mL. A negative result does not preclude SARS-Cov-2 infection and should not be used as the sole basis for treatment or other patient management decisions. A negative result may occur with  improper specimen collection/handling, submission of specimen other than nasopharyngeal swab, presence of viral mutation(s) within the areas targeted by this assay, and inadequate number of viral copies(<138 copies/mL). A negative result must be combined with clinical observations, patient history, and epidemiological information. The expected result is Negative.  Fact Sheet for Patients:  EntrepreneurPulse.com.au  Fact Sheet for Healthcare Providers:  IncredibleEmployment.be  This test is no t yet approved or cleared by the [place] FDA and  has been authorized for detection and/or diagnosis of SARS-CoV-2 by FDA under an Emergency Use Authorization (EUA). This EUA will remain  in effect (meaning this test can be used) for the duration of the COVID-19 declaration under Section 564(b)(1) of the Act, 21 U.S.C.section 360bbb-3(b)(1), unless the authorization is terminated  or revoked sooner.       Influenza A by PCR NEGATIVE NEGATIVE Final   Influenza B by PCR NEGATIVE NEGATIVE  Final    Comment: (NOTE) The Xpert Xpress SARS-CoV-2/FLU/RSV plus assay is intended as an aid in the diagnosis of influenza from Nasopharyngeal swab specimens and should not be used as a sole basis for treatment. Nasal washings and aspirates are unacceptable for Xpert Xpress SARS-CoV-2/FLU/RSV testing.  Fact Sheet for Patients: EntrepreneurPulse.com.au  Fact Sheet for Healthcare Providers: IncredibleEmployment.be  This test is not yet approved or cleared by the [place] FDA and has been authorized for detection and/or diagnosis of SARS-CoV-2 by FDA under an Emergency Use Authorization (EUA). This EUA will remain in effect (meaning this test can be used) for the duration of the COVID-19 declaration under Section 564(b)(1) of the Act, 21 U.S.C. section 360bbb-3(b)(1), unless the authorization is terminated or revoked.  Performed at [facility], [place] [address] [place], [place] [address]          Radiology Studies: CT HEAD WO CONTRAST  Result Date: 12/22/2020 CLINICAL DATA:  Fall, facial trauma EXAM: CT HEAD WITHOUT CONTRAST CT CERVICAL SPINE WITHOUT CONTRAST TECHNIQUE: Multidetector CT imaging of the head and cervical spine was performed following the standard protocol without intravenous contrast. Multiplanar CT image reconstructions of the cervical spine were also generated. COMPARISON:  None. FINDINGS: CT HEAD FINDINGS Brain:  No evidence of acute infarction, hemorrhage, hydrocephalus, extra-axial collection or mass lesion/mass effect. Periventricular and deep white matter hypodensity. Vascular: No hyperdense vessel or unexpected calcification. Skull: Normal. Negative for fracture or focal lesion. Sinuses/Orbits: No acute finding. Other: None. CT CERVICAL SPINE FINDINGS Alignment: Normal. Skull base and vertebrae: No acute fracture. No primary bone lesion or focal pathologic process. Severe atlantoaxial arthrosis. Soft tissues and  spinal canal: No prevertebral fluid or swelling. No visible canal hematoma. Disc levels: Moderate multilevel disc space height loss and osteophytosis. Upper chest: Negative. Other: None. IMPRESSION: 1. No acute intracranial pathology. Small-vessel white matter disease. 2. No fracture or static subluxation of the cervical spine. 3. Moderate multilevel cervical disc degenerative disease. Severe atlantoaxial arthrosis. Electronically Signed   By: Delanna Ahmadi M.D.   On: 12/22/2020 15:04   CT CERVICAL SPINE WO CONTRAST  Result Date: 12/22/2020 CLINICAL DATA:  Fall, facial trauma EXAM: CT HEAD WITHOUT CONTRAST CT CERVICAL SPINE WITHOUT CONTRAST TECHNIQUE: Multidetector CT imaging of the head and cervical spine was performed following the standard protocol without intravenous contrast. Multiplanar CT image reconstructions of the cervical spine were also generated. COMPARISON:  None. FINDINGS: CT HEAD FINDINGS Brain: No evidence of acute infarction, hemorrhage, hydrocephalus, extra-axial collection or mass lesion/mass effect. Periventricular and deep white matter hypodensity. Vascular: No hyperdense vessel or unexpected calcification. Skull: Normal. Negative for fracture or focal lesion. Sinuses/Orbits: No acute finding. Other: None. CT CERVICAL SPINE FINDINGS Alignment: Normal. Skull base and vertebrae: No acute fracture. No primary bone lesion or focal pathologic process. Severe atlantoaxial arthrosis. Soft tissues and spinal canal: No prevertebral fluid or swelling. No visible canal hematoma. Disc levels: Moderate multilevel disc space height loss and osteophytosis. Upper chest: Negative. Other: None. IMPRESSION: 1. No acute intracranial pathology. Small-vessel white matter disease. 2. No fracture or static subluxation of the cervical spine. 3. Moderate multilevel cervical disc degenerative disease. Severe atlantoaxial arthrosis. Electronically Signed   By: Delanna Ahmadi M.D.   On: 12/22/2020 15:04   CT Knee  Right Wo Contrast  Result Date: 12/22/2020 CLINICAL DATA:  Fracture, knee s/p fall, per ortho. EXAM: CT OF THE RIGHT KNEE WITHOUT CONTRAST TECHNIQUE: Multidetector CT imaging of the RIGHT knee was performed according to the standard protocol. Multiplanar CT image reconstructions were also generated. COMPARISON:  Plain radiographs completed at 11:54 a.m. FINDINGS: Bones/Joint/Cartilage Right total knee arthroplasty has been performed. Streak artifact slightly limits the examination. There is posterior dislocation and mild override of the tibia in relation to the distal femur. No associated fracture. No lytic lesion. Ligaments Suboptimally assessed by CT. Muscles and Tendons Extensor mechanism appears intact. Mild fatty atrophy diffusely the visualized right lower extremity musculature. Soft tissues Small right knee effusion. Mild prepatellar soft tissue swelling. Vascular calcifications noted within the right lower extremity runoff. Patency of the vasculature is not well assessed on this noncontrast examination. IMPRESSION: Status post right total knee arthroplasty. Posterior right knee dislocation. No associated fracture. Small effusion. Electronically Signed   By: Fidela Salisbury M.D.   On: 12/22/2020 21:09   DG Pelvis Portable  Result Date: 12/22/2020 CLINICAL DATA:  Fall, knee arthroplasty dislocation EXAM: PORTABLE PELVIS 1-2 VIEWS COMPARISON:  None. FINDINGS: Osteopenia. There is no evidence of displaced pelvic fracture or diastasis. No pelvic bone lesions are seen. IMPRESSION: Osteopenia. No displaced or dislocation of the bilateral hips and pelvis seen in frontal view only. Please note that plain radiographs are insensitive for hip and pelvic fracture. Electronically Signed   By: Jamse Mead.D.  On: 12/22/2020 15:00   DG Knee Complete 4 Views Right  Result Date: 12/22/2020 CLINICAL DATA:  Right knee dislocation. EXAM: RIGHT KNEE - COMPLETE 4+ VIEW COMPARISON:  None. FINDINGS: Three-view exam  shows anterior dislocation of the femur relative to the proximal tibia in this patient status post tricompartmental knee replacement. No associated periprosthetic fracture evident on the provided images. Bones are diffusely demineralized. IMPRESSION: Anterior dislocation of the femur relative to the proximal tibia in this patient status post tricompartmental knee replacement. Electronically Signed   By: Misty Stanley M.D.   On: 12/22/2020 12:37        Scheduled Meds:  amLODipine  10 mg Oral Daily   aspirin  81 mg Oral Daily   carvedilol  6.25 mg Oral BID WC   gabapentin  100 mg Oral QHS   [START ON 12/24/2020] influenza vaccine adjuvanted  0.5 mL Intramuscular Tomorrow-1000   [START ON 12/24/2020] pneumococcal 23 valent vaccine  0.5 mL Intramuscular Tomorrow-1000   rosuvastatin  10 mg Oral Daily   sertraline  25 mg Oral Daily   Continuous Infusions:  cefTRIAXone (ROCEPHIN)  IV       LOS: 1 day    Time spent: 66 min    Desma Maxim, MD [facility]   If 7PM-7AM, please contact night-coverage [email or web address] Password [email or web address] 12/23/2020, 9:05 AM

## 2020-12-24 ENCOUNTER — Encounter
Admission: RE | Disposition: A | Discharge status: Skilled Nursing Facility | Payer: Self-pay | Source: Skilled Nursing Facility | Attending: Obstetrics and Gynecology

## 2020-12-24 ENCOUNTER — Encounter: Payer: Self-pay | Admitting: Internal Medicine

## 2020-12-24 ENCOUNTER — Inpatient Hospital Stay: Payer: Medicare PPO | Admitting: Anesthesiology

## 2020-12-24 ENCOUNTER — Inpatient Hospital Stay: Payer: Medicare PPO

## 2020-12-24 DIAGNOSIS — S83104A Unspecified dislocation of right knee, initial encounter: Secondary | ICD-10-CM | POA: Diagnosis not present

## 2020-12-24 HISTORY — PX: TOTAL KNEE REVISION: SHX996

## 2020-12-24 LAB — BASIC METABOLIC PANEL
Anion gap: 8 (ref 5–15)
BUN: 18 mg/dL (ref 8–23)
CO2: 24 mmol/L (ref 22–32)
Calcium: 9.2 mg/dL (ref 8.9–10.3)
Chloride: 102 mmol/L (ref 98–111)
Creatinine, Ser: 0.43 mg/dL — ABNORMAL LOW (ref 0.44–1.00)
GFR, Estimated: >60 mL/min (ref >60)
Glucose, Bld: 86 mg/dL (ref 70–99)
Potassium: 3.8 mmol/L (ref 3.5–5.1)
Sodium: 134 mmol/L — ABNORMAL LOW (ref 135–145)

## 2020-12-24 SURGERY — TOTAL KNEE REVISION
Anesthesia: General | Site: Knee | Laterality: Right

## 2020-12-24 MED ORDER — SUGAMMADEX SODIUM 200 MG/2ML IV SOLN
INTRAVENOUS | Status: DC | PRN
  Administered 2020-12-24: 150 mg via INTRAVENOUS

## 2020-12-24 MED ORDER — ONDANSETRON HCL 4 MG PO TABS: 4.0000 mg | ORAL_TABLET | Freq: Four times a day (QID) | ORAL | Status: DC | PRN

## 2020-12-24 MED ORDER — ROCURONIUM BROMIDE 10 MG/ML (PF) SYRINGE
PREFILLED_SYRINGE | INTRAVENOUS | Status: AC
  Filled 2020-12-24: qty 10

## 2020-12-24 MED ORDER — METOCLOPRAMIDE HCL 5 MG/ML IJ SOLN: 5.0000 mg | Freq: Three times a day (TID) | INTRAMUSCULAR | Status: DC | PRN

## 2020-12-24 MED ORDER — MENTHOL 3 MG MT LOZG
1.0000 | LOZENGE | OROMUCOSAL | Status: DC | PRN
  Filled 2020-12-24: qty 9

## 2020-12-24 MED ORDER — SODIUM CHLORIDE (PF) 0.9 % IJ SOLN
INTRAMUSCULAR | Status: AC
  Filled 2020-12-24: qty 50

## 2020-12-24 MED ORDER — ENOXAPARIN SODIUM 40 MG/0.4ML IJ SOSY
40.0000 mg | PREFILLED_SYRINGE | INTRAMUSCULAR | Status: DC
  Administered 2020-12-25 – 2020-12-27 (×3): 40 mg via SUBCUTANEOUS
  Filled 2020-12-24 (×3): qty 0.4

## 2020-12-24 MED ORDER — LIDOCAINE HCL (PF) 2 % IJ SOLN
INTRAMUSCULAR | Status: AC
  Filled 2020-12-24: qty 5

## 2020-12-24 MED ORDER — DIPHENHYDRAMINE HCL 12.5 MG/5ML PO ELIX: 12.5000 mg | ORAL_SOLUTION | ORAL | Status: DC | PRN

## 2020-12-24 MED ORDER — PROPOFOL 10 MG/ML IV BOLUS
INTRAVENOUS | Status: AC
  Filled 2020-12-24: qty 20

## 2020-12-24 MED ORDER — FENTANYL CITRATE (PF) 100 MCG/2ML IJ SOLN
INTRAMUSCULAR | Status: AC
  Filled 2020-12-24: qty 2

## 2020-12-24 MED ORDER — CLINDAMYCIN PHOSPHATE 600 MG/50ML IV SOLN
600.0000 mg | Freq: Four times a day (QID) | INTRAVENOUS | Status: AC
  Administered 2020-12-24 – 2020-12-25 (×2): 600 mg via INTRAVENOUS
  Filled 2020-12-24 (×2): qty 50

## 2020-12-24 MED ORDER — OXYCODONE HCL 5 MG PO TABS: 5.0000 mg | ORAL_TABLET | Freq: Once | ORAL | Status: DC | PRN

## 2020-12-24 MED ORDER — PHENYLEPHRINE HCL (PRESSORS) 10 MG/ML IV SOLN
INTRAVENOUS | Status: DC | PRN
  Administered 2020-12-24 (×9): 80 ug via INTRAVENOUS

## 2020-12-24 MED ORDER — SODIUM CHLORIDE 0.9 % IR SOLN
Status: DC | PRN
  Administered 2020-12-24: 1000 mL
  Administered 2020-12-24: 3000 mL

## 2020-12-24 MED ORDER — SODIUM CHLORIDE (PF) 0.9 % IJ SOLN
INTRAMUSCULAR | Status: DC | PRN
  Administered 2020-12-24: 90 mL via INTRA_ARTICULAR

## 2020-12-24 MED ORDER — STERILE WATER FOR IRRIGATION IR SOLN
Status: DC | PRN
  Administered 2020-12-24: 1000 mL

## 2020-12-24 MED ORDER — BUPIVACAINE-EPINEPHRINE (PF) 0.5% -1:200000 IJ SOLN
INTRAMUSCULAR | Status: AC
  Filled 2020-12-24: qty 30

## 2020-12-24 MED ORDER — ONDANSETRON HCL 4 MG/2ML IJ SOLN
INTRAMUSCULAR | Status: AC
  Filled 2020-12-24: qty 2

## 2020-12-24 MED ORDER — NEOMYCIN-POLYMYXIN B GU 40-200000 IR SOLN
Status: AC
  Filled 2020-12-24: qty 20

## 2020-12-24 MED ORDER — PHENYLEPHRINE HCL-NACL 20-0.9 MG/250ML-% IV SOLN
INTRAVENOUS | Status: DC | PRN
  Administered 2020-12-24: 20 ug/min via INTRAVENOUS

## 2020-12-24 MED ORDER — FENTANYL CITRATE (PF) 100 MCG/2ML IJ SOLN
INTRAMUSCULAR | Status: DC | PRN
  Administered 2020-12-24 (×2): 50 ug via INTRAVENOUS
  Administered 2020-12-24: 25 ug via INTRAVENOUS
  Administered 2020-12-24: 50 ug via INTRAVENOUS
  Administered 2020-12-24: 25 ug via INTRAVENOUS

## 2020-12-24 MED ORDER — ALUM & MAG HYDROXIDE-SIMETH 200-200-20 MG/5ML PO SUSP: 30.0000 mL | ORAL | Status: DC | PRN

## 2020-12-24 MED ORDER — BUPIVACAINE LIPOSOME 1.3 % IJ SUSP
INTRAMUSCULAR | Status: AC
  Filled 2020-12-24: qty 20

## 2020-12-24 MED ORDER — ACETAMINOPHEN 10 MG/ML IV SOLN
INTRAVENOUS | Status: DC | PRN
  Administered 2020-12-24: 1000 mg via INTRAVENOUS

## 2020-12-24 MED ORDER — LIDOCAINE HCL (CARDIAC) PF 100 MG/5ML IV SOSY
PREFILLED_SYRINGE | INTRAVENOUS | Status: DC | PRN
  Administered 2020-12-24: 80 mg via INTRAVENOUS

## 2020-12-24 MED ORDER — BISACODYL 10 MG RE SUPP
10.0000 mg | Freq: Every day | RECTAL | Status: DC | PRN
  Filled 2020-12-24: qty 1

## 2020-12-24 MED ORDER — ONDANSETRON HCL 4 MG/2ML IJ SOLN: 4.0000 mg | Freq: Four times a day (QID) | INTRAMUSCULAR | Status: DC | PRN

## 2020-12-24 MED ORDER — DEXAMETHASONE SODIUM PHOSPHATE 10 MG/ML IJ SOLN
INTRAMUSCULAR | Status: DC | PRN
  Administered 2020-12-24: 10 mg via INTRAVENOUS

## 2020-12-24 MED ORDER — SODIUM CHLORIDE 0.9 % IR SOLN
Status: DC | PRN
  Administered 2020-12-24: 1004 mL

## 2020-12-24 MED ORDER — KETOROLAC TROMETHAMINE 30 MG/ML IJ SOLN
INTRAMUSCULAR | Status: AC
  Filled 2020-12-24: qty 1

## 2020-12-24 MED ORDER — KETOROLAC TROMETHAMINE 30 MG/ML IJ SOLN
INTRAMUSCULAR | Status: DC | PRN
  Administered 2020-12-24: 15 mg via INTRAVENOUS

## 2020-12-24 MED ORDER — OXYCODONE HCL 5 MG/5ML PO SOLN
5.0000 mg | Freq: Once | ORAL | Status: DC | PRN
Start: 2020-12-24 — End: 2020-12-24

## 2020-12-24 MED ORDER — CLINDAMYCIN PHOSPHATE 900 MG/50ML IV SOLN
INTRAVENOUS | Status: AC
  Filled 2020-12-24: qty 50

## 2020-12-24 MED ORDER — LACTATED RINGERS IV SOLN: INTRAVENOUS | Status: DC | PRN

## 2020-12-24 MED ORDER — DOCUSATE SODIUM 100 MG PO CAPS
100.0000 mg | ORAL_CAPSULE | Freq: Two times a day (BID) | ORAL | Status: DC
  Administered 2020-12-24 – 2020-12-27 (×6): 100 mg via ORAL
  Filled 2020-12-24 (×6): qty 1

## 2020-12-24 MED ORDER — LACTATED RINGERS IV SOLN: INTRAVENOUS | Status: DC

## 2020-12-24 MED ORDER — PROPOFOL 10 MG/ML IV BOLUS
INTRAVENOUS | Status: DC | PRN
  Administered 2020-12-24: 120 mg via INTRAVENOUS

## 2020-12-24 MED ORDER — ROCURONIUM BROMIDE 100 MG/10ML IV SOLN
INTRAVENOUS | Status: DC | PRN
  Administered 2020-12-24: 50 mg via INTRAVENOUS

## 2020-12-24 MED ORDER — ACETAMINOPHEN 10 MG/ML IV SOLN
INTRAVENOUS | Status: AC
  Filled 2020-12-24: qty 100

## 2020-12-24 MED ORDER — PHENOL 1.4 % MT LIQD
1.0000 | OROMUCOSAL | Status: DC | PRN
  Filled 2020-12-24: qty 177

## 2020-12-24 MED ORDER — METOCLOPRAMIDE HCL 10 MG PO TABS
5.0000 mg | ORAL_TABLET | Freq: Three times a day (TID) | ORAL | Status: DC | PRN
Start: 2020-12-24 — End: 2020-12-27

## 2020-12-24 MED ORDER — FENTANYL CITRATE (PF) 100 MCG/2ML IJ SOLN: 25.0000 ug | INTRAMUSCULAR | Status: DC | PRN

## 2020-12-24 SURGICAL SUPPLY — 65 items
APL PRP STRL LF DISP 70% ISPRP (MISCELLANEOUS) ×2
BLADE SAGITTAL AGGR TOOTH XLG (BLADE) ×1 IMPLANT
BLADE SAGITTAL WIDE XTHICK NO (BLADE) ×1 IMPLANT
BRUSH SCRUB EZ  4% CHG (MISCELLANEOUS) ×4
BRUSH SCRUB EZ 4% CHG (MISCELLANEOUS) ×2 IMPLANT
CHLORAPREP W/TINT 26 (MISCELLANEOUS) ×4 IMPLANT
COOLER POLAR GLACIER W/PUMP (MISCELLANEOUS) ×2 IMPLANT
CUFF TOURN SGL QUICK 24 (TOURNIQUET CUFF)
CUFF TOURN SGL QUICK 34 (TOURNIQUET CUFF) ×2
CUFF TRNQT CYL 24X4X16.5-23 (TOURNIQUET CUFF) IMPLANT
CUFF TRNQT CYL 34X4.125X (TOURNIQUET CUFF) IMPLANT
DISTAL WEDGE 5MM SZ 4 (Orthopedic Implant) ×2 IMPLANT
DRAPE 3/4 80X56 (DRAPES) ×2 IMPLANT
DRAPE INCISE IOBAN 66X60 STRL (DRAPES) ×2 IMPLANT
DRSG AQUACEL AG ADV 3.5X14 (GAUZE/BANDAGES/DRESSINGS) ×2 IMPLANT
FEMUR OXINIUM SZ 4 KNEE (Femur) ×1 IMPLANT
GAUZE 4X4 16PLY ~~LOC~~+RFID DBL (SPONGE) ×2 IMPLANT
GAUZE XEROFORM 1X8 LF (GAUZE/BANDAGES/DRESSINGS) ×2 IMPLANT
GLOVE SURG ORTHO LTX SZ8 (GLOVE) ×4 IMPLANT
GLOVE SURG UNDER LTX SZ8 (GLOVE) ×2 IMPLANT
GOWN STRL REUS W/ TWL LRG LVL3 (GOWN DISPOSABLE) ×2 IMPLANT
GOWN STRL REUS W/ TWL XL LVL3 (GOWN DISPOSABLE) ×1 IMPLANT
GOWN STRL REUS W/TWL LRG LVL3 (GOWN DISPOSABLE) ×4
GOWN STRL REUS W/TWL XL LVL3 (GOWN DISPOSABLE) ×2
HOLDER FOLEY CATH W/STRAP (MISCELLANEOUS) ×2 IMPLANT
IMMBOLIZER KNEE 19 BLUE UNIV (SOFTGOODS) ×1 IMPLANT
INSERT ARTICULAR 13MM (Knees) ×1 IMPLANT
IRRIGATION SURGIPHOR STRL (IV SOLUTION) ×1 IMPLANT
IV NS 1000ML (IV SOLUTION) ×2
IV NS 1000ML BAXH (IV SOLUTION) ×1 IMPLANT
KIT TURNOVER KIT A (KITS) ×2 IMPLANT
LABEL OR SOLS (LABEL) ×2 IMPLANT
MANIFOLD NEPTUNE II (INSTRUMENTS) ×2 IMPLANT
MAT ABSORB  FLUID 56X50 GRAY (MISCELLANEOUS) ×2
MAT ABSORB FLUID 56X50 GRAY (MISCELLANEOUS) ×1 IMPLANT
NDL SAFETY ECLIPSE 18X1.5 (NEEDLE) ×1 IMPLANT
NDL SPNL 20GX3.5 QUINCKE YW (NEEDLE) ×1 IMPLANT
NEEDLE HYPO 18GX1.5 SHARP (NEEDLE) ×2
NEEDLE SPNL 20GX3.5 QUINCKE YW (NEEDLE) ×2 IMPLANT
NS IRRIG 1000ML POUR BTL (IV SOLUTION) ×2 IMPLANT
OSTEOTOME THIN 10.0 1.5 (INSTRUMENTS) ×1 IMPLANT
OSTEOTOME THIN 10.0 3 (INSTRUMENTS) ×1 IMPLANT
OSTEOTOME THIN 6.0 3 (INSTRUMENTS) ×1 IMPLANT
PACK TOTAL KNEE (MISCELLANEOUS) ×2 IMPLANT
PAD ABD DERMACEA PRESS 5X9 (GAUZE/BANDAGES/DRESSINGS) ×1 IMPLANT
PAD DE MAYO PRESSURE PROTECT (MISCELLANEOUS) ×2 IMPLANT
PAD WRAPON POLAR KNEE (MISCELLANEOUS) ×2 IMPLANT
PULSAVAC PLUS IRRIG FAN TIP (DISPOSABLE) ×2
SPONGE T-LAP 18X18 ~~LOC~~+RFID (SPONGE) ×6 IMPLANT
STAPLER SKIN PROX 35W (STAPLE) ×2 IMPLANT
STEM PRESSFIT TRT 18X160 (Stem) ×1 IMPLANT
SUCTION FRAZIER HANDLE 10FR (MISCELLANEOUS) ×2
SUCTION TUBE FRAZIER 10FR DISP (MISCELLANEOUS) ×1 IMPLANT
SUT DVC 2 QUILL PDO  T11 36X36 (SUTURE) ×2
SUT DVC 2 QUILL PDO T11 36X36 (SUTURE) ×1 IMPLANT
SUT VIC AB 2-0 CT1 18 (SUTURE) ×2 IMPLANT
SUT VIC AB 2-0 CT1 27 (SUTURE) ×2
SUT VIC AB 2-0 CT1 TAPERPNT 27 (SUTURE) ×1 IMPLANT
SUT VIC AB PLUS 45CM 1-MO-4 (SUTURE) ×2 IMPLANT
SYR 30ML LL (SYRINGE) ×4 IMPLANT
TIP FAN IRRIG PULSAVAC PLUS (DISPOSABLE) ×1 IMPLANT
TRAY FOLEY MTR SLVR 16FR STAT (SET/KITS/TRAYS/PACK) ×2 IMPLANT
WATER STERILE IRR 1000ML POUR (IV SOLUTION) ×1 IMPLANT
WEDGE DISTAL 5MM SZ4 (Orthopedic Implant) IMPLANT
WRAPON POLAR PAD KNEE (MISCELLANEOUS) ×4

## 2020-12-24 NOTE — Progress Notes (Signed)
PROGRESS NOTE    [name]  ZHY:[id number] DOB: 12/17/1940 DOA: 12/22/2020 PCP: Jon Billings, NP  Outpatient Specialists: none    Brief Narrative:   [name] is a 80 y.o. female with medical history significant for htn, schizoaffective disorder, who presents after fall from snf. Recent hospitalization for incarcerated inguinal hernia s/p operative repair. Hospital course complicated by cva (watershed infarcts 2/2 postoperative hypotension) and right knee dislocation s/p reduction x2. She presents s/p fall at SNF, found to have recurrent right knee dislocation.   Assessment & Plan:   Principal Problem:   Dislocated knee, right, recurrent, initial encounter Active Problems:   Hypertension   Depression with anxiety   History of CVA (cerebrovascular accident)   UTI (urinary tract infection)   Right knee dislocation, initial encounter   Pressure injury of skin  # Recurrent right knee dislocation S/p revision of right total knee arthroplasty on 10/14 w/ Dr. Harlow Mares.  - maintain knee immobilizer - f/u ortho recs  - PT/OT consults - TOC consult for snf placement  # Pyuria Patient denies suprapubic pain or dysuria but does endorse several months frequency - have ordered fosfomycin x1, f/u culture  # Hx CVA Stable. Recent, possibly 2/2 post-op hypotension (though no sig post-op hypotension documented) - relative permissive htn as below - cont asa, statin  # HTN Here bp mildly elevated - cont home coreg - hold home amlod and lisinopril, will allow permissive htn post-op  # MDD - home sertraline   DVT prophylaxis: lovenox to begin 10/15 Code Status: DNR Family Communication: daughter Caryl Pina updated @ bedside 10/14  Level of care: Med-Surg Status is: Inpatient  Remains inpatient appropriate because:Inpatient level of care appropriate due to severity of illness  Dispo: The patient is from: SNF              Anticipated d/c is to: SNF               Patient currently is not medically stable to d/c.   Difficult to place patient No        Consultants:  orthopedics  Procedures: none  Antimicrobials:  S/p ceftriaxone    Subjective: Seen and evaluated post-op. Very drowsy.  Objective: Vitals:   12/24/20 1550 12/24/20 1555 12/24/20 1600 12/24/20 1605  BP: (!) 145/58  (!) 142/60   Pulse: 77 77 80 82  Resp: 17 17 11 16   Temp: 97.8 F (36.6 C)     TempSrc:      SpO2: 98% 97% 98% 98%  Weight:      Height:        Intake/Output Summary (Last 24 hours) at 12/24/2020 1648 Last data filed at 12/24/2020 1555 Gross per 24 hour  Intake 1100 ml  Output 950 ml  Net 150 ml   Filed Weights   12/24/20 1204  Weight: 74.8 kg    Examination:  General exam: Appears calm and comfortable  Respiratory system: Clear to auscultation. Respiratory effort normal. Cardiovascular system: S1 & S2 heard, RRR. No JVD, murmurs, rubs, gallops or clicks. No pedal edema. Gastrointestinal system: Abdomen is nondistended, soft and nontender. No organomegaly or masses felt. Normal bowel sounds heard. Central nervous system: Alert and oriented. Left sided weakness Extremities: warm, immobilizer on right leg Skin: stage 1 sacral decub Psychiatry: unable to assess    Data Reviewed: I have personally reviewed following labs and imaging studies  CBC: Recent Labs  Lab 12/22/20 1402  WBC 8.3  HGB 11.3*  HCT 34.6*  MCV 89.4  PLT 967   Basic Metabolic Panel: Recent Labs  Lab 12/22/20 1402 12/24/20 0553  NA 134* 134*  K 4.0 3.8  CL 103 102  CO2 20* 24  GLUCOSE 95 86  BUN 17 18  CREATININE 0.57 0.43*  CALCIUM 9.4 9.2   GFR: Estimated Creatinine Clearance: 53.5 mL/min (A) (by C-G formula based on SCr of 0.43 mg/dL (L)). Liver Function Tests: Recent Labs  Lab 12/22/20 1402  AST 20  ALT 14  ALKPHOS 59  BILITOT 0.7  PROT 6.1*  ALBUMIN 3.3*   No results for input(s): LIPASE, AMYLASE in the last 168 hours. No results  for input(s): AMMONIA in the last 168 hours. Coagulation Profile: No results for input(s): INR, PROTIME in the last 168 hours. Cardiac Enzymes: No results for input(s): CKTOTAL, CKMB, CKMBINDEX, TROPONINI in the last 168 hours. BNP (last 3 results) No results for input(s): PROBNP in the last 8760 hours. HbA1C: No results for input(s): HGBA1C in the last 72 hours. CBG: No results for input(s): GLUCAP in the last 168 hours. Lipid Profile: No results for input(s): CHOL, HDL, LDLCALC, TRIG, CHOLHDL, LDLDIRECT in the last 72 hours. Thyroid Function Tests: No results for input(s): TSH, T4TOTAL, FREET4, T3FREE, THYROIDAB in the last 72 hours. Anemia Panel: No results for input(s): VITAMINB12, FOLATE, FERRITIN, TIBC, IRON, RETICCTPCT in the last 72 hours. Urine analysis:    Component Value Date/Time   COLORURINE YELLOW 12/22/2020 1414   APPEARANCEUR CLOUDY (A) 12/22/2020 1414   APPEARANCEUR Cloudy (A) 08/31/2020 1039   LABSPEC 1.014 12/22/2020 1414   PHURINE 6.0 12/22/2020 1414   GLUCOSEU NEGATIVE 12/22/2020 1414   HGBUR SMALL (A) 12/22/2020 1414   BILIRUBINUR NEGATIVE 12/22/2020 1414   BILIRUBINUR Negative 08/31/2020 1039   KETONESUR NEGATIVE 12/22/2020 1414   PROTEINUR NEGATIVE 12/22/2020 1414   UROBILINOGEN 0.2 06/25/2008 1242   NITRITE POSITIVE (A) 12/22/2020 1414   LEUKOCYTESUR LARGE (A) 12/22/2020 1414   Sepsis Labs: @LABRCNTIP (procalcitonin:4,lacticidven:4)  ) Recent Results (from the past 240 hour(s))  Resp Panel by RT-PCR (Flu A&B, Covid) Nasopharyngeal Swab     Status: None   Collection Time: 12/22/20  2:14 PM   Specimen: Nasopharyngeal Swab; Nasopharyngeal(NP) swabs in vial transport medium  Result Value Ref Range Status   SARS Coronavirus 2 by RT PCR NEGATIVE NEGATIVE Final    Comment: (NOTE) SARS-CoV-2 target nucleic acids are NOT DETECTED.  The SARS-CoV-2 RNA is generally detectable in upper respiratory specimens during the acute phase of infection. The  lowest concentration of SARS-CoV-2 viral copies this assay can detect is 138 copies/mL. A negative result does not preclude SARS-Cov-2 infection and should not be used as the sole basis for treatment or other patient management decisions. A negative result may occur with  improper specimen collection/handling, submission of specimen other than nasopharyngeal swab, presence of viral mutation(s) within the areas targeted by this assay, and inadequate number of viral copies(<138 copies/mL). A negative result must be combined with clinical observations, patient history, and epidemiological information. The expected result is Negative.  Fact Sheet for Patients:  EntrepreneurPulse.com.au  Fact Sheet for Healthcare Providers:  IncredibleEmployment.be  This test is no t yet approved or cleared by the [place] FDA and  has been authorized for detection and/or diagnosis of SARS-CoV-2 by FDA under an Emergency Use Authorization (EUA). This EUA will remain  in effect (meaning this test can be used) for the duration of the COVID-19 declaration under Section 564(b)(1) of the Act, 21 U.S.C.section 360bbb-3(b)(1), unless the  authorization is terminated  or revoked sooner.       Influenza A by PCR NEGATIVE NEGATIVE Final   Influenza B by PCR NEGATIVE NEGATIVE Final    Comment: (NOTE) The Xpert Xpress SARS-CoV-2/FLU/RSV plus assay is intended as an aid in the diagnosis of influenza from Nasopharyngeal swab specimens and should not be used as a sole basis for treatment. Nasal washings and aspirates are unacceptable for Xpert Xpress SARS-CoV-2/FLU/RSV testing.  Fact Sheet for Patients: EntrepreneurPulse.com.au  Fact Sheet for Healthcare Providers: IncredibleEmployment.be  This test is not yet approved or cleared by the [place] FDA and has been authorized for detection and/or diagnosis of SARS-CoV-2 by FDA under  an Emergency Use Authorization (EUA). This EUA will remain in effect (meaning this test can be used) for the duration of the COVID-19 declaration under Section 564(b)(1) of the Act, 21 U.S.C. section 360bbb-3(b)(1), unless the authorization is terminated or revoked.  Performed at [facility], [place] [address], [place], [place] [address]          Radiology Studies: DG Wrist 2 Views Left  Result Date: 12/23/2020 CLINICAL DATA:  Left wrist pain after a fall EXAM: LEFT WRIST - 2 VIEW COMPARISON:  None. FINDINGS: Multiple circumscribed cystic bone lesions demonstrated in the base of the first metacarpal bone, in multiple carpal bones, including the hamate, capitate, scaphoid, lunate, and triquetral bones, as well as the distal radius and ulna. These are most likely degenerative cysts. Enchondromas would be a less likely consideration. No expansile changes are identified. There is bone resorption and deformity of the trapezium which is likely degenerative. No evidence of acute fracture or dislocation. Dorsal soft tissue swelling. IMPRESSION: No definite acute fracture or dislocation. Chronic changes as described, likely degenerative. Dorsal soft tissue swelling. Electronically Signed   By: Lucienne Capers M.D.   On: 12/23/2020 20:23   CT Knee Right Wo Contrast  Result Date: 12/22/2020 CLINICAL DATA:  Fracture, knee s/p fall, per ortho. EXAM: CT OF THE RIGHT KNEE WITHOUT CONTRAST TECHNIQUE: Multidetector CT imaging of the RIGHT knee was performed according to the standard protocol. Multiplanar CT image reconstructions were also generated. COMPARISON:  Plain radiographs completed at 11:54 a.m. FINDINGS: Bones/Joint/Cartilage Right total knee arthroplasty has been performed. Streak artifact slightly limits the examination. There is posterior dislocation and mild override of the tibia in relation to the distal femur. No associated fracture. No lytic lesion. Ligaments Suboptimally assessed by CT.  Muscles and Tendons Extensor mechanism appears intact. Mild fatty atrophy diffusely the visualized right lower extremity musculature. Soft tissues Small right knee effusion. Mild prepatellar soft tissue swelling. Vascular calcifications noted within the right lower extremity runoff. Patency of the vasculature is not well assessed on this noncontrast examination. IMPRESSION: Status post right total knee arthroplasty. Posterior right knee dislocation. No associated fracture. Small effusion. Electronically Signed   By: Fidela Salisbury M.D.   On: 12/22/2020 21:09        Scheduled Meds:  aspirin  81 mg Oral Daily   carvedilol  6.25 mg Oral BID WC   gabapentin  100 mg Oral QHS   influenza vaccine adjuvanted  0.5 mL Intramuscular Tomorrow-1000   pneumococcal 23 valent vaccine  0.5 mL Intramuscular Tomorrow-1000   rosuvastatin  10 mg Oral Daily   sertraline  25 mg Oral Daily   Continuous Infusions:     LOS: 2 days    Time spent: 30 min    Desma Maxim, MD [facility]   If 7PM-7AM,  please contact night-coverage [email or web address] Password [facility] 12/24/2020, 4:48 PM

## 2020-12-24 NOTE — Consult Note (Signed)
Neurology Consultation Reason for Consult: Previous postoperative stroke Referring Physician: Magdalene Patricia, J  CC: Fall  History is obtained from: Chart review  HPI: [name] is a 80 y.o. female with a history of schizoaffective disorder as well as hypertension who was recently hospitalized with an incarcerated hernia on 9/22 who initially was reported to do well after surgery, being normal of three 7:30 AM on 9/24.  She was subsequently noticed to be difficult to wake up, and then a code stroke was activated.  She was not a tPA candidate due to recent surgery and CTA demonstrated some M2 stenosis, but otherwise unremarkable.  An MRI revealed diffuse bilateral watershed infarcts, worse on the right presumably due to hypotension given the distribution, but without a clearly documented profound hypotensive episode.    She has recently fallen and re-injured her knee requiring surgical intervention and therefore neurology has been consulted regarding peri-operative stroke risk.    ROS: A 14 point ROS was performed and is negative except as noted in the HPI.  Past Medical History:  Diagnosis Date   Anemia    Colon polyp    History of small bowel obstruction 9/09   likely due to adhesions- pt refused most dx and tx in hospital   History of vaginal bleeding    post menopausal- gyn eval, ? polyp   Hyperlipidemia    Osteoarthritis    knee   Schizoaffective disorder    paranoid personality- refuses treatment   Vitamin D deficiency      Family History  Problem Relation Age of Onset   Heart disease Mother    Diabetes Mother    Skin cancer Mother    Diabetes Father    Heart disease Father    Hypertension Father    Diabetes Brother    Arthritis Brother    Hypertension Brother    Hyperlipidemia Brother    Arthritis Daughter    Cancer Son    Obesity Daughter      Social History:  reports that she quit smoking about 38 years ago. Her smoking use included cigarettes. She  has a 10.00 pack-year smoking history. She has never used smokeless tobacco. She reports that she does not drink alcohol and does not use drugs.   Exam: Current vital signs: BP (!) 142/56 (BP Location: Left Arm)   Pulse 74   Temp 98.4 F (36.9 C)   Resp 16   SpO2 97%  Vital signs in last 24 hours: Temp:  [97.7 F (36.5 C)-98.4 F (36.9 C)] 98.2 F (36.8 C) (10/14 0727) Pulse Rate:  [67-74] 72 (10/14 0727) Resp:  [15-20] 15 (10/14 0727) BP: (117-153)/(51-58) 153/53 (10/14 0727) SpO2:  [96 %-100 %] 97 % (10/14 0727)   Physical Exam  Constitutional: Appears well-developed and well-nourished.  Psych: Affect appropriate to situation Eyes: No scleral injection HENT: No OP obstruction MSK: no joint deformities.  Cardiovascular: Normal rate and regular rhythm.  Respiratory: Effort normal, non-labored breathing GI: Soft.  No distension. There is no tenderness.  Skin: WDI  Neuro: Mental Status: Patient is awake, alert, oriented to person,  month, year, but gives location as [place] No signs of aphasia  Cranial Nerves: II: Visual Fields are full. Pupils are very mildly unequal L larger than right.  III,IV, VI: EOMI without ptosis or diploplia.  V: Facial sensation is symmetric to temperature VII: Facial movement is symmetric.  VIII: hearing is intact to voice X: Uvula elevates symmetrically XI: Shoulder shrug is symmetric. XII: tongue  is midline without atrophy or fasciculations.  Motor: She has 4/5 strenght in the right arm, limited in right leg due to recent injury, 3/5 in left arm and leg.  Sensory: Sensation is symmetric to light touch and temperature in the arms and legs. Cerebellar: Consistent with weakness.       I have reviewed labs in epic and the results pertinent to this consultation are:  Na 134  I have reviewed the images obtained:CTA - intracranial M2 stenosis, MRI brain from 09/24 - significant bilateral watershed infarcts  Impression: 80 yo F with  recent bilateral watershed infarcts from hypotension with her previous hospitalization who has indication for surgical correctionof her right knee.  Studies that have evaluated perioperative stroke risk in patients with recent stroke have not focused on provoked stroke through hypertension, therefore I am not certain how we can best interpret those studies.  With symptomatic intracranial stenosis, there has been a limited study of 38 patient suggesting a perioperative stroke risk of 6%.  That being said, I am not sure if her stenosis would be symptomatic outside the setting of hypotensive episode.  I am not sure what the etiology for her hypotension previously, and it is interesting that it was never clearly documented below the 90 systolic which I would not expect to cause the imaging findings that were found.  I suspect she did have a brief period of time with significantly more hypotension before being discovered and stimulated to a point where her BP raised back some before it was checked.  I would typically recommend waiting for elective procedures for 6 to 9 months, but I do worry that this might significantly impair her ability to rehab both from her knee injury as well as from her stroke leading to further decline in this older patient.  Recommendations: 1) if her surgery would significantly improve her quality of life, then I think it could be considered with the understanding that there is some risk associated with this. 2) given the etiology of her previous stroke, I do not think any particular pharmacotherapy protective measures are particularly necessary, though with her intracranial stenosis I would prefer at least a baby aspirin once not contraindicated from a surgical perspective 3) if surgery is pursued, then I would favor close postoperative blood pressure management with avoidance of hypotension.   Roland Rack, MD [facility] [phone]  If 7pm- 7am, please  page neurology on call as listed in [place].

## 2020-12-24 NOTE — Anesthesia Preprocedure Evaluation (Signed)
Anesthesia Evaluation  Patient identified by MRN, date of birth, ID band Patient awake    Reviewed: Allergy & Precautions, NPO status , Patient's Chart, lab work & pertinent test results  History of Anesthesia Complications (+) history of anesthetic complications  Airway Mallampati: III  TM Distance: <3 FB Neck ROM: limited    Dental  (+) Chipped, Poor Dentition, Missing   Pulmonary neg shortness of breath, former smoker,    Pulmonary exam normal        Cardiovascular hypertension, Normal cardiovascular exam     Neuro/Psych PSYCHIATRIC DISORDERS CVA, Residual Symptoms    GI/Hepatic negative GI ROS, Neg liver ROS,   Endo/Other  negative endocrine ROS  Renal/GU      Musculoskeletal  (+) Arthritis ,   Abdominal   Peds  Hematology negative hematology ROS (+)   Anesthesia Other Findings Past Medical History: No date: Anemia No date: Colon polyp 9/09: History of small bowel obstruction     Comment:  likely due to adhesions- pt refused most dx and tx in               hospital No date: History of vaginal bleeding     Comment:  post menopausal- gyn eval, ? polyp No date: Hyperlipidemia No date: Osteoarthritis     Comment:  knee No date: Schizoaffective disorder     Comment:  paranoid personality- refuses treatment No date: Vitamin D deficiency  Past Surgical History: 04/23/2012: ANTERIOR AND POSTERIOR REPAIR; N/A     Comment:  Procedure: ANTERIOR   REPAIR CYSTOCELE;  Surgeon: Reece Packer, MD;  Location: [facility];  Service: Urology;                Laterality: N/A;  cysto;graft 10x6 No date: APPENDECTOMY 1976: bladder tack 12/02/2020: BOWEL RESECTION; N/A     Comment:  Procedure: SMALL BOWEL RESECTION;  Surgeon: Benjamine Sprague, DO;  Location: [facility];  Service: General;                Laterality: N/A; 12/02/2020: INGUINAL HERNIA REPAIR; Right     Comment:  Procedure: HERNIA REPAIR  INGUINAL ADULT;  Surgeon:               Benjamine Sprague, DO;  Location: [facility];  Service: General;              Laterality: Right; No date: KNEE ARTHROSCOPY     Comment:  right No date: KNEE ARTHROSCOPY; Right 12/07/2020: KNEE CLOSED REDUCTION; Right     Comment:  Procedure: CLOSED MANIPULATION KNEE;  Surgeon: Lovell Sheehan, MD;  Location: [facility];  Service: Orthopedics;               Laterality: Right; 1970's: LAPAROSCOPY     Comment:  twisted fallopian tube 04/23/2012: SALPINGOOPHORECTOMY; Bilateral     Comment:  Procedure: SALPINGO OOPHORECTOMY;  Surgeon: Emily Filbert,              MD;  Location: [facility];  Service: Gynecology;  Laterality:              Bilateral; No date: TONSILLECTOMY 04/23/2012: VAGINAL HYSTERECTOMY; N/A     Comment:  Procedure: HYSTERECTOMY VAGINAL;  Surgeon: Emily Filbert,  MD;  Location: [facility];  Service: Gynecology;  Laterality:              N/A; 04/23/2012: VAGINAL PROLAPSE REPAIR; N/A     Comment:  Procedure: VAGINAL VAULT SUSPENSION;  Surgeon: Reece Packer, MD;  Location: [facility];  Service: Urology;                Laterality: N/A;     Reproductive/Obstetrics negative OB ROS                             Anesthesia Physical Anesthesia Plan  ASA: 4  Anesthesia Plan: General ETT   Post-op Pain Management:    Induction: Intravenous  PONV Risk Score and Plan: Ondansetron, Dexamethasone, Midazolam and Treatment may vary due to age or medical condition  Airway Management Planned: Oral ETT  Additional Equipment:   Intra-op Plan:   Post-operative Plan: Extubation in OR  Informed Consent: I have reviewed the patients History and Physical, chart, labs and discussed the procedure including the risks, benefits and alternatives for the proposed anesthesia with the patient or authorized representative who has indicated his/her understanding and acceptance.   Patient has DNR.  Discussed  DNR with power of attorney, Suspend DNR and Discussed DNR with patient.   Dental Advisory Given  Plan Discussed with: Anesthesiologist, CRNA and Surgeon  Anesthesia Plan Comments: (Patient with recent Post Op stroke.  Patient cleared by Neurology.  Thorough discussion with the patients daughter that the patient is high risk for new stroke or worsening of her old stroke with [place] proceeding with an anesthetic this close to her previous stroke.  Daughter voiced understanding.  Patient and daughter Caryl Pina Haynes)consented for risks of anesthesia including but not limited to:  - adverse reactions to medications - damage to eyes, teeth, lips or other oral mucosa - nerve damage due to positioning  - sore throat or hoarseness - Damage to heart, brain (stroke), nerves, lungs, other parts of body or loss of life  They voiced understanding.)        Anesthesia Quick Evaluation

## 2020-12-24 NOTE — Plan of Care (Signed)

## 2020-12-24 NOTE — H&P (Signed)
The patient has been re-examined, and the chart reviewed, and there have been no interval changes to the documented history and physical.  Plan a right knee revision today.  Anesthesia is consulted regarding a peripheral nerve block for post-operative pain.  The risks, benefits, and alternatives have been discussed at length, and the patient and her daughter are willing to proceed.

## 2020-12-24 NOTE — Transfer of Care (Signed)
Immediate Anesthesia Transfer of Care Note  Patient: [name]  Procedure(s) Performed: TOTAL KNEE REVISION (Right: Knee)  Patient Location: PACU  Anesthesia Type:General  Level of Consciousness: drowsy  Airway & Oxygen Therapy: Patient Spontanous Breathing and Patient connected to face mask oxygen  Post-op Assessment: Report given to RN and Post -op Vital signs reviewed and stable  Post vital signs: Reviewed and stable  Last Vitals:  Vitals Value Taken Time  BP 158/49 12/24/20 1510  Temp 36.8 C 12/24/20 1510  Pulse 76 12/24/20 1517  Resp 20 12/24/20 1517  SpO2 100 % 12/24/20 1517  Vitals shown include unvalidated device data.  Last Pain:  Vitals:   12/24/20 1204  TempSrc: Tympanic  PainSc:       Patients Stated Pain Goal: 2 (05/10/38 6986)  Complications: No notable events documented.

## 2020-12-24 NOTE — Op Note (Signed)
DATE OF SURGERY:  12/22/2020 - 12/24/2020 TIME: 2:56 PM  PATIENT NAME:  [name]   AGE: 80 y.o.    PRE-OPERATIVE DIAGNOSIS:  Right Total Knee Instability  POST-OPERATIVE DIAGNOSIS:  Same  PROCEDURE:  Procedure(s): TOTAL KNEE REVISION, RIGHT, femoral and tibial  SURGEON:  Lovell Sheehan, MD   ASSISTANT:  Carlynn Spry,  PA-C  OPERATIVE IMPLANTS: Legion oxinium size 4 right constrained femoral component with 18 mm x 160 mm pressfit stem and 5 mm lateral distal augment, size 4 constrained articular insert   PREOPERATIVE INDICATIONS:  [name] is an 80 y.o. female who has a diagnosis of Right Knee instability with multiple dislocation events and elected for a total knee revision arthroplasty after failing nonoperative treatment, including activity modification, and bracing who has significant impairment of their activities of daily living.    The risks, benefits, and alternatives were discussed at length including but not limited to the risks of infection, bleeding, nerve or blood vessel injury, knee stiffness, fracture, dislocation, loosening or failure of the hardware and the need for further surgery. Medical risks include but not limited to DVT and pulmonary embolism, myocardial infarction, stroke, pneumonia, respiratory failure and death. I discussed these risks with the patient in my office prior to the date of surgery. They understood these risks and were willing to proceed.  OPERATIVE FINDINGS AND UNIQUE ASPECTS OF THE CASE:  anterior/posterior instability with intact collateral ligaments. There was evidence of acute rupture of the PCL and posterior capsule. The components were well fixed.   OPERATIVE DESCRIPTION:  The patient was brought to the operative room and placed in a supine position after undergoing placement of a general anesthetic. IV antibiotics were given. Patient received tranexamic acid. The lower extremity was prepped and draped in the  usual sterile fashion.  A time out was performed to verify the patient's name, date of birth, medical record number, correct site of surgery and correct procedure to be performed. The timeout was also used to confirm the patient received antibiotics and that appropriate instruments, implants and radiographs studies were available in the room.  The leg was elevated and exsanguinated with an Esmarch and the tourniquet was inflated to 275 mmHg.  A midline incision was made over the left knee.. A medial parapatellar arthrotomy was then made and the patella subluxed laterally and the knee was brought into 90 of flexion. Scar tissue within the medial and lateral gutters was excised. The insert was removed and using thin osteotomes the cruciate retaining femoral component was freed and removed with a  bone tamp. There was minimal bone loss  The femur was sequentially reamed to a size 18 mm. The distal femur was resected using the intramedullary cutting guide.  Care was taken to protect the collateral ligaments during distal femoral resection.  The distal femoral resection was performed with an oscillating saw. An additional 5 mm was removed from the distal lateral aspect. The femoral cutting guide was then removed. Extension gap was measured with a 13 mm spacer block and alignment and extension was confirmed using a long alignment rod. The femur was sized to be a 4. Rotation of the referencing guide was checked with the epicondylar axis. Then the 4-in-1 cutting jig was then applied to the distal femur. Then the anterior, posterior and chamfer femoral cuts were then made with an oscillating saw.  The box cutting guide was then placed and resected. The knee was distracted and all excess capsule and PCL tissue  were removed.  The flexion gap was then measured with a flexion spacer block and long alignment rod and was found to be symmetric with the extension gap and perpendicular to mechanical axis of the tibia.  All  trial components were inserted with polyethylene trials. The knee achieved full extension and flexed to 120 degrees. Ligament were stable to varus and valgus at full extension as well as 30, 60 and 90 degrees of flexion. There was no anterior/posterior instability.  The final total knee arthroplasty components were then cemented into place. The knee was held in extension while cement was allowed to cure.The knee was taken through a range of motion and the patella tracked well and the knee was again irrigated copiously.  The knee capsule was then injected with Exparel.  The medial arthrotomy was closed with #1 Vicryl and #2 Quill. The subcutaneous tissue closed with  2-0 vicryl, and skin approximated with staples.  A dry sterile and compressive dressing was applied.  A Polar Care was applied to the operative knee. A knee immobilizer was applied.  The patient was awakened and brought to the PACU in stable and satisfactory condition.  All sharp, lap and instrument counts were correct at the conclusion the case. I spoke with the patient's family in the postop consultation room to let them know the case had been performed without complication and the patient was stable in recovery room.   Total tourniquet time was 67 minutes.

## 2020-12-25 DIAGNOSIS — S83104A Unspecified dislocation of right knee, initial encounter: Secondary | ICD-10-CM | POA: Diagnosis not present

## 2020-12-25 LAB — BASIC METABOLIC PANEL
Anion gap: 9 (ref 5–15)
BUN: 27 mg/dL — ABNORMAL HIGH (ref 8–23)
CO2: 23 mmol/L (ref 22–32)
Calcium: 9.3 mg/dL (ref 8.9–10.3)
Chloride: 102 mmol/L (ref 98–111)
Creatinine, Ser: 0.63 mg/dL (ref 0.44–1.00)
GFR, Estimated: >60 mL/min (ref >60)
Glucose, Bld: 208 mg/dL — ABNORMAL HIGH (ref 70–99)
Potassium: 4.1 mmol/L (ref 3.5–5.1)
Sodium: 134 mmol/L — ABNORMAL LOW (ref 135–145)

## 2020-12-25 LAB — CBC
HCT: 31.8 % — ABNORMAL LOW (ref 36.0–46.0)
Hemoglobin: 10.6 g/dL — ABNORMAL LOW (ref 12.0–15.0)
MCH: 29.1 pg (ref 26.0–34.0)
MCHC: 33.3 g/dL (ref 30.0–36.0)
MCV: 87.4 fL (ref 80.0–100.0)
Platelets: 297 10*3/uL (ref 150–400)
RBC: 3.64 MIL/uL — ABNORMAL LOW (ref 3.87–5.11)
RDW: 13.1 % (ref 11.5–15.5)
WBC: 10.2 10*3/uL (ref 4.0–10.5)
nRBC: 0 % (ref 0.0–0.2)

## 2020-12-25 MED ORDER — POLYETHYLENE GLYCOL 3350 17 G PO PACK
17.0000 g | PACK | Freq: Every day | ORAL | Status: DC
  Administered 2020-12-25 – 2020-12-27 (×3): 17 g via ORAL
  Filled 2020-12-25 (×3): qty 1

## 2020-12-25 MED ORDER — HYDROCODONE-ACETAMINOPHEN 5-325 MG PO TABS
1.0000 | ORAL_TABLET | ORAL | Status: DC | PRN
  Administered 2020-12-26 (×2): 1 via ORAL
  Filled 2020-12-25 (×2): qty 1

## 2020-12-25 MED ORDER — HYDROMORPHONE HCL 1 MG/ML IJ SOLN: 1.0000 mg | INTRAMUSCULAR | Status: DC | PRN

## 2020-12-25 MED ORDER — SENNA 8.6 MG PO TABS
1.0000 | ORAL_TABLET | Freq: Every day | ORAL | Status: DC
  Administered 2020-12-25 – 2020-12-27 (×3): 8.6 mg via ORAL
  Filled 2020-12-25 (×3): qty 1

## 2020-12-25 NOTE — Evaluation (Signed)
Occupational Therapy Evaluation Patient Details Name: [name] MRN: [id number] DOB: 1941/01/25 Today's Date: 12/25/2020   History of Present Illness Pt is an 80 y.o. female with medical history significant for HTN, depression, and anxiety recently hospitalized from 9/38-10/3 for strangulated hernia undergoing small bowel resection on 9/22, with visit complicated by bilateral cerebral hemispheric watershed infarcts related to postop hypotension, further complicated by a right knee dislocation s/p reduction on 9/27 and again on 10/1 who was transferred from the ED at [facility] due to recurrent right knee dislocation following a fall out of bed several days prior at the rehab facility.  Patient was also incidentally found to have a UTI prior to transfer. Pt now s/p R TKA revision.   Clinical Impression   [name] presents today with significantly reduced fxl mobility, given decreased use of BLE/BUE s/p Sept '22 watershed infarcts related to postop hypotension. Now 1 day post R TKA, pt reports 3/10 R knee pain and requires Max A for bed mobility; is unable to come into standing. She requires Max A for self-feeding and UB bathing, and Total A for LB dressing. She is intermittently attentive and oriented, able to follow 1-step directions with increased time. Recommend ongoing OT while pt is hospitalized, AE for self-feeding, and DC to SNF to continue rehabilitation.     Recommendations for follow up therapy are one component of a multi-disciplinary discharge planning process, led by the attending physician.  Recommendations may be updated based on patient status, additional functional criteria and insurance authorization.   Follow Up Recommendations  SNF    Equipment Recommendations  Other (comment) (built-up utensils)    Recommendations for Other Services       Precautions / Restrictions Precautions Precautions: Fall Required Braces or Orthoses: Knee Immobilizer - Right Knee  Immobilizer - Right: On at all times Restrictions Weight Bearing Restrictions: Yes RLE Weight Bearing: Weight bearing as tolerated Other Position/Activity Restrictions: Confirmed with Dr. Mack Guise on 12/25/20 that pt is to have KI on at all times until ordered otherwise      Mobility Bed Mobility Overal bed mobility: Needs Assistance Bed Mobility: Supine to Sit;Sit to Supine Rolling: Mod assist   Supine to sit: Max assist Sit to supine: Max assist   General bed mobility comments: Max A for BLE and trunk control during sup to/from sit    Transfers                 General transfer comment: Unable    Balance Overall balance assessment: Needs assistance Sitting-balance support: Feet unsupported;Bilateral upper extremity supported;Single extremity supported;No upper extremity supported Sitting balance-Leahy Scale: Good Sitting balance - Comments: With cueing, pt able to maintain good seated balance EOB w/o UE support     Standing balance-Leahy Scale: Zero                             ADL either performed or assessed with clinical judgement   ADL Overall ADL's : Needs assistance/impaired Eating/Feeding: Maximal assistance Eating/Feeding Details (indicate cue type and reason): Pt unable to open packets, difficulty holding utensils, difficulty bring utensils to mouth. Would benefit from built-up utensils Grooming: Wash/dry face;Moderate assistance               Lower Body Dressing: Total assistance Lower Body Dressing Details (indicate cue type and reason): donning/doffing socks             Functional mobility during ADLs: Moderate assistance  Vision         Perception     Praxis      Pertinent Vitals/Pain Pain Assessment: 0-10 Pain Score: 3  Pain Location: L knee Pain Descriptors / Indicators: Grimacing;Discomfort;Guarding Pain Intervention(s): Limited activity within patient's tolerance;Repositioned;Ice applied;Monitored during  session     Hand Dominance Right   Extremity/Trunk Assessment Upper Extremity Assessment Upper Extremity Assessment: Generalized weakness;LUE deficits/detail;RUE deficits/detail RUE Deficits / Details: reduced strength, ROM; impaired sensation b/l UE LUE Deficits / Details: reduced strength, ROM; impaired sensation b/l UE   Lower Extremity Assessment Lower Extremity Assessment: RLE deficits/detail RLE Deficits / Details: S/p TKA. RLE to remain WBAT with KI donned at all times. RLE: Unable to fully assess due to immobilization       Communication Communication Communication: No difficulties   Cognition Arousal/Alertness: Awake/alert Behavior During Therapy: WFL for tasks assessed/performed Overall Cognitive Status: No family/caregiver present to determine baseline cognitive functioning                         Following Commands: Follows one step commands with increased time     Problem Solving: Slow processing;Difficulty sequencing;Requires tactile cues;Requires verbal cues;Decreased initiation General Comments: Oriented to self, place, able to identify month but cannot state year   General Comments       Exercises Total Joint Exercises Ankle Circles/Pumps: AROM;Strengthening;Both;10 reps Hip ABduction/ADduction: AAROM;Strengthening;Both;10 reps Straight Leg Raises: 10 reps;Both;Strengthening;AAROM Long Arc Quad: AROM;Strengthening;Left;10 reps Knee Flexion: AROM;Strengthening;Left;10 reps Other Exercises Other Exercises: Educ re: AE for feeding   Shoulder Instructions      Home Living Family/patient expects to be discharged to:: Private residence Living Arrangements: Alone   Type of Home: House Home Access: Level entry     Home Layout: One level               Home Equipment: Gilford Rile - 2 wheels   Additional Comments: Pt lived alone in single story home prior to hospitalization in Sept 2022. Since Sept '22 hospital DC, pt has been living at a  SNF      Prior Functioning/Environment Level of Independence: Independent with assistive device(s)        Comments: Prior to Sept hospital admission, pt was IND in ADL, IADL, driving, active.        OT Problem List: Decreased strength;Decreased coordination;Decreased range of motion;Decreased cognition;Decreased activity tolerance;Decreased safety awareness;Impaired balance (sitting and/or standing);Decreased knowledge of use of DME or AE;Impaired UE functional use;Impaired vision/perception;Impaired tone;Impaired sensation;Pain      OT Treatment/Interventions: Self-care/ADL training;DME and/or AE instruction;Therapeutic activities;Balance training;Therapeutic exercise;Energy conservation;Patient/family education    OT Goals(Current goals can be found in the care plan section) Acute Rehab OT Goals Patient Stated Goal: to feed myself OT Goal Formulation: With patient Time For Goal Achievement: 01/08/21 Potential to Achieve Goals: Good ADL Goals Pt Will Perform Eating: with modified independence;with adaptive utensils;sitting Pt Will Perform Lower Body Dressing: with min assist;sitting/lateral leans Pt Will Transfer to Toilet: with min assist (using LRAD)  OT Frequency: Min 1X/week   Barriers to D/C:            Co-evaluation              AM-PAC OT "6 Clicks" Daily Activity     Outcome Measure Help from another person eating meals?: A Lot Help from another person taking care of personal grooming?: A Lot Help from another person toileting, which includes using toliet, bedpan, or urinal?: Total Help from another person bathing (  including washing, rinsing, drying)?: A Lot Help from another person to put on and taking off regular upper body clothing?: A Lot Help from another person to put on and taking off regular lower body clothing?: Total 6 Click Score: 10   End of Session Nurse Communication: Mobility status  Activity Tolerance: Patient tolerated treatment  well Patient left: in bed;with call bell/phone within reach;with bed alarm set  OT Visit Diagnosis: Other abnormalities of gait and mobility (R26.89);Muscle weakness (generalized) (M62.81);Hemiplegia and hemiparesis Hemiplegia - dominant/non-dominant: Dominant;Non-Dominant Hemiplegia - caused by: Cerebral infarction                Time: 2353-6144 OT Time Calculation (min): 41 min Charges:  OT General Charges $OT Visit: 1 Visit OT Evaluation $OT Eval Moderate Complexity: 1 Mod OT Treatments $Self Care/Home Management : 38-52 mins Josiah Lobo, PhD, MS, OTR/L 12/25/20, 3:10 PM

## 2020-12-25 NOTE — Progress Notes (Signed)
Vitals entered manually- patient ID not found on dynamap

## 2020-12-25 NOTE — Progress Notes (Addendum)
PROGRESS NOTE    [name]  VQQ:[id number] DOB: Jan 31, 1941 DOA: 12/22/2020 PCP: Jon Billings, NP  Outpatient Specialists: none    Brief Narrative:   [name] is a 80 y.o. female with medical history significant for htn, schizoaffective disorder, who presents after fall from snf. Recent hospitalization for incarcerated inguinal hernia s/p operative repair. Hospital course complicated by cva (watershed infarcts 2/2 postoperative hypotension) and right knee dislocation s/p reduction x2. She presents s/p fall at SNF, found to have recurrent right knee dislocation.   Assessment & Plan:   Principal Problem:   Dislocated knee, right, recurrent, initial encounter Active Problems:   Hypertension   Depression with anxiety   History of CVA (cerebrovascular accident)   UTI (urinary tract infection)   Right knee dislocation, initial encounter   Pressure injury of skin  # Recurrent right knee dislocation S/p revision of right total knee arthroplasty on 10/14 w/ Dr. Harlow Mares.  - maintain knee immobilizer - hydrocodone/dilaudid for pain; miralax/senna for ppx - f/u ortho recs  - PT/OT consults - TOC consult for snf placement  # Pyuria Patient denies suprapubic pain or dysuria but does endorse several months frequency. Urine culture ordered on 10/13 but lab says that wasn't performed and now no sample to test. Was treated w/ fosfomycin x1. No new uti symptoms - monitor   # Hx CVA Stable. Recent, possibly 2/2 post-op hypotension (though no sig post-op hypotension documented) - relative permissive htn as below - cont asa, statin  # HTN Here bp wnl - cont home coreg - hold home amlod and lisinopril, will allow permissive htn post-op  # MDD - home sertraline   DVT prophylaxis: lovenox  Code Status: DNR Family Communication: daughter Caryl Pina updated @ bedside 10/15  Level of care: Med-Surg Status is: Inpatient  Remains inpatient appropriate  because:Inpatient level of care appropriate due to severity of illness  Dispo: The patient is from: SNF              Anticipated d/c is to: SNF              Patient currently is not medically stable to d/c.   Difficult to place patient No        Consultants:  orthopedics  Procedures: none  Antimicrobials:  S/p ceftriaxone and fosfomycin   Subjective: Complains of moderate knee pain.  Objective: Vitals:   12/24/20 1605 12/24/20 2003 12/25/20 0407 12/25/20 0823  BP:  (!) 126/48 (!) 132/54 (!) 129/52  Pulse: 82 76 77 75  Resp: 16 18 16 16   Temp:  98.7 F (37.1 C) 98 F (36.7 C) 98.2 F (36.8 C)  TempSrc:      SpO2: 98% 94% 95% 97%  Weight:      Height:        Intake/Output Summary (Last 24 hours) at 12/25/2020 1303 Last data filed at 12/25/2020 1017 Gross per 24 hour  Intake 2207.71 ml  Output 900 ml  Net 1307.71 ml   Filed Weights   12/24/20 1204  Weight: 74.8 kg    Examination:  General exam: Appears calm and in some pain Respiratory system: Clear to auscultation. Respiratory effort normal. Cardiovascular system: S1 & S2 heard, RRR. No JVD, murmurs, rubs, gallops or clicks. No pedal edema. Gastrointestinal system: Abdomen is nondistended, soft and nontender. No organomegaly or masses felt. Normal bowel sounds heard. Central nervous system: Alert and oriented. Left sided weakness Extremities: warm, immobilizer on right leg Skin: stage 1 sacral decub Psychiatry: calm  Data Reviewed: I have personally reviewed following labs and imaging studies  CBC: Recent Labs  Lab 12/22/20 1402 12/25/20 0414  WBC 8.3 10.2  HGB 11.3* 10.6*  HCT 34.6* 31.8*  MCV 89.4 87.4  PLT 395 259   Basic Metabolic Panel: Recent Labs  Lab 12/22/20 1402 12/24/20 0553 12/25/20 0414  NA 134* 134* 134*  K 4.0 3.8 4.1  CL 103 102 102  CO2 20* 24 23  GLUCOSE 95 86 208*  BUN 17 18 27*  CREATININE 0.57 0.43* 0.63  CALCIUM 9.4 9.2 9.3   GFR: Estimated  Creatinine Clearance: 53.5 mL/min (by C-G formula based on SCr of 0.63 mg/dL). Liver Function Tests: Recent Labs  Lab 12/22/20 1402  AST 20  ALT 14  ALKPHOS 59  BILITOT 0.7  PROT 6.1*  ALBUMIN 3.3*   No results for input(s): LIPASE, AMYLASE in the last 168 hours. No results for input(s): AMMONIA in the last 168 hours. Coagulation Profile: No results for input(s): INR, PROTIME in the last 168 hours. Cardiac Enzymes: No results for input(s): CKTOTAL, CKMB, CKMBINDEX, TROPONINI in the last 168 hours. BNP (last 3 results) No results for input(s): PROBNP in the last 8760 hours. HbA1C: No results for input(s): HGBA1C in the last 72 hours. CBG: No results for input(s): GLUCAP in the last 168 hours. Lipid Profile: No results for input(s): CHOL, HDL, LDLCALC, TRIG, CHOLHDL, LDLDIRECT in the last 72 hours. Thyroid Function Tests: No results for input(s): TSH, T4TOTAL, FREET4, T3FREE, THYROIDAB in the last 72 hours. Anemia Panel: No results for input(s): VITAMINB12, FOLATE, FERRITIN, TIBC, IRON, RETICCTPCT in the last 72 hours. Urine analysis:    Component Value Date/Time   COLORURINE YELLOW 12/22/2020 1414   APPEARANCEUR CLOUDY (A) 12/22/2020 1414   APPEARANCEUR Cloudy (A) 08/31/2020 1039   LABSPEC 1.014 12/22/2020 1414   PHURINE 6.0 12/22/2020 1414   GLUCOSEU NEGATIVE 12/22/2020 1414   HGBUR SMALL (A) 12/22/2020 1414   BILIRUBINUR NEGATIVE 12/22/2020 1414   BILIRUBINUR Negative 08/31/2020 1039   KETONESUR NEGATIVE 12/22/2020 1414   PROTEINUR NEGATIVE 12/22/2020 1414   UROBILINOGEN 0.2 06/25/2008 1242   NITRITE POSITIVE (A) 12/22/2020 1414   LEUKOCYTESUR LARGE (A) 12/22/2020 1414   Sepsis Labs: @LABRCNTIP (procalcitonin:4,lacticidven:4)  ) Recent Results (from the past 240 hour(s))  Resp Panel by RT-PCR (Flu A&B, Covid) Nasopharyngeal Swab     Status: None   Collection Time: 12/22/20  2:14 PM   Specimen: Nasopharyngeal Swab; Nasopharyngeal(NP) swabs in vial transport  medium  Result Value Ref Range Status   SARS Coronavirus 2 by RT PCR NEGATIVE NEGATIVE Final    Comment: (NOTE) SARS-CoV-2 target nucleic acids are NOT DETECTED.  The SARS-CoV-2 RNA is generally detectable in upper respiratory specimens during the acute phase of infection. The lowest concentration of SARS-CoV-2 viral copies this assay can detect is 138 copies/mL. A negative result does not preclude SARS-Cov-2 infection and should not be used as the sole basis for treatment or other patient management decisions. A negative result may occur with  improper specimen collection/handling, submission of specimen other than nasopharyngeal swab, presence of viral mutation(s) within the areas targeted by this assay, and inadequate number of viral copies(<138 copies/mL). A negative result must be combined with clinical observations, patient history, and epidemiological information. The expected result is Negative.  Fact Sheet for Patients:  EntrepreneurPulse.com.au  Fact Sheet for Healthcare Providers:  IncredibleEmployment.be  This test is no t yet approved or cleared by the [place] and  has been authorized for  detection and/or diagnosis of SARS-CoV-2 by FDA under an Emergency Use Authorization (EUA). This EUA will remain  in effect (meaning this test can be used) for the duration of the COVID-19 declaration under Section 564(b)(1) of the Act, 21 U.S.C.section 360bbb-3(b)(1), unless the authorization is terminated  or revoked sooner.       Influenza A by PCR NEGATIVE NEGATIVE Final   Influenza B by PCR NEGATIVE NEGATIVE Final    Comment: (NOTE) The Xpert Xpress SARS-CoV-2/FLU/RSV plus assay is intended as an aid in the diagnosis of influenza from Nasopharyngeal swab specimens and should not be used as a sole basis for treatment. Nasal washings and aspirates are unacceptable for Xpert Xpress SARS-CoV-2/FLU/RSV testing.  Fact Sheet for  Patients: EntrepreneurPulse.com.au  Fact Sheet for Healthcare Providers: IncredibleEmployment.be  This test is not yet approved or cleared by the [place] FDA and has been authorized for detection and/or diagnosis of SARS-CoV-2 by FDA under an Emergency Use Authorization (EUA). This EUA will remain in effect (meaning this test can be used) for the duration of the COVID-19 declaration under Section 564(b)(1) of the Act, 21 U.S.C. section 360bbb-3(b)(1), unless the authorization is terminated or revoked.  Performed at [facility], [place] [address] [place], [place] [address]          Radiology Studies: DG Wrist 2 Views Left  Result Date: 12/23/2020 CLINICAL DATA:  Left wrist pain after a fall EXAM: LEFT WRIST - 2 VIEW COMPARISON:  None. FINDINGS: Multiple circumscribed cystic bone lesions demonstrated in the base of the first metacarpal bone, in multiple carpal bones, including the hamate, capitate, scaphoid, lunate, and triquetral bones, as well as the distal radius and ulna. These are most likely degenerative cysts. Enchondromas would be a less likely consideration. No expansile changes are identified. There is bone resorption and deformity of the trapezium which is likely degenerative. No evidence of acute fracture or dislocation. Dorsal soft tissue swelling. IMPRESSION: No definite acute fracture or dislocation. Chronic changes as described, likely degenerative. Dorsal soft tissue swelling. Electronically Signed   By: Lucienne Capers M.D.   On: 12/23/2020 20:23   DG Knee Right Port  Result Date: 12/24/2020 CLINICAL DATA:  Post revision of knee replacement EXAM: PORTABLE RIGHT KNEE - 1-2 VIEW COMPARISON:  12/22/2020 FINDINGS: Post revision right total knee arthroplasty with long-stem femoral component. Postoperative soft tissue swelling and air. No evidence of complication. IMPRESSION: Post revision right total knee arthroplasty.  Electronically Signed   By: Macy Mis M.D.   On: 12/24/2020 17:23        Scheduled Meds:  aspirin  81 mg Oral Daily   carvedilol  6.25 mg Oral BID WC   docusate sodium  100 mg Oral BID   enoxaparin (LOVENOX) injection  40 mg Subcutaneous Q24H   gabapentin  100 mg Oral QHS   influenza vaccine adjuvanted  0.5 mL Intramuscular Tomorrow-1000   pneumococcal 23 valent vaccine  0.5 mL Intramuscular Tomorrow-1000   rosuvastatin  10 mg Oral Daily   sertraline  25 mg Oral Daily   Continuous Infusions:  lactated ringers 75 mL/hr at 12/25/20 0646      LOS: 3 days    Time spent: 30 min    Desma Maxim, MD [facility]   If 7PM-7AM, please contact night-coverage [email or web address] Password [email or web address] 12/25/2020, 1:03 PM

## 2020-12-25 NOTE — Progress Notes (Signed)
Vitals entered manually- dynamap wouldn't scan pt wristband

## 2020-12-25 NOTE — Progress Notes (Signed)
Subjective:  POD #1 s/p right total knee arthroplasty revision.   Patient reports right knee pain as moderate.    Objective:   VITALS:   Vitals:   12/24/20 1600 12/24/20 1605 12/24/20 2003 12/25/20 0407  BP: (!) 142/60  (!) 126/48 (!) 132/54  Pulse: 80 82 76 77  Resp: 11 16 18 16   Temp:   98.7 F (37.1 C) 98 F (36.7 C)  TempSrc:      SpO2: 98% 98% 94% 95%  Weight:      Height:        PHYSICAL EXAM: Right lower extremity:  Neurovascular intact Sensation intact distally Intact pulses distally Dorsiflexion/Plantar flexion intact Incision: dressing C/D/I No cellulitis present Compartment soft  LABS  Results for orders placed or performed during the hospital encounter of 12/22/20 (from the past 24 hour(s))  Basic metabolic panel     Status: Abnormal   Collection Time: 12/25/20  4:14 AM  Result Value Ref Range   Sodium 134 (L) 135 - 145 mmol/L   Potassium 4.1 3.5 - 5.1 mmol/L   Chloride 102 98 - 111 mmol/L   CO2 23 22 - 32 mmol/L   Glucose, Bld 208 (H) 70 - 99 mg/dL   BUN 27 (H) 8 - 23 mg/dL   Creatinine, Ser 0.63 0.44 - 1.00 mg/dL   Calcium 9.3 8.9 - 10.3 mg/dL   GFR, Estimated >60 >60 mL/min   Anion gap 9 5 - 15  CBC     Status: Abnormal   Collection Time: 12/25/20  4:14 AM  Result Value Ref Range   WBC 10.2 4.0 - 10.5 K/uL   RBC 3.64 (L) 3.87 - 5.11 MIL/uL   Hemoglobin 10.6 (L) 12.0 - 15.0 g/dL   HCT 31.8 (L) 36.0 - 46.0 %   MCV 87.4 80.0 - 100.0 fL   MCH 29.1 26.0 - 34.0 pg   MCHC 33.3 30.0 - 36.0 g/dL   RDW 13.1 11.5 - 15.5 %   Platelets 297 150 - 400 K/uL   nRBC 0.0 0.0 - 0.2 %    DG Wrist 2 Views Left  Result Date: 12/23/2020 CLINICAL DATA:  Left wrist pain after a fall EXAM: LEFT WRIST - 2 VIEW COMPARISON:  None. FINDINGS: Multiple circumscribed cystic bone lesions demonstrated in the base of the first metacarpal bone, in multiple carpal bones, including the hamate, capitate, scaphoid, lunate, and triquetral bones, as well as the distal radius  and ulna. These are most likely degenerative cysts. Enchondromas would be a less likely consideration. No expansile changes are identified. There is bone resorption and deformity of the trapezium which is likely degenerative. No evidence of acute fracture or dislocation. Dorsal soft tissue swelling. IMPRESSION: No definite acute fracture or dislocation. Chronic changes as described, likely degenerative. Dorsal soft tissue swelling. Electronically Signed   By: Lucienne Capers M.D.   On: 12/23/2020 20:23   DG Knee Right Port  Result Date: 12/24/2020 CLINICAL DATA:  Post revision of knee replacement EXAM: PORTABLE RIGHT KNEE - 1-2 VIEW COMPARISON:  12/22/2020 FINDINGS: Post revision right total knee arthroplasty with long-stem femoral component. Postoperative soft tissue swelling and air. No evidence of complication. IMPRESSION: Post revision right total knee arthroplasty. Electronically Signed   By: Macy Mis M.D.   On: 12/24/2020 17:23    Assessment/Plan: 1 Day Post-Op   Principal Problem:   Dislocated knee, right, recurrent, initial encounter Active Problems:   Hypertension   Depression with anxiety   History of CVA (cerebrovascular  accident)   UTI (urinary tract infection)   Right knee dislocation, initial encounter   Pressure injury of skin   Patient is stable postop.  Patient is to keep immobilizer on her right knee at all times per Dr. Harlow Mares.  Continue current pain management.  Patient is weightbearing as tolerated and may begin physical therapy and Occupational Therapy per Dr. Harlow Mares instructions.   Thornton Park , MD 12/25/2020, 8:08 AM

## 2020-12-25 NOTE — Evaluation (Signed)
Physical Therapy Evaluation Patient Details Name: [name] MRN: [id number] DOB: [birth year] Today's Date: 12/25/2020  History of Present Illness  Pt is an 80 y.o. female with medical history significant for HTN, depression, and anxiety recently hospitalized from 9/38-10/3 for strangulated hernia undergoing small bowel resection on 9/22, with visit complicated by bilateral cerebral hemispheric watershed infarcts related to postop hypotension, further complicated by a right knee dislocation s/p reduction on 9/27 and again on 10/1 who was transferred from the ED at [facility] due to recurrent right knee dislocation following a fall out of bed several days prior at the rehab facility.  Patient was also incidentally found to have a UTI prior to transfer. Pt now s/p R TKA revision.   Clinical Impression  Pt was pleasant and motivated to participate during the session and put forth good effort during the session. Pt presented with significant functional weakness, however, and required heavy assist with bed mobility tasks and was unable to come to standing even with heavy +2 assist. Pt will benefit from PT services in a SNF setting upon discharge to safely address deficits listed in patient problem list for decreased caregiver assistance and eventual return to PLOF.         Recommendations for follow up therapy are one component of a multi-disciplinary discharge planning process, led by the attending physician.  Recommendations may be updated based on patient status, additional functional criteria and insurance authorization.  Follow Up Recommendations SNF;Supervision/Assistance - 24 hour    Equipment Recommendations  Other (comment) (TBD at next venue of care)    Recommendations for Other Services       Precautions / Restrictions Precautions Precautions: Fall Required Braces or Orthoses: Knee Immobilizer - Right Knee Immobilizer - Right: On at all times Restrictions Weight Bearing  Restrictions: Yes RLE Weight Bearing: Weight bearing as tolerated Other Position/Activity Restrictions: Confirmed with Dr. Mack Guise that pt is to have KI on at all times      Mobility  Bed Mobility Overal bed mobility: Needs Assistance Bed Mobility: Rolling Rolling: Mod assist   Supine to sit: Max assist Sit to supine: Max assist   General bed mobility comments: Max A for BLE and trunk control during sup to/from sit    Transfers                 General transfer comment: Pt unable to come to standing even with +2 assist  Ambulation/Gait                Stairs            Wheelchair Mobility    Modified Rankin (Stroke Patients Only)       Balance Overall balance assessment: Needs assistance Sitting-balance support: Bilateral upper extremity supported Sitting balance-Leahy Scale: Fair                                       Pertinent Vitals/Pain Pain Assessment: No/denies pain    Home Living Family/patient expects to be discharged to:: Skilled nursing facility     Type of Home: House Home Access: Level entry     Home Layout: One level Home Equipment: Environmental consultant - 2 wheels Additional Comments: Information obtained from previous PT evaluation    Prior Function Level of Independence: Independent with assistive device(s)         Comments: Pt uses RW for all mobility; manages ADL/IADL independently. Was  driving, active and independent prior to prior recent admission.     Hand Dominance   Dominant Hand: Right    Extremity/Trunk Assessment   Upper Extremity Assessment Upper Extremity Assessment: Generalized weakness    Lower Extremity Assessment Lower Extremity Assessment: Generalized weakness;RLE deficits/detail RLE: Unable to fully assess due to immobilization       Communication   Communication: No difficulties  Cognition Arousal/Alertness: Awake/alert Behavior During Therapy: WFL for tasks  assessed/performed Overall Cognitive Status: No family/caregiver present to determine baseline cognitive functioning                                        General Comments      Exercises Total Joint Exercises Ankle Circles/Pumps: AROM;Strengthening;Both;10 reps Hip ABduction/ADduction: AAROM;Strengthening;Both;10 reps Straight Leg Raises: 10 reps;Both;Strengthening;AAROM Long Arc Quad: AROM;Strengthening;Left;10 reps Knee Flexion: AROM;Strengthening;Left;10 reps   Assessment/Plan    PT Assessment Patient needs continued PT services  PT Problem List Decreased strength;Decreased range of motion;Decreased activity tolerance;Decreased balance;Decreased mobility;Decreased knowledge of use of DME;Decreased safety awareness;Decreased knowledge of precautions       PT Treatment Interventions DME instruction;Functional mobility training;Therapeutic activities;Therapeutic exercise;Balance training;Patient/family education;Gait training    PT Goals (Current goals can be found in the Care Plan section)  Acute Rehab PT Goals Patient Stated Goal: To be able to transfer to the chair PT Goal Formulation: With patient/family Time For Goal Achievement: 01/07/21 Potential to Achieve Goals: Fair    Frequency 7X/week   Barriers to discharge Inaccessible home environment;Decreased caregiver support      Co-evaluation               AM-PAC PT "6 Clicks" Mobility  Outcome Measure Help needed turning from your back to your side while in a flat bed without using bedrails?: A Lot Help needed moving from lying on your back to sitting on the side of a flat bed without using bedrails?: Total Help needed moving to and from a bed to a chair (including a wheelchair)?: Total Help needed standing up from a chair using your arms (e.g., wheelchair or bedside chair)?: Total Help needed to walk in hospital room?: Total Help needed climbing 3-5 steps with a railing? : Total 6 Click  Score: 7    End of Session Equipment Utilized During Treatment: Gait belt Activity Tolerance: Patient tolerated treatment well Patient left: in bed;with call bell/phone within reach;with bed alarm set;with SCD's reapplied;Other (comment) (KI and polar care donned to R knee) Nurse Communication: Mobility status;Weight bearing status PT Visit Diagnosis: Unsteadiness on feet (R26.81);History of falling (Z91.81);Muscle weakness (generalized) (M62.81);Other abnormalities of gait and mobility (R26.89)    Time: 2244-9753 PT Time Calculation (min) (ACUTE ONLY): 33 min   Charges:   PT Evaluation $PT Eval Moderate Complexity: 1 Mod PT Treatments $Therapeutic Exercise: 8-22 mins        D. Scott Atiyah Bauer PT, DPT 12/25/20, 2:06 PM

## 2020-12-25 NOTE — Plan of Care (Signed)

## 2020-12-26 LAB — CBC
HCT: 27.7 % — ABNORMAL LOW (ref 36.0–46.0)
Hemoglobin: 9.8 g/dL — ABNORMAL LOW (ref 12.0–15.0)
MCH: 31.1 pg (ref 26.0–34.0)
MCHC: 35.4 g/dL (ref 30.0–36.0)
MCV: 87.9 fL (ref 80.0–100.0)
Platelets: 298 10*3/uL (ref 150–400)
RBC: 3.15 MIL/uL — ABNORMAL LOW (ref 3.87–5.11)
RDW: 13.4 % (ref 11.5–15.5)
WBC: 12.6 10*3/uL — ABNORMAL HIGH (ref 4.0–10.5)
nRBC: 0 % (ref 0.0–0.2)

## 2020-12-26 LAB — BASIC METABOLIC PANEL
Anion gap: 5 (ref 5–15)
BUN: 19 mg/dL (ref 8–23)
CO2: 25 mmol/L (ref 22–32)
Calcium: 9.2 mg/dL (ref 8.9–10.3)
Chloride: 105 mmol/L (ref 98–111)
Creatinine, Ser: 0.51 mg/dL (ref 0.44–1.00)
GFR, Estimated: >60 mL/min (ref >60)
Glucose, Bld: 100 mg/dL — ABNORMAL HIGH (ref 70–99)
Potassium: 3.8 mmol/L (ref 3.5–5.1)
Sodium: 135 mmol/L (ref 135–145)

## 2020-12-26 MED ORDER — SODIUM CHLORIDE 0.9 % IV SOLN: INTRAVENOUS | Status: DC

## 2020-12-26 NOTE — NC FL2 (Signed)
[place] LEVEL OF CARE SCREENING TOOL     IDENTIFICATION  Patient Name: [name] Birthdate: 08-Jul-1940 Sex: female Admission Date (Current Location): 12/22/2020  [facility] and [place] Number:  Engineering geologist and Address:  [facility], [address] [place], [place] [address]      Provider Number: [id number]  Attending Physician Name and Address:  Gwynne Edinger, MD  Relative Name and Phone Number:       Current Level of Care: Hospital Recommended Level of Care: [place] Prior Approval Number:    Date Approved/Denied:   PASRR Number:    Discharge Plan: SNF    Current Diagnoses: Patient Active Problem List   Diagnosis Date Noted   History of CVA (cerebrovascular accident) 12/23/2020   UTI (urinary tract infection) 12/23/2020   Right knee dislocation, initial encounter 12/23/2020   Pressure injury of skin 12/23/2020   Dislocated knee, right, recurrent, initial encounter 12/22/2020   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[facility]) 12/10/2020   Knee dislocation, right, initial encounter 12/10/2020   HLD (hyperlipidemia) 12/05/2020   Depression with anxiety 12/05/2020   Coma ([place]) 12/05/2020   Acute encephalopathy    Strangulated inguinal hernia 12/02/2020   Anxiety 11/25/2020   Aortic valve stenosis 09/09/2020   Prediabetes 05/17/2020   Hypertension 05/10/2020   Age-related osteoporosis without current pathological fracture 01/03/2016   Pure hypercholesterolemia 01/03/2016   Elevated TSH 01/03/2016   Primary osteoarthritis of both knees 12/06/2015   Vitamin D deficiency 06/16/2014   Personal history of other malignant neoplasm of skin 02/19/2014   Prolapse of vaginal vault 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 08/14/2013   Patient on low glycemic diet 08/02/2012   Knee pain 08/02/2012   Gynecological examination 06/20/2011   Neuropathy 06/20/2011   Uterine prolapse 06/20/2011   Anemia 06/18/2009   HYPOKALEMIA,  MILD 06/25/2008   INTESTINAL OBSTRUCTION, HX OF 12/11/2007   BLEEDING, POSTMENOPAUSAL 10/23/2006   PERSONALITY DISORDER 10/22/2006   TREMOR, ESSENTIAL 10/22/2006   TRANSAMINASES, SERUM, ELEVATED 10/22/2006    Orientation RESPIRATION BLADDER Height & Weight     Self, Time, Situation, Place  Normal Incontinent Weight: 165 lb (74.8 kg) Height:  5\' 3"  (160 cm)  BEHAVIORAL SYMPTOMS/MOOD NEUROLOGICAL BOWEL NUTRITION STATUS      Continent Diet (regular)  AMBULATORY STATUS COMMUNICATION OF NEEDS Skin   Extensive Assist Verbally PU Stage and Appropriate Care, Other (Comment) (pressure injury on ankle, foam dressing. closed incision on right knee and right groin.) PU Stage 1 Dressing:  (buttocks) PU Stage 2 Dressing:  (coccyx, foam dressing, change every 3 days)                   Personal Care Assistance Level of Assistance  Bathing, Feeding, Dressing Bathing Assistance: Maximum assistance Feeding assistance: Independent Dressing Assistance: Maximum assistance     Functional Limitations Info  Sight, Hearing, Speech Sight Info: Adequate Hearing Info: Adequate Speech Info: Adequate    SPECIAL CARE FACTORS FREQUENCY  PT (By licensed PT), OT (By licensed OT)     PT Frequency: 5x OT Frequency: 5x            Contractures Contractures Info: Not present    Additional Factors Info  Code Status, Allergies Code Status Info: DNR Allergies Info: penicillins           Current Medications (12/26/2020):  This is the current hospital active medication list Current Facility-Administered Medications  Medication Dose Route Frequency Provider Last Rate Last Admin   0.9 %  sodium chloride infusion   Intravenous Continuous Gwynne Edinger, MD 100 mL/hr at 12/26/20 1158 New Bag at 12/26/20 1158   acetaminophen (TYLENOL) tablet 650 mg  650 mg Oral Q6H PRN Lovell Sheehan, MD   650 mg at 12/26/20 0441   Or   acetaminophen (TYLENOL) suppository 650 mg  650 mg Rectal Q6H PRN Lovell Sheehan, MD       alum & mag hydroxide-simeth (MAALOX/MYLANTA) 200-200-20 MG/5ML suspension 30 mL  30 mL Oral Q4H PRN Lovell Sheehan, MD       aspirin chewable tablet 81 mg  81 mg Oral Daily Lovell Sheehan, MD   81 mg at 12/26/20 1941   bisacodyl (DULCOLAX) suppository 10 mg  10 mg Rectal Daily PRN Lovell Sheehan, MD       carvedilol (COREG) tablet 6.25 mg  6.25 mg Oral BID WC Lovell Sheehan, MD   6.25 mg at 12/26/20 7408   diphenhydrAMINE (BENADRYL) 12.5 MG/5ML elixir 12.5-25 mg  12.5-25 mg Oral Q4H PRN Lovell Sheehan, MD       docusate sodium (COLACE) capsule 100 mg  100 mg Oral BID Lovell Sheehan, MD   100 mg at 12/26/20 0828   enoxaparin (LOVENOX) injection 40 mg  40 mg Subcutaneous Q24H Gwynne Edinger, MD   40 mg at 12/26/20 1448   gabapentin (NEURONTIN) capsule 100 mg  100 mg Oral QHS Lovell Sheehan, MD   100 mg at 12/25/20 2108   HYDROcodone-acetaminophen (NORCO/VICODIN) 5-325 MG per tablet 1 tablet  1 tablet Oral Q4H PRN Gwynne Edinger, MD   1 tablet at 12/26/20 1856   HYDROmorphone (DILAUDID) injection 1-2 mg  1-2 mg Intravenous Q3H PRN Wouk, Ailene Rud, MD       influenza vaccine adjuvanted (FLUAD) injection 0.5 mL  0.5 mL Intramuscular Tomorrow-1000 Athena Masse, MD       menthol-cetylpyridinium (CEPACOL) lozenge 3 mg  1 lozenge Oral PRN Lovell Sheehan, MD       Or   phenol (CHLORASEPTIC) mouth spray 1 spray  1 spray Mouth/Throat PRN Lovell Sheehan, MD       metoCLOPramide (REGLAN) tablet 5-10 mg  5-10 mg Oral Q8H PRN Lovell Sheehan, MD       Or   metoCLOPramide (REGLAN) injection 5-10 mg  5-10 mg Intravenous Q8H PRN Lovell Sheehan, MD       ondansetron [facility]) tablet 4 mg  4 mg Oral Q6H PRN Lovell Sheehan, MD       Or   ondansetron [facility]) injection 4 mg  4 mg Intravenous Q6H PRN Lovell Sheehan, MD   4 mg at 12/24/20 1432   ondansetron (ZOFRAN) tablet 4 mg  4 mg Oral Q6H PRN Lovell Sheehan, MD       Or   ondansetron [facility]) injection 4 mg  4 mg  Intravenous Q6H PRN Lovell Sheehan, MD       pneumococcal 23 valent vaccine (PNEUMOVAX-23) injection 0.5 mL  0.5 mL Intramuscular Tomorrow-1000 Athena Masse, MD       polyethylene glycol (MIRALAX / GLYCOLAX) packet 17 g  17 g Oral Daily Gwynne Edinger, MD   17 g at 12/26/20 0836   rosuvastatin (CRESTOR) tablet 10 mg  10 mg Oral Daily Lovell Sheehan, MD   10 mg at 12/26/20 0827   senna (SENOKOT) tablet 8.6 mg  1 tablet Oral Daily Wouk, Ailene Rud, MD  8.6 mg at 12/26/20 0828   sertraline (ZOLOFT) tablet 25 mg  25 mg Oral Daily Lovell Sheehan, MD   25 mg at 12/26/20 0712     Discharge Medications: Please see discharge summary for a list of discharge medications.  Relevant Imaging Results:  Relevant Lab Results:   Additional Information SS#: [id number]. COVID vaccines: 09/11/19 (Johnson&Johnson), 01/31/20 (Moderna). May have had second booster based on chart review  [place], LCSW

## 2020-12-26 NOTE — Progress Notes (Signed)
Physical Therapy Treatment Patient Details Name: [name] MRN: [id number] DOB: 1940/07/22 Today's Date: 12/26/2020   History of Present Illness Pt is an 80 y.o. female with medical history significant for HTN, depression, and anxiety recently hospitalized from 9/38-10/3 for strangulated hernia undergoing small bowel resection on 9/22, with visit complicated by bilateral cerebral hemispheric watershed infarcts related to postop hypotension, further complicated by a right knee dislocation s/p reduction on 9/27 and again on 10/1 who was transferred from the ED at [facility] due to recurrent right knee dislocation following a fall out of bed several days prior at the rehab facility.  Patient was also incidentally found to have a UTI prior to transfer. Pt now s/p R TKA revision.    PT Comments     Pt with increased lethargy today but is able to participate in LE ex and ROM.  She falls asleep frequently during session today.  She does assist as she is able to scoot up in bed in supine but remains very limited.   Recommendations for follow up therapy are one component of a multi-disciplinary discharge planning process, led by the attending physician.  Recommendations may be updated based on patient status, additional functional criteria and insurance authorization.  Follow Up Recommendations  SNF;Supervision/Assistance - 24 hour     Equipment Recommendations  Other (comment)    Recommendations for Other Services       Precautions / Restrictions Precautions Required Braces or Orthoses: Knee Immobilizer - Right Knee Immobilizer - Right: On at all times Restrictions Weight Bearing Restrictions: Yes RLE Weight Bearing: Weight bearing as tolerated Other Position/Activity Restrictions: Confirmed with Dr. Mack Guise on 12/25/20 that pt is to have KI on at all times until ordered otherwise     Mobility  Bed Mobility Overal bed mobility: Needs Assistance Bed Mobility:  Rolling Rolling: Max assist;Mod assist         General bed mobility comments: poor ability to help with scooting up in bed.    Transfers                 General transfer comment: deferred attempt due to lethargy  Ambulation/Gait                 Stairs             Wheelchair Mobility    Modified Rankin (Stroke Patients Only)       Balance                                            Cognition Arousal/Alertness: Awake/alert Behavior During Therapy: WFL for tasks assessed/performed Overall Cognitive Status: No family/caregiver present to determine baseline cognitive functioning                                        Exercises Other Exercises Other Exercises: BLE PROM and stretcing with R KI donned at all times per orders.    General Comments        Pertinent Vitals/Pain Pain Assessment: Faces Faces Pain Scale: Hurts little more Pain Location: L knee Pain Descriptors / Indicators: Grimacing;Discomfort;Guarding Pain Intervention(s): Limited activity within patient's tolerance;Monitored during session;Repositioned    Home Living  Prior Function            PT Goals (current goals can now be found in the care plan section) Progress towards PT goals: Not progressing toward goals - comment    Frequency    7X/week      PT Plan Current plan remains appropriate    Co-evaluation              AM-PAC PT "6 Clicks" Mobility   Outcome Measure  Help needed turning from your back to your side while in a flat bed without using bedrails?: A Lot Help needed moving from lying on your back to sitting on the side of a flat bed without using bedrails?: Total Help needed moving to and from a bed to a chair (including a wheelchair)?: Total Help needed standing up from a chair using your arms (e.g., wheelchair or bedside chair)?: Total Help needed to walk in hospital room?:  Total Help needed climbing 3-5 steps with a railing? : Total 6 Click Score: 7    End of Session   Activity Tolerance: Patient limited by lethargy Patient left: in bed;with call bell/phone within reach;with bed alarm set;with SCD's reapplied;Other (comment) Nurse Communication: Mobility status;Weight bearing status PT Visit Diagnosis: Unsteadiness on feet (R26.81);History of falling (Z91.81);Muscle weakness (generalized) (M62.81);Other abnormalities of gait and mobility (R26.89) Hemiplegia - Right/Left: Left Hemiplegia - dominant/non-dominant: Non-dominant Hemiplegia - caused by: Cerebral infarction Pain - Right/Left: Right Pain - part of body: Knee     Time: 5670-1410 PT Time Calculation (min) (ACUTE ONLY): 11 min  Charges:  $Therapeutic Exercise: 8-22 mins                    Chesley Noon, PTA 12/26/20, 11:43 AM

## 2020-12-26 NOTE — Plan of Care (Signed)

## 2020-12-26 NOTE — Progress Notes (Signed)
PROGRESS NOTE    [name]  YBW:[id number] DOB: 30-May-1940 DOA: 12/22/2020 PCP: Jon Billings, NP  Outpatient Specialists: none    Brief Narrative:   [name] is a 80 y.o. female with medical history significant for htn, schizoaffective disorder, who presents after fall from snf. Recent hospitalization for incarcerated inguinal hernia s/p operative repair. Hospital course complicated by cva (watershed infarcts 2/2 postoperative hypotension) and right knee dislocation s/p reduction x2. She presents s/p fall at SNF, found to have recurrent right knee dislocation.   Assessment & Plan:   Principal Problem:   Dislocated knee, right, recurrent, initial encounter Active Problems:   Hypertension   Depression with anxiety   History of CVA (cerebrovascular accident)   UTI (urinary tract infection)   Right knee dislocation, initial encounter   Pressure injury of skin  # Recurrent right knee dislocation S/p revision of right total knee arthroplasty on 10/14 w/ Dr. Harlow Mares.  - maintain knee immobilizer - hydrocodone/dilaudid for pain; miralax/senna for ppx - f/u ortho recs  - PT/OT consults, advising snf - TOC consult for snf placement  # Pyuria Patient denies suprapubic pain or dysuria but does endorse several months frequency. Urine culture ordered on 10/13 but lab says that wasn't performed and now no sample to test. Was treated w/ fosfomycin x1. No new uti symptoms - monitor   # Hx CVA Stable. Recent, possibly 2/2 post-op hypotension (though no sig post-op hypotension documented) - relative permissive htn as below - cont asa, statin  # HTN Here bp wnl - cont home coreg - hold home amlod and lisinopril, will allow permissive htn post-op - po is somewhat limited, will run fluids for 24 hours  # MDD - home sertraline   DVT prophylaxis: lovenox  Code Status: DNR Family Communication: daughter Caryl Pina updated telephonically 10/15  Level of care:  Med-Surg Status is: Inpatient  Remains inpatient appropriate because:Inpatient level of care appropriate due to severity of illness  Dispo: The patient is from: SNF              Anticipated d/c is to: SNF              Patient currently is not medically stable to d/c.   Difficult to place patient No        Consultants:  orthopedics  Procedures: none  Antimicrobials:  S/p ceftriaxone and fosfomycin   Subjective: Complains of mild knee pain. Tolerating some diet. Worked w/ pt this morning.  Objective: Vitals:   12/25/20 2016 12/26/20 0613 12/26/20 0757 12/26/20 1115  BP: (!) 144/57 140/66 (!) 154/54 (!) 118/51  Pulse: 73 76 74 62  Resp: 17 16 16 16   Temp: (!) 97.4 F (36.3 C) (!) 97.4 F (36.3 C) 97.8 F (36.6 C) (!) 97.4 F (36.3 C)  TempSrc: Oral Oral Oral   SpO2: 96% 99% 98% 100%  Weight:      Height:        Intake/Output Summary (Last 24 hours) at 12/26/2020 1136 Last data filed at 12/26/2020 7681 Gross per 24 hour  Intake --  Output 800 ml  Net -800 ml   Filed Weights   12/24/20 1204  Weight: 74.8 kg    Examination:  General exam: Appears calm and in some pain Respiratory system: Clear to auscultation. Respiratory effort normal. Cardiovascular system: S1 & S2 heard, RRR. No JVD, murmurs, rubs, gallops or clicks. No pedal edema. Gastrointestinal system: Abdomen is nondistended, soft and nontender. No organomegaly or masses felt. Normal  bowel sounds heard. Central nervous system: Alert and oriented. Left sided weakness Extremities: warm, immobilizer on right leg Skin: stage 1 sacral decub Psychiatry: calm    Data Reviewed: I have personally reviewed following labs and imaging studies  CBC: Recent Labs  Lab 12/22/20 1402 12/25/20 0414 12/26/20 0436  WBC 8.3 10.2 12.6*  HGB 11.3* 10.6* 9.8*  HCT 34.6* 31.8* 27.7*  MCV 89.4 87.4 87.9  PLT 395 297 009   Basic Metabolic Panel: Recent Labs  Lab 12/22/20 1402 12/24/20 0553  12/25/20 0414 12/26/20 0436  NA 134* 134* 134* 135  K 4.0 3.8 4.1 3.8  CL 103 102 102 105  CO2 20* 24 23 25   GLUCOSE 95 86 208* 100*  BUN 17 18 27* 19  CREATININE 0.57 0.43* 0.63 0.51  CALCIUM 9.4 9.2 9.3 9.2   GFR: Estimated Creatinine Clearance: 53.5 mL/min (by C-G formula based on SCr of 0.51 mg/dL). Liver Function Tests: Recent Labs  Lab 12/22/20 1402  AST 20  ALT 14  ALKPHOS 59  BILITOT 0.7  PROT 6.1*  ALBUMIN 3.3*   No results for input(s): LIPASE, AMYLASE in the last 168 hours. No results for input(s): AMMONIA in the last 168 hours. Coagulation Profile: No results for input(s): INR, PROTIME in the last 168 hours. Cardiac Enzymes: No results for input(s): CKTOTAL, CKMB, CKMBINDEX, TROPONINI in the last 168 hours. BNP (last 3 results) No results for input(s): PROBNP in the last 8760 hours. HbA1C: No results for input(s): HGBA1C in the last 72 hours. CBG: No results for input(s): GLUCAP in the last 168 hours. Lipid Profile: No results for input(s): CHOL, HDL, LDLCALC, TRIG, CHOLHDL, LDLDIRECT in the last 72 hours. Thyroid Function Tests: No results for input(s): TSH, T4TOTAL, FREET4, T3FREE, THYROIDAB in the last 72 hours. Anemia Panel: No results for input(s): VITAMINB12, FOLATE, FERRITIN, TIBC, IRON, RETICCTPCT in the last 72 hours. Urine analysis:    Component Value Date/Time   COLORURINE YELLOW 12/22/2020 1414   APPEARANCEUR CLOUDY (A) 12/22/2020 1414   APPEARANCEUR Cloudy (A) 08/31/2020 1039   LABSPEC 1.014 12/22/2020 1414   PHURINE 6.0 12/22/2020 1414   GLUCOSEU NEGATIVE 12/22/2020 1414   HGBUR SMALL (A) 12/22/2020 1414   BILIRUBINUR NEGATIVE 12/22/2020 1414   BILIRUBINUR Negative 08/31/2020 1039   KETONESUR NEGATIVE 12/22/2020 1414   PROTEINUR NEGATIVE 12/22/2020 1414   UROBILINOGEN 0.2 06/25/2008 1242   NITRITE POSITIVE (A) 12/22/2020 1414   LEUKOCYTESUR LARGE (A) 12/22/2020 1414   Sepsis  Labs: @LABRCNTIP (procalcitonin:4,lacticidven:4)  ) Recent Results (from the past 240 hour(s))  Resp Panel by RT-PCR (Flu A&B, Covid) Nasopharyngeal Swab     Status: None   Collection Time: 12/22/20  2:14 PM   Specimen: Nasopharyngeal Swab; Nasopharyngeal(NP) swabs in vial transport medium  Result Value Ref Range Status   SARS Coronavirus 2 by RT PCR NEGATIVE NEGATIVE Final    Comment: (NOTE) SARS-CoV-2 target nucleic acids are NOT DETECTED.  The SARS-CoV-2 RNA is generally detectable in upper respiratory specimens during the acute phase of infection. The lowest concentration of SARS-CoV-2 viral copies this assay can detect is 138 copies/mL. A negative result does not preclude SARS-Cov-2 infection and should not be used as the sole basis for treatment or other patient management decisions. A negative result may occur with  improper specimen collection/handling, submission of specimen other than nasopharyngeal swab, presence of viral mutation(s) within the areas targeted by this assay, and inadequate number of viral copies(<138 copies/mL). A negative result must be combined with clinical observations, patient  history, and epidemiological information. The expected result is Negative.  Fact Sheet for Patients:  EntrepreneurPulse.com.au  Fact Sheet for Healthcare Providers:  IncredibleEmployment.be  This test is no t yet approved or cleared by the [place] FDA and  has been authorized for detection and/or diagnosis of SARS-CoV-2 by FDA under an Emergency Use Authorization (EUA). This EUA will remain  in effect (meaning this test can be used) for the duration of the COVID-19 declaration under Section 564(b)(1) of the Act, 21 U.S.C.section 360bbb-3(b)(1), unless the authorization is terminated  or revoked sooner.       Influenza A by PCR NEGATIVE NEGATIVE Final   Influenza B by PCR NEGATIVE NEGATIVE Final    Comment: (NOTE) The Xpert  Xpress SARS-CoV-2/FLU/RSV plus assay is intended as an aid in the diagnosis of influenza from Nasopharyngeal swab specimens and should not be used as a sole basis for treatment. Nasal washings and aspirates are unacceptable for Xpert Xpress SARS-CoV-2/FLU/RSV testing.  Fact Sheet for Patients: EntrepreneurPulse.com.au  Fact Sheet for Healthcare Providers: IncredibleEmployment.be  This test is not yet approved or cleared by the [place] FDA and has been authorized for detection and/or diagnosis of SARS-CoV-2 by FDA under an Emergency Use Authorization (EUA). This EUA will remain in effect (meaning this test can be used) for the duration of the COVID-19 declaration under Section 564(b)(1) of the Act, 21 U.S.C. section 360bbb-3(b)(1), unless the authorization is terminated or revoked.  Performed at [facility], [place] [address] [place],  [address]          Radiology Studies: DG Knee Right Port  Result Date: 12/24/2020 CLINICAL DATA:  Post revision of knee replacement EXAM: PORTABLE RIGHT KNEE - 1-2 VIEW COMPARISON:  12/22/2020 FINDINGS: Post revision right total knee arthroplasty with long-stem femoral component. Postoperative soft tissue swelling and air. No evidence of complication. IMPRESSION: Post revision right total knee arthroplasty. Electronically Signed   By: Macy Mis M.D.   On: 12/24/2020 17:23        Scheduled Meds:  aspirin  81 mg Oral Daily   carvedilol  6.25 mg Oral BID WC   docusate sodium  100 mg Oral BID   enoxaparin (LOVENOX) injection  40 mg Subcutaneous Q24H   gabapentin  100 mg Oral QHS   influenza vaccine adjuvanted  0.5 mL Intramuscular Tomorrow-1000   pneumococcal 23 valent vaccine  0.5 mL Intramuscular Tomorrow-1000   polyethylene glycol  17 g Oral Daily   rosuvastatin  10 mg Oral Daily   senna  1 tablet Oral Daily   sertraline  25 mg Oral Daily   Continuous Infusions:      LOS: 4  days    Time spent: 25 min    Desma Maxim, MD [facility]   If 7PM-7AM, please contact night-coverage [email or web address] Password [email or web address] 12/26/2020, 11:36 AM

## 2020-12-26 NOTE — TOC Progression Note (Addendum)
Transition of Care [facility]) - Progression Note    Patient Details  Name: [name] MRN: [id number] Date of Birth: 02/10/1941  [facility] CM/SW Contact  Eileen Stanford, LCSW Phone Number: 12/26/2020, 12:22 PM  Clinical Narrative:  Pt's daughter present at bedside and states pt will return to [facility] at d/c. Referral sent via hub.   Auth started       Expected Discharge Plan and Services                                                 Social Determinants of Health (SDOH) Interventions    Readmission Risk Interventions No flowsheet data found.

## 2020-12-26 NOTE — Progress Notes (Signed)
  Subjective:  POD #2 s/p revision right total knee arthroplasty.   Patient reports right knee pain as mild to moderate.  Patient receiving assistance eating her dinner by nurse tech.  Patient states she does not believe her prognosis moving forward is very good.  Objective:   VITALS:   Vitals:   12/26/20 0757 12/26/20 1115 12/26/20 1543 12/26/20 1545  BP: (!) 154/54 (!) 118/51 (!) 136/54 (!) 138/56  Pulse: 74 62 60 (!) 58  Resp: 16 16 16 16   Temp: 97.8 F (36.6 C) (!) 97.4 F (36.3 C) 98.2 F (36.8 C) 98.2 F (36.8 C)  TempSrc: Oral     SpO2: 98% 100% 97% 97%  Weight:      Height:        PHYSICAL EXAM: Right lower extremity: Neurovascular intact Sensation intact distally Intact pulses distally Dorsiflexion/Plantar flexion intact Incision: dressing C/D/I No cellulitis present Compartment soft  LABS  Results for orders placed or performed during the hospital encounter of 12/22/20 (from the past 24 hour(s))  Basic metabolic panel     Status: Abnormal   Collection Time: 12/26/20  4:36 AM  Result Value Ref Range   Sodium 135 135 - 145 mmol/L   Potassium 3.8 3.5 - 5.1 mmol/L   Chloride 105 98 - 111 mmol/L   CO2 25 22 - 32 mmol/L   Glucose, Bld 100 (H) 70 - 99 mg/dL   BUN 19 8 - 23 mg/dL   Creatinine, Ser 0.51 0.44 - 1.00 mg/dL   Calcium 9.2 8.9 - 10.3 mg/dL   GFR, Estimated >60 >60 mL/min   Anion gap 5 5 - 15  CBC     Status: Abnormal   Collection Time: 12/26/20  4:36 AM  Result Value Ref Range   WBC 12.6 (H) 4.0 - 10.5 K/uL   RBC 3.15 (L) 3.87 - 5.11 MIL/uL   Hemoglobin 9.8 (L) 12.0 - 15.0 g/dL   HCT 27.7 (L) 36.0 - 46.0 %   MCV 87.9 80.0 - 100.0 fL   MCH 31.1 26.0 - 34.0 pg   MCHC 35.4 30.0 - 36.0 g/dL   RDW 13.4 11.5 - 15.5 %   Platelets 298 150 - 400 K/uL   nRBC 0.0 0.0 - 0.2 %    No results found.  Assessment/Plan: 2 Days Post-Op   Principal Problem:   Dislocated knee, right, recurrent, initial encounter Active Problems:   Hypertension    Depression with anxiety   History of CVA (cerebrovascular accident)   UTI (urinary tract infection)   Right knee dislocation, initial encounter   Pressure injury of skin  Continue current pain management.  Continue with physical therapy.  Patient on Lovenox for DVT prophylaxis.   Thornton Park , MD 12/26/2020, 6:32 PM

## 2020-12-27 ENCOUNTER — Emergency Department (HOSPITAL_COMMUNITY)
Admission: EM | Admit: 2020-12-27 | Discharge: 2020-12-28 | Disposition: A | Discharge status: Home / Self Care | Payer: Medicare PPO | Attending: Emergency Medicine | Admitting: Emergency Medicine

## 2020-12-27 ENCOUNTER — Encounter: Payer: Self-pay | Admitting: Orthopedic Surgery

## 2020-12-27 ENCOUNTER — Emergency Department (HOSPITAL_COMMUNITY): Payer: Medicare PPO

## 2020-12-27 ENCOUNTER — Ambulatory Visit: Payer: Medicare PPO | Admitting: Nurse Practitioner

## 2020-12-27 DIAGNOSIS — S83104A Unspecified dislocation of right knee, initial encounter: Secondary | ICD-10-CM | POA: Diagnosis not present

## 2020-12-27 DIAGNOSIS — Z7982 Long term (current) use of aspirin: Secondary | ICD-10-CM | POA: Insufficient documentation

## 2020-12-27 DIAGNOSIS — F32A Depression, unspecified: Secondary | ICD-10-CM | POA: Diagnosis not present

## 2020-12-27 DIAGNOSIS — Z79899 Other long term (current) drug therapy: Secondary | ICD-10-CM | POA: Insufficient documentation

## 2020-12-27 DIAGNOSIS — I10 Essential (primary) hypertension: Secondary | ICD-10-CM | POA: Diagnosis not present

## 2020-12-27 DIAGNOSIS — Z20822 Contact with and (suspected) exposure to covid-19: Secondary | ICD-10-CM | POA: Insufficient documentation

## 2020-12-27 DIAGNOSIS — M25561 Pain in right knee: Secondary | ICD-10-CM | POA: Diagnosis not present

## 2020-12-27 DIAGNOSIS — Z87891 Personal history of nicotine dependence: Secondary | ICD-10-CM | POA: Insufficient documentation

## 2020-12-27 DIAGNOSIS — Y900 Blood alcohol level of less than 20 mg/100 ml: Secondary | ICD-10-CM | POA: Insufficient documentation

## 2020-12-27 LAB — RAPID URINE DRUG SCREEN, HOSP PERFORMED
Amphetamines: NOT DETECTED
Barbiturates: NOT DETECTED
Benzodiazepines: NOT DETECTED
Cocaine: NOT DETECTED
Opiates: POSITIVE — AB
Tetrahydrocannabinol: NOT DETECTED

## 2020-12-27 LAB — URINALYSIS, ROUTINE W REFLEX MICROSCOPIC
Bilirubin Urine: NEGATIVE
Glucose, UA: NEGATIVE mg/dL
Hgb urine dipstick: NEGATIVE
Ketones, ur: NEGATIVE mg/dL
Leukocytes,Ua: NEGATIVE
Nitrite: NEGATIVE
Protein, ur: NEGATIVE mg/dL
Specific Gravity, Urine: 1.012 (ref 1.005–1.030)
pH: 8 (ref 5.0–8.0)

## 2020-12-27 LAB — CBC WITH DIFFERENTIAL/PLATELET
Abs Immature Granulocytes: 0.04 10*3/uL (ref 0.00–0.07)
Basophils Absolute: 0 10*3/uL (ref 0.0–0.1)
Basophils Relative: 0 %
Eosinophils Absolute: 0.1 10*3/uL (ref 0.0–0.5)
Eosinophils Relative: 1 %
HCT: 29.8 % — ABNORMAL LOW (ref 36.0–46.0)
Hemoglobin: 10 g/dL — ABNORMAL LOW (ref 12.0–15.0)
Immature Granulocytes: 1 %
Lymphocytes Relative: 23 %
Lymphs Abs: 1.9 10*3/uL (ref 0.7–4.0)
MCH: 29.7 pg (ref 26.0–34.0)
MCHC: 33.6 g/dL (ref 30.0–36.0)
MCV: 88.4 fL (ref 80.0–100.0)
Monocytes Absolute: 0.7 10*3/uL (ref 0.1–1.0)
Monocytes Relative: 8 %
Neutro Abs: 5.7 10*3/uL (ref 1.7–7.7)
Neutrophils Relative %: 67 %
Platelets: 288 10*3/uL (ref 150–400)
RBC: 3.37 MIL/uL — ABNORMAL LOW (ref 3.87–5.11)
RDW: 13.2 % (ref 11.5–15.5)
WBC: 8.5 10*3/uL (ref 4.0–10.5)
nRBC: 0 % (ref 0.0–0.2)

## 2020-12-27 LAB — COMPREHENSIVE METABOLIC PANEL
ALT: 18 U/L (ref 0–44)
AST: 23 U/L (ref 15–41)
Albumin: 3.5 g/dL (ref 3.5–5.0)
Alkaline Phosphatase: 58 U/L (ref 38–126)
Anion gap: 8 (ref 5–15)
BUN: 9 mg/dL (ref 8–23)
CO2: 22 mmol/L (ref 22–32)
Calcium: 9.2 mg/dL (ref 8.9–10.3)
Chloride: 102 mmol/L (ref 98–111)
Creatinine, Ser: 0.42 mg/dL — ABNORMAL LOW (ref 0.44–1.00)
GFR, Estimated: >60 mL/min (ref >60)
Glucose, Bld: 103 mg/dL — ABNORMAL HIGH (ref 70–99)
Potassium: 3.6 mmol/L (ref 3.5–5.1)
Sodium: 132 mmol/L — ABNORMAL LOW (ref 135–145)
Total Bilirubin: 0.7 mg/dL (ref 0.3–1.2)
Total Protein: 6.3 g/dL — ABNORMAL LOW (ref 6.5–8.1)

## 2020-12-27 LAB — SALICYLATE LEVEL: Salicylate Lvl: <7 mg/dL — ABNORMAL LOW (ref 7.0–30.0)

## 2020-12-27 LAB — ACETAMINOPHEN LEVEL: Acetaminophen (Tylenol), Serum: <10 ug/mL — ABNORMAL LOW (ref 10–30)

## 2020-12-27 LAB — RESP PANEL BY RT-PCR (FLU A&B, COVID) ARPGX2
Influenza A by PCR: NEGATIVE
Influenza A by PCR: NEGATIVE
Influenza B by PCR: NEGATIVE
Influenza B by PCR: NEGATIVE
SARS Coronavirus 2 by RT PCR: NEGATIVE
SARS Coronavirus 2 by RT PCR: NEGATIVE

## 2020-12-27 LAB — ETHANOL: Alcohol, Ethyl (B): <10 mg/dL (ref <10)

## 2020-12-27 MED ORDER — POLYETHYLENE GLYCOL 3350 17 G PO PACK: 17.0000 g | PACK | Freq: Every day | ORAL | 0 refills | Status: AC

## 2020-12-27 MED ORDER — ACETAMINOPHEN 325 MG PO TABS: 650.0000 mg | ORAL_TABLET | Freq: Four times a day (QID) | ORAL | Status: DC | PRN

## 2020-12-27 MED ORDER — HYDROCODONE-ACETAMINOPHEN 5-325 MG PO TABS: 1.0000 | ORAL_TABLET | ORAL | Status: DC | PRN

## 2020-12-27 MED ORDER — ROSUVASTATIN CALCIUM 10 MG PO TABS
10.0000 mg | ORAL_TABLET | Freq: Every day | ORAL | Status: DC
  Administered 2020-12-28: 10 mg via ORAL
  Filled 2020-12-27: qty 1

## 2020-12-27 MED ORDER — AMLODIPINE BESYLATE 5 MG PO TABS
10.0000 mg | ORAL_TABLET | Freq: Every day | ORAL | Status: DC
  Administered 2020-12-28: 10 mg via ORAL
  Filled 2020-12-27: qty 2

## 2020-12-27 MED ORDER — INFLUENZA VAC A&B SA ADJ QUAD 0.5 ML IM PRSY
0.5000 mL | PREFILLED_SYRINGE | Freq: Once | INTRAMUSCULAR | Status: AC
  Administered 2020-12-27: 0.5 mL via INTRAMUSCULAR
  Filled 2020-12-27: qty 0.5

## 2020-12-27 MED ORDER — ASPIRIN 81 MG PO CHEW
81.0000 mg | CHEWABLE_TABLET | Freq: Every day | ORAL | Status: DC
  Administered 2020-12-28: 81 mg via ORAL
  Filled 2020-12-27: qty 1

## 2020-12-27 MED ORDER — POLYETHYLENE GLYCOL 3350 17 G PO PACK
17.0000 g | PACK | Freq: Every day | ORAL | Status: DC
  Administered 2020-12-28: 17 g via ORAL
  Filled 2020-12-27: qty 1

## 2020-12-27 MED ORDER — TIZANIDINE HCL 4 MG PO TABS: 4.0000 mg | ORAL_TABLET | Freq: Three times a day (TID) | ORAL | Status: DC | PRN

## 2020-12-27 MED ORDER — HYDROCODONE-ACETAMINOPHEN 5-325 MG PO TABS: 1.0000 | ORAL_TABLET | ORAL | 0 refills | Status: DC | PRN

## 2020-12-27 MED ORDER — GABAPENTIN 100 MG PO CAPS
100.0000 mg | ORAL_CAPSULE | Freq: Every day | ORAL | Status: DC
  Administered 2020-12-28: 100 mg via ORAL
  Filled 2020-12-27: qty 1

## 2020-12-27 MED ORDER — CARVEDILOL 3.125 MG PO TABS
6.2500 mg | ORAL_TABLET | Freq: Two times a day (BID) | ORAL | Status: DC
  Administered 2020-12-28: 6.25 mg via ORAL
  Filled 2020-12-27: qty 2

## 2020-12-27 MED ORDER — SERTRALINE HCL 50 MG PO TABS
25.0000 mg | ORAL_TABLET | Freq: Every day | ORAL | Status: DC
  Administered 2020-12-28: 25 mg via ORAL
  Filled 2020-12-27: qty 1

## 2020-12-27 MED ORDER — ENOXAPARIN SODIUM 40 MG/0.4ML IJ SOSY: 40.0000 mg | PREFILLED_SYRINGE | INTRAMUSCULAR | Status: DC

## 2020-12-27 MED ORDER — DOCUSATE SODIUM 100 MG PO CAPS
100.0000 mg | ORAL_CAPSULE | Freq: Two times a day (BID) | ORAL | Status: DC
  Administered 2020-12-28: 100 mg via ORAL
  Filled 2020-12-27: qty 1

## 2020-12-27 MED ORDER — ENOXAPARIN SODIUM 40 MG/0.4ML IJ SOSY
40.0000 mg | PREFILLED_SYRINGE | INTRAMUSCULAR | Status: DC
  Administered 2020-12-28: 40 mg via SUBCUTANEOUS
  Filled 2020-12-27: qty 0.4

## 2020-12-27 MED ORDER — SENNA 8.6 MG PO TABS
1.0000 | ORAL_TABLET | Freq: Every day | ORAL | Status: DC
  Administered 2020-12-28: 8.6 mg via ORAL
  Filled 2020-12-27: qty 1

## 2020-12-27 MED ORDER — SENNA 8.6 MG PO TABS: 1.0000 | ORAL_TABLET | Freq: Every day | ORAL | 0 refills | Status: DC

## 2020-12-27 NOTE — ED Provider Notes (Signed)
[place] DEPT Provider Note   CSN: [id number] Arrival date & time: 12/27/20  1853     History No chief complaint on file.   [name] is a 80 y.o. female.  HPI 80 year old female presents after rolling out of bed.  History is a little hard to follow but it sounds like she was using some sort of apparatus at her facility as she has had knee dislocations in her knee operation recently.  She is able to tell me that she was try to get out of bed and was struggling.  She states staff at the facility thought she was found to kill herself.  At first she indicated that they were thinking this but it was not true but when I asked her she states "I do not know" as far as whether or not she was try to kill her self.  She has been depressed when she realized that she cannot ambulate like normal by herself and needs assistance.  Does not think she injured herself but wants to make sure her knee is okay.  Did not hit her head.  Past Medical History:  Diagnosis Date   Anemia    Colon polyp    History of small bowel obstruction 9/09   likely due to adhesions- pt refused most dx and tx in hospital   History of vaginal bleeding    post menopausal- gyn eval, ? polyp   Hyperlipidemia    Osteoarthritis    knee   Schizoaffective disorder    paranoid personality- refuses treatment   Vitamin D deficiency     Patient Active Problem List   Diagnosis Date Noted   History of CVA (cerebrovascular accident) 12/23/2020   UTI (urinary tract infection) 12/23/2020   Right knee dislocation, initial encounter 12/23/2020   Pressure injury of skin 12/23/2020   Dislocated knee, right, recurrent, initial encounter 12/22/2020   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[facility]) 12/10/2020   Knee dislocation, right, initial encounter 12/10/2020   HLD (hyperlipidemia) 12/05/2020   Depression with anxiety 12/05/2020   Coma ([place]) 12/05/2020   Acute encephalopathy    Strangulated  inguinal hernia 12/02/2020   Anxiety 11/25/2020   Aortic valve stenosis 09/09/2020   Prediabetes 05/17/2020   Hypertension 05/10/2020   Age-related osteoporosis without current pathological fracture 01/03/2016   Pure hypercholesterolemia 01/03/2016   Elevated TSH 01/03/2016   Primary osteoarthritis of both knees 12/06/2015   Vitamin D deficiency 06/16/2014   Personal history of other malignant neoplasm of skin 02/19/2014   Prolapse of vaginal vault 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 08/14/2013   Patient on low glycemic diet 08/02/2012   Knee pain 08/02/2012   Gynecological examination 06/20/2011   Neuropathy 06/20/2011   Uterine prolapse 06/20/2011   Anemia 06/18/2009   HYPOKALEMIA, MILD 06/25/2008   INTESTINAL OBSTRUCTION, HX OF 12/11/2007   BLEEDING, POSTMENOPAUSAL 10/23/2006   PERSONALITY DISORDER 10/22/2006   TREMOR, ESSENTIAL 10/22/2006   TRANSAMINASES, SERUM, ELEVATED 10/22/2006    Past Surgical History:  Procedure Laterality Date   ANTERIOR AND POSTERIOR REPAIR N/A 04/23/2012   Procedure: ANTERIOR   REPAIR CYSTOCELE;  Surgeon: Reece Packer, MD;  Location: [facility];  Service: Urology;  Laterality: N/A;  cysto;graft 10x6   APPENDECTOMY     bladder tack  1976   BOWEL RESECTION N/A 12/02/2020   Procedure: SMALL BOWEL RESECTION;  Surgeon: Benjamine Sprague, DO;  Location: [facility];  Service: General;  Laterality: N/A;   INGUINAL HERNIA REPAIR Right 12/02/2020   Procedure: HERNIA REPAIR  INGUINAL ADULT;  Surgeon: Benjamine Sprague, DO;  Location: [facility];  Service: General;  Laterality: Right;   KNEE ARTHROSCOPY     right   KNEE ARTHROSCOPY Right    KNEE CLOSED REDUCTION Right 12/07/2020   Procedure: CLOSED MANIPULATION KNEE;  Surgeon: Lovell Sheehan, MD;  Location: [facility];  Service: Orthopedics;  Laterality: Right;   LAPAROSCOPY  1970's   twisted fallopian tube   SALPINGOOPHORECTOMY Bilateral 04/23/2012   Procedure: SALPINGO OOPHORECTOMY;  Surgeon: Emily Filbert, MD;  Location: [facility];  Service:  Gynecology;  Laterality: Bilateral;   TONSILLECTOMY     TOTAL KNEE REVISION Right 12/24/2020   Procedure: TOTAL KNEE REVISION;  Surgeon: Lovell Sheehan, MD;  Location: [facility];  Service: Orthopedics;  Laterality: Right;   VAGINAL HYSTERECTOMY N/A 04/23/2012   Procedure: HYSTERECTOMY VAGINAL;  Surgeon: Emily Filbert, MD;  Location: [facility];  Service: Gynecology;  Laterality: N/A;   VAGINAL PROLAPSE REPAIR N/A 04/23/2012   Procedure: VAGINAL VAULT SUSPENSION;  Surgeon: Reece Packer, MD;  Location: [facility];  Service: Urology;  Laterality: N/A;     OB History     Gravida  4   Para  3   Term      Preterm      AB  1   Living  3      SAB  1   IAB      Ectopic      Multiple      Live Births              Family History  Problem Relation Age of Onset   Heart disease Mother    Diabetes Mother    Skin cancer Mother    Diabetes Father    Heart disease Father    Hypertension Father    Diabetes Brother    Arthritis Brother    Hypertension Brother    Hyperlipidemia Brother    Arthritis Daughter    Cancer Son    Obesity Daughter     Social History   Tobacco Use   Smoking status: Former    Packs/day: 0.50    Years: 20.00    Pack years: 10.00    Types: Cigarettes    Quit date: 03/13/1982    Years since quitting: 38.8   Smokeless tobacco: Never  Vaping Use   Vaping Use: Never used  Substance Use Topics   Alcohol use: No   Drug use: No    Home Medications Prior to Admission medications   Medication Sig Start Date End Date Taking? Authorizing Provider  acetaminophen (TYLENOL) 325 MG tablet Take 2 tablets (650 mg total) by mouth every 6 (six) hours as needed for fever, headache or mild pain. 12/10/20   Debbe Odea, MD  amLODipine (NORVASC) 10 MG tablet Take 1 tablet (10 mg total) by mouth at bedtime. 12/10/20   Debbe Odea, MD  aspirin 81 MG chewable tablet Chew 1 tablet (81 mg total) by mouth daily. 12/10/20   Debbe Odea, MD  carvedilol (COREG) 6.25 MG  tablet Take 1 tablet (6.25 mg total) by mouth 2 (two) times daily with a meal. 11/25/20   Jon Billings, NP  docusate sodium (COLACE) 100 MG capsule Take 1 capsule (100 mg total) by mouth 2 (two) times daily. Patient taking differently: Take 100 mg by mouth every other day. 12/10/20   Debbe Odea, MD  enoxaparin (LOVENOX) 40 MG/0.4ML injection Inject 0.4 mLs (40 mg total) into the skin daily for 10  days. 12/28/20 01/07/21  Gwynne Edinger, MD  gabapentin (NEURONTIN) 100 MG capsule Take 1 capsule (100 mg total) by mouth at bedtime. Take 1 capsule at bedtime. If no relief, can go up to 2 tablets at bedtime after 3 days. 09/09/20   McElwee, Scheryl Darter, NP  HYDROcodone-acetaminophen (NORCO/VICODIN) 5-325 MG tablet Take 1 tablet by mouth every 4 (four) hours as needed for up to 10 doses for severe pain. 12/27/20   Wouk, Ailene Rud, MD  nystatin (MYCOSTATIN/NYSTOP) powder Apply 1 application topically 3 (three) times daily.    [provider]  polyethylene glycol (MIRALAX / GLYCOLAX) 17 g packet Take 17 g by mouth daily. 12/28/20   Wouk, Ailene Rud, MD  rosuvastatin (CRESTOR) 10 MG tablet Take 1 tablet (10 mg total) by mouth daily. 11/25/20   Jon Billings, NP  senna (SENOKOT) 8.6 MG TABS tablet Take 1 tablet (8.6 mg total) by mouth daily. 12/28/20   Wouk, Ailene Rud, MD  sertraline (ZOLOFT) 25 MG tablet Take 1 tablet (25 mg total) by mouth daily. 11/25/20   Jon Billings, NP  tiZANidine (ZANAFLEX) 4 MG tablet Take 1 tablet (4 mg total) by mouth every 8 (eight) hours as needed for muscle spasms. 12/10/20   Debbe Odea, MD  traMADol (ULTRAM) 50 MG tablet Take 1 tablet (50 mg total) by mouth every 6 (six) hours as needed for moderate pain (mild pain). Patient not taking: No sig reported 12/13/20   Debbe Odea, MD  triamcinolone cream (KENALOG) 0.1 % Apply 1 application topically 2 (two) times daily. 10/12/20   McElwee, Scheryl Darter, NP    Allergies    Penicillins  Review of Systems    Review of Systems  Musculoskeletal:  Positive for arthralgias.  Neurological:  Negative for weakness and headaches.  Psychiatric/Behavioral:  Positive for dysphoric mood and suicidal ideas.   All other systems reviewed and are negative.  Physical Exam Updated Vital Signs BP (!) 176/66   Pulse 74   Temp 97.9 F (36.6 C) (Oral)   Resp 18   Ht 5\' 3"  (1.6 m)   Wt 74 kg   SpO2 98%   BMI 28.90 kg/m   Physical Exam Vitals and nursing note reviewed.  Constitutional:      Appearance: She is well-developed.  HENT:     Head: Normocephalic and atraumatic.     Right Ear: External ear normal.     Left Ear: External ear normal.     Nose: Nose normal.  Eyes:     General:        Right eye: No discharge.        Left eye: No discharge.  Cardiovascular:     Rate and Rhythm: Normal rate and regular rhythm.     Pulses:          Dorsalis pedis pulses are 2+ on the right side.  Pulmonary:     Effort: Pulmonary effort is normal.  Abdominal:     General: There is no distension.  Musculoskeletal:     Comments: Right knee in knee immobilizer. Normal warmth/movement in right foot  Skin:    General: Skin is warm and dry.  Neurological:     Mental Status: She is alert.  Psychiatric:        Mood and Affect: Mood is not anxious.    ED Results / Procedures / Treatments   Labs (all labs ordered are listed, but only abnormal results are displayed) Labs Reviewed  ACETAMINOPHEN LEVEL - Abnormal; Notable  for the following components:      Result Value   Acetaminophen (Tylenol), Serum <10 (*)    All other components within normal limits  COMPREHENSIVE METABOLIC PANEL - Abnormal; Notable for the following components:   Sodium 132 (*)    Glucose, Bld 103 (*)    Creatinine, Ser 0.42 (*)    Total Protein 6.3 (*)    All other components within normal limits  SALICYLATE LEVEL - Abnormal; Notable for the following components:   Salicylate Lvl <0.1 (*)    All other components within normal limits   CBC WITH DIFFERENTIAL/PLATELET - Abnormal; Notable for the following components:   RBC 3.37 (*)    Hemoglobin 10.0 (*)    HCT 29.8 (*)    All other components within normal limits  RESP PANEL BY RT-PCR (FLU A&B, COVID) ARPGX2  ETHANOL  URINALYSIS, ROUTINE W REFLEX MICROSCOPIC  RAPID URINE DRUG SCREEN, HOSP PERFORMED    EKG None  Radiology DG Knee Complete 4 Views Right  Result Date: 12/27/2020 CLINICAL DATA:  Fall, knee pain EXAM: RIGHT KNEE - COMPLETE 4+ VIEW COMPARISON:  12/24/2020 FINDINGS: Changes of right knee replacement. No hardware complicating feature. No acute bony abnormality. No fracture, subluxation or dislocation. IMPRESSION: Right knee replacement.  No acute bony abnormality. Electronically Signed   By: Rolm Baptise M.D.   On: 12/27/2020 21:46    Procedures Procedures   Medications Ordered in ED Medications  acetaminophen (TYLENOL) tablet 650 mg (has no administration in time range)  aspirin chewable tablet 81 mg (has no administration in time range)  amLODipine (NORVASC) tablet 10 mg (has no administration in time range)  carvedilol (COREG) tablet 6.25 mg (has no administration in time range)  docusate sodium (COLACE) capsule 100 mg (has no administration in time range)  enoxaparin (LOVENOX) injection 40 mg (has no administration in time range)  gabapentin (NEURONTIN) capsule 100 mg (has no administration in time range)  HYDROcodone-acetaminophen (NORCO/VICODIN) 5-325 MG per tablet 1 tablet (has no administration in time range)  polyethylene glycol (MIRALAX / GLYCOLAX) packet 17 g (has no administration in time range)  rosuvastatin (CRESTOR) tablet 10 mg (has no administration in time range)  senna (SENOKOT) tablet 8.6 mg (has no administration in time range)  sertraline (ZOLOFT) tablet 25 mg (has no administration in time range)  tiZANidine (ZANAFLEX) tablet 4 mg (has no administration in time range)    ED Course  I have reviewed the triage vital signs and  the nursing notes.  Pertinent labs & imaging results that were available during my care of the patient were reviewed by me and considered in my medical decision making (see chart for details).    MDM Rules/Calculators/A&P                           Patient is being vague about her depression/suicidal thoughts.  I am concerned that she is truly depressed due to her newfound disability with the knee issues.  Knee x-ray is unremarkable, no dislocation.  Labs are overall unremarkable.  Will restart home meds and consult TTS.  The patient has been placed in psychiatric observation due to the need to provide a safe environment for the patient while obtaining psychiatric consultation and evaluation, as well as ongoing medical and medication management to treat the patient's condition.  The patient has not been placed under full IVC at this time.  Final Clinical Impression(s) / ED Diagnoses Final diagnoses:  Depression, unspecified  depression type    Rx / DC Orders ED Discharge Orders     None        Sherwood Gambler, MD 12/27/20 2311

## 2020-12-27 NOTE — TOC Progression Note (Signed)
Transition of Care [facility]) - Progression Note    Patient Details  Name: [name] MRN: [id number] Date of Birth: 06/14/1940  Transition of Care [facility]) CM/SW [place], RN Phone Number: 12/27/2020, 11:25 AM  Clinical Narrative:      The patient will go to room 703P,  Bedside nurse to call report to [phone], transport to pick up at 2 PM, daughter  Caryl Pina is aware     Expected Discharge Plan and Services           Expected Discharge Date: 12/27/20                                     Social Determinants of Health (SDOH) Interventions    Readmission Risk Interventions No flowsheet data found.

## 2020-12-27 NOTE — Care Management Important Message (Signed)
Important Message  Patient Details  Name: [name] MRN: [id number] Date of Birth: 11/23/1940   Medicare Important Message Given:  Yes     Keon Benscoter, Leroy Sea 12/27/2020, 2:00 PM

## 2020-12-27 NOTE — TOC Progression Note (Addendum)
Transition of Care [facility]) - Progression Note    Patient Details  Name: [name] MRN: [id number] Date of Birth: 06-Dec-1940  Transition of Care [facility]) CM/SW [place], RN Phone Number: 12/27/2020, 8:59 AM  Clinical Narrative:     Called and left a voice mail for admission director at [facility], Requested a call back, Insurance has been approved [employer] number [id number], navi ID [id number] start date 10/17- 10/19, awaiting aq call back from [facility]   Update [facility] called back and accepted the patient and she is able to return today    Expected Discharge Plan and Services                                                 Social Determinants of Health (SDOH) Interventions    Readmission Risk Interventions No flowsheet data found.

## 2020-12-27 NOTE — Discharge Summary (Signed)
[name] JKD:[id number] DOB: April 18, 1940 DOA: 12/22/2020  PCP: Jon Billings, NP  Admit date: 12/22/2020 Discharge date: 12/27/2020  Time spent: 35 minutes  Recommendations for Outpatient Follow-up:  Skilled nursing facility will need to contact Dr. Kurtis Bushman (orthopedic surgery) to arrange for post-op follow-up visit    Discharge Diagnoses:  Principal Problem:   Dislocated knee, right, recurrent, initial encounter Active Problems:   Hypertension   Depression with anxiety   History of CVA (cerebrovascular accident)   UTI (urinary tract infection)   Right knee dislocation, initial encounter   Pressure injury of skin   Discharge Condition: stable  Diet recommendation: heart healthy  Filed Weights   12/24/20 1204  Weight: 74.8 kg    History of present illness:  [name] is a 80 y.o. female with medical history significant for HTN, , depression with anxiety, recently hospitalized from 9/38-10/3 for strangulated hernia undergoing small bowel resection on 9/22, with visit complicated by code stroke in which she was found to have bilateral cerebral hemispheric watershed infarcts related to postop hypotension, further complicated by a right knee dislocation s/p reduction on 9/27 and again on 10/1, discharge with knee immobilizer to remain on at all times, who was transferred from the ED at [facility] due to recurrent right knee dislocation following a fall out of bed several days prior at the rehab facility.  Patient was also incidentally found to have a UTI and was started on Rocephin prior to transfer  Hospital Course:  # Recurrent right knee dislocation S/p revision of right total knee arthroplasty on 10/14 w/ Dr. Harlow Mares.  - maintain knee immobilizer, wbat - hydrocodone for pain - d/c to snf - 10 days lovenox - outpt ortho f/u   # Pyuria Patient denies suprapubic pain or dysuria but does endorse several months frequency. Urine culture ordered  on 10/13 but lab says that wasn't performed and now no sample to test. Was treated w/ fosfomycin x1. No new uti symptoms   # Hx CVA Stable. Recent, possibly 2/2 post-op hypotension (though no sig post-op hypotension documented) - cont asa, statin   # HTN Here bp wnl - cont home coreg - will resume home amlodipine - resume home lisinopril as needed   # MDD - home sertraline  Procedures: Revision right knee arthroplasty  Consultations: orthopedics  Discharge Exam: Vitals:   12/27/20 0443 12/27/20 0730  BP: (!) 160/68 (!) 159/61  Pulse: 74 79  Resp: 18 14  Temp: 97.9 F (36.6 C) 98 F (36.7 C)  SpO2: 97% 99%    General exam: Appears calm and in some pain Respiratory system: Clear to auscultation. Respiratory effort normal. Cardiovascular system: S1 & S2 heard, RRR. No JVD, murmurs, rubs, gallops or clicks. No pedal edema. Gastrointestinal system: Abdomen is nondistended, soft and nontender. No organomegaly or masses felt. Normal bowel sounds heard. Central nervous system: Alert and oriented. Left sided weakness Extremities: warm, immobilizer on right leg Skin: stage 1 sacral decub Psychiatry: calm    Discharge Instructions   Discharge Instructions     Diet - low sodium heart healthy   Complete by: As directed    Increase activity slowly   Complete by: As directed    Leave dressing on - Keep it clean, dry, and intact until clinic visit   Complete by: As directed       Allergies as of 12/27/2020       Reactions   Penicillins Rash   TOLERATED CEFAZOLIN  Medication List     STOP taking these medications    lisinopril 40 MG tablet Commonly known as: ZESTRIL       TAKE these medications    acetaminophen 325 MG tablet Commonly known as: TYLENOL Take 2 tablets (650 mg total) by mouth every 6 (six) hours as needed for fever, headache or mild pain.   amLODipine 10 MG tablet Commonly known as: NORVASC Take 1 tablet (10 mg total) by mouth at  bedtime.   aspirin 81 MG chewable tablet Chew 1 tablet (81 mg total) by mouth daily.   carvedilol 6.25 MG tablet Commonly known as: COREG Take 1 tablet (6.25 mg total) by mouth 2 (two) times daily with a meal.   docusate sodium 100 MG capsule Commonly known as: COLACE Take 1 capsule (100 mg total) by mouth 2 (two) times daily. What changed: when to take this   enoxaparin 40 MG/0.4ML injection Commonly known as: LOVENOX Inject 0.4 mLs (40 mg total) into the skin daily for 10 days. Start taking on: December 28, 2020   gabapentin 100 MG capsule Commonly known as: NEURONTIN Take 1 capsule (100 mg total) by mouth at bedtime. Take 1 capsule at bedtime. If no relief, can go up to 2 tablets at bedtime after 3 days.   HYDROcodone-acetaminophen 5-325 MG tablet Commonly known as: NORCO/VICODIN Take 1 tablet by mouth every 4 (four) hours as needed for up to 10 doses for severe pain.   nystatin powder Commonly known as: MYCOSTATIN/NYSTOP Apply 1 application topically 3 (three) times daily.   polyethylene glycol 17 g packet Commonly known as: MIRALAX / GLYCOLAX Take 17 g by mouth daily. Start taking on: December 28, 2020   rosuvastatin 10 MG tablet Commonly known as: Crestor Take 1 tablet (10 mg total) by mouth daily.   senna 8.6 MG Tabs tablet Commonly known as: SENOKOT Take 1 tablet (8.6 mg total) by mouth daily. Start taking on: December 28, 2020   sertraline 25 MG tablet Commonly known as: ZOLOFT Take 1 tablet (25 mg total) by mouth daily.   tiZANidine 4 MG tablet Commonly known as: ZANAFLEX Take 1 tablet (4 mg total) by mouth every 8 (eight) hours as needed for muscle spasms.   traMADol 50 MG tablet Commonly known as: ULTRAM Take 1 tablet (50 mg total) by mouth every 6 (six) hours as needed for moderate pain (mild pain).   triamcinolone cream 0.1 % Commonly known as: KENALOG Apply 1 application topically 2 (two) times daily.               Discharge Care  Instructions  (From admission, onward)           Start     Ordered   12/27/20 0000  Leave dressing on - Keep it clean, dry, and intact until clinic visit        12/27/20 1051           Allergies  Allergen Reactions   Penicillins Rash    TOLERATED CEFAZOLIN    Contact information for follow-up providers     Lovell Sheehan, MD Follow up.   Specialty: Orthopedic Surgery Why: call to schedule post-op follow-up Contact information: [place] [phone]              Contact information for after-discharge care     Destination     [facility] .   Service: Skilled Nursing Contact information: [place]  [phone]                      The results of significant diagnostics from this hospitalization (including imaging, microbiology, ancillary and laboratory) are listed below for reference.    Significant Diagnostic Studies: CT ANGIO HEAD NECK W WO CM  Result Date: 12/04/2020 CLINICAL DATA:  Neuro deficit, acute, stroke suspected. Unresponsive. EXAM: CT ANGIOGRAPHY HEAD AND NECK TECHNIQUE: Multidetector CT imaging of the head and neck was performed using the standard protocol during bolus administration of intravenous contrast. Multiplanar CT image reconstructions and MIPs were obtained to evaluate the vascular anatomy. Carotid stenosis measurements (when applicable) are obtained utilizing NASCET criteria, using the distal internal carotid diameter as the denominator. CONTRAST:  87mL OMNIPAQUE IOHEXOL 350 MG/ML SOLN COMPARISON:  No pertinent prior exams available for comparison. FINDINGS: CT HEAD FINDINGS Brain: Mild generalized cerebral atrophy. A subtle acute cortical infarct is questioned within the right parietal lobe (for instance as seen on series 3, image 27). Background advanced patchy and ill-defined hypoattenuation within the cerebral white matter, nonspecific  but compatible with chronic small vessel ischemic disease. There is no acute intracranial hemorrhage. No extra-axial fluid collection. No evidence of an intracranial mass. No midline shift. Vascular: No hyperdense vessel.  Atherosclerotic calcifications. Skull: Normal. Negative for fracture or focal lesion. Sinuses: No significant paranasal sinus disease. Orbits: No mass or acute finding. Review of the MIP images confirms the above findings These results were called by telephone at the time of interpretation on 12/04/2020 at 12:55 pm to provider ERIC [facility] , who verbally acknowledged these results. CTA NECK FINDINGS Aortic arch: Standard aortic branching. Atherosclerotic plaque within the visualized aortic arch and proximal major branch vessels of the neck. No hemodynamically significant innominate or proximal subclavian artery stenosis. Right carotid system: CCA and ICA patent within the neck without hemodynamically significant stenosis (50% or greater). Mild-to-moderate atherosclerotic plaque within the carotid bifurcation and proximal ICA. Left carotid system: CCA and ICA patent within the neck without hemodynamically significant stenosis (50% or greater). Moderate atherosclerotic plaque within the carotid bifurcation and proximal ICA. Vertebral arteries: Vertebral arteries patent within the neck bilaterally. Mild atherosclerotic narrowing at the origin of the right vertebral artery. Skeleton: Cervical spondylosis. No acute bony abnormality or aggressive osseous lesion. Other neck: No neck mass or cervical lymphadenopathy. Upper chest: Patchy opacity within the imaged left lower lobe pneumonia or atelectasis. Review of the MIP images confirms the above findings CTA HEAD FINDINGS Anterior circulation: The intracranial internal carotid arteries are patent. Atherosclerotic plaque within both vessels with up to moderate stenosis. The M1 middle cerebral arteries are patent. Significant atherosclerotic irregularity of  the M2 and more distal middle cerebral artery vessels bilaterally. Most notably, there are sites of up to severe stenosis within the proximal M2 right MCA vessels. No proximal M2 branch occlusion is identified. The anterior cerebral arteries are patent. 2 mm inferiorly projecting vascular protrusion arising from the supraclinoid left ICA, likely reflecting an aneurysm. Posterior circulation: The intracranial vertebral arteries are patent. The basilar artery is patent. The posterior cerebral arteries are patent. Hypoplastic right P1 segment with sizable right posterior communicating artery. The left posterior communicating artery is hypoplastic or absent Venous sinuses: Within the limitations of contrast timing, no convincing thrombus. Anatomic variants: As described Review of the MIP images confirms the above findings No intracranial large vessel occlusion identified. These results were called by telephone at the time of interpretation on 12/04/2020 at 12:56 pm to provider ERIC  [facility] , who verbally acknowledged these results. IMPRESSION: CT head: 1. No evidence of acute intracranial hemorrhage. 2. A small acute cortical infarct is questioned within the right parietal lobe. 3. Advanced chronic small vessel ischemic changes within the cerebral white matter. 4. Generalized parenchymal atrophy. CTA neck: 1. The common carotid, internal carotid and vertebral arteries are patent within the neck without hemodynamically significant stenosis (50% or greater). 2. Opacity within the imaged left lower lobe, which may reflect atelectasis or pneumonia. CTA Head: 1. No intracranial large vessel occlusion identified. 2. Atherosclerotic irregularity of the M2 and more distal middle cerebral artery vessels bilaterally. Most notably, there are sites of up to severe stenosis within proximal right M2 MCA vessels. 3. 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 Electronically Signed   By: Kellie Simmering D.O.   On:  12/04/2020 13:07   DG Wrist 2 Views Left  Result Date: 12/23/2020 CLINICAL DATA:  Left wrist pain after a fall EXAM: LEFT WRIST - 2 VIEW COMPARISON:  None. FINDINGS: Multiple circumscribed cystic bone lesions demonstrated in the base of the first metacarpal bone, in multiple carpal bones, including the hamate, capitate, scaphoid, lunate, and triquetral bones, as well as the distal radius and ulna. These are most likely degenerative cysts. Enchondromas would be a less likely consideration. No expansile changes are identified. There is bone resorption and deformity of the trapezium which is likely degenerative. No evidence of acute fracture or dislocation. Dorsal soft tissue swelling. IMPRESSION: No definite acute fracture or dislocation. Chronic changes as described, likely degenerative. Dorsal soft tissue swelling. Electronically Signed   By: Lucienne Capers M.D.   On: 12/23/2020 20:23   DG Knee 1-2 Views Right  Result Date: 12/06/2020 CLINICAL DATA:  RIGHT knee dislocation EXAM: RIGHT KNEE - 1-2 VIEW COMPARISON:  None. FINDINGS: Lateral projection demonstrates anterior dislocation of the femoral component in relation to the tibial component. No evidence of fracture. IMPRESSION: Anterior dislocation the knee joint. Electronically Signed   By: Suzy Bouchard M.D.   On: 12/06/2020 14:46   DG Abd 1 View  Result Date: 12/07/2020 CLINICAL DATA:  Ileus EXAM: ABDOMEN - 1 VIEW COMPARISON:  12/02/2020 FINDINGS: Cutaneous staples over the low pelvic region. Overall nonobstructed gas pattern with resolution of previously noted dilated small bowel. Aortic atherosclerosis. No suspicious calcifications. IMPRESSION: Nonobstructed bowel-gas pattern Electronically Signed   By: Donavan Foil M.D.   On: 12/07/2020 19:50   DG Abd 1 View  Result Date: 12/02/2020 CLINICAL DATA:  NG tube placement. EXAM: ABDOMEN - 1 VIEW COMPARISON:  CT earlier today. FINDINGS: Placement of enteric tube, the tip and side port are  within a large hiatal hernia in the lower thorax. Dilated small bowel in the central abdomen similar to CT earlier today. IMPRESSION: Enteric tube with tip and side-port in the large hiatal hernia in the lower thorax. Dilated small bowel in the central abdomen similar to CT earlier today. Electronically Signed   By: Keith Rake M.D.   On: 12/02/2020 21:23   CT HEAD WO CONTRAST  Result Date: 12/22/2020 CLINICAL DATA:  Fall, facial trauma EXAM: CT HEAD WITHOUT CONTRAST CT CERVICAL SPINE WITHOUT CONTRAST TECHNIQUE: Multidetector CT imaging of the head and cervical spine was performed following the standard protocol without intravenous contrast. Multiplanar CT image reconstructions of the cervical spine were also generated. COMPARISON:  None. FINDINGS: CT HEAD FINDINGS Brain: No evidence of acute infarction, hemorrhage, hydrocephalus, extra-axial collection or mass lesion/mass effect. Periventricular and deep white matter  hypodensity. Vascular: No hyperdense vessel or unexpected calcification. Skull: Normal. Negative for fracture or focal lesion. Sinuses/Orbits: No acute finding. Other: None. CT CERVICAL SPINE FINDINGS Alignment: Normal. Skull base and vertebrae: No acute fracture. No primary bone lesion or focal pathologic process. Severe atlantoaxial arthrosis. Soft tissues and spinal canal: No prevertebral fluid or swelling. No visible canal hematoma. Disc levels: Moderate multilevel disc space height loss and osteophytosis. Upper chest: Negative. Other: None. IMPRESSION: 1. No acute intracranial pathology. Small-vessel white matter disease. 2. No fracture or static subluxation of the cervical spine. 3. Moderate multilevel cervical disc degenerative disease. Severe atlantoaxial arthrosis. Electronically Signed   By: Delanna Ahmadi M.D.   On: 12/22/2020 15:04   CT CERVICAL SPINE WO CONTRAST  Result Date: 12/22/2020 CLINICAL DATA:  Fall, facial trauma EXAM: CT HEAD WITHOUT CONTRAST CT CERVICAL SPINE  WITHOUT CONTRAST TECHNIQUE: Multidetector CT imaging of the head and cervical spine was performed following the standard protocol without intravenous contrast. Multiplanar CT image reconstructions of the cervical spine were also generated. COMPARISON:  None. FINDINGS: CT HEAD FINDINGS Brain: No evidence of acute infarction, hemorrhage, hydrocephalus, extra-axial collection or mass lesion/mass effect. Periventricular and deep white matter hypodensity. Vascular: No hyperdense vessel or unexpected calcification. Skull: Normal. Negative for fracture or focal lesion. Sinuses/Orbits: No acute finding. Other: None. CT CERVICAL SPINE FINDINGS Alignment: Normal. Skull base and vertebrae: No acute fracture. No primary bone lesion or focal pathologic process. Severe atlantoaxial arthrosis. Soft tissues and spinal canal: No prevertebral fluid or swelling. No visible canal hematoma. Disc levels: Moderate multilevel disc space height loss and osteophytosis. Upper chest: Negative. Other: None. IMPRESSION: 1. No acute intracranial pathology. Small-vessel white matter disease. 2. No fracture or static subluxation of the cervical spine. 3. Moderate multilevel cervical disc degenerative disease. Severe atlantoaxial arthrosis. Electronically Signed   By: Delanna Ahmadi M.D.   On: 12/22/2020 15:04   CT Knee Right Wo Contrast  Result Date: 12/22/2020 CLINICAL DATA:  Fracture, knee s/p fall, per ortho. EXAM: CT OF THE RIGHT KNEE WITHOUT CONTRAST TECHNIQUE: Multidetector CT imaging of the RIGHT knee was performed according to the standard protocol. Multiplanar CT image reconstructions were also generated. COMPARISON:  Plain radiographs completed at 11:54 a.m. FINDINGS: Bones/Joint/Cartilage Right total knee arthroplasty has been performed. Streak artifact slightly limits the examination. There is posterior dislocation and mild override of the tibia in relation to the distal femur. No associated fracture. No lytic lesion. Ligaments  Suboptimally assessed by CT. Muscles and Tendons Extensor mechanism appears intact. Mild fatty atrophy diffusely the visualized right lower extremity musculature. Soft tissues Small right knee effusion. Mild prepatellar soft tissue swelling. Vascular calcifications noted within the right lower extremity runoff. Patency of the vasculature is not well assessed on this noncontrast examination. IMPRESSION: Status post right total knee arthroplasty. Posterior right knee dislocation. No associated fracture. Small effusion. Electronically Signed   By: Fidela Salisbury M.D.   On: 12/22/2020 21:09   MR ANGIO HEAD WO CONTRAST  Result Date: 12/04/2020 CLINICAL DATA:  Initial evaluation for neuro deficit, stroke suspected. EXAM: MRI HEAD WITHOUT CONTRAST MRA HEAD WITHOUT CONTRAST TECHNIQUE: Multiplanar, multi-echo pulse sequences of the brain and surrounding structures were acquired without intravenous contrast. Angiographic images of the Circle of Willis were acquired using MRA technique without intravenous contrast. COMPARISON:  CTA from earlier the same day. FINDINGS: MRI HEAD FINDINGS Brain: Cerebral volume within normal limits for age. Patchy T2/FLAIR hyperintensity involving the periventricular deep white matter both cerebral hemispheres as well as the pons,  most consistent with chronic small vessel ischemic disease, moderate in nature. Scattered areas of restricted diffusion involving the cortical and subcortical aspect of the right frontal, parietal, and occipital lobes, consistent with acute ischemic infarcts. These are positioned in a linear watershed distribution. There are similar but less pronounced watershed type infarcts involving the contralateral left cerebral hemisphere, with involvement of the left frontal and parietal lobes. No associated hemorrhage or mass effect. No other evidence for acute or chronic intracranial hemorrhage. No mass lesion, midline shift or mass effect. No hydrocephalus or extra-axial  fluid collection. Pituitary gland suprasellar region normal. Midline structures intact. Vascular: Major intracranial vascular flow voids are maintained. Skull and upper cervical spine: Prominent osteoarthritic changes with degenerative thickening noted about the C1-2 articulation and tectorial membrane. No significant stenosis at the craniocervical junction. Bone marrow signal intensity within normal limits. No scalp soft tissue abnormality. Sinuses/Orbits: Globes and orbital soft tissues within normal limits. Paranasal sinuses are clear. No mastoid effusion. Inner ear structures grossly normal. Other: None. MRA HEAD FINDINGS Anterior circulation: Examination degraded by motion artifact. Visualized distal cervical segments of the internal carotid arteries are patent with antegrade flow. Petrous, cavernous, and supraclinoid segments patent without hemodynamically significant stenosis. 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 A1 segments patent. Normal anterior communicating artery complex. Anterior cerebral arteries patent without significant stenosis. No M1 stenosis or occlusion. Normal MCA bifurcations. Distal MCA branches perfused and symmetric. Distal small vessel atheromatous irregularity, better evaluated on prior CTA. Posterior circulation: Vertebral arteries widely patent to the vertebrobasilar junction. Right vertebral artery dominant. Both PICA origins patent and normal. Basilar patent to its distal aspect without stenosis. Superior cerebellar arteries patent bilaterally. Left PCA supplied via the basilar. Right PCA supplied via a hypoplastic right P1 segment and robust right posterior communicating artery. Both PCAs remain patent to their distal aspects without appreciable stenosis. Distal small vessel atheromatous irregularity. Anatomic variants: Hypoplastic right P1 segment and robust right posterior communicating artery. IMPRESSION: MRI HEAD  IMPRESSION: 1. 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 No associated hemorrhage or mass effect. 2. Underlying moderately advanced chronic microvascular ischemic disease. MRA HEAD IMPRESSION: 1. Negative intracranial MRA for large vessel occlusion. No hemodynamically significant or correctable stenosis. 2. Distal small vessel atheromatous irregularity, better evaluated on prior CTA. 3. 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 Electronically Signed   By: Jeannine Boga M.D.   On: 12/04/2020 21:22   MR BRAIN WO CONTRAST  Result Date: 12/04/2020 CLINICAL DATA:  Initial evaluation for neuro deficit, stroke suspected. EXAM: MRI HEAD WITHOUT CONTRAST MRA HEAD WITHOUT CONTRAST TECHNIQUE: Multiplanar, multi-echo pulse sequences of the brain and surrounding structures were acquired without intravenous contrast. Angiographic images of the Circle of Willis were acquired using MRA technique without intravenous contrast. COMPARISON:  CTA from earlier the same day. FINDINGS: MRI HEAD FINDINGS Brain: Cerebral volume within normal limits for age. Patchy T2/FLAIR hyperintensity involving the periventricular deep white matter both cerebral hemispheres as well as the pons, most consistent with chronic small vessel ischemic disease, moderate in nature. Scattered areas of restricted diffusion involving the cortical and subcortical aspect of the right frontal, parietal, and occipital lobes, consistent with acute ischemic infarcts. These are positioned in a linear watershed distribution. There are similar but less pronounced watershed type infarcts involving the contralateral left cerebral hemisphere, with involvement  of the left frontal and parietal lobes. No associated hemorrhage or mass effect. No other evidence for acute or chronic intracranial hemorrhage. No mass lesion,  midline shift or mass effect. No hydrocephalus or extra-axial fluid collection. Pituitary gland suprasellar region normal. Midline structures intact. Vascular: Major intracranial vascular flow voids are maintained. Skull and upper cervical spine: Prominent osteoarthritic changes with degenerative thickening noted about the C1-2 articulation and tectorial membrane. No significant stenosis at the craniocervical junction. Bone marrow signal intensity within normal limits. No scalp soft tissue abnormality. Sinuses/Orbits: Globes and orbital soft tissues within normal limits. Paranasal sinuses are clear. No mastoid effusion. Inner ear structures grossly normal. Other: None. MRA HEAD FINDINGS Anterior circulation: Examination degraded by motion artifact. Visualized distal cervical segments of the internal carotid arteries are patent with antegrade flow. Petrous, cavernous, and supraclinoid segments patent without hemodynamically significant stenosis. 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 A1 segments patent. Normal anterior communicating artery complex. Anterior cerebral arteries patent without significant stenosis. No M1 stenosis or occlusion. Normal MCA bifurcations. Distal MCA branches perfused and symmetric. Distal small vessel atheromatous irregularity, better evaluated on prior CTA. Posterior circulation: Vertebral arteries widely patent to the vertebrobasilar junction. Right vertebral artery dominant. Both PICA origins patent and normal. Basilar patent to its distal aspect without stenosis. Superior cerebellar arteries patent bilaterally. Left PCA supplied via the basilar. Right PCA supplied via a hypoplastic right P1 segment and robust right posterior communicating artery. Both PCAs remain patent to their distal aspects without appreciable stenosis. Distal small vessel atheromatous irregularity. Anatomic variants: Hypoplastic right P1 segment and robust  right posterior communicating artery. IMPRESSION: MRI HEAD IMPRESSION: 1. 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 No associated hemorrhage or mass effect. 2. Underlying moderately advanced chronic microvascular ischemic disease. MRA HEAD IMPRESSION: 1. Negative intracranial MRA for large vessel occlusion. No hemodynamically significant or correctable stenosis. 2. Distal small vessel atheromatous irregularity, better evaluated on prior CTA. 3. 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 Electronically Signed   By: Jeannine Boga M.D.   On: 12/04/2020 21:22   CT ABDOMEN PELVIS W CONTRAST  Result Date: 12/02/2020 CLINICAL DATA:  Abdominal pain, acute, nonlocalized. EXAM: CT ABDOMEN AND PELVIS WITH CONTRAST TECHNIQUE: Multidetector CT imaging of the abdomen and pelvis was performed using the standard protocol following bolus administration of intravenous contrast. CONTRAST:  35mL OMNIPAQUE IOHEXOL 350 MG/ML SOLN COMPARISON:  12/25/2011 FINDINGS: Lower chest: Lung bases are clear. Large hiatal hernia. There is a right Bochdalek hernia containing fat in the posterior right chest. No pleural effusions. Hepatobiliary: Normal appearance of the liver, gallbladder and portal venous system. No biliary dilatation. Pancreas: Unremarkable. No pancreatic ductal dilatation or surrounding inflammatory changes. Spleen: Normal in size without focal abnormality. Adrenals/Urinary Tract: Normal adrenal glands. Bilateral low-density renal cysts. However, there is a hyperdense exophytic structure along the anterior left kidney on sequence 2 image 35 that measures up to 1.0 cm. This may represent a hyperdense cyst due to the fact that the Hounsfield units do not significantly change on the delayed imaging but this lesion is technically indeterminate. Negative for hydronephrosis. Mild  distention of the urinary bladder. Stomach/Bowel: Large hiatal hernia. Dilated loops of small bowel in the abdomen and pelvis. Dilated loop of bowel extending into a right inguinal hernia and there is a transition point associated with this  inguinal hernia. Stranding associated with this right inguinal hernia. Findings are suggestive for an incarcerated inguinal hernia. Distal small bowel is decompressed. Colon is decompressed. Vascular/Lymphatic: Diffuse atherosclerotic disease in the abdominal aorta without aneurysm. Mildly prominent right inguinal lymph nodes that may be reactive. Right iliac lymph node on series 2, image 57 measures 0.8 cm in the short axis. Otherwise, no significant abdominal or pelvic lymph node enlargement. Reproductive: Status post hysterectomy. No adnexal masses. Other: Bilateral inguinal hernias. Right inguinal hernia contains a dilated loop of small bowel. Left inguinal hernia contains fat. Negative for free air. Negative for ascites. Musculoskeletal: Small amount of stranding in the right groin and around the right inguinal hernia. Grade 1 anterolisthesis of L4 on L5 likely secondary to facet arthropathy. Disc space loss with endplate changes at B0-F7. IMPRESSION: 1. Small bowel obstruction secondary to an incarcerated right inguinal hernia. Subcutaneous edema in the right groin and right inguinal region likely secondary to the incarcerated hernia. 2. Bilateral renal cysts with an indeterminate hyperdense structure in the anterior left kidney. This could be more definitively characterized with a pre and post-contrast MRI. 3.  Aortic Atherosclerosis (ICD10-I70.0). 4. Left inguinal hernia containing fat. 5. Large hiatal hernia. 6. Right Bochdalek diaphragmatic hernia. These results were called by telephone at the time of interpretation on 12/02/2020 at 2:16 pm to provider [facility] , who verbally acknowledged these results. Electronically Signed   By: Markus Daft M.D.   On:  12/02/2020 14:17   DG Pelvis Portable  Result Date: 12/22/2020 CLINICAL DATA:  Fall, knee arthroplasty dislocation EXAM: PORTABLE PELVIS 1-2 VIEWS COMPARISON:  None. FINDINGS: Osteopenia. There is no evidence of displaced pelvic fracture or diastasis. No pelvic bone lesions are seen. IMPRESSION: Osteopenia. No displaced or dislocation of the bilateral hips and pelvis seen in frontal view only. Please note that plain radiographs are insensitive for hip and pelvic fracture. Electronically Signed   By: Delanna Ahmadi M.D.   On: 12/22/2020 15:00   DG Chest Port 1 View  Result Date: 12/04/2020 CLINICAL DATA:  Dyspnea EXAM: PORTABLE CHEST 1 VIEW COMPARISON:  None. Findings are correlated with CT examination of the abdomen pelvis of 12/02/2020 FINDINGS: Lung volumes are small. No pneumothorax or pleural effusion. Right paratracheal soft tissue thickening may reflect vascular shadow on this rotated examination, however, a paramediastinal soft tissue mass or adenopathy could appear similarly. This is not well characterized on this examination. Cardiac size is within normal limits. Opacity within the right cardiophrenic angle may relate to the patient's known large hiatal hernia. Nasogastric tube appears looped within the inferior thorax, likely within the known hiatal hernia. IMPRESSION: Pulmonary hypoinflation. Nasogastric tube looped within the inferior thorax, likely within known large hiatal hernia. Right paratracheal soft tissue opacity, not well characterized, possibly artifactual and related to rotation. This would be better assessed with a standard two view chest radiograph or CT examination when the patient is clinically able to do so. Electronically Signed   By: Fidela Salisbury M.D.   On: 12/04/2020 21:41   DG Knee Complete 4 Views Right  Result Date: 12/22/2020 CLINICAL DATA:  Right knee dislocation. EXAM: RIGHT KNEE - COMPLETE 4+ VIEW COMPARISON:  None. FINDINGS: Three-view exam shows anterior  dislocation of the femur relative to the proximal tibia in this patient status post tricompartmental knee replacement. No associated periprosthetic fracture evident on the provided images. Bones are diffusely demineralized. IMPRESSION: Anterior dislocation of the femur relative to the proximal tibia in this patient status post tricompartmental knee  replacement. Electronically Signed   By: Misty Stanley M.D.   On: 12/22/2020 12:37   DG Knee Right Port  Result Date: 12/24/2020 CLINICAL DATA:  Post revision of knee replacement EXAM: PORTABLE RIGHT KNEE - 1-2 VIEW COMPARISON:  12/22/2020 FINDINGS: Post revision right total knee arthroplasty with long-stem femoral component. Postoperative soft tissue swelling and air. No evidence of complication. IMPRESSION: Post revision right total knee arthroplasty. Electronically Signed   By: Macy Mis M.D.   On: 12/24/2020 17:23   DG Knee Right Port  Result Date: 12/07/2020 CLINICAL DATA:  Surgical manipulation of right knee joint EXAM: PORTABLE RIGHT KNEE - 1-2 VIEW COMPARISON:  12/06/2020 FINDINGS: Frontal and cross-table lateral views of the right knee demonstrate reduction of prior dislocation. The 3 component right knee arthroplasty demonstrates anatomic alignment. No evidence of fracture. Small joint effusion. IMPRESSION: 1. Reduction of prior right knee arthroplasty dislocation, now with anatomic alignment. 2. Small joint effusion. Electronically Signed   By: Randa Ngo M.D.   On: 12/07/2020 17:52   CT MAXILLOFACIAL WO CONTRAST  Result Date: 12/12/2020 CLINICAL DATA:  Pain.  Fall from bed. EXAM: CT MAXILLOFACIAL WITHOUT CONTRAST TECHNIQUE: Multidetector CT imaging of the maxillofacial structures was performed. Multiplanar CT image reconstructions were also generated. COMPARISON:  Head MRI 12/04/2020.  Head and neck CTA 12/04/2020. FINDINGS: Osseous: No acute fracture or mandibular dislocation. Bilateral TMJ arthropathy with advanced joint space narrowing  and condylar head flattening. Advanced C1-2 arthropathy and cervical disc degeneration. Orbits: Unremarkable. Sinuses: Paranasal sinuses and mastoid air cells are clear. Leftward nasal septal deviation. Soft tissues: Calcific atherosclerotic calcification at the left greater than right carotid bifurcations. Limited intracranial: Cerebral atrophy and advanced chronic small vessel ischemia in the cerebral white matter, more fully evaluated on the recent MRI. IMPRESSION: No acute maxillofacial fracture. Electronically Signed   By: Logan Bores M.D.   On: 12/12/2020 17:41    Microbiology: Recent Results (from the past 240 hour(s))  Resp Panel by RT-PCR (Flu A&B, Covid) Nasopharyngeal Swab     Status: None   Collection Time: 12/22/20  2:14 PM   Specimen: Nasopharyngeal Swab; Nasopharyngeal(NP) swabs in vial transport medium  Result Value Ref Range Status   SARS Coronavirus 2 by RT PCR NEGATIVE NEGATIVE Final    Comment: (NOTE) SARS-CoV-2 target nucleic acids are NOT DETECTED.  The SARS-CoV-2 RNA is generally detectable in upper respiratory specimens during the acute phase of infection. The lowest concentration of SARS-CoV-2 viral copies this assay can detect is 138 copies/mL. A negative result does not preclude SARS-Cov-2 infection and should not be used as the sole basis for treatment or other patient management decisions. A negative result may occur with  improper specimen collection/handling, submission of specimen other than nasopharyngeal swab, presence of viral mutation(s) within the areas targeted by this assay, and inadequate number of viral copies(<138 copies/mL). A negative result must be combined with clinical observations, patient history, and epidemiological information. The expected result is Negative.  Fact Sheet for Patients:  EntrepreneurPulse.com.au  Fact Sheet for Healthcare Providers:  IncredibleEmployment.be  This test is no t yet  approved or cleared by the [place] FDA and  has been authorized for detection and/or diagnosis of SARS-CoV-2 by FDA under an Emergency Use Authorization (EUA). This EUA will remain  in effect (meaning this test can be used) for the duration of the COVID-19 declaration under Section 564(b)(1) of the Act, 21 U.S.C.section 360bbb-3(b)(1), unless the authorization is terminated  or revoked sooner.  Influenza A by PCR NEGATIVE NEGATIVE Final   Influenza B by PCR NEGATIVE NEGATIVE Final    Comment: (NOTE) The Xpert Xpress SARS-CoV-2/FLU/RSV plus assay is intended as an aid in the diagnosis of influenza from Nasopharyngeal swab specimens and should not be used as a sole basis for treatment. Nasal washings and aspirates are unacceptable for Xpert Xpress SARS-CoV-2/FLU/RSV testing.  Fact Sheet for Patients: EntrepreneurPulse.com.au  Fact Sheet for Healthcare Providers: IncredibleEmployment.be  This test is not yet approved or cleared by the [place] FDA and has been authorized for detection and/or diagnosis of SARS-CoV-2 by FDA under an Emergency Use Authorization (EUA). This EUA will remain in effect (meaning this test can be used) for the duration of the COVID-19 declaration under Section 564(b)(1) of the Act, 21 U.S.C. section 360bbb-3(b)(1), unless the authorization is terminated or revoked.  Performed at [facility], [place] [address], [place], [place] [address]      Labs: Basic Metabolic Panel: Recent Labs  Lab 12/22/20 1402 12/24/20 0553 12/25/20 0414 12/26/20 0436  NA 134* 134* 134* 135  K 4.0 3.8 4.1 3.8  CL 103 102 102 105  CO2 20* 24 23 25   GLUCOSE 95 86 208* 100*  BUN 17 18 27* 19  CREATININE 0.57 0.43* 0.63 0.51  CALCIUM 9.4 9.2 9.3 9.2   Liver Function Tests: Recent Labs  Lab 12/22/20 1402  AST 20  ALT 14  ALKPHOS 59  BILITOT 0.7  PROT 6.1*  ALBUMIN 3.3*   No results for input(s): LIPASE,  AMYLASE in the last 168 hours. No results for input(s): AMMONIA in the last 168 hours. CBC: Recent Labs  Lab 12/22/20 1402 12/25/20 0414 12/26/20 0436  WBC 8.3 10.2 12.6*  HGB 11.3* 10.6* 9.8*  HCT 34.6* 31.8* 27.7*  MCV 89.4 87.4 87.9  PLT 395 297 298   Cardiac Enzymes: No results for input(s): CKTOTAL, CKMB, CKMBINDEX, TROPONINI in the last 168 hours. BNP: BNP (last 3 results) No results for input(s): BNP in the last 8760 hours.  ProBNP (last 3 results) No results for input(s): PROBNP in the last 8760 hours.  CBG: No results for input(s): GLUCAP in the last 168 hours.     Signed:  Desma Maxim MD.  [facility] 12/27/2020, 10:52 AM

## 2020-12-27 NOTE — ED Triage Notes (Signed)
[address] out of be about 5 inches per EMS.  Pt has no complaints.

## 2020-12-27 NOTE — Anesthesia Postprocedure Evaluation (Signed)
Anesthesia Post Note  Patient: Shatima Zalar  Procedure(s) Performed: TOTAL KNEE REVISION (Right: Knee)  Patient location during evaluation: PACU Anesthesia Type: General Level of consciousness: awake and alert Pain management: pain level controlled Vital Signs Assessment: post-procedure vital signs reviewed and stable Respiratory status: spontaneous breathing, nonlabored ventilation, respiratory function stable and patient connected to nasal cannula oxygen Cardiovascular status: blood pressure returned to baseline and stable Postop Assessment: no apparent nausea or vomiting Anesthetic complications: no Comments: Neuro at PreOp baseline   No notable events documented.   Last Vitals:  Vitals:   12/26/20 1950 12/27/20 0443  BP: (!) 109/54 (!) 160/68  Pulse: 63 74  Resp: 14 18  Temp: 36.8 C 36.6 C  SpO2: 95% 97%    Last Pain:  Vitals:   12/26/20 2130  TempSrc:   PainSc: Asleep                 Precious Haws Makylah Bossard

## 2020-12-27 NOTE — BH Assessment (Signed)
Clinician called into pt's room on the Tele-Assessment machine in an effort to complete pt's MH Assessment. Pt was saying, as the screen turned on, "I know my physician patient rights and if I say no to something you can't do it." Clinician introduced herself and stated she had a few questions to ask pt. Pt ignored clinician's questions, including, but not limited to, why she was in the hospital, how she is feeling, and if she moved to a new home earlier today. After receiving no response, clinician informed pt that she had a right to not speak to me but that it would be helpful if she could just verify she can hear me. Pt did not respond. TTS will attempt assessment at a later time.

## 2020-12-28 DIAGNOSIS — S40022A Contusion of left upper arm, initial encounter: Secondary | ICD-10-CM | POA: Diagnosis not present

## 2020-12-28 DIAGNOSIS — Z9189 Other specified personal risk factors, not elsewhere classified: Secondary | ICD-10-CM | POA: Diagnosis not present

## 2020-12-28 DIAGNOSIS — I6782 Cerebral ischemia: Secondary | ICD-10-CM | POA: Diagnosis not present

## 2020-12-28 DIAGNOSIS — Z8673 Personal history of transient ischemic attack (TIA), and cerebral infarction without residual deficits: Secondary | ICD-10-CM | POA: Diagnosis not present

## 2020-12-28 DIAGNOSIS — M25561 Pain in right knee: Secondary | ICD-10-CM | POA: Diagnosis not present

## 2020-12-28 DIAGNOSIS — M6281 Muscle weakness (generalized): Secondary | ICD-10-CM | POA: Diagnosis not present

## 2020-12-28 DIAGNOSIS — Z87891 Personal history of nicotine dependence: Secondary | ICD-10-CM | POA: Diagnosis not present

## 2020-12-28 DIAGNOSIS — S0990XA Unspecified injury of head, initial encounter: Secondary | ICD-10-CM | POA: Diagnosis not present

## 2020-12-28 DIAGNOSIS — Z760 Encounter for issue of repeat prescription: Secondary | ICD-10-CM | POA: Diagnosis not present

## 2020-12-28 DIAGNOSIS — R279 Unspecified lack of coordination: Secondary | ICD-10-CM | POA: Diagnosis not present

## 2020-12-28 DIAGNOSIS — R293 Abnormal posture: Secondary | ICD-10-CM | POA: Diagnosis not present

## 2020-12-28 DIAGNOSIS — F4323 Adjustment disorder with mixed anxiety and depressed mood: Secondary | ICD-10-CM | POA: Diagnosis not present

## 2020-12-28 DIAGNOSIS — S79921A Unspecified injury of right thigh, initial encounter: Secondary | ICD-10-CM | POA: Diagnosis not present

## 2020-12-28 DIAGNOSIS — Z7982 Long term (current) use of aspirin: Secondary | ICD-10-CM | POA: Diagnosis not present

## 2020-12-28 DIAGNOSIS — Z743 Need for continuous supervision: Secondary | ICD-10-CM | POA: Diagnosis not present

## 2020-12-28 DIAGNOSIS — I959 Hypotension, unspecified: Secondary | ICD-10-CM | POA: Diagnosis not present

## 2020-12-28 DIAGNOSIS — R0902 Hypoxemia: Secondary | ICD-10-CM | POA: Diagnosis not present

## 2020-12-28 DIAGNOSIS — R531 Weakness: Secondary | ICD-10-CM | POA: Diagnosis not present

## 2020-12-28 DIAGNOSIS — D649 Anemia, unspecified: Secondary | ICD-10-CM | POA: Diagnosis not present

## 2020-12-28 DIAGNOSIS — J9811 Atelectasis: Secondary | ICD-10-CM | POA: Diagnosis not present

## 2020-12-28 DIAGNOSIS — F39 Unspecified mood [affective] disorder: Secondary | ICD-10-CM | POA: Diagnosis not present

## 2020-12-28 DIAGNOSIS — R262 Difficulty in walking, not elsewhere classified: Secondary | ICD-10-CM | POA: Diagnosis not present

## 2020-12-28 DIAGNOSIS — M6259 Muscle wasting and atrophy, not elsewhere classified, multiple sites: Secondary | ICD-10-CM | POA: Diagnosis not present

## 2020-12-28 DIAGNOSIS — R2689 Other abnormalities of gait and mobility: Secondary | ICD-10-CM | POA: Diagnosis not present

## 2020-12-28 DIAGNOSIS — M160 Bilateral primary osteoarthritis of hip: Secondary | ICD-10-CM | POA: Diagnosis not present

## 2020-12-28 DIAGNOSIS — F251 Schizoaffective disorder, depressive type: Secondary | ICD-10-CM | POA: Diagnosis not present

## 2020-12-28 DIAGNOSIS — R0989 Other specified symptoms and signs involving the circulatory and respiratory systems: Secondary | ICD-10-CM | POA: Diagnosis not present

## 2020-12-28 DIAGNOSIS — Z79899 Other long term (current) drug therapy: Secondary | ICD-10-CM | POA: Diagnosis not present

## 2020-12-28 DIAGNOSIS — I213 ST elevation (STEMI) myocardial infarction of unspecified site: Secondary | ICD-10-CM | POA: Diagnosis not present

## 2020-12-28 DIAGNOSIS — M25512 Pain in left shoulder: Secondary | ICD-10-CM | POA: Diagnosis not present

## 2020-12-28 DIAGNOSIS — F32A Depression, unspecified: Secondary | ICD-10-CM | POA: Diagnosis not present

## 2020-12-28 DIAGNOSIS — F418 Other specified anxiety disorders: Secondary | ICD-10-CM | POA: Diagnosis not present

## 2020-12-28 DIAGNOSIS — I350 Nonrheumatic aortic (valve) stenosis: Secondary | ICD-10-CM | POA: Diagnosis not present

## 2020-12-28 DIAGNOSIS — N390 Urinary tract infection, site not specified: Secondary | ICD-10-CM | POA: Diagnosis not present

## 2020-12-28 DIAGNOSIS — S83104D Unspecified dislocation of right knee, subsequent encounter: Secondary | ICD-10-CM | POA: Diagnosis not present

## 2020-12-28 DIAGNOSIS — I10 Essential (primary) hypertension: Secondary | ICD-10-CM | POA: Diagnosis not present

## 2020-12-28 DIAGNOSIS — R4189 Other symptoms and signs involving cognitive functions and awareness: Secondary | ICD-10-CM | POA: Diagnosis not present

## 2020-12-28 DIAGNOSIS — Z043 Encounter for examination and observation following other accident: Secondary | ICD-10-CM | POA: Diagnosis not present

## 2020-12-28 DIAGNOSIS — M47812 Spondylosis without myelopathy or radiculopathy, cervical region: Secondary | ICD-10-CM | POA: Diagnosis not present

## 2020-12-28 DIAGNOSIS — L309 Dermatitis, unspecified: Secondary | ICD-10-CM | POA: Diagnosis not present

## 2020-12-28 DIAGNOSIS — R41841 Cognitive communication deficit: Secondary | ICD-10-CM | POA: Diagnosis not present

## 2020-12-28 DIAGNOSIS — E871 Hypo-osmolality and hyponatremia: Secondary | ICD-10-CM | POA: Diagnosis not present

## 2020-12-28 DIAGNOSIS — S4992XA Unspecified injury of left shoulder and upper arm, initial encounter: Secondary | ICD-10-CM | POA: Diagnosis present

## 2020-12-28 DIAGNOSIS — R296 Repeated falls: Secondary | ICD-10-CM | POA: Diagnosis not present

## 2020-12-28 DIAGNOSIS — R278 Other lack of coordination: Secondary | ICD-10-CM | POA: Diagnosis not present

## 2020-12-28 DIAGNOSIS — G9340 Encephalopathy, unspecified: Secondary | ICD-10-CM | POA: Diagnosis not present

## 2020-12-28 DIAGNOSIS — I6389 Other cerebral infarction: Secondary | ICD-10-CM | POA: Diagnosis not present

## 2020-12-28 DIAGNOSIS — Z20822 Contact with and (suspected) exposure to covid-19: Secondary | ICD-10-CM | POA: Diagnosis not present

## 2020-12-28 DIAGNOSIS — W01198A Fall on same level from slipping, tripping and stumbling with subsequent striking against other object, initial encounter: Secondary | ICD-10-CM | POA: Diagnosis not present

## 2020-12-28 DIAGNOSIS — N3000 Acute cystitis without hematuria: Secondary | ICD-10-CM | POA: Diagnosis not present

## 2020-12-28 DIAGNOSIS — W19XXXA Unspecified fall, initial encounter: Secondary | ICD-10-CM | POA: Diagnosis not present

## 2020-12-28 DIAGNOSIS — K4030 Unilateral inguinal hernia, with obstruction, without gangrene, not specified as recurrent: Secondary | ICD-10-CM | POA: Diagnosis not present

## 2020-12-28 DIAGNOSIS — G319 Degenerative disease of nervous system, unspecified: Secondary | ICD-10-CM | POA: Diagnosis not present

## 2020-12-28 DIAGNOSIS — F609 Personality disorder, unspecified: Secondary | ICD-10-CM | POA: Diagnosis not present

## 2020-12-28 DIAGNOSIS — R1312 Dysphagia, oropharyngeal phase: Secondary | ICD-10-CM | POA: Diagnosis not present

## 2020-12-28 DIAGNOSIS — M24461 Recurrent dislocation, right knee: Secondary | ICD-10-CM | POA: Diagnosis not present

## 2020-12-28 DIAGNOSIS — F603 Borderline personality disorder: Secondary | ICD-10-CM | POA: Diagnosis not present

## 2020-12-28 DIAGNOSIS — R031 Nonspecific low blood-pressure reading: Secondary | ICD-10-CM | POA: Diagnosis not present

## 2020-12-28 DIAGNOSIS — Z96659 Presence of unspecified artificial knee joint: Secondary | ICD-10-CM | POA: Diagnosis not present

## 2020-12-28 DIAGNOSIS — Z7401 Bed confinement status: Secondary | ICD-10-CM | POA: Diagnosis not present

## 2020-12-28 LAB — TYPE AND SCREEN
ABO/RH(D): O POS
Antibody Screen: POSITIVE
Unit division: 0
Unit division: 0

## 2020-12-28 LAB — BPAM RBC
Blood Product Expiration Date: 202211102359
Blood Product Expiration Date: 202211102359
Unit Type and Rh: 5100
Unit Type and Rh: 5100

## 2020-12-28 NOTE — BH Assessment (Signed)
[facility] Assessment Progress Note   Per Sheran Fava, NP, this voluntary pt does not require psychiatric hospitalization at this time.  Pt is psychiatrically cleared.  Discharge instructions advise pt to continue treatment with her regular unnamed outpatient provider.  [facility], DO and pt's nurse, Kandice Moos, have been notified.  Jalene Mullet, [facility] Triage Specialist [phone]

## 2020-12-28 NOTE — ED Notes (Signed)
Patient was fed her breakfast.

## 2020-12-28 NOTE — Discharge Instructions (Signed)
For your behavioral health needs you are advised to follow up with your regular outpatient provider.

## 2020-12-28 NOTE — ED Notes (Signed)
Second attempted to [facility] at 1225 but no one answer the phone I was on the phone for  5 min with no answer. Patient daughter Caryl Pina is aware and at bedside. PTAR call for transport.

## 2020-12-28 NOTE — ED Notes (Signed)
[facility]  call for patient transport to [facility].

## 2020-12-28 NOTE — ED Notes (Signed)
Attempted to [facility] at 1220 but no one answer the phone I was on the phone for 9 min with no answer.

## 2020-12-28 NOTE — ED Notes (Signed)
Patient pulled up in bed with the help of a 2 RNs

## 2020-12-28 NOTE — ED Notes (Signed)
Patient was given her lunch tray. I offered to feed her however the visitor at bedside said she would.

## 2020-12-28 NOTE — Consult Note (Signed)
[name] is a 80 year old female who presents after rolling out of bed.  Apparently patient was discharged yesterday afternoon from [facility], after knee operation.  It remains unclear as to why that TTS consult was placed for this patient, however she has not been forthcoming in or willing to participate in such evaluation.  Chart review shows patient states she has been depressed, due to decreased mobility and requiring assistance with ADLs.  Chart review shows patient was started on sertraline 25 mg, on previous admission noted above due to depressive symptoms.  At present patient does not appear to be a danger to herself or others.  Patient is endorsing depressive symptoms, that closely align with adjustment disorder and stress reaction as a result of her most recent injury.  Patient does not appear to benefit on warrant inpatient psychiatric admission, and for further psychiatric evaluation.  At this time will psychiatrically clear patient  To discharge back to nursing facility.  -Recommend increasing sertraline from 25 mg to 50 mg to further target symptoms of depression and adjustment disorder.  Of course overall goal is to increase rehabilitation and wellness, to promote independence and overall improvement in her mental health and physical wellbeing. -Patient will be psychiatrically cleared at this time.

## 2020-12-28 NOTE — ED Notes (Signed)
Rounded on pt. For safety. Pt. Assisted back into bed and given warm blanket.

## 2020-12-28 NOTE — ED Notes (Addendum)
Patients brief was changed and she was slid up in the bed.

## 2020-12-30 DIAGNOSIS — I10 Essential (primary) hypertension: Secondary | ICD-10-CM | POA: Diagnosis not present

## 2020-12-30 DIAGNOSIS — F39 Unspecified mood [affective] disorder: Secondary | ICD-10-CM | POA: Diagnosis not present

## 2020-12-30 DIAGNOSIS — L309 Dermatitis, unspecified: Secondary | ICD-10-CM | POA: Diagnosis not present

## 2020-12-30 DIAGNOSIS — M24461 Recurrent dislocation, right knee: Secondary | ICD-10-CM | POA: Diagnosis not present

## 2020-12-30 DIAGNOSIS — R262 Difficulty in walking, not elsewhere classified: Secondary | ICD-10-CM | POA: Diagnosis not present

## 2020-12-30 DIAGNOSIS — Z9189 Other specified personal risk factors, not elsewhere classified: Secondary | ICD-10-CM | POA: Diagnosis not present

## 2020-12-30 DIAGNOSIS — N3000 Acute cystitis without hematuria: Secondary | ICD-10-CM | POA: Diagnosis not present

## 2020-12-30 DIAGNOSIS — Z8673 Personal history of transient ischemic attack (TIA), and cerebral infarction without residual deficits: Secondary | ICD-10-CM | POA: Diagnosis not present

## 2020-12-31 ENCOUNTER — Encounter: Payer: Self-pay | Admitting: Orthopedic Surgery

## 2021-01-04 DIAGNOSIS — N3000 Acute cystitis without hematuria: Secondary | ICD-10-CM | POA: Diagnosis not present

## 2021-01-04 DIAGNOSIS — E871 Hypo-osmolality and hyponatremia: Secondary | ICD-10-CM | POA: Diagnosis not present

## 2021-01-04 DIAGNOSIS — W19XXXA Unspecified fall, initial encounter: Secondary | ICD-10-CM | POA: Diagnosis not present

## 2021-01-04 DIAGNOSIS — F251 Schizoaffective disorder, depressive type: Secondary | ICD-10-CM | POA: Diagnosis not present

## 2021-01-04 DIAGNOSIS — R0989 Other specified symptoms and signs involving the circulatory and respiratory systems: Secondary | ICD-10-CM | POA: Diagnosis not present

## 2021-01-04 DIAGNOSIS — F4323 Adjustment disorder with mixed anxiety and depressed mood: Secondary | ICD-10-CM | POA: Diagnosis not present

## 2021-01-04 DIAGNOSIS — Z760 Encounter for issue of repeat prescription: Secondary | ICD-10-CM | POA: Diagnosis not present

## 2021-01-04 DIAGNOSIS — F603 Borderline personality disorder: Secondary | ICD-10-CM | POA: Diagnosis not present

## 2021-01-08 ENCOUNTER — Emergency Department (HOSPITAL_COMMUNITY)
Admission: EM | Admit: 2021-01-08 | Discharge: 2021-01-09 | Disposition: A | Discharge status: Home / Self Care | Payer: Medicare PPO | Attending: Emergency Medicine | Admitting: Emergency Medicine

## 2021-01-08 ENCOUNTER — Emergency Department (HOSPITAL_COMMUNITY): Payer: Medicare PPO

## 2021-01-08 ENCOUNTER — Other Ambulatory Visit: Payer: Self-pay

## 2021-01-08 ENCOUNTER — Encounter (HOSPITAL_COMMUNITY): Payer: Self-pay | Admitting: *Deleted

## 2021-01-08 DIAGNOSIS — W01198A Fall on same level from slipping, tripping and stumbling with subsequent striking against other object, initial encounter: Secondary | ICD-10-CM | POA: Diagnosis not present

## 2021-01-08 DIAGNOSIS — I10 Essential (primary) hypertension: Secondary | ICD-10-CM | POA: Diagnosis not present

## 2021-01-08 DIAGNOSIS — R296 Repeated falls: Secondary | ICD-10-CM | POA: Insufficient documentation

## 2021-01-08 DIAGNOSIS — G319 Degenerative disease of nervous system, unspecified: Secondary | ICD-10-CM | POA: Diagnosis not present

## 2021-01-08 DIAGNOSIS — Z79899 Other long term (current) drug therapy: Secondary | ICD-10-CM | POA: Insufficient documentation

## 2021-01-08 DIAGNOSIS — M47812 Spondylosis without myelopathy or radiculopathy, cervical region: Secondary | ICD-10-CM | POA: Diagnosis not present

## 2021-01-08 DIAGNOSIS — M160 Bilateral primary osteoarthritis of hip: Secondary | ICD-10-CM | POA: Diagnosis not present

## 2021-01-08 DIAGNOSIS — I6782 Cerebral ischemia: Secondary | ICD-10-CM | POA: Diagnosis not present

## 2021-01-08 DIAGNOSIS — R531 Weakness: Secondary | ICD-10-CM | POA: Insufficient documentation

## 2021-01-08 DIAGNOSIS — M25561 Pain in right knee: Secondary | ICD-10-CM | POA: Diagnosis not present

## 2021-01-08 DIAGNOSIS — Z7982 Long term (current) use of aspirin: Secondary | ICD-10-CM | POA: Diagnosis not present

## 2021-01-08 DIAGNOSIS — S4992XA Unspecified injury of left shoulder and upper arm, initial encounter: Secondary | ICD-10-CM | POA: Diagnosis present

## 2021-01-08 DIAGNOSIS — Z043 Encounter for examination and observation following other accident: Secondary | ICD-10-CM | POA: Diagnosis not present

## 2021-01-08 DIAGNOSIS — W19XXXA Unspecified fall, initial encounter: Secondary | ICD-10-CM

## 2021-01-08 DIAGNOSIS — S40022A Contusion of left upper arm, initial encounter: Secondary | ICD-10-CM | POA: Insufficient documentation

## 2021-01-08 DIAGNOSIS — S79921A Unspecified injury of right thigh, initial encounter: Secondary | ICD-10-CM | POA: Diagnosis not present

## 2021-01-08 DIAGNOSIS — J9811 Atelectasis: Secondary | ICD-10-CM | POA: Diagnosis not present

## 2021-01-08 HISTORY — DX: Cerebral infarction, unspecified: I63.9

## 2021-01-08 HISTORY — DX: Essential (primary) hypertension: I10

## 2021-01-08 MED ORDER — FENTANYL CITRATE PF 50 MCG/ML IJ SOSY: 50.0000 ug | PREFILLED_SYRINGE | Freq: Once | INTRAMUSCULAR | Status: DC

## 2021-01-08 NOTE — ED Provider Notes (Signed)
[facility] EMERGENCY DEPARTMENT Provider Note   CSN: [id number] Arrival date & time: 01/08/21  2312     History Chief Complaint  Patient presents with   [name] is a 80 y.o. female.   Fall This is a recurrent (4 days) problem. The problem occurs constantly. The problem has not changed since onset.Pertinent negatives include no chest pain, no abdominal pain, no headaches and no shortness of breath. Nothing aggravates the symptoms. Nothing relieves the symptoms. She has tried nothing for the symptoms.      Past Medical History:  Diagnosis Date   Hypertension    Stroke [facility])     There are no problems to display for this patient.  OB History   No obstetric history on file.     No family history on file.     Home Medications Prior to Admission medications   Medication Sig Start Date End Date Taking? Authorizing Provider  acetaminophen (TYLENOL) 325 MG tablet Take 650 mg by mouth in the morning and at bedtime.   Yes [provider]  amLODipine (NORVASC) 10 MG tablet Take 10 mg by mouth at bedtime.   Yes [provider]  ARIPiprazole (ABILIFY) 5 MG tablet Take 5 mg by mouth at bedtime.   Yes [provider]  aspirin EC 81 MG tablet Take 81 mg by mouth daily. Swallow whole.   Yes [provider]  carvedilol (COREG) 6.25 MG tablet Take 6.25 mg by mouth 2 (two) times daily with a meal.   Yes [provider]  gabapentin (NEURONTIN) 100 MG capsule Take 100 mg by mouth at bedtime.   Yes [provider]  HYDROcodone-acetaminophen (NORCO/VICODIN) 5-325 MG tablet Take 1 tablet by mouth every 12 (twelve) hours as needed for severe pain.   Yes [provider]  Multiple Vitamins-Minerals (DECUBI-VITE PO) Take 1 capsule by mouth daily.   Yes [provider]  nystatin powder Apply 1 application topically in the morning and at bedtime.   Yes [provider]  Pollen  Extracts (PROSTAT PO) Take 30 mLs by mouth in the morning and at bedtime.   Yes [provider]  polyethylene glycol powder (GLYCOLAX/MIRALAX) 17 GM/SCOOP powder Take 17 g by mouth daily.   Yes [provider]  rosuvastatin (CRESTOR) 10 MG tablet Take 10 mg by mouth at bedtime.   Yes [provider]  sennosides-docusate sodium (SENOKOT-S) 8.6-50 MG tablet Take 1 tablet by mouth in the morning and at bedtime.   Yes [provider]  sertraline (ZOLOFT) 25 MG tablet Take 25 mg by mouth daily.   Yes [provider]  tiZANidine (ZANAFLEX) 4 MG tablet Take 4 mg by mouth every 8 (eight) hours as needed for muscle spasms.   Yes [provider]  traMADol (ULTRAM) 50 MG tablet Take 50 mg by mouth every 6 (six) hours as needed for moderate pain.   Yes [provider]  triamcinolone (KENALOG) 0.147 MG/GM topical spray Apply 1 spray topically 2 (two) times daily.   Yes [provider]  enoxaparin (LOVENOX) 40 MG/0.4ML injection Inject 40 mg into the skin See admin instructions. Qd x 5 days Patient not taking: Reported on 01/09/2021    [provider]  enoxaparin (LOVENOX) 40 MG/0.4ML injection Inject 40 mg into the skin See admin instructions. Qd x 6 days Patient not taking: Reported on 01/09/2021    [provider]  lisinopril (ZESTRIL) 40 MG tablet Take 40  mg by mouth daily. Patient not taking: Reported on 01/09/2021    [provider]    Allergies    Penicillins  Review of Systems   Review of Systems  Respiratory:  Negative for shortness of breath.   Cardiovascular:  Negative for chest pain.  Gastrointestinal:  Negative for abdominal pain.  Neurological:  Negative for headaches.  All other systems reviewed and are negative.  Physical Exam Updated Vital Signs BP (!) 142/60   Pulse 72   Temp (!) 97.4 F (36.3 C) (Temporal)   Resp 16   Ht 5\' 3"  (1.6 m)   Wt 78 kg   SpO2 100%   BMI 30.47 kg/m    Physical Exam Vitals and nursing note reviewed.  Constitutional:      Appearance: She is well-developed.  HENT:     Head: Normocephalic and atraumatic.     Nose: No congestion or rhinorrhea.     Mouth/Throat:     Mouth: Mucous membranes are moist.     Pharynx: Oropharynx is clear.  Eyes:     Pupils: Pupils are equal, round, and reactive to light.  Cardiovascular:     Rate and Rhythm: Normal rate and regular rhythm.  Pulmonary:     Effort: No respiratory distress.     Breath sounds: No stridor.  Abdominal:     General: Abdomen is flat. There is no distension.  Musculoskeletal:        General: No swelling or tenderness. Normal range of motion.     Cervical back: Normal range of motion.  Skin:    General: Skin is warm and dry.     Findings: Bruising (old, left upper arm) present.     Comments: Multiple staples to right knee c/d/I without e/o infection  Neurological:     General: No focal deficit present.     Mental Status: She is alert.    ED Results / Procedures / Treatments   Labs (all labs ordered are listed, but only abnormal results are displayed) Labs Reviewed  CBC WITH DIFFERENTIAL/PLATELET - Abnormal; Notable for the following components:      Result Value   RBC 3.29 (*)    Hemoglobin 9.8 (*)    HCT 29.9 (*)    All other components within normal limits  COMPREHENSIVE METABOLIC PANEL - Abnormal; Notable for the following components:   CO2 21 (*)    Glucose, Bld 119 (*)    Total Protein 5.9 (*)    Albumin 3.4 (*)    All other components within normal limits    EKG None  Radiology DG Tibia/Fibula Right  Result Date: 01/09/2021 CLINICAL DATA:  Multiple falls, right leg injury EXAM: RIGHT TIBIA AND FIBULA - 2 VIEW COMPARISON:  None. FINDINGS: Right total knee arthroplasty is partially visualized. No acute fracture or dislocation. Soft tissues are unremarkable. IMPRESSION: No acute abnormality. Electronically Signed   By: Fidela Salisbury M.D.   On: 01/09/2021  00:07   CT HEAD WO CONTRAST (5MM)  Result Date: 01/08/2021 CLINICAL DATA:  Multiple falls, found down, on blood thinners EXAM: CT HEAD WITHOUT CONTRAST CT CERVICAL SPINE WITHOUT CONTRAST TECHNIQUE: Multidetector CT imaging of the head and cervical spine was performed following the standard protocol without intravenous contrast. Multiplanar CT image reconstructions of the cervical spine were also generated. COMPARISON:  12/22/2020 FINDINGS: CT HEAD FINDINGS Brain: No evidence of acute infarction, hemorrhage, hydrocephalus, extra-axial collection or mass lesion/mass effect. Global cortical and central atrophy. Subcortical white matter and periventricular small  vessel ischemic changes. Vascular: Intracranial atherosclerosis. Skull: Normal. Negative for fracture or focal lesion. Sinuses/Orbits: The visualized paranasal sinuses are essentially clear. The mastoid air cells are unopacified. Other: None. CT CERVICAL SPINE FINDINGS Alignment: Normal cervical lordosis. Skull base and vertebrae: No acute fracture. No primary bone lesion or focal pathologic process. Soft tissues and spinal canal: No prevertebral fluid or swelling. No visible canal hematoma. Disc levels: Degenerative changes, most prominent at C1-2. Spinal canal is patent. Upper chest: Visualized lung apices are clear. Other: 14 mm rim calcified lesion in the right posterior thyroid (series 4/image 31), benign. Not clinically significant; no follow-up imaging recommended (ref: J Am Coll Radiol. 2015 Feb;12(2): 143-50). IMPRESSION: No evidence of acute intracranial abnormality. Atrophy with small vessel ischemic changes. No evidence of traumatic injury to the cervical spine. Multilevel degenerative changes. Electronically Signed   By: Julian Hy M.D.   On: 01/08/2021 23:55   CT Cervical Spine Wo Contrast  Result Date: 01/08/2021 CLINICAL DATA:  Multiple falls, found down, on blood thinners EXAM: CT HEAD WITHOUT CONTRAST CT CERVICAL SPINE WITHOUT  CONTRAST TECHNIQUE: Multidetector CT imaging of the head and cervical spine was performed following the standard protocol without intravenous contrast. Multiplanar CT image reconstructions of the cervical spine were also generated. COMPARISON:  12/22/2020 FINDINGS: CT HEAD FINDINGS Brain: No evidence of acute infarction, hemorrhage, hydrocephalus, extra-axial collection or mass lesion/mass effect. Global cortical and central atrophy. Subcortical white matter and periventricular small vessel ischemic changes. Vascular: Intracranial atherosclerosis. Skull: Normal. Negative for fracture or focal lesion. Sinuses/Orbits: The visualized paranasal sinuses are essentially clear. The mastoid air cells are unopacified. Other: None. CT CERVICAL SPINE FINDINGS Alignment: Normal cervical lordosis. Skull base and vertebrae: No acute fracture. No primary bone lesion or focal pathologic process. Soft tissues and spinal canal: No prevertebral fluid or swelling. No visible canal hematoma. Disc levels: Degenerative changes, most prominent at C1-2. Spinal canal is patent. Upper chest: Visualized lung apices are clear. Other: 14 mm rim calcified lesion in the right posterior thyroid (series 4/image 31), benign. Not clinically significant; no follow-up imaging recommended (ref: J Am Coll Radiol. 2015 Feb;12(2): 143-50). IMPRESSION: No evidence of acute intracranial abnormality. Atrophy with small vessel ischemic changes. No evidence of traumatic injury to the cervical spine. Multilevel degenerative changes. Electronically Signed   By: Julian Hy M.D.   On: 01/08/2021 23:55   DG Pelvis Portable  Result Date: 01/08/2021 CLINICAL DATA:  Status post fall. EXAM: PORTABLE PELVIS 1-2 VIEWS COMPARISON:  December 22, 2020 FINDINGS: There is no evidence of pelvic fracture or diastasis. No pelvic bone lesions are seen. Degenerative changes seen involving both hips in the form of joint space narrowing and acetabular sclerosis. IMPRESSION:  No acute osseous abnormality. Electronically Signed   By: Virgina Norfolk M.D.   On: 01/08/2021 23:42   DG Chest Portable 1 View  Result Date: 01/08/2021 CLINICAL DATA:  Status post fall. EXAM: PORTABLE CHEST 1 VIEW COMPARISON:  December 04, 2020 FINDINGS: Low lung volumes are noted. Very mild atelectasis is seen within the right lung base. There is no evidence of acute infiltrate, pleural effusion or pneumothorax. The heart size and mediastinal contours are within normal limits. Marked severity calcification of the thoracic aorta is noted. The visualized skeletal structures are unremarkable. IMPRESSION: No active disease. Electronically Signed   By: Virgina Norfolk M.D.   On: 01/08/2021 23:43   DG Femur Min 2 Views Right  Result Date: 01/09/2021 CLINICAL DATA:  Fall, right leg injury EXAM: RIGHT FEMUR  2 VIEWS COMPARISON:  None. FINDINGS: Status post right total knee arthroplasty. Surgical skin staple seen anterior to the right knee. Normal alignment. No acute fracture or dislocation. Right hip joint space preserved. Soft tissues are unremarkable. IMPRESSION: No acute abnormality. Electronically Signed   By: Fidela Salisbury M.D.   On: 01/09/2021 00:06    Procedures Procedures   Medications Ordered in ED Medications  fentaNYL (SUBLIMAZE) injection 50 mcg (0 mcg Intravenous Hold 01/08/21 2356)    ED Course  I have reviewed the triage vital signs and the nursing notes.  Pertinent labs & imaging results that were available during my care of the patient were reviewed by me and considered in my medical decision making (see chart for details).    MDM Rules/Calculators/A&P                         Fall without obvious traumatic injuries. No change in right knee arthroplasty. No dehiscence of wound. Suspect falls because of knee and generalized weakness. Will defer to PCP for further workup/management.   Final Clinical Impression(s) / ED Diagnoses Final diagnoses:  Fall, initial encounter     Rx / DC Orders ED Discharge Orders     None        Raden Byington, Corene Cornea, MD 01/09/21 [phone]

## 2021-01-08 NOTE — ED Notes (Signed)
Trauma Response Nurse Note-  Reason for Call / Reason for Trauma activation:   - Level 2 trauma, fall on blood thinners (lovenox)  Initial Focused Assessment (If applicable, or please see trauma documentation):  - Pt came in, at baseline orientation as per EMS. Pt had c-collar on   Interventions:  - Blood work obtained. IV placed. X-rays and CT obtained. Pt in CT at 23:32  Plan of Care as of this note:  - Waiting on results of images  Event Summary:   - Pt came in as a level 2 trauma, fall. EMS reported pt fell 4 times today and at least hit her head twice. EMS states they are not aware of why the pt was not seen earlier. Pt presented with a brace on the right leg. EDP removed brace and noted staples over the right knee. Brace reapplied. X-rays obtained and taken to CT and then x-rays. Pt laying in bed on cardiac, bp and pulse ox monitor.

## 2021-01-08 NOTE — ED Triage Notes (Signed)
Pt arrived from [facility] for multiple falls today, reported by staff. Per EMS, staff reported falls x4 today and potentionally hit her head twice. Pt had recent surgery to R knee with staples in place, bruising noted to L shoulder that is yellow in color. Is receiving  lovenox. C-collar in place

## 2021-01-09 LAB — CBC WITH DIFFERENTIAL/PLATELET
Abs Immature Granulocytes: 0.05 10*3/uL (ref 0.00–0.07)
Basophils Absolute: 0 10*3/uL (ref 0.0–0.1)
Basophils Relative: 0 %
Eosinophils Absolute: 0.2 10*3/uL (ref 0.0–0.5)
Eosinophils Relative: 2 %
HCT: 29.9 % — ABNORMAL LOW (ref 36.0–46.0)
Hemoglobin: 9.8 g/dL — ABNORMAL LOW (ref 12.0–15.0)
Immature Granulocytes: 1 %
Lymphocytes Relative: 23 %
Lymphs Abs: 2 10*3/uL (ref 0.7–4.0)
MCH: 29.8 pg (ref 26.0–34.0)
MCHC: 32.8 g/dL (ref 30.0–36.0)
MCV: 90.9 fL (ref 80.0–100.0)
Monocytes Absolute: 0.8 10*3/uL (ref 0.1–1.0)
Monocytes Relative: 9 %
Neutro Abs: 5.8 10*3/uL (ref 1.7–7.7)
Neutrophils Relative %: 65 %
Platelets: 322 10*3/uL (ref 150–400)
RBC: 3.29 MIL/uL — ABNORMAL LOW (ref 3.87–5.11)
RDW: 14.8 % (ref 11.5–15.5)
WBC: 8.9 10*3/uL (ref 4.0–10.5)
nRBC: 0 % (ref 0.0–0.2)

## 2021-01-09 LAB — COMPREHENSIVE METABOLIC PANEL
ALT: 13 U/L (ref 0–44)
AST: 19 U/L (ref 15–41)
Albumin: 3.4 g/dL — ABNORMAL LOW (ref 3.5–5.0)
Alkaline Phosphatase: 58 U/L (ref 38–126)
Anion gap: 10 (ref 5–15)
BUN: 21 mg/dL (ref 8–23)
CO2: 21 mmol/L — ABNORMAL LOW (ref 22–32)
Calcium: 9.5 mg/dL (ref 8.9–10.3)
Chloride: 104 mmol/L (ref 98–111)
Creatinine, Ser: 0.59 mg/dL (ref 0.44–1.00)
GFR, Estimated: >60 mL/min (ref >60)
Glucose, Bld: 119 mg/dL — ABNORMAL HIGH (ref 70–99)
Potassium: 4 mmol/L (ref 3.5–5.1)
Sodium: 135 mmol/L (ref 135–145)
Total Bilirubin: 0.5 mg/dL (ref 0.3–1.2)
Total Protein: 5.9 g/dL — ABNORMAL LOW (ref 6.5–8.1)

## 2021-01-09 NOTE — ED Notes (Signed)
[facility] called  

## 2021-01-10 ENCOUNTER — Encounter: Payer: Self-pay | Admitting: Oncology

## 2021-01-10 ENCOUNTER — Encounter: Payer: Self-pay | Admitting: Orthopedic Surgery

## 2021-01-10 DIAGNOSIS — F418 Other specified anxiety disorders: Secondary | ICD-10-CM | POA: Diagnosis not present

## 2021-01-10 DIAGNOSIS — F4323 Adjustment disorder with mixed anxiety and depressed mood: Secondary | ICD-10-CM | POA: Diagnosis not present

## 2021-01-10 DIAGNOSIS — F251 Schizoaffective disorder, depressive type: Secondary | ICD-10-CM | POA: Diagnosis not present

## 2021-01-10 DIAGNOSIS — R4189 Other symptoms and signs involving cognitive functions and awareness: Secondary | ICD-10-CM | POA: Diagnosis not present

## 2021-01-10 DIAGNOSIS — F603 Borderline personality disorder: Secondary | ICD-10-CM | POA: Diagnosis not present

## 2021-01-11 ENCOUNTER — Telehealth: Payer: Self-pay | Admitting: Oncology

## 2021-01-11 NOTE — Telephone Encounter (Signed)
Caregiver called to cancel pt appt for 11-3. Does not want to reschedule. Phone # to home is [phone]

## 2021-01-13 ENCOUNTER — Inpatient Hospital Stay: Payer: Medicare PPO | Attending: Oncology

## 2021-01-13 ENCOUNTER — Inpatient Hospital Stay: Payer: Medicare PPO | Admitting: Oncology

## 2021-01-13 ENCOUNTER — Inpatient Hospital Stay: Payer: Medicare PPO

## 2021-01-13 DIAGNOSIS — Z96659 Presence of unspecified artificial knee joint: Secondary | ICD-10-CM | POA: Diagnosis not present

## 2021-01-13 DIAGNOSIS — R031 Nonspecific low blood-pressure reading: Secondary | ICD-10-CM | POA: Diagnosis not present

## 2021-01-13 DIAGNOSIS — R296 Repeated falls: Secondary | ICD-10-CM | POA: Diagnosis not present

## 2021-01-13 DIAGNOSIS — D649 Anemia, unspecified: Secondary | ICD-10-CM | POA: Diagnosis not present

## 2021-01-17 DIAGNOSIS — R600 Localized edema: Secondary | ICD-10-CM | POA: Diagnosis not present

## 2021-01-17 DIAGNOSIS — R296 Repeated falls: Secondary | ICD-10-CM | POA: Diagnosis not present

## 2021-01-17 DIAGNOSIS — R031 Nonspecific low blood-pressure reading: Secondary | ICD-10-CM | POA: Diagnosis not present

## 2021-01-19 ENCOUNTER — Encounter: Payer: Self-pay | Admitting: Oncology

## 2021-01-19 DIAGNOSIS — R279 Unspecified lack of coordination: Secondary | ICD-10-CM | POA: Diagnosis not present

## 2021-01-19 DIAGNOSIS — R293 Abnormal posture: Secondary | ICD-10-CM | POA: Diagnosis not present

## 2021-01-19 DIAGNOSIS — R2689 Other abnormalities of gait and mobility: Secondary | ICD-10-CM | POA: Diagnosis not present

## 2021-01-19 DIAGNOSIS — S83106D Unspecified dislocation of unspecified knee, subsequent encounter: Secondary | ICD-10-CM | POA: Diagnosis not present

## 2021-01-19 DIAGNOSIS — M6259 Muscle wasting and atrophy, not elsewhere classified, multiple sites: Secondary | ICD-10-CM | POA: Diagnosis not present

## 2021-01-19 DIAGNOSIS — I6389 Other cerebral infarction: Secondary | ICD-10-CM | POA: Diagnosis not present

## 2021-01-19 DIAGNOSIS — M6281 Muscle weakness (generalized): Secondary | ICD-10-CM | POA: Diagnosis not present

## 2021-01-19 DIAGNOSIS — G8194 Hemiplegia, unspecified affecting left nondominant side: Secondary | ICD-10-CM | POA: Diagnosis not present

## 2021-01-19 DIAGNOSIS — S83104D Unspecified dislocation of right knee, subsequent encounter: Secondary | ICD-10-CM | POA: Diagnosis not present

## 2021-01-20 ENCOUNTER — Other Ambulatory Visit (HOSPITAL_COMMUNITY): Payer: Self-pay | Admitting: Orthopedic Surgery

## 2021-01-20 DIAGNOSIS — S83104A Unspecified dislocation of right knee, initial encounter: Secondary | ICD-10-CM

## 2021-01-20 DIAGNOSIS — Z8673 Personal history of transient ischemic attack (TIA), and cerebral infarction without residual deficits: Secondary | ICD-10-CM | POA: Diagnosis not present

## 2021-01-20 DIAGNOSIS — D649 Anemia, unspecified: Secondary | ICD-10-CM | POA: Diagnosis not present

## 2021-01-20 DIAGNOSIS — I10 Essential (primary) hypertension: Secondary | ICD-10-CM | POA: Diagnosis not present

## 2021-01-20 DIAGNOSIS — E785 Hyperlipidemia, unspecified: Secondary | ICD-10-CM | POA: Diagnosis not present

## 2021-01-20 DIAGNOSIS — Z96659 Presence of unspecified artificial knee joint: Secondary | ICD-10-CM | POA: Diagnosis not present

## 2021-01-22 DIAGNOSIS — M6281 Muscle weakness (generalized): Secondary | ICD-10-CM | POA: Diagnosis not present

## 2021-01-22 DIAGNOSIS — S83106D Unspecified dislocation of unspecified knee, subsequent encounter: Secondary | ICD-10-CM | POA: Diagnosis not present

## 2021-01-22 DIAGNOSIS — R2689 Other abnormalities of gait and mobility: Secondary | ICD-10-CM | POA: Diagnosis not present

## 2021-01-22 DIAGNOSIS — G8194 Hemiplegia, unspecified affecting left nondominant side: Secondary | ICD-10-CM | POA: Diagnosis not present

## 2021-01-22 DIAGNOSIS — S83104D Unspecified dislocation of right knee, subsequent encounter: Secondary | ICD-10-CM | POA: Diagnosis not present

## 2021-01-22 DIAGNOSIS — M6259 Muscle wasting and atrophy, not elsewhere classified, multiple sites: Secondary | ICD-10-CM | POA: Diagnosis not present

## 2021-01-22 DIAGNOSIS — R293 Abnormal posture: Secondary | ICD-10-CM | POA: Diagnosis not present

## 2021-01-22 DIAGNOSIS — I6389 Other cerebral infarction: Secondary | ICD-10-CM | POA: Diagnosis not present

## 2021-01-22 DIAGNOSIS — R279 Unspecified lack of coordination: Secondary | ICD-10-CM | POA: Diagnosis not present

## 2021-01-24 DIAGNOSIS — M6281 Muscle weakness (generalized): Secondary | ICD-10-CM | POA: Diagnosis not present

## 2021-01-24 DIAGNOSIS — F603 Borderline personality disorder: Secondary | ICD-10-CM | POA: Diagnosis not present

## 2021-01-24 DIAGNOSIS — R293 Abnormal posture: Secondary | ICD-10-CM | POA: Diagnosis not present

## 2021-01-24 DIAGNOSIS — F251 Schizoaffective disorder, depressive type: Secondary | ICD-10-CM | POA: Diagnosis not present

## 2021-01-24 DIAGNOSIS — G8194 Hemiplegia, unspecified affecting left nondominant side: Secondary | ICD-10-CM | POA: Diagnosis not present

## 2021-01-24 DIAGNOSIS — S83106D Unspecified dislocation of unspecified knee, subsequent encounter: Secondary | ICD-10-CM | POA: Diagnosis not present

## 2021-01-24 DIAGNOSIS — S83104D Unspecified dislocation of right knee, subsequent encounter: Secondary | ICD-10-CM | POA: Diagnosis not present

## 2021-01-24 DIAGNOSIS — I6389 Other cerebral infarction: Secondary | ICD-10-CM | POA: Diagnosis not present

## 2021-01-24 DIAGNOSIS — R2689 Other abnormalities of gait and mobility: Secondary | ICD-10-CM | POA: Diagnosis not present

## 2021-01-24 DIAGNOSIS — R279 Unspecified lack of coordination: Secondary | ICD-10-CM | POA: Diagnosis not present

## 2021-01-24 DIAGNOSIS — F4323 Adjustment disorder with mixed anxiety and depressed mood: Secondary | ICD-10-CM | POA: Diagnosis not present

## 2021-01-24 DIAGNOSIS — M6259 Muscle wasting and atrophy, not elsewhere classified, multiple sites: Secondary | ICD-10-CM | POA: Diagnosis not present

## 2021-01-25 DIAGNOSIS — I6389 Other cerebral infarction: Secondary | ICD-10-CM | POA: Diagnosis not present

## 2021-01-25 DIAGNOSIS — R2689 Other abnormalities of gait and mobility: Secondary | ICD-10-CM | POA: Diagnosis not present

## 2021-01-25 DIAGNOSIS — M6259 Muscle wasting and atrophy, not elsewhere classified, multiple sites: Secondary | ICD-10-CM | POA: Diagnosis not present

## 2021-01-25 DIAGNOSIS — S83104D Unspecified dislocation of right knee, subsequent encounter: Secondary | ICD-10-CM | POA: Diagnosis not present

## 2021-01-25 DIAGNOSIS — R279 Unspecified lack of coordination: Secondary | ICD-10-CM | POA: Diagnosis not present

## 2021-01-25 DIAGNOSIS — G8194 Hemiplegia, unspecified affecting left nondominant side: Secondary | ICD-10-CM | POA: Diagnosis not present

## 2021-01-25 DIAGNOSIS — M6281 Muscle weakness (generalized): Secondary | ICD-10-CM | POA: Diagnosis not present

## 2021-01-25 DIAGNOSIS — S83106D Unspecified dislocation of unspecified knee, subsequent encounter: Secondary | ICD-10-CM | POA: Diagnosis not present

## 2021-01-25 DIAGNOSIS — R293 Abnormal posture: Secondary | ICD-10-CM | POA: Diagnosis not present

## 2021-01-26 DIAGNOSIS — F4323 Adjustment disorder with mixed anxiety and depressed mood: Secondary | ICD-10-CM | POA: Diagnosis not present

## 2021-01-26 DIAGNOSIS — M6259 Muscle wasting and atrophy, not elsewhere classified, multiple sites: Secondary | ICD-10-CM | POA: Diagnosis not present

## 2021-01-26 DIAGNOSIS — R293 Abnormal posture: Secondary | ICD-10-CM | POA: Diagnosis not present

## 2021-01-26 DIAGNOSIS — I6389 Other cerebral infarction: Secondary | ICD-10-CM | POA: Diagnosis not present

## 2021-01-26 DIAGNOSIS — G8194 Hemiplegia, unspecified affecting left nondominant side: Secondary | ICD-10-CM | POA: Diagnosis not present

## 2021-01-26 DIAGNOSIS — S83104D Unspecified dislocation of right knee, subsequent encounter: Secondary | ICD-10-CM | POA: Diagnosis not present

## 2021-01-26 DIAGNOSIS — S83106D Unspecified dislocation of unspecified knee, subsequent encounter: Secondary | ICD-10-CM | POA: Diagnosis not present

## 2021-01-26 DIAGNOSIS — F251 Schizoaffective disorder, depressive type: Secondary | ICD-10-CM | POA: Diagnosis not present

## 2021-01-26 DIAGNOSIS — R2689 Other abnormalities of gait and mobility: Secondary | ICD-10-CM | POA: Diagnosis not present

## 2021-01-26 DIAGNOSIS — M6281 Muscle weakness (generalized): Secondary | ICD-10-CM | POA: Diagnosis not present

## 2021-01-26 DIAGNOSIS — R279 Unspecified lack of coordination: Secondary | ICD-10-CM | POA: Diagnosis not present

## 2021-01-27 DIAGNOSIS — S83104D Unspecified dislocation of right knee, subsequent encounter: Secondary | ICD-10-CM | POA: Diagnosis not present

## 2021-01-27 DIAGNOSIS — M6281 Muscle weakness (generalized): Secondary | ICD-10-CM | POA: Diagnosis not present

## 2021-01-27 DIAGNOSIS — R279 Unspecified lack of coordination: Secondary | ICD-10-CM | POA: Diagnosis not present

## 2021-01-27 DIAGNOSIS — S83106D Unspecified dislocation of unspecified knee, subsequent encounter: Secondary | ICD-10-CM | POA: Diagnosis not present

## 2021-01-27 DIAGNOSIS — M6259 Muscle wasting and atrophy, not elsewhere classified, multiple sites: Secondary | ICD-10-CM | POA: Diagnosis not present

## 2021-01-27 DIAGNOSIS — G8194 Hemiplegia, unspecified affecting left nondominant side: Secondary | ICD-10-CM | POA: Diagnosis not present

## 2021-01-27 DIAGNOSIS — R2689 Other abnormalities of gait and mobility: Secondary | ICD-10-CM | POA: Diagnosis not present

## 2021-01-27 DIAGNOSIS — R293 Abnormal posture: Secondary | ICD-10-CM | POA: Diagnosis not present

## 2021-01-27 DIAGNOSIS — I6389 Other cerebral infarction: Secondary | ICD-10-CM | POA: Diagnosis not present

## 2021-01-28 DIAGNOSIS — R2689 Other abnormalities of gait and mobility: Secondary | ICD-10-CM | POA: Diagnosis not present

## 2021-01-28 DIAGNOSIS — R293 Abnormal posture: Secondary | ICD-10-CM | POA: Diagnosis not present

## 2021-01-28 DIAGNOSIS — M6281 Muscle weakness (generalized): Secondary | ICD-10-CM | POA: Diagnosis not present

## 2021-01-28 DIAGNOSIS — S83104D Unspecified dislocation of right knee, subsequent encounter: Secondary | ICD-10-CM | POA: Diagnosis not present

## 2021-01-28 DIAGNOSIS — I6389 Other cerebral infarction: Secondary | ICD-10-CM | POA: Diagnosis not present

## 2021-01-28 DIAGNOSIS — G629 Polyneuropathy, unspecified: Secondary | ICD-10-CM | POA: Diagnosis not present

## 2021-01-28 DIAGNOSIS — S83106D Unspecified dislocation of unspecified knee, subsequent encounter: Secondary | ICD-10-CM | POA: Diagnosis not present

## 2021-01-28 DIAGNOSIS — R600 Localized edema: Secondary | ICD-10-CM | POA: Diagnosis not present

## 2021-01-28 DIAGNOSIS — R279 Unspecified lack of coordination: Secondary | ICD-10-CM | POA: Diagnosis not present

## 2021-01-28 DIAGNOSIS — G8194 Hemiplegia, unspecified affecting left nondominant side: Secondary | ICD-10-CM | POA: Diagnosis not present

## 2021-01-28 DIAGNOSIS — M6259 Muscle wasting and atrophy, not elsewhere classified, multiple sites: Secondary | ICD-10-CM | POA: Diagnosis not present

## 2021-01-28 DIAGNOSIS — I10 Essential (primary) hypertension: Secondary | ICD-10-CM | POA: Diagnosis not present

## 2021-01-29 DIAGNOSIS — S83106D Unspecified dislocation of unspecified knee, subsequent encounter: Secondary | ICD-10-CM | POA: Diagnosis not present

## 2021-01-29 DIAGNOSIS — R279 Unspecified lack of coordination: Secondary | ICD-10-CM | POA: Diagnosis not present

## 2021-01-29 DIAGNOSIS — M6281 Muscle weakness (generalized): Secondary | ICD-10-CM | POA: Diagnosis not present

## 2021-01-29 DIAGNOSIS — R2689 Other abnormalities of gait and mobility: Secondary | ICD-10-CM | POA: Diagnosis not present

## 2021-01-29 DIAGNOSIS — M6259 Muscle wasting and atrophy, not elsewhere classified, multiple sites: Secondary | ICD-10-CM | POA: Diagnosis not present

## 2021-01-29 DIAGNOSIS — G8194 Hemiplegia, unspecified affecting left nondominant side: Secondary | ICD-10-CM | POA: Diagnosis not present

## 2021-01-29 DIAGNOSIS — S83104D Unspecified dislocation of right knee, subsequent encounter: Secondary | ICD-10-CM | POA: Diagnosis not present

## 2021-01-29 DIAGNOSIS — I6389 Other cerebral infarction: Secondary | ICD-10-CM | POA: Diagnosis not present

## 2021-01-29 DIAGNOSIS — R293 Abnormal posture: Secondary | ICD-10-CM | POA: Diagnosis not present

## 2021-02-01 DIAGNOSIS — M6259 Muscle wasting and atrophy, not elsewhere classified, multiple sites: Secondary | ICD-10-CM | POA: Diagnosis not present

## 2021-02-01 DIAGNOSIS — F418 Other specified anxiety disorders: Secondary | ICD-10-CM | POA: Diagnosis not present

## 2021-02-01 DIAGNOSIS — F22 Delusional disorders: Secondary | ICD-10-CM | POA: Diagnosis not present

## 2021-02-01 DIAGNOSIS — F251 Schizoaffective disorder, depressive type: Secondary | ICD-10-CM | POA: Diagnosis not present

## 2021-02-01 DIAGNOSIS — R2689 Other abnormalities of gait and mobility: Secondary | ICD-10-CM | POA: Diagnosis not present

## 2021-02-01 DIAGNOSIS — I6389 Other cerebral infarction: Secondary | ICD-10-CM | POA: Diagnosis not present

## 2021-02-01 DIAGNOSIS — F603 Borderline personality disorder: Secondary | ICD-10-CM | POA: Diagnosis not present

## 2021-02-01 DIAGNOSIS — F259 Schizoaffective disorder, unspecified: Secondary | ICD-10-CM | POA: Diagnosis not present

## 2021-02-01 DIAGNOSIS — M1990 Unspecified osteoarthritis, unspecified site: Secondary | ICD-10-CM | POA: Diagnosis not present

## 2021-02-01 DIAGNOSIS — F4323 Adjustment disorder with mixed anxiety and depressed mood: Secondary | ICD-10-CM | POA: Diagnosis not present

## 2021-02-01 DIAGNOSIS — G8190 Hemiplegia, unspecified affecting unspecified side: Secondary | ICD-10-CM | POA: Diagnosis not present

## 2021-02-01 DIAGNOSIS — S83106D Unspecified dislocation of unspecified knee, subsequent encounter: Secondary | ICD-10-CM | POA: Diagnosis not present

## 2021-02-01 DIAGNOSIS — G8194 Hemiplegia, unspecified affecting left nondominant side: Secondary | ICD-10-CM | POA: Diagnosis not present

## 2021-02-01 DIAGNOSIS — M6281 Muscle weakness (generalized): Secondary | ICD-10-CM | POA: Diagnosis not present

## 2021-02-01 DIAGNOSIS — S83104D Unspecified dislocation of right knee, subsequent encounter: Secondary | ICD-10-CM | POA: Diagnosis not present

## 2021-02-01 DIAGNOSIS — R293 Abnormal posture: Secondary | ICD-10-CM | POA: Diagnosis not present

## 2021-02-01 DIAGNOSIS — R279 Unspecified lack of coordination: Secondary | ICD-10-CM | POA: Diagnosis not present

## 2021-02-01 DIAGNOSIS — Z711 Person with feared health complaint in whom no diagnosis is made: Secondary | ICD-10-CM | POA: Diagnosis not present

## 2021-02-02 DIAGNOSIS — S83104D Unspecified dislocation of right knee, subsequent encounter: Secondary | ICD-10-CM | POA: Diagnosis not present

## 2021-02-02 DIAGNOSIS — M6281 Muscle weakness (generalized): Secondary | ICD-10-CM | POA: Diagnosis not present

## 2021-02-02 DIAGNOSIS — S83106D Unspecified dislocation of unspecified knee, subsequent encounter: Secondary | ICD-10-CM | POA: Diagnosis not present

## 2021-02-02 DIAGNOSIS — I6389 Other cerebral infarction: Secondary | ICD-10-CM | POA: Diagnosis not present

## 2021-02-02 DIAGNOSIS — G8194 Hemiplegia, unspecified affecting left nondominant side: Secondary | ICD-10-CM | POA: Diagnosis not present

## 2021-02-02 DIAGNOSIS — R2689 Other abnormalities of gait and mobility: Secondary | ICD-10-CM | POA: Diagnosis not present

## 2021-02-02 DIAGNOSIS — M6259 Muscle wasting and atrophy, not elsewhere classified, multiple sites: Secondary | ICD-10-CM | POA: Diagnosis not present

## 2021-02-02 DIAGNOSIS — R293 Abnormal posture: Secondary | ICD-10-CM | POA: Diagnosis not present

## 2021-02-02 DIAGNOSIS — R279 Unspecified lack of coordination: Secondary | ICD-10-CM | POA: Diagnosis not present

## 2021-02-03 DIAGNOSIS — S83104D Unspecified dislocation of right knee, subsequent encounter: Secondary | ICD-10-CM | POA: Diagnosis not present

## 2021-02-03 DIAGNOSIS — R2689 Other abnormalities of gait and mobility: Secondary | ICD-10-CM | POA: Diagnosis not present

## 2021-02-03 DIAGNOSIS — R293 Abnormal posture: Secondary | ICD-10-CM | POA: Diagnosis not present

## 2021-02-03 DIAGNOSIS — M6281 Muscle weakness (generalized): Secondary | ICD-10-CM | POA: Diagnosis not present

## 2021-02-03 DIAGNOSIS — M6259 Muscle wasting and atrophy, not elsewhere classified, multiple sites: Secondary | ICD-10-CM | POA: Diagnosis not present

## 2021-02-03 DIAGNOSIS — R279 Unspecified lack of coordination: Secondary | ICD-10-CM | POA: Diagnosis not present

## 2021-02-03 DIAGNOSIS — I6389 Other cerebral infarction: Secondary | ICD-10-CM | POA: Diagnosis not present

## 2021-02-03 DIAGNOSIS — G8194 Hemiplegia, unspecified affecting left nondominant side: Secondary | ICD-10-CM | POA: Diagnosis not present

## 2021-02-03 DIAGNOSIS — S83106D Unspecified dislocation of unspecified knee, subsequent encounter: Secondary | ICD-10-CM | POA: Diagnosis not present

## 2021-02-04 DIAGNOSIS — R293 Abnormal posture: Secondary | ICD-10-CM | POA: Diagnosis not present

## 2021-02-04 DIAGNOSIS — S83104D Unspecified dislocation of right knee, subsequent encounter: Secondary | ICD-10-CM | POA: Diagnosis not present

## 2021-02-04 DIAGNOSIS — M6259 Muscle wasting and atrophy, not elsewhere classified, multiple sites: Secondary | ICD-10-CM | POA: Diagnosis not present

## 2021-02-04 DIAGNOSIS — R2689 Other abnormalities of gait and mobility: Secondary | ICD-10-CM | POA: Diagnosis not present

## 2021-02-04 DIAGNOSIS — M6281 Muscle weakness (generalized): Secondary | ICD-10-CM | POA: Diagnosis not present

## 2021-02-04 DIAGNOSIS — I6389 Other cerebral infarction: Secondary | ICD-10-CM | POA: Diagnosis not present

## 2021-02-04 DIAGNOSIS — S83106D Unspecified dislocation of unspecified knee, subsequent encounter: Secondary | ICD-10-CM | POA: Diagnosis not present

## 2021-02-04 DIAGNOSIS — G8194 Hemiplegia, unspecified affecting left nondominant side: Secondary | ICD-10-CM | POA: Diagnosis not present

## 2021-02-04 DIAGNOSIS — R279 Unspecified lack of coordination: Secondary | ICD-10-CM | POA: Diagnosis not present

## 2021-02-05 DIAGNOSIS — R293 Abnormal posture: Secondary | ICD-10-CM | POA: Diagnosis not present

## 2021-02-05 DIAGNOSIS — S83106D Unspecified dislocation of unspecified knee, subsequent encounter: Secondary | ICD-10-CM | POA: Diagnosis not present

## 2021-02-05 DIAGNOSIS — M6281 Muscle weakness (generalized): Secondary | ICD-10-CM | POA: Diagnosis not present

## 2021-02-05 DIAGNOSIS — G8194 Hemiplegia, unspecified affecting left nondominant side: Secondary | ICD-10-CM | POA: Diagnosis not present

## 2021-02-05 DIAGNOSIS — I6389 Other cerebral infarction: Secondary | ICD-10-CM | POA: Diagnosis not present

## 2021-02-05 DIAGNOSIS — S83104D Unspecified dislocation of right knee, subsequent encounter: Secondary | ICD-10-CM | POA: Diagnosis not present

## 2021-02-05 DIAGNOSIS — R279 Unspecified lack of coordination: Secondary | ICD-10-CM | POA: Diagnosis not present

## 2021-02-05 DIAGNOSIS — R2689 Other abnormalities of gait and mobility: Secondary | ICD-10-CM | POA: Diagnosis not present

## 2021-02-05 DIAGNOSIS — M6259 Muscle wasting and atrophy, not elsewhere classified, multiple sites: Secondary | ICD-10-CM | POA: Diagnosis not present

## 2021-02-07 DIAGNOSIS — M6281 Muscle weakness (generalized): Secondary | ICD-10-CM | POA: Diagnosis not present

## 2021-02-07 DIAGNOSIS — S83104D Unspecified dislocation of right knee, subsequent encounter: Secondary | ICD-10-CM | POA: Diagnosis not present

## 2021-02-07 DIAGNOSIS — F418 Other specified anxiety disorders: Secondary | ICD-10-CM | POA: Diagnosis not present

## 2021-02-07 DIAGNOSIS — F603 Borderline personality disorder: Secondary | ICD-10-CM | POA: Diagnosis not present

## 2021-02-07 DIAGNOSIS — R2689 Other abnormalities of gait and mobility: Secondary | ICD-10-CM | POA: Diagnosis not present

## 2021-02-07 DIAGNOSIS — F251 Schizoaffective disorder, depressive type: Secondary | ICD-10-CM | POA: Diagnosis not present

## 2021-02-07 DIAGNOSIS — R293 Abnormal posture: Secondary | ICD-10-CM | POA: Diagnosis not present

## 2021-02-07 DIAGNOSIS — I6389 Other cerebral infarction: Secondary | ICD-10-CM | POA: Diagnosis not present

## 2021-02-07 DIAGNOSIS — M6259 Muscle wasting and atrophy, not elsewhere classified, multiple sites: Secondary | ICD-10-CM | POA: Diagnosis not present

## 2021-02-07 DIAGNOSIS — G8194 Hemiplegia, unspecified affecting left nondominant side: Secondary | ICD-10-CM | POA: Diagnosis not present

## 2021-02-07 DIAGNOSIS — R279 Unspecified lack of coordination: Secondary | ICD-10-CM | POA: Diagnosis not present

## 2021-02-07 DIAGNOSIS — S83106D Unspecified dislocation of unspecified knee, subsequent encounter: Secondary | ICD-10-CM | POA: Diagnosis not present

## 2021-02-08 DIAGNOSIS — R296 Repeated falls: Secondary | ICD-10-CM | POA: Diagnosis not present

## 2021-02-08 DIAGNOSIS — M6259 Muscle wasting and atrophy, not elsewhere classified, multiple sites: Secondary | ICD-10-CM | POA: Diagnosis not present

## 2021-02-08 DIAGNOSIS — I10 Essential (primary) hypertension: Secondary | ICD-10-CM | POA: Diagnosis not present

## 2021-02-08 DIAGNOSIS — M6281 Muscle weakness (generalized): Secondary | ICD-10-CM | POA: Diagnosis not present

## 2021-02-08 DIAGNOSIS — S83106D Unspecified dislocation of unspecified knee, subsequent encounter: Secondary | ICD-10-CM | POA: Diagnosis not present

## 2021-02-08 DIAGNOSIS — Z8673 Personal history of transient ischemic attack (TIA), and cerebral infarction without residual deficits: Secondary | ICD-10-CM | POA: Diagnosis not present

## 2021-02-08 DIAGNOSIS — R293 Abnormal posture: Secondary | ICD-10-CM | POA: Diagnosis not present

## 2021-02-08 DIAGNOSIS — I6389 Other cerebral infarction: Secondary | ICD-10-CM | POA: Diagnosis not present

## 2021-02-08 DIAGNOSIS — R279 Unspecified lack of coordination: Secondary | ICD-10-CM | POA: Diagnosis not present

## 2021-02-08 DIAGNOSIS — G8194 Hemiplegia, unspecified affecting left nondominant side: Secondary | ICD-10-CM | POA: Diagnosis not present

## 2021-02-08 DIAGNOSIS — R2689 Other abnormalities of gait and mobility: Secondary | ICD-10-CM | POA: Diagnosis not present

## 2021-02-08 DIAGNOSIS — S83104D Unspecified dislocation of right knee, subsequent encounter: Secondary | ICD-10-CM | POA: Diagnosis not present

## 2021-02-09 DIAGNOSIS — R293 Abnormal posture: Secondary | ICD-10-CM | POA: Diagnosis not present

## 2021-02-09 DIAGNOSIS — S83106D Unspecified dislocation of unspecified knee, subsequent encounter: Secondary | ICD-10-CM | POA: Diagnosis not present

## 2021-02-09 DIAGNOSIS — G8194 Hemiplegia, unspecified affecting left nondominant side: Secondary | ICD-10-CM | POA: Diagnosis not present

## 2021-02-09 DIAGNOSIS — M6281 Muscle weakness (generalized): Secondary | ICD-10-CM | POA: Diagnosis not present

## 2021-02-09 DIAGNOSIS — M6259 Muscle wasting and atrophy, not elsewhere classified, multiple sites: Secondary | ICD-10-CM | POA: Diagnosis not present

## 2021-02-09 DIAGNOSIS — F251 Schizoaffective disorder, depressive type: Secondary | ICD-10-CM | POA: Diagnosis not present

## 2021-02-09 DIAGNOSIS — R279 Unspecified lack of coordination: Secondary | ICD-10-CM | POA: Diagnosis not present

## 2021-02-09 DIAGNOSIS — S83104D Unspecified dislocation of right knee, subsequent encounter: Secondary | ICD-10-CM | POA: Diagnosis not present

## 2021-02-09 DIAGNOSIS — I6389 Other cerebral infarction: Secondary | ICD-10-CM | POA: Diagnosis not present

## 2021-02-09 DIAGNOSIS — R2689 Other abnormalities of gait and mobility: Secondary | ICD-10-CM | POA: Diagnosis not present

## 2021-02-10 DIAGNOSIS — S83104D Unspecified dislocation of right knee, subsequent encounter: Secondary | ICD-10-CM | POA: Diagnosis not present

## 2021-02-10 DIAGNOSIS — R279 Unspecified lack of coordination: Secondary | ICD-10-CM | POA: Diagnosis not present

## 2021-02-10 DIAGNOSIS — R1312 Dysphagia, oropharyngeal phase: Secondary | ICD-10-CM | POA: Diagnosis not present

## 2021-02-10 DIAGNOSIS — I6389 Other cerebral infarction: Secondary | ICD-10-CM | POA: Diagnosis not present

## 2021-02-10 DIAGNOSIS — S83106D Unspecified dislocation of unspecified knee, subsequent encounter: Secondary | ICD-10-CM | POA: Diagnosis not present

## 2021-02-10 DIAGNOSIS — M6259 Muscle wasting and atrophy, not elsewhere classified, multiple sites: Secondary | ICD-10-CM | POA: Diagnosis not present

## 2021-02-10 DIAGNOSIS — R2689 Other abnormalities of gait and mobility: Secondary | ICD-10-CM | POA: Diagnosis not present

## 2021-02-10 DIAGNOSIS — G8194 Hemiplegia, unspecified affecting left nondominant side: Secondary | ICD-10-CM | POA: Diagnosis not present

## 2021-02-10 DIAGNOSIS — M6281 Muscle weakness (generalized): Secondary | ICD-10-CM | POA: Diagnosis not present

## 2021-02-11 DIAGNOSIS — S83106D Unspecified dislocation of unspecified knee, subsequent encounter: Secondary | ICD-10-CM | POA: Diagnosis not present

## 2021-02-11 DIAGNOSIS — G8194 Hemiplegia, unspecified affecting left nondominant side: Secondary | ICD-10-CM | POA: Diagnosis not present

## 2021-02-11 DIAGNOSIS — R1312 Dysphagia, oropharyngeal phase: Secondary | ICD-10-CM | POA: Diagnosis not present

## 2021-02-11 DIAGNOSIS — M6281 Muscle weakness (generalized): Secondary | ICD-10-CM | POA: Diagnosis not present

## 2021-02-11 DIAGNOSIS — R2689 Other abnormalities of gait and mobility: Secondary | ICD-10-CM | POA: Diagnosis not present

## 2021-02-11 DIAGNOSIS — M6259 Muscle wasting and atrophy, not elsewhere classified, multiple sites: Secondary | ICD-10-CM | POA: Diagnosis not present

## 2021-02-11 DIAGNOSIS — I6389 Other cerebral infarction: Secondary | ICD-10-CM | POA: Diagnosis not present

## 2021-02-11 DIAGNOSIS — R279 Unspecified lack of coordination: Secondary | ICD-10-CM | POA: Diagnosis not present

## 2021-02-11 DIAGNOSIS — S83104D Unspecified dislocation of right knee, subsequent encounter: Secondary | ICD-10-CM | POA: Diagnosis not present

## 2021-02-12 DIAGNOSIS — R279 Unspecified lack of coordination: Secondary | ICD-10-CM | POA: Diagnosis not present

## 2021-02-12 DIAGNOSIS — R2689 Other abnormalities of gait and mobility: Secondary | ICD-10-CM | POA: Diagnosis not present

## 2021-02-12 DIAGNOSIS — G8194 Hemiplegia, unspecified affecting left nondominant side: Secondary | ICD-10-CM | POA: Diagnosis not present

## 2021-02-12 DIAGNOSIS — I6389 Other cerebral infarction: Secondary | ICD-10-CM | POA: Diagnosis not present

## 2021-02-12 DIAGNOSIS — M6281 Muscle weakness (generalized): Secondary | ICD-10-CM | POA: Diagnosis not present

## 2021-02-12 DIAGNOSIS — R1312 Dysphagia, oropharyngeal phase: Secondary | ICD-10-CM | POA: Diagnosis not present

## 2021-02-12 DIAGNOSIS — S83106D Unspecified dislocation of unspecified knee, subsequent encounter: Secondary | ICD-10-CM | POA: Diagnosis not present

## 2021-02-12 DIAGNOSIS — S83104D Unspecified dislocation of right knee, subsequent encounter: Secondary | ICD-10-CM | POA: Diagnosis not present

## 2021-02-12 DIAGNOSIS — M6259 Muscle wasting and atrophy, not elsewhere classified, multiple sites: Secondary | ICD-10-CM | POA: Diagnosis not present

## 2021-02-14 ENCOUNTER — Observation Stay (HOSPITAL_COMMUNITY)
Admission: EM | Admit: 2021-02-14 | Discharge: 2021-02-15 | Disposition: A | Discharge status: Skilled Nursing Facility | Payer: Medicare PPO | Attending: Internal Medicine | Admitting: Internal Medicine

## 2021-02-14 ENCOUNTER — Encounter (HOSPITAL_COMMUNITY): Payer: Self-pay

## 2021-02-14 ENCOUNTER — Emergency Department (HOSPITAL_COMMUNITY): Payer: Medicare PPO

## 2021-02-14 DIAGNOSIS — F259 Schizoaffective disorder, unspecified: Secondary | ICD-10-CM | POA: Diagnosis present

## 2021-02-14 DIAGNOSIS — E876 Hypokalemia: Secondary | ICD-10-CM | POA: Diagnosis present

## 2021-02-14 DIAGNOSIS — Z8673 Personal history of transient ischemic attack (TIA), and cerebral infarction without residual deficits: Secondary | ICD-10-CM | POA: Diagnosis not present

## 2021-02-14 DIAGNOSIS — R279 Unspecified lack of coordination: Secondary | ICD-10-CM | POA: Diagnosis not present

## 2021-02-14 DIAGNOSIS — G9341 Metabolic encephalopathy: Secondary | ICD-10-CM | POA: Diagnosis not present

## 2021-02-14 DIAGNOSIS — M6281 Muscle weakness (generalized): Secondary | ICD-10-CM | POA: Diagnosis not present

## 2021-02-14 DIAGNOSIS — Z20822 Contact with and (suspected) exposure to covid-19: Secondary | ICD-10-CM | POA: Insufficient documentation

## 2021-02-14 DIAGNOSIS — R0600 Dyspnea, unspecified: Secondary | ICD-10-CM | POA: Diagnosis not present

## 2021-02-14 DIAGNOSIS — R4020 Unspecified coma: Secondary | ICD-10-CM | POA: Diagnosis not present

## 2021-02-14 DIAGNOSIS — R55 Syncope and collapse: Secondary | ICD-10-CM | POA: Diagnosis not present

## 2021-02-14 DIAGNOSIS — I10 Essential (primary) hypertension: Secondary | ICD-10-CM | POA: Diagnosis not present

## 2021-02-14 DIAGNOSIS — S83104D Unspecified dislocation of right knee, subsequent encounter: Secondary | ICD-10-CM | POA: Diagnosis not present

## 2021-02-14 DIAGNOSIS — E782 Mixed hyperlipidemia: Secondary | ICD-10-CM | POA: Diagnosis present

## 2021-02-14 DIAGNOSIS — Z96651 Presence of right artificial knee joint: Secondary | ICD-10-CM | POA: Diagnosis not present

## 2021-02-14 DIAGNOSIS — I6389 Other cerebral infarction: Secondary | ICD-10-CM | POA: Diagnosis not present

## 2021-02-14 DIAGNOSIS — R0902 Hypoxemia: Secondary | ICD-10-CM | POA: Diagnosis not present

## 2021-02-14 DIAGNOSIS — R1312 Dysphagia, oropharyngeal phase: Secondary | ICD-10-CM | POA: Diagnosis not present

## 2021-02-14 DIAGNOSIS — M6259 Muscle wasting and atrophy, not elsewhere classified, multiple sites: Secondary | ICD-10-CM | POA: Diagnosis not present

## 2021-02-14 DIAGNOSIS — R4182 Altered mental status, unspecified: Secondary | ICD-10-CM | POA: Diagnosis not present

## 2021-02-14 DIAGNOSIS — Z7189 Other specified counseling: Secondary | ICD-10-CM | POA: Diagnosis not present

## 2021-02-14 DIAGNOSIS — S83106D Unspecified dislocation of unspecified knee, subsequent encounter: Secondary | ICD-10-CM | POA: Diagnosis not present

## 2021-02-14 DIAGNOSIS — R404 Transient alteration of awareness: Secondary | ICD-10-CM | POA: Diagnosis not present

## 2021-02-14 DIAGNOSIS — R739 Hyperglycemia, unspecified: Secondary | ICD-10-CM | POA: Diagnosis not present

## 2021-02-14 DIAGNOSIS — Z79899 Other long term (current) drug therapy: Secondary | ICD-10-CM | POA: Diagnosis not present

## 2021-02-14 DIAGNOSIS — I213 ST elevation (STEMI) myocardial infarction of unspecified site: Secondary | ICD-10-CM | POA: Diagnosis not present

## 2021-02-14 DIAGNOSIS — Z7982 Long term (current) use of aspirin: Secondary | ICD-10-CM | POA: Diagnosis not present

## 2021-02-14 DIAGNOSIS — R2689 Other abnormalities of gait and mobility: Secondary | ICD-10-CM | POA: Diagnosis not present

## 2021-02-14 DIAGNOSIS — G8194 Hemiplegia, unspecified affecting left nondominant side: Secondary | ICD-10-CM | POA: Diagnosis not present

## 2021-02-14 LAB — CBC WITH DIFFERENTIAL/PLATELET
Abs Immature Granulocytes: 0.01 10*3/uL (ref 0.00–0.07)
Basophils Absolute: 0 10*3/uL (ref 0.0–0.1)
Basophils Relative: 1 %
Eosinophils Absolute: 0.2 10*3/uL (ref 0.0–0.5)
Eosinophils Relative: 3 %
HCT: 28.4 % — ABNORMAL LOW (ref 36.0–46.0)
Hemoglobin: 8.7 g/dL — ABNORMAL LOW (ref 12.0–15.0)
Immature Granulocytes: 0 %
Lymphocytes Relative: 26 %
Lymphs Abs: 1.7 10*3/uL (ref 0.7–4.0)
MCH: 28.2 pg (ref 26.0–34.0)
MCHC: 30.6 g/dL (ref 30.0–36.0)
MCV: 91.9 fL (ref 80.0–100.0)
Monocytes Absolute: 0.7 10*3/uL (ref 0.1–1.0)
Monocytes Relative: 11 %
Neutro Abs: 3.7 10*3/uL (ref 1.7–7.7)
Neutrophils Relative %: 59 %
Platelets: 265 10*3/uL (ref 150–400)
RBC: 3.09 MIL/uL — ABNORMAL LOW (ref 3.87–5.11)
RDW: 14.2 % (ref 11.5–15.5)
WBC: 6.3 10*3/uL (ref 4.0–10.5)
nRBC: 0 % (ref 0.0–0.2)

## 2021-02-14 LAB — COMPREHENSIVE METABOLIC PANEL
ALT: 10 U/L (ref 0–44)
AST: 17 U/L (ref 15–41)
Albumin: 3.2 g/dL — ABNORMAL LOW (ref 3.5–5.0)
Alkaline Phosphatase: 42 U/L (ref 38–126)
Anion gap: 10 (ref 5–15)
BUN: 24 mg/dL — ABNORMAL HIGH (ref 8–23)
CO2: 20 mmol/L — ABNORMAL LOW (ref 22–32)
Calcium: 9.3 mg/dL (ref 8.9–10.3)
Chloride: 107 mmol/L (ref 98–111)
Creatinine, Ser: 0.92 mg/dL (ref 0.44–1.00)
GFR, Estimated: >60 mL/min (ref >60)
Glucose, Bld: 152 mg/dL — ABNORMAL HIGH (ref 70–99)
Potassium: 3.4 mmol/L — ABNORMAL LOW (ref 3.5–5.1)
Sodium: 137 mmol/L (ref 135–145)
Total Bilirubin: 0.4 mg/dL (ref 0.3–1.2)
Total Protein: 5.2 g/dL — ABNORMAL LOW (ref 6.5–8.1)

## 2021-02-14 LAB — TROPONIN I (HIGH SENSITIVITY)
Troponin I (High Sensitivity): 9 ng/L (ref <18)
Troponin I (High Sensitivity): 6 ng/L (ref <18)

## 2021-02-14 LAB — LACTIC ACID, PLASMA
Lactic Acid, Venous: 3.3 mmol/L (ref 0.5–1.9)
Lactic Acid, Venous: 2.5 mmol/L (ref 0.5–1.9)

## 2021-02-14 LAB — CBG MONITORING, ED: Glucose-Capillary: 146 mg/dL — ABNORMAL HIGH (ref 70–99)

## 2021-02-14 LAB — RESP PANEL BY RT-PCR (FLU A&B, COVID) ARPGX2
Influenza A by PCR: NEGATIVE
Influenza B by PCR: NEGATIVE
SARS Coronavirus 2 by RT PCR: NEGATIVE

## 2021-02-14 LAB — PROTIME-INR
INR: 1.1 (ref 0.8–1.2)
Prothrombin Time: 13.9 seconds (ref 11.4–15.2)

## 2021-02-14 MED ORDER — SODIUM CHLORIDE 0.9 % IV BOLUS
1000.0000 mL | Freq: Once | INTRAVENOUS | Status: AC
  Administered 2021-02-14: 1000 mL via INTRAVENOUS

## 2021-02-14 MED ORDER — POTASSIUM CHLORIDE 10 MEQ/100ML IV SOLN: 10.0000 meq | INTRAVENOUS | Status: DC

## 2021-02-14 MED ORDER — ONDANSETRON HCL 4 MG PO TABS: 4.0000 mg | ORAL_TABLET | Freq: Four times a day (QID) | ORAL | Status: DC | PRN

## 2021-02-14 MED ORDER — ACETAMINOPHEN 325 MG PO TABS: 650.0000 mg | ORAL_TABLET | Freq: Four times a day (QID) | ORAL | Status: DC | PRN

## 2021-02-14 MED ORDER — POLYETHYLENE GLYCOL 3350 17 G PO PACK: 17.0000 g | PACK | Freq: Every day | ORAL | Status: DC | PRN

## 2021-02-14 MED ORDER — SODIUM CHLORIDE 0.9% FLUSH
3.0000 mL | Freq: Two times a day (BID) | INTRAVENOUS | Status: DC
  Administered 2021-02-15: 3 mL via INTRAVENOUS

## 2021-02-14 MED ORDER — POTASSIUM CHLORIDE IN NACL 40-0.9 MEQ/L-% IV SOLN
INTRAVENOUS | Status: DC
  Filled 2021-02-14: qty 1000

## 2021-02-14 MED ORDER — ONDANSETRON HCL 4 MG/2ML IJ SOLN: 4.0000 mg | Freq: Four times a day (QID) | INTRAMUSCULAR | Status: DC | PRN

## 2021-02-14 MED ORDER — HYDRALAZINE HCL 20 MG/ML IJ SOLN: 10.0000 mg | Freq: Four times a day (QID) | INTRAMUSCULAR | Status: DC | PRN

## 2021-02-14 MED ORDER — POTASSIUM CHLORIDE IN NACL 20-0.9 MEQ/L-% IV SOLN
INTRAVENOUS | Status: DC
  Filled 2021-02-14: qty 1000

## 2021-02-14 MED ORDER — STROKE: EARLY STAGES OF RECOVERY BOOK: Freq: Once | Status: AC

## 2021-02-14 MED ORDER — ACETAMINOPHEN 650 MG RE SUPP: 650.0000 mg | Freq: Four times a day (QID) | RECTAL | Status: DC | PRN

## 2021-02-14 MED ORDER — ENOXAPARIN SODIUM 40 MG/0.4ML IJ SOSY
40.0000 mg | PREFILLED_SYRINGE | INTRAMUSCULAR | Status: DC
  Administered 2021-02-15: 40 mg via SUBCUTANEOUS
  Filled 2021-02-14: qty 0.4

## 2021-02-14 MED ORDER — SODIUM CHLORIDE 0.9 % IV SOLN: INTRAVENOUS | Status: DC

## 2021-02-14 NOTE — Assessment & Plan Note (Signed)
   Permissive hypertension for now until MRI rules out stroke  As needed intravenous antihypertensives for markedly elevated blood pressure.

## 2021-02-14 NOTE — Assessment & Plan Note (Signed)
   Holding all oral psychotropic medications for now while patient is n.p.o.

## 2021-02-14 NOTE — Assessment & Plan Note (Addendum)
   Patient presenting with what was perceived to be loss of consciousness but at this point the inciting event is not completely clear despite receiving adequate account from the EMS staff  Patient has exhibited progressively worsening lethargy and minimal responsiveness throughout her emergency department stay  Patient is currently protecting her airway without any evidence of hypoxia  Daughter reports the patient acting the same way after she suffered a stroke during her September hospitalization  CT head reveals no evidence of intracranial hemorrhage or acute ischemic stroke  Obtaining MRI brain without contrast  Obtaining ABG, serial troponins, urine toxicology screen, urinalysis  Also holding patient's numerous sedating agents including opiate-based analgesics, gabapentin and Zanaflex  EEG ordered for the morning  Will potentially consult neurology if either MRI brain or EEG are abnormal  N.p.o. and on bedrest

## 2021-02-14 NOTE — Assessment & Plan Note (Signed)
   Holding statin therapy while patient is n.p.o.

## 2021-02-14 NOTE — Assessment & Plan Note (Signed)
   Daughter has been informed of patient's guarded prognosis  Daughter has confirmed the patient is a DNR  Daughter does not wish to pursue all appropriate diagnostics and treatments otherwise for now

## 2021-02-14 NOTE — ED Triage Notes (Signed)
Pt BIB [facility] for eval of STEMI, AMS and hypoxia/hypotension. Pt was eating dinner went unresponsive. On EMS arrival, pt BP 72/36. EMS admin rescue breathing d/t GCS 4. EMS also admin 400cc fluid w/ improvement of pressure to 96/42. Pt GCS improved en route as BP increased. GCS 11 on arrival to ED. IV access to L FA #[id number].

## 2021-02-14 NOTE — Assessment & Plan Note (Signed)
   Patient suffered a watershed stroke during hospitalization in September which was thought to have developed while patient was hypotensive postoperatively  Patient has residual left-sided weakness, primarily in the left upper extremity.  Daughter reports patient now uses a wheelchair at baseline  Daughter also reports that this episode of severe lethargy and confusion is similar to what it happened after the patient suffered her last stroke

## 2021-02-14 NOTE — Assessment & Plan Note (Signed)
   Replacing with intravenous potassium chloride  Evaluating for concurrent hypomagnesemia   Monitoring potassium levels with serial chemistries.

## 2021-02-14 NOTE — H&P (Signed)
History and Physical    [name] RKY:[id number] DOB: June 23, 1940 DOA: 02/14/2021  PCP: Jon Billings, NP  Patient coming from: SNF via EMS   Chief Complaint:  Chief Complaint  Patient presents with   Code STEMI     HPI:    80 year old female with past medical history of hypertension, depression, stroke (11/2020), strangulated inguinal hernia (also during 11/2020 admission S/P repair and small bowel resection),  schizoaffective disorder, hyperlipidemia who presents to [facility] emergency department via EMS due to a period of unresponsiveness hypotension and hypoxia.  Patient unable to provide a history due to severe lethargy and majority of history is been obtained from EMS documentation.  Patient was sitting in her wheelchair earlier this evening eating when she suddenly became unresponsive.  Staff immediately noticed this morning and contacted EMS who promptly came to evaluate the patient.  Patient was not observated to be choking and did not appear to be in distress. Patient was initially hypotensive with SBP in 70's and bradycardic.  Due to periods of perceived apnea despite lack of hypoxia ER staff placed patient on nonrebreather mask, initiated a 400 cc isotonic fluid bolus with improvement in BP to 96/42.  EKG was performed and paramedics were concerned for possibility of STEMI, promptly bringing the patient into [facility] emergency department for evaluation.  Upon evaluation in the emergency department review of the EKG revealed the patient was not having a STEMI.  Repeat EKG indeed confirmed that no STEMI was present.  Patient was chest pain-free with a normal troponin.  Patient transiently became more arousable on arrival but quickly became minimally responsive thereafter.   CT head was preformed revealing no acute disease.  The hospitalist group was then called to assess the patient for admission the hospital.  Review of Systems:   Review of  Systems  Unable to perform ROS: Mental acuity   Past Medical History:  Diagnosis Date   Anemia    Colon polyp    History of small bowel obstruction 9/09   likely due to adhesions- pt refused most dx and tx in hospital   History of vaginal bleeding    post menopausal- gyn eval, ? polyp   Hyperlipidemia    Hypertension    Osteoarthritis    knee   Schizoaffective disorder    paranoid personality- refuses treatment   Stroke ([place])    Vitamin D deficiency     Past Surgical History:  Procedure Laterality Date   ANTERIOR AND POSTERIOR REPAIR N/A 04/23/2012   Procedure: ANTERIOR   REPAIR CYSTOCELE;  Surgeon: Reece Packer, MD;  Location: [facility];  Service: Urology;  Laterality: N/A;  cysto;graft 10x6   APPENDECTOMY     bladder tack  1976   BOWEL RESECTION N/A 12/02/2020   Procedure: SMALL BOWEL RESECTION;  Surgeon: Benjamine Sprague, DO;  Location: [facility];  Service: General;  Laterality: N/A;   INGUINAL HERNIA REPAIR Right 12/02/2020   Procedure: HERNIA REPAIR INGUINAL ADULT;  Surgeon: Benjamine Sprague, DO;  Location: [facility];  Service: General;  Laterality: Right;   KNEE ARTHROSCOPY     right   KNEE ARTHROSCOPY Right    KNEE CLOSED REDUCTION Right 12/07/2020   Procedure: CLOSED MANIPULATION KNEE;  Surgeon: Lovell Sheehan, MD;  Location: [facility];  Service: Orthopedics;  Laterality: Right;   KNEE SURGERY Right    LAPAROSCOPY  1970's   twisted fallopian tube   SALPINGOOPHORECTOMY Bilateral 04/23/2012   Procedure: SALPINGO OOPHORECTOMY;  Surgeon: Juliene Pina  Marijo Sanes, MD;  Location: [facility];  Service: Gynecology;  Laterality: Bilateral;   TONSILLECTOMY     TOTAL KNEE REVISION Right 12/24/2020   Procedure: TOTAL KNEE REVISION;  Surgeon: Lovell Sheehan, MD;  Location: [facility];  Service: Orthopedics;  Laterality: Right;   VAGINAL HYSTERECTOMY N/A 04/23/2012   Procedure: HYSTERECTOMY VAGINAL;  Surgeon: Emily Filbert, MD;  Location: [facility];  Service: Gynecology;  Laterality: N/A;   VAGINAL PROLAPSE  REPAIR N/A 04/23/2012   Procedure: VAGINAL VAULT SUSPENSION;  Surgeon: Reece Packer, MD;  Location: [facility];  Service: Urology;  Laterality: N/A;     reports that she does not have a smoking history on file. She has never used smokeless tobacco. She reports that she does not drink alcohol and does not use drugs.  Allergies  Allergen Reactions   Penicillins    Penicillins Rash    TOLERATED CEFAZOLIN    Family History  Problem Relation Age of Onset   Heart disease Mother    Diabetes Mother    Skin cancer Mother    Diabetes Father    Heart disease Father    Hypertension Father    Diabetes Brother    Arthritis Brother    Hypertension Brother    Hyperlipidemia Brother    Arthritis Daughter    Cancer Son    Obesity Daughter      Prior to Admission medications   Medication Sig Start Date End Date Taking? Authorizing Provider  acetaminophen (TYLENOL) 325 MG tablet Take 2 tablets (650 mg total) by mouth every 6 (six) hours as needed for fever, headache or mild pain. 12/10/20   Debbe Odea, MD  acetaminophen (TYLENOL) 325 MG tablet Take 650 mg by mouth in the morning and at bedtime.    [provider]  amLODipine (NORVASC) 10 MG tablet Take 1 tablet (10 mg total) by mouth at bedtime. 12/10/20   Debbe Odea, MD  amLODipine (NORVASC) 10 MG tablet Take 10 mg by mouth at bedtime.    [provider]  ARIPiprazole (ABILIFY) 5 MG tablet Take 5 mg by mouth at bedtime.    [provider]  aspirin 81 MG chewable tablet Chew 1 tablet (81 mg total) by mouth daily. 12/10/20   Debbe Odea, MD  aspirin EC 81 MG tablet Take 81 mg by mouth daily. Swallow whole.    [provider]  carvedilol (COREG) 6.25 MG tablet Take 1 tablet (6.25 mg total) by mouth 2 (two) times daily with a meal. 11/25/20   Jon Billings, NP  carvedilol (COREG) 6.25 MG tablet Take 6.25 mg by mouth 2 (two) times daily with a meal.    [provider]  docusate sodium (COLACE)  100 MG capsule Take 1 capsule (100 mg total) by mouth 2 (two) times daily. 12/10/20   Debbe Odea, MD  enoxaparin (LOVENOX) 40 MG/0.4ML injection Inject 0.4 mLs (40 mg total) into the skin daily for 10 days. 12/28/20 01/07/21  Wouk, Ailene Rud, MD  enoxaparin (LOVENOX) 40 MG/0.4ML injection Inject 40 mg into the skin See admin instructions. Qd x 5 days Patient not taking: No sig reported    [provider]  enoxaparin (LOVENOX) 40 MG/0.4ML injection Inject 40 mg into the skin See admin instructions. Qd x 6 days Patient not taking: No sig reported    [provider]  gabapentin (NEURONTIN) 100 MG capsule Take 1 capsule (100 mg total) by mouth at bedtime. Take 1 capsule at bedtime. If no relief, can  go up to 2 tablets at bedtime after 3 days. 09/09/20   McElwee, Lauren A, NP  gabapentin (NEURONTIN) 100 MG capsule Take 100 mg by mouth at bedtime.    [provider]  HYDROcodone-acetaminophen (NORCO/VICODIN) 5-325 MG tablet Take 1 tablet by mouth every 4 (four) hours as needed for up to 10 doses for severe pain. 12/27/20   Wouk, Ailene Rud, MD  HYDROcodone-acetaminophen (NORCO/VICODIN) 5-325 MG tablet Take 1 tablet by mouth every 12 (twelve) hours as needed for severe pain.    [provider]  lisinopril (ZESTRIL) 40 MG tablet Take 40 mg by mouth daily. Patient not taking: No sig reported    [provider]  Multiple Vitamins-Minerals (DECUBI-VITE PO) Take 1 capsule by mouth daily.    [provider]  NON FORMULARY Take 120 mLs by mouth in the morning and at bedtime. House Supplement    [provider]  nystatin (MYCOSTATIN/NYSTOP) powder Apply 1 application topically 3 (three) times daily.    [provider]  nystatin (MYCOSTATIN/NYSTOP) powder Apply 1 application topically in the morning and at bedtime.    [provider]  Pollen Extracts (PROSTAT PO) Take 30 mLs by mouth in the morning and at bedtime.    [provider]  polyethylene glycol (MIRALAX / GLYCOLAX) 17 g packet Take 17 g by mouth daily. Patient not taking: Reported on 12/28/2020 12/28/20   Wouk, Ailene Rud, MD  polyethylene glycol powder [facility]) 17 GM/SCOOP powder Take 17 g by mouth daily.    [provider]  rosuvastatin (CRESTOR) 10 MG tablet Take 1 tablet (10 mg total) by mouth daily. 11/25/20   Jon Billings, NP  rosuvastatin (CRESTOR) 10 MG tablet Take 10 mg by mouth at bedtime.    [provider]  senna (SENOKOT) 8.6 MG TABS tablet Take 1 tablet (8.6 mg total) by mouth daily. Patient not taking: No sig reported 12/28/20   Wouk, Ailene Rud, MD  sennosides-docusate sodium (SENOKOT-S) 8.6-50 MG tablet Take 1 tablet by mouth in the morning and at bedtime.    [provider]  sertraline (ZOLOFT) 25 MG tablet Take 1 tablet (25 mg total) by mouth daily. 11/25/20   Jon Billings, NP  sertraline (ZOLOFT) 25 MG tablet Take 25 mg by mouth daily.    [provider]  tiZANidine (ZANAFLEX) 4 MG tablet Take 1 tablet (4 mg total) by mouth every 8 (eight) hours as needed for muscle spasms. 12/10/20   Debbe Odea, MD  tiZANidine (ZANAFLEX) 4 MG tablet Take 4 mg by mouth every 8 (eight) hours as needed for muscle spasms.    [provider]  traMADol (ULTRAM) 50 MG tablet Take 1 tablet (50 mg total) by mouth every 6 (six) hours as needed for moderate pain (mild pain). 12/13/20   Debbe Odea, MD  traMADol (ULTRAM) 50 MG tablet Take 50 mg by mouth every 6 (six) hours as needed for moderate pain.    [provider]  triamcinolone (KENALOG) 0.147 MG/GM topical spray Apply 1 spray topically 2 (two) times daily.    [provider]  triamcinolone cream (KENALOG) 0.1 % Apply 1 application topically 2 (two) times daily. 10/12/20   Charyl Dancer, NP    Physical Exam: Vitals:   02/14/21 2115 02/14/21 2245 02/14/21 2300 02/14/21 2315  BP: (!) 108/46 (!) 113/50 (!) 107/53  (!) 104/44  Pulse: (!) 58 66 65 61  Resp: (!) 21 (!) 21 (!) 37 20  Temp:  TempSrc:      SpO2: 98% 98% 95% 96%  Weight:      Height:        Constitutional: Patient is extremely lethargic and minimally arousable to pain.  Patient is currently not in any acute distress. Skin: no rashes, no lesions, poor skin turgor noted. Eyes: Pupils are equally reactive to light, albeit sluggish.  No evidence of scleral icterus or conjunctival pallor.  ENMT: Dry mucous membranes noted.  Posterior pharynx clear of any exudate or lesions.   Neck: normal, supple, no masses, no thyromegaly.  No evidence of jugular venous distension.   Respiratory: Bibasilar rales noted without any evidence of wheezing.  Normal respiratory effort. No accessory muscle use.  Cardiovascular: Regular rate and rhythm, systolic murmur noted, No extremity edema. 2+ pedal pulses. No carotid bruits.  Chest:   Nontender without crepitus or deformity.   Back:   Nontender without crepitus or deformity. Abdomen: Abdomen is soft and nontender.  No evidence of intra-abdominal masses.  Positive bowel sounds noted in all quadrants.   Musculoskeletal: Notable contractures of the left hand.  No other deformities of the extremities or tenderness noted.   Somewhat poor muscle tone.  Neurologic: Patient is extremely lethargic and minimally responsive to pain and verbal stimuli.  Patient does localize to pain.  Patient does not follow commands.  Notable flaccid paralysis of the left upper extremity.   Psychiatric: Unable to assess due to severe lethargy.  Patient currently does not seem to possess insight as to her current situation.   Labs on Admission: I have personally reviewed following labs and imaging studies -   CBC: Recent Labs  Lab 02/14/21 1949  WBC 6.3  NEUTROABS 3.7  HGB 8.7*  HCT 28.4*  MCV 91.9  PLT 174   Basic Metabolic Panel: Recent Labs  Lab 02/14/21 1949  NA 137  K 3.4*  CL 107  CO2 20*  GLUCOSE 152*  BUN 24*   CREATININE 0.92  CALCIUM 9.3   GFR: Estimated Creatinine Clearance: 47.4 mL/min (by C-G formula based on SCr of 0.92 mg/dL). Liver Function Tests: Recent Labs  Lab 02/14/21 1949  AST 17  ALT 10  ALKPHOS 42  BILITOT 0.4  PROT 5.2*  ALBUMIN 3.2*   No results for input(s): LIPASE, AMYLASE in the last 168 hours. No results for input(s): AMMONIA in the last 168 hours. Coagulation Profile: Recent Labs  Lab 02/14/21 1949  INR 1.1   Cardiac Enzymes: No results for input(s): CKTOTAL, CKMB, CKMBINDEX, TROPONINI in the last 168 hours. BNP (last 3 results) No results for input(s): PROBNP in the last 8760 hours. HbA1C: No results for input(s): HGBA1C in the last 72 hours. CBG: Recent Labs  Lab 02/14/21 1954  GLUCAP 146*   Lipid Profile: No results for input(s): CHOL, HDL, LDLCALC, TRIG, CHOLHDL, LDLDIRECT in the last 72 hours. Thyroid Function Tests: No results for input(s): TSH, T4TOTAL, FREET4, T3FREE, THYROIDAB in the last 72 hours. Anemia Panel: No results for input(s): VITAMINB12, FOLATE, FERRITIN, TIBC, IRON, RETICCTPCT in the last 72 hours. Urine analysis:    Component Value Date/Time   COLORURINE YELLOW 12/27/2020 2242   APPEARANCEUR CLEAR 12/27/2020 2242   APPEARANCEUR Cloud[place] (A) 08/31/2020 1039   LABSPEC 1.012 12/27/2020 2242   PHURINE 8.0 12/27/2020 [place] 12/27/2020 2242   HGBUR NEGATIVE 12/27/2020 2242   [place] 12/27/2020 2242   BILIRUBINUR Negative 08/31/2020 [place] 12/27/2020 [place] 12/27/2020 2242  UROBILINOGEN 0.2 06/25/2008 1242   NITRITE NEGATIVE 12/27/2020 2242   LEUKOCYTESUR NEGATIVE 12/27/2020 2242    Radiological Exams on Admission - Personally Reviewed: CT Head Wo Contrast  Result Date: 02/14/2021 CLINICAL DATA:  Altered mental status. EXAM: CT HEAD WITHOUT CONTRAST TECHNIQUE: Contiguous axial images were obtained from the base of the skull through the vertex without  intravenous contrast. COMPARISON:  January 08, 2021 FINDINGS: Brain: There is mild cerebral atrophy with widening of the extra-axial spaces and ventricular dilatation. There are areas of decreased attenuation within the white matter tracts of the supratentorial brain, consistent with microvascular disease changes. A small chronic right occipital lobe infarct is noted. Vascular: No hyperdense vessel or unexpected calcification. Skull: Normal. Negative for fracture or focal lesion. Sinuses/Orbits: No acute finding. Other: None. IMPRESSION: 1. Generalized cerebral atrophy. 2. Small chronic right occipital lobe infarct. 3. No acute intracranial abnormality. Electronically Signed   By: Virgina Norfolk M.D.   On: 02/14/2021 21:07   DG Chest Port 1 View  Result Date: 02/14/2021 CLINICAL DATA:  Dyspnea EXAM: PORTABLE CHEST 1 VIEW COMPARISON:  01/08/2021 FINDINGS: Low lung volumes. No focal opacity or pleural effusion. Stable cardiomediastinal silhouette with aortic atherosclerosis. No pneumothorax. IMPRESSION: No active disease. Electronically Signed   By: Donavan Foil M.D.   On: 02/14/2021 20:52    EKG: Personally reviewed.  Rhythm is sinus bradycardia with heart rate of 55 bpm.  Minimal ST segment elevation noted in the anterior septal leads however this was also present during previous EKGs.  No dynamic ST segment changes appreciated.  Assessment/Plan  * Acute metabolic encephalopathy Patient presenting with what was perceived to be loss of consciousness but at this point the inciting event is not completely clear despite receiving adequate account from the EMS staff Patient has exhibited progressively worsening lethargy and minimal responsiveness throughout her emergency department stay Patient is currently protecting her airway without any evidence of hypoxia Daughter reports the patient acting the same way after she suffered a stroke during her September hospitalization CT head reveals no evidence of  intracranial hemorrhage or acute ischemic stroke Obtaining MRI brain without contrast Obtaining ABG, serial troponins, urine toxicology screen, urinalysis Also holding patient's numerous sedating agents including opiate-based analgesics, gabapentin and Zanaflex EEG ordered for the morning Will potentially consult neurology if either MRI brain or EEG are abnormal N.p.o. and on bedrest  Hypokalemia Replacing with intravenous potassium chloride Evaluating for concurrent hypomagnesemia  Monitoring potassium levels with serial chemistries.   Essential hypertension Permissive hypertension for now until MRI rules out stroke As needed intravenous antihypertensives for markedly elevated blood pressure.  Mixed hyperlipidemia Holding statin therapy while patient is n.p.o.  History of stroke Patient suffered a watershed stroke during hospitalization in September which was thought to have developed while patient was hypotensive postoperatively Patient has residual left-sided weakness, primarily in the left upper extremity. Daughter reports patient now uses a wheelchair at baseline Daughter also reports that this episode of severe lethargy and confusion is similar to what it happened after the patient suffered her last stroke  Schizoaffective disorder ([place]) Holding all oral psychotropic medications for now while patient is n.p.o.  Goals of care, counseling/discussion Daughter has been informed of patient's guarded prognosis Daughter has confirmed the patient is a DNR Daughter does not wish to pursue all appropriate diagnostics and treatments otherwise for now      Code Status:  DNR  code status decision has been confirmed with: daughter Family Communication: Daughter is at bedside and has  been updated on plan of care  Status is: Inpatient  The patient will require care spanning > 2 midnights and should be moved to inpatient because: Severe metabolic encephalopathy requiring extensive  work-up, close clinical monitoring, serial neurologic checks, potential expert consultation with neurology.       Vernelle Emerald MD [facility] Pager [phone]  If 7PM-7AM, please contact night-coverage [email or web address] Use universal [place] password for that web site. If you do not have the password, please call the hospital operator.  02/14/2021, 11:28 PM

## 2021-02-14 NOTE — ED Provider Notes (Signed)
[facility] EMERGENCY DEPARTMENT Provider Note   CSN: [id number] Arrival date & time: 02/14/21  1940     History Chief Complaint  Patient presents with   Code STEMI    [name] is a 80 y.o. female.  80 year old female with prior medical history as detailed below presents for evaluation.  Patient was at her facility.  She apparently was in her wheelchair eating dinner.  She was witnessed to become unresponsive.  EMS was called.  Patient was unresponsive and hypotensive on their initial evaluation.  Patient does have valid DNR with MOST form.  Patient received assisted ventilations for approximately 10 minutes.  During transport the patient became more responsive.  Upon arrival to the ED she appears to be at her baseline.  She is without complaint now.  EMS reports that she was unresponsive for approximately 20 minutes.  Patient without witnessed seizure activity.  Patient was mildly hypotensive with EMS during transport.  This improved with administration of approximately 500 cc of normal saline during transport.  [name] contacted upon arrival of the patient to the ED.  She confirms that the patient is a DNR / DNI.  MOST form allows IV fluids, antibiotics.  The history is provided by the patient, the EMS personnel and a relative.  Loss of Consciousness Episode history:  Single Most recent episode:  Today Duration:  20 minutes Timing:  Constant Progression:  Improving Chronicity:  New Witnessed: yes       Past Medical History:  Diagnosis Date   Anemia    Colon polyp    History of small bowel obstruction 9/09   likely due to adhesions- pt refused most dx and tx in hospital   History of vaginal bleeding    post menopausal- gyn eval, ? polyp   Hyperlipidemia    Hypertension    Osteoarthritis    knee   Schizoaffective disorder    paranoid personality- refuses treatment   Stroke [facility])    Vitamin D deficiency     Patient Active  Problem List   Diagnosis Date Noted   History of CVA (cerebrovascular accident) 12/23/2020   UTI (urinary tract infection) 12/23/2020   Right knee dislocation, initial encounter 12/23/2020   Pressure injury of skin 12/23/2020   Dislocated knee, right, recurrent, initial encounter 12/22/2020   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[facility]) 12/10/2020   Knee dislocation, right, initial encounter 12/10/2020   HLD (hyperlipidemia) 12/05/2020   Depression with anxiety 12/05/2020   Coma ([place]) 12/05/2020   Acute encephalopathy    Strangulated inguinal hernia 12/02/2020   Anxiety 11/25/2020   Aortic valve stenosis 09/09/2020   Prediabetes 05/17/2020   Hypertension 05/10/2020   Age-related osteoporosis without current pathological fracture 01/03/2016   Pure hypercholesterolemia 01/03/2016   Elevated TSH 01/03/2016   Primary osteoarthritis of both knees 12/06/2015   Vitamin D deficiency 06/16/2014   Personal history of other malignant neoplasm of skin 02/19/2014   Prolapse of vaginal vault 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 08/14/2013   Patient on low glycemic diet 08/02/2012   Knee pain 08/02/2012   Gynecological examination 06/20/2011   Neuropathy 06/20/2011   Uterine prolapse 06/20/2011   Anemia 06/18/2009   HYPOKALEMIA, MILD 06/25/2008   INTESTINAL OBSTRUCTION, HX OF 12/11/2007   BLEEDING, POSTMENOPAUSAL 10/23/2006   PERSONALITY DISORDER 10/22/2006   TREMOR, ESSENTIAL 10/22/2006   TRANSAMINASES, SERUM, ELEVATED 10/22/2006    Past Surgical History:  Procedure Laterality Date   ANTERIOR AND POSTERIOR REPAIR N/A 04/23/2012   Procedure: ANTERIOR  REPAIR CYSTOCELE;  Surgeon: Reece Packer, MD;  Location: [place] [facility];  Service: Urology;  Laterality: N/A;  cysto;graft 10x6   APPENDECTOMY     bladder tack  1976   BOWEL RESECTION N/A 12/02/2020   Procedure: SMALL BOWEL RESECTION;  Surgeon: Benjamine Sprague, DO;  Location: [facility];  Service: General;  Laterality: N/A;   INGUINAL HERNIA REPAIR Right 12/02/2020    Procedure: HERNIA REPAIR INGUINAL ADULT;  Surgeon: Benjamine Sprague, DO;  Location: [facility];  Service: General;  Laterality: Right;   KNEE ARTHROSCOPY     right   KNEE ARTHROSCOPY Right    KNEE CLOSED REDUCTION Right 12/07/2020   Procedure: CLOSED MANIPULATION KNEE;  Surgeon: Lovell Sheehan, MD;  Location: [facility];  Service: Orthopedics;  Laterality: Right;   KNEE SURGERY Right    LAPAROSCOPY  1970's   twisted fallopian tube   SALPINGOOPHORECTOMY Bilateral 04/23/2012   Procedure: SALPINGO OOPHORECTOMY;  Surgeon: Emily Filbert, MD;  Location: [place] [facility];  Service: Gynecology;  Laterality: Bilateral;   TONSILLECTOMY     TOTAL KNEE REVISION Right 12/24/2020   Procedure: TOTAL KNEE REVISION;  Surgeon: Lovell Sheehan, MD;  Location: [facility];  Service: Orthopedics;  Laterality: Right;   VAGINAL HYSTERECTOMY N/A 04/23/2012   Procedure: HYSTERECTOMY VAGINAL;  Surgeon: Emily Filbert, MD;  Location: [place] [facility];  Service: Gynecology;  Laterality: N/A;   VAGINAL PROLAPSE REPAIR N/A 04/23/2012   Procedure: VAGINAL VAULT SUSPENSION;  Surgeon: Reece Packer, MD;  Location: [place] [facility];  Service: Urology;  Laterality: N/A;     OB History     Gravida  4   Para  3   Term  0   Preterm  0   AB  1   Living         SAB  1   IAB  0   Ectopic  0   Multiple      Live Births              Family History  Problem Relation Age of Onset   Heart disease Mother    Diabetes Mother    Skin cancer Mother    Diabetes Father    Heart disease Father    Hypertension Father    Diabetes Brother    Arthritis Brother    Hypertension Brother    Hyperlipidemia Brother    Arthritis Daughter    Cancer Son    Obesity Daughter     Social History   Tobacco Use   Smokeless tobacco: Never  Vaping Use   Vaping Use: Never used  Substance Use Topics   Alcohol use: No   Drug use: No    Home Medications Prior to Admission medications   Medication Sig Start Date End Date Taking? Authorizing Provider   acetaminophen (TYLENOL) 325 MG tablet Take 2 tablets (650 mg total) by mouth every 6 (six) hours as needed for fever, headache or mild pain. 12/10/20   Debbe Odea, MD  acetaminophen (TYLENOL) 325 MG tablet Take 650 mg by mouth in the morning and at bedtime.    [provider]  amLODipine (NORVASC) 10 MG tablet Take 1 tablet (10 mg total) by mouth at bedtime. 12/10/20   Debbe Odea, MD  amLODipine (NORVASC) 10 MG tablet Take 10 mg by mouth at bedtime.    [provider]  ARIPiprazole (ABILIFY) 5 MG tablet Take 5 mg by mouth at bedtime.    [provider]  aspirin 81 MG chewable tablet  Chew 1 tablet (81 mg total) by mouth daily. 12/10/20   Debbe Odea, MD  aspirin EC 81 MG tablet Take 81 mg by mouth daily. Swallow whole.    [provider]  carvedilol (COREG) 6.25 MG tablet Take 1 tablet (6.25 mg total) by mouth 2 (two) times daily with a meal. 11/25/20   Jon Billings, NP  carvedilol (COREG) 6.25 MG tablet Take 6.25 mg by mouth 2 (two) times daily with a meal.    [provider]  docusate sodium (COLACE) 100 MG capsule Take 1 capsule (100 mg total) by mouth 2 (two) times daily. 12/10/20   Debbe Odea, MD  enoxaparin (LOVENOX) 40 MG/0.4ML injection Inject 0.4 mLs (40 mg total) into the skin daily for 10 days. 12/28/20 01/07/21  Wouk, Ailene Rud, MD  enoxaparin (LOVENOX) 40 MG/0.4ML injection Inject 40 mg into the skin See admin instructions. Qd x 5 days Patient not taking: No sig reported    [provider]  enoxaparin (LOVENOX) 40 MG/0.4ML injection Inject 40 mg into the skin See admin instructions. Qd x 6 days Patient not taking: No sig reported    [provider]  gabapentin (NEURONTIN) 100 MG capsule Take 1 capsule (100 mg total) by mouth at bedtime. Take 1 capsule at bedtime. If no relief, can go up to 2 tablets at bedtime after 3 days. 09/09/20   McElwee, Lauren A, NP  gabapentin (NEURONTIN) 100 MG capsule Take 100 mg by  mouth at bedtime.    [provider]  HYDROcodone-acetaminophen (NORCO/VICODIN) 5-325 MG tablet Take 1 tablet by mouth every 4 (four) hours as needed for up to 10 doses for severe pain. 12/27/20   Wouk, Ailene Rud, MD  HYDROcodone-acetaminophen (NORCO/VICODIN) 5-325 MG tablet Take 1 tablet by mouth every 12 (twelve) hours as needed for severe pain.    [provider]  lisinopril (ZESTRIL) 40 MG tablet Take 40 mg by mouth daily. Patient not taking: No sig reported    [provider]  Multiple Vitamins-Minerals (DECUBI-VITE PO) Take 1 capsule by mouth daily.    [provider]  NON FORMULARY Take 120 mLs by mouth in the morning and at bedtime. House Supplement    [provider]  nystatin (MYCOSTATIN/NYSTOP) powder Apply 1 application topically 3 (three) times daily.    [provider]  nystatin (MYCOSTATIN/NYSTOP) powder Apply 1 application topically in the morning and at bedtime.    [provider]  Pollen Extracts (PROSTAT PO) Take 30 mLs by mouth in the morning and at bedtime.    [provider]  polyethylene glycol (MIRALAX / GLYCOLAX) 17 g packet Take 17 g by mouth daily. Patient not taking: Reported on 12/28/2020 12/28/20   Wouk, Ailene Rud, MD  polyethylene glycol powder [facility]) 17 GM/SCOOP powder Take 17 g by mouth daily.    [provider]  rosuvastatin (CRESTOR) 10 MG tablet Take 1 tablet (10 mg total) by mouth daily. 11/25/20   Jon Billings, NP  rosuvastatin (CRESTOR) 10 MG tablet Take 10 mg by mouth at bedtime.    [provider]  senna (SENOKOT) 8.6 MG TABS tablet Take 1 tablet (8.6 mg total) by mouth daily. Patient not taking: No sig reported 12/28/20   Wouk, Ailene Rud, MD  sennosides-docusate sodium (SENOKOT-S) 8.6-50 MG tablet Take 1 tablet by mouth in the morning and at bedtime.    [provider]  sertraline (ZOLOFT) 25 MG tablet Take 1 tablet (25 mg total) by  mouth daily. 11/25/20  Jon Billings, NP  sertraline (ZOLOFT) 25 MG tablet Take 25 mg by mouth daily.    [provider]  tiZANidine (ZANAFLEX) 4 MG tablet Take 1 tablet (4 mg total) by mouth every 8 (eight) hours as needed for muscle spasms. 12/10/20   Debbe Odea, MD  tiZANidine (ZANAFLEX) 4 MG tablet Take 4 mg by mouth every 8 (eight) hours as needed for muscle spasms.    [provider]  traMADol (ULTRAM) 50 MG tablet Take 1 tablet (50 mg total) by mouth every 6 (six) hours as needed for moderate pain (mild pain). 12/13/20   Debbe Odea, MD  traMADol (ULTRAM) 50 MG tablet Take 50 mg by mouth every 6 (six) hours as needed for moderate pain.    [provider]  triamcinolone (KENALOG) 0.147 MG/GM topical spray Apply 1 spray topically 2 (two) times daily.    [provider]  triamcinolone cream (KENALOG) 0.1 % Apply 1 application topically 2 (two) times daily. 10/12/20   McElwee, Scheryl Darter, NP    Allergies    Penicillins and Penicillins  Review of Systems   Review of Systems  Unable to perform ROS: Dementia  Cardiovascular:  Positive for syncope.   Physical Exam Updated Vital Signs BP (!) 99/45   Pulse (!) 58   Temp 97.6 F (36.4 C) (Oral)   Resp 20   Ht 5\' 3"  (1.6 m)   Wt 78 kg   SpO2 97%   BMI 30.46 kg/m   Physical Exam Vitals and nursing note reviewed.  Constitutional:      General: She is not in acute distress.    Appearance: Normal appearance. She is well-developed.  HENT:     Head: Normocephalic and atraumatic.  Eyes:     Conjunctiva/sclera: Conjunctivae normal.     Pupils: Pupils are equal, round, and reactive to light.  Cardiovascular:     Rate and Rhythm: Normal rate and regular rhythm.     Heart sounds: Normal heart sounds.  Pulmonary:     Effort: Pulmonary effort is normal. No respiratory distress.     Breath sounds: Normal breath sounds.  Abdominal:     General: There is no distension.     Palpations: Abdomen is  soft.     Tenderness: There is no abdominal tenderness.  Musculoskeletal:        General: No deformity. Normal range of motion.     Cervical back: Normal range of motion and neck supple.  Skin:    General: Skin is warm and dry.  Neurological:     General: No focal deficit present.     Mental Status: She is alert. Mental status is at baseline.    ED Results / Procedures / Treatments   Labs (all labs ordered are listed, but only abnormal results are displayed) Labs Reviewed  COMPREHENSIVE METABOLIC PANEL - Abnormal; Notable for the following components:      Result Value   Potassium 3.4 (*)    CO2 20 (*)    Glucose, Bld 152 (*)    BUN 24 (*)    Total Protein 5.2 (*)    Albumin 3.2 (*)    All other components within normal limits  CBC WITH DIFFERENTIAL/PLATELET - Abnormal; Notable for the following components:   RBC 3.09 (*)    Hemoglobin 8.7 (*)    HCT 28.4 (*)    All other components within normal limits  LACTIC ACID, PLASMA - Abnormal; Notable for the following components:   Lactic Acid,  Venous 3.3 (*)    All other components within normal limits  CBG MONITORING, ED - Abnormal; Notable for the following components:   Glucose-Capillary 146 (*)    All other components within normal limits  RESP PANEL BY RT-PCR (FLU A&B, COVID) ARPGX2  CULTURE, BLOOD (ROUTINE X 2)  CULTURE, BLOOD (ROUTINE X 2)  PROTIME-INR  LACTIC ACID, PLASMA  TROPONIN I (HIGH SENSITIVITY)    EKG EKG Interpretation  Date/Time:  Monday February 14 2021 19:48:57 EST Ventricular Rate:  55 PR Interval:  210 QRS Duration: 91 QT Interval:  513 QTC Calculation: 491 R Axis:   32 Text Interpretation: Sinus rhythm Borderline ST elevation, anterior leads Borderline prolonged QT interval Confirmed by Dene Gentry [phone]) on 02/14/2021 7:51:40 PM  Radiology CT Head Wo Contrast  Result Date: 02/14/2021 CLINICAL DATA:  Altered mental status. EXAM: CT HEAD WITHOUT CONTRAST TECHNIQUE: Contiguous axial images  were obtained from the base of the skull through the vertex without intravenous contrast. COMPARISON:  January 08, 2021 FINDINGS: Brain: There is mild cerebral atrophy with widening of the extra-axial spaces and ventricular dilatation. There are areas of decreased attenuation within the white matter tracts of the supratentorial brain, consistent with microvascular disease changes. A small chronic right occipital lobe infarct is noted. Vascular: No hyperdense vessel or unexpected calcification. Skull: Normal. Negative for fracture or focal lesion. Sinuses/Orbits: No acute finding. Other: None. IMPRESSION: 1. Generalized cerebral atrophy. 2. Small chronic right occipital lobe infarct. 3. No acute intracranial abnormality. Electronically Signed   By: Virgina Norfolk M.D.   On: 02/14/2021 21:07   DG Chest Port 1 View  Result Date: 02/14/2021 CLINICAL DATA:  Dyspnea EXAM: PORTABLE CHEST 1 VIEW COMPARISON:  01/08/2021 FINDINGS: Low lung volumes. No focal opacity or pleural effusion. Stable cardiomediastinal silhouette with aortic atherosclerosis. No pneumothorax. IMPRESSION: No active disease. Electronically Signed   By: Donavan Foil M.D.   On: 02/14/2021 20:52    Procedures Procedures   Medications Ordered in ED Medications  sodium chloride 0.9 % bolus 1,000 mL (has no administration in time range)  sodium chloride 0.9 % bolus 1,000 mL (1,000 mLs Intravenous New Bag/Given 02/14/21 1958)    ED Course  I have reviewed the triage vital signs and the nursing notes.  Pertinent labs & imaging results that were available during my care of the patient were reviewed by me and considered in my medical decision making (see chart for details).    MDM Rules/Calculators/A&P                           CRITICAL CARE Performed by: Valarie Merino   Total critical care time: 45 minutes  Critical care time was exclusive of separately billable procedures and treating other patients.  Critical care was  necessary to treat or prevent imminent or life-threatening deterioration.  Critical care was time spent personally by me on the following activities: development of treatment plan with patient and/or surrogate as well as nursing, discussions with consultants, evaluation of patient's response to treatment, examination of patient, obtaining history from patient or surrogate, ordering and performing treatments and interventions, ordering and review of laboratory studies, ordering and review of radiographic studies, pulse oximetry and re-evaluation of patient's condition.    MDM  MSE complete  [name] was evaluated in Emergency Department on 02/14/2021 for the symptoms described in the history of present illness. She was evaluated in the context of the global COVID-19 pandemic, which  necessitated consideration that the patient might be at risk for infection with the SARS-CoV-2 virus that causes COVID-19. Institutional protocols and algorithms that pertain to the evaluation of patients at risk for COVID-19 are in a state of rapid change based on information released by regulatory bodies including the CDC and federal and state organizations. These policies and algorithms were followed during the patient's care in the ED.  Patient presents from her facility after witnessed syncope.  Patient with approximately 15 to 20 minutes of being poorly responsive.  Upon arrival to the ED she appears to be at baseline.  Patient does have DNR/DNI.  This was confirmed with the patient's daughter at bedside.  Patient was mildly hypotensive with EMS.  Patient without reported recent illness.  Patient's white count is 6.3.  Patient with possible mild dehydration based on labs.  IV fluids administered both by EMS in the ED.  Direct cause of syncope is unclear.  Patient would likely benefit from admission for further observation and work-up.     Final Clinical Impression(s) / ED Diagnoses Final  diagnoses:  Syncope, unspecified syncope type    Rx / DC Orders ED Discharge Orders     None        Valarie Merino, MD 02/14/21 2205

## 2021-02-14 NOTE — Care Plan (Signed)
Code STEMI called.  Patient is 6F who is DNR, NHR with HTN, mood disorder, who had stress test earlier this year (low risk) and echo with normal function.  Earlier today, became unresponsive but no CPR administered.  Currently, BP 80s.  EKG shows borderline ant STE not entirely different from EKG 10/22.  Given clinical scenario and patient's advanced directive, will defer emergent coronary angiography.  Obtain general cardiology consultation if indicated.

## 2021-02-15 ENCOUNTER — Observation Stay (HOSPITAL_COMMUNITY): Payer: Medicare PPO

## 2021-02-15 DIAGNOSIS — I10 Essential (primary) hypertension: Secondary | ICD-10-CM | POA: Diagnosis not present

## 2021-02-15 DIAGNOSIS — R531 Weakness: Secondary | ICD-10-CM | POA: Diagnosis not present

## 2021-02-15 DIAGNOSIS — I959 Hypotension, unspecified: Secondary | ICD-10-CM | POA: Diagnosis not present

## 2021-02-15 DIAGNOSIS — G9341 Metabolic encephalopathy: Secondary | ICD-10-CM | POA: Diagnosis not present

## 2021-02-15 DIAGNOSIS — R4182 Altered mental status, unspecified: Secondary | ICD-10-CM | POA: Diagnosis not present

## 2021-02-15 DIAGNOSIS — R29818 Other symptoms and signs involving the nervous system: Secondary | ICD-10-CM | POA: Diagnosis not present

## 2021-02-15 DIAGNOSIS — N390 Urinary tract infection, site not specified: Secondary | ICD-10-CM | POA: Diagnosis not present

## 2021-02-15 DIAGNOSIS — Z7401 Bed confinement status: Secondary | ICD-10-CM | POA: Diagnosis not present

## 2021-02-15 LAB — I-STAT ARTERIAL BLOOD GAS, ED
Acid-base deficit: 4 mmol/L — ABNORMAL HIGH (ref 0.0–2.0)
Bicarbonate: 20.8 mmol/L (ref 20.0–28.0)
Calcium, Ion: 1.33 mmol/L (ref 1.15–1.40)
HCT: 24 % — ABNORMAL LOW (ref 36.0–46.0)
Hemoglobin: 8.2 g/dL — ABNORMAL LOW (ref 12.0–15.0)
O2 Saturation: 92 %
Patient temperature: 97.6
Potassium: 3.7 mmol/L (ref 3.5–5.1)
Sodium: 143 mmol/L (ref 135–145)
TCO2: 22 mmol/L (ref 22–32)
pCO2 arterial: 37.4 mmHg (ref 32.0–48.0)
pH, Arterial: 7.351 (ref 7.350–7.450)
pO2, Arterial: 66 mmHg — ABNORMAL LOW (ref 83.0–108.0)

## 2021-02-15 LAB — COMPREHENSIVE METABOLIC PANEL
ALT: 8 U/L (ref 0–44)
AST: 14 U/L — ABNORMAL LOW (ref 15–41)
Albumin: 3.2 g/dL — ABNORMAL LOW (ref 3.5–5.0)
Alkaline Phosphatase: 44 U/L (ref 38–126)
Anion gap: 9 (ref 5–15)
BUN: 21 mg/dL (ref 8–23)
CO2: 19 mmol/L — ABNORMAL LOW (ref 22–32)
Calcium: 8.8 mg/dL — ABNORMAL LOW (ref 8.9–10.3)
Chloride: 110 mmol/L (ref 98–111)
Creatinine, Ser: 0.56 mg/dL (ref 0.44–1.00)
GFR, Estimated: >60 mL/min (ref >60)
Glucose, Bld: 81 mg/dL (ref 70–99)
Potassium: 3.6 mmol/L (ref 3.5–5.1)
Sodium: 138 mmol/L (ref 135–145)
Total Bilirubin: 0.4 mg/dL (ref 0.3–1.2)
Total Protein: 5.4 g/dL — ABNORMAL LOW (ref 6.5–8.1)

## 2021-02-15 LAB — RAPID URINE DRUG SCREEN, HOSP PERFORMED
Amphetamines: NOT DETECTED
Barbiturates: NOT DETECTED
Benzodiazepines: NOT DETECTED
Cocaine: NOT DETECTED
Opiates: NOT DETECTED
Tetrahydrocannabinol: NOT DETECTED

## 2021-02-15 LAB — CBC WITH DIFFERENTIAL/PLATELET
Abs Immature Granulocytes: 0.03 10*3/uL (ref 0.00–0.07)
Basophils Absolute: 0 10*3/uL (ref 0.0–0.1)
Basophils Relative: 0 %
Eosinophils Absolute: 0.2 10*3/uL (ref 0.0–0.5)
Eosinophils Relative: 3 %
HCT: 28.8 % — ABNORMAL LOW (ref 36.0–46.0)
Hemoglobin: 9 g/dL — ABNORMAL LOW (ref 12.0–15.0)
Immature Granulocytes: 1 %
Lymphocytes Relative: 26 %
Lymphs Abs: 1.6 10*3/uL (ref 0.7–4.0)
MCH: 28.9 pg (ref 26.0–34.0)
MCHC: 31.3 g/dL (ref 30.0–36.0)
MCV: 92.6 fL (ref 80.0–100.0)
Monocytes Absolute: 0.6 10*3/uL (ref 0.1–1.0)
Monocytes Relative: 10 %
Neutro Abs: 3.7 10*3/uL (ref 1.7–7.7)
Neutrophils Relative %: 60 %
Platelets: 217 10*3/uL (ref 150–400)
RBC: 3.11 MIL/uL — ABNORMAL LOW (ref 3.87–5.11)
RDW: 14.2 % (ref 11.5–15.5)
WBC: 6.1 10*3/uL (ref 4.0–10.5)
nRBC: 0 % (ref 0.0–0.2)

## 2021-02-15 LAB — LIPID PANEL
Cholesterol: 152 mg/dL (ref 0–200)
HDL: 53 mg/dL (ref >40)
LDL Cholesterol: 83 mg/dL (ref 0–99)
Total CHOL/HDL Ratio: 2.9 RATIO
Triglycerides: 82 mg/dL (ref <150)
VLDL: 16 mg/dL (ref 0–40)

## 2021-02-15 LAB — URINALYSIS, COMPLETE (UACMP) WITH MICROSCOPIC
Bilirubin Urine: NEGATIVE
Glucose, UA: NEGATIVE mg/dL
Hgb urine dipstick: NEGATIVE
Ketones, ur: NEGATIVE mg/dL
Nitrite: NEGATIVE
Protein, ur: NEGATIVE mg/dL
Specific Gravity, Urine: 1.02 (ref 1.005–1.030)
WBC, UA: >50 WBC/hpf (ref 0–5)
pH: 6 (ref 5.0–8.0)

## 2021-02-15 LAB — MAGNESIUM: Magnesium: 1.7 mg/dL (ref 1.7–2.4)

## 2021-02-15 LAB — HEMOGLOBIN A1C
Hgb A1c MFr Bld: 4.8 % (ref 4.8–5.6)
Mean Plasma Glucose: 91.06 mg/dL

## 2021-02-15 MED ORDER — LACTATED RINGERS IV SOLN: INTRAVENOUS | Status: DC

## 2021-02-15 MED ORDER — SODIUM CHLORIDE 0.9 % IV SOLN
1.0000 g | Freq: Every day | INTRAVENOUS | Status: DC
  Administered 2021-02-15: 1 g via INTRAVENOUS

## 2021-02-15 MED ORDER — CEPHALEXIN 500 MG PO CAPS: 500.0000 mg | ORAL_CAPSULE | Freq: Two times a day (BID) | ORAL | 0 refills | Status: AC

## 2021-02-15 NOTE — ED Notes (Signed)
PT requested having depends changed. Pt changed, purewick applied.

## 2021-02-15 NOTE — ED Notes (Signed)
RN changed pt brief again prior to transport. Pt verbalized understanding of d/c back to [facility]. LPN at facility denied questions during report. D/C papers sent with pt to facility. Pt leaves in no distress.

## 2021-02-15 NOTE — Discharge Summary (Signed)
Physician Discharge Summary  [name] RAQ:[id number] DOB: 09-10-1940 DOA: 02/14/2021  PCP: Jon Billings, NP  Admit date: 02/14/2021 Discharge date: 02/15/2021  Admitted From: SNF Disposition:  SNF  Recommendations for Outpatient Follow-up:  Follow up with PCP in 1-2 weeks Please obtain BMP/CBC in one week Please follow up on the following pending results:   Discharge Condition:Stable CODE STATUS:FULL Diet recommendation: Heart Healthy   Brief/Interim Summary:  80 year old female with past medical history of hypertension, depression, stroke (11/2020), strangulated inguinal hernia (also during 11/2020 admission S/P repair and small bowel resection),  schizoaffective disorder, hyperlipidemia who presents to [facility] emergency department via EMS due to a period of unresponsiveness hypotension and hypoxia. -Patient was significantly altered upon presentation, she was unable to provide any history initially, she was admitted for further work-up.  Acute metabolic encephalopathy/UTI Patient presenting with what was perceived to be loss of consciousness but at this point the inciting event is not completely clear despite receiving adequate account from the EMS staff Patient has exhibited progressively worsening lethargy and minimal responsiveness throughout her emergency department stay Daughter reports similar presentation during her previous CVA during September hospitalization, CT head with no acute ischemic stroke or intracranial hemorrhage, her MRI brain was reassuring as well, no evidence of acute CVA as well, she was monitored in telemetry, no evidence of pauses or malignant arrhythmias or bradycardia or heart blocks, had negative urine drug screen, this morning she is awake, alert, and appropriate, work-up was significant for abnormal UA, which she was started empirically on IV Rocephin, and she will be discharged another 2 days of oral Keflex. She is awake and  appropriate, she is resumed in diet, she did have good breakfast with assistance as discussed with staff. She declined EEG, I think it is unnecessary at this point.  Hypokalemia Repleted     Essential hypertension Resume back on home medication given negative MRI for acute CVA   Mixed hyperlipidemia Sinew with home dose statin   History of stroke Resume home medications   Schizoaffective disorder ([place]) Resume home medications I was considering lowering Zyprexa dose if she is having recurrent events of altered mental status       Discharge Diagnoses:  Principal Problem:   Acute metabolic encephalopathy Active Problems:   Goals of care, counseling/discussion   Essential hypertension   Mixed hyperlipidemia   History of stroke   Schizoaffective disorder (HCC)   Hypokalemia    Discharge Instructions  Discharge Instructions     Diet - low sodium heart healthy   Complete by: As directed    Discharge instructions   Complete by: As directed    Follow with Primary MD Jon Billings, NP /SNF physician  Get CBC, CMP,  checked  by Primary MD next visit.    Activity: As tolerated with Full fall precautions use walker/cane & assistance as needed   Disposition SNF   Diet: Heart Healthy .   On your next visit with your primary care physician please Get Medicines reviewed and adjusted.   Please request your Prim.MD to go over all Hospital Tests and Procedure/Radiological results at the follow up, please get all Hospital records sent to your Prim MD by signing hospital release before you go home.   If you experience worsening of your admission symptoms, develop shortness of breath, life threatening emergency, suicidal or homicidal thoughts you must seek medical attention immediately by calling 911 or calling your MD immediately  if symptoms less severe.  You Must read  complete instructions/literature along with all the possible adverse reactions/side effects for all  the Medicines you take and that have been prescribed to you. Take any new Medicines after you have completely understood and accpet all the possible adverse reactions/side effects.   Do not drive, operating heavy machinery, perform activities at heights, swimming or participation in water activities or provide baby sitting services if your were admitted for syncope or siezures until you have seen by Primary MD or a Neurologist and advised to do so again.  Do not drive when taking Pain medications.    Do not take more than prescribed Pain, Sleep and Anxiety Medications  Special Instructions: If you have smoked or chewed Tobacco  in the last 2 yrs please stop smoking, stop any regular Alcohol  and or any Recreational drug use.  Wear Seat belts while driving.   Please note  You were cared for by a hospitalist during your hospital stay. If you have any questions about your discharge medications or the care you received while you were in the hospital after you are discharged, you can call the unit and asked to speak with the hospitalist on call if the hospitalist that took care of you is not available. Once you are discharged, your primary care physician will handle any further medical issues. Please note that NO REFILLS for any discharge medications will be authorized once you are discharged, as it is imperative that you return to your primary care physician (or establish a relationship with a primary care physician if you do not have one) for your aftercare needs so that they can reassess your need for medications and monitor your lab values.   Increase activity slowly   Complete by: As directed       Allergies as of 02/15/2021       Reactions   Penicillins    Penicillins Rash   TOLERATED CEFAZOLIN        Medication List     STOP taking these medications    sertraline 25 MG tablet Commonly known as: ZOLOFT       TAKE these medications    acetaminophen 500 MG tablet Commonly  known as: TYLENOL Take 1,000 mg by mouth 2 (two) times daily.   amLODipine 10 MG tablet Commonly known as: NORVASC Take 1 tablet (10 mg total) by mouth at bedtime. What changed:  how much to take additional instructions   aspirin 81 MG chewable tablet Chew 1 tablet (81 mg total) by mouth daily.   Biofreeze 4 % Gel Generic drug: Menthol (Topical Analgesic) Apply 1 application topically daily. Apply to neck ,knees   carvedilol 6.25 MG tablet Commonly known as: COREG Take 1 tablet (6.25 mg total) by mouth 2 (two) times daily with a meal.   cephALEXin 500 MG capsule Commonly known as: KEFLEX Take 1 capsule (500 mg total) by mouth 2 (two) times daily for 2 days. Start taking on: February 16, 2021   DECUBI-VITE PO Take 1 capsule by mouth daily.   diclofenac Sodium 1 % Gel Commonly known as: VOLTAREN Apply 2 g topically daily. Joints/ hand   gabapentin 100 MG capsule Commonly known as: NEURONTIN Take 1 capsule (100 mg total) by mouth at bedtime. Take 1 capsule at bedtime. If no relief, can go up to 2 tablets at bedtime after 3 days. What changed:  how much to take when to take this additional instructions   NON FORMULARY Take 120 mLs by mouth in the morning and at bedtime.  House Supplement   nystatin powder Commonly known as: MYCOSTATIN/NYSTOP Apply 1 application topically 2 (two) times daily.   OLANZapine 2.5 MG tablet Commonly known as: ZYPREXA Take 2.5 mg by mouth daily.   OLANZapine 10 MG tablet Commonly known as: ZYPREXA Take 10 mg by mouth at bedtime.   polyethylene glycol 17 g packet Commonly known as: MIRALAX / GLYCOLAX Take 17 g by mouth daily.   rosuvastatin 10 MG tablet Commonly known as: Crestor Take 1 tablet (10 mg total) by mouth daily.   sennosides-docusate sodium 8.6-50 MG tablet Commonly known as: SENOKOT-S Take 1 tablet by mouth in the morning and at bedtime.   tiZANidine 4 MG tablet Commonly known as: ZANAFLEX Take 1 tablet (4 mg total)  by mouth every 8 (eight) hours as needed for muscle spasms.   traMADol 50 MG tablet Commonly known as: ULTRAM Take 1 tablet (50 mg total) by mouth every 6 (six) hours as needed for moderate pain (mild pain).   triamcinolone cream 0.1 % Commonly known as: KENALOG Apply 1 application topically 2 (two) times daily.        Allergies  Allergen Reactions   Penicillins    Penicillins Rash    TOLERATED CEFAZOLIN    Consultations: None   Procedures/Studies: CT Head Wo Contrast  Result Date: 02/14/2021 CLINICAL DATA:  Altered mental status. EXAM: CT HEAD WITHOUT CONTRAST TECHNIQUE: Contiguous axial images were obtained from the base of the skull through the vertex without intravenous contrast. COMPARISON:  January 08, 2021 FINDINGS: Brain: There is mild cerebral atrophy with widening of the extra-axial spaces and ventricular dilatation. There are areas of decreased attenuation within the white matter tracts of the supratentorial brain, consistent with microvascular disease changes. A small chronic right occipital lobe infarct is noted. Vascular: No hyperdense vessel or unexpected calcification. Skull: Normal. Negative for fracture or focal lesion. Sinuses/Orbits: No acute finding. Other: None. IMPRESSION: 1. Generalized cerebral atrophy. 2. Small chronic right occipital lobe infarct. 3. No acute intracranial abnormality. Electronically Signed   By: Virgina Norfolk M.D.   On: 02/14/2021 21:07   MR BRAIN WO CONTRAST  Result Date: 02/15/2021 CLINICAL DATA:  Acute neurologic deficit EXAM: MRI HEAD WITHOUT CONTRAST TECHNIQUE: Multiplanar, multiecho pulse sequences of the brain and surrounding structures were obtained without intravenous contrast. COMPARISON:  None. FINDINGS: Brain: No acute infarct, mass effect or extra-axial collection. No acute or chronic hemorrhage. Hyperintense T2-weighted signal is moderately widespread throughout the white matter. Generalized volume loss without a clear  lobar predilection. the midline structures are normal. Vascular: Major flow voids are preserved. Skull and upper cervical spine: Normal calvarium and skull base. Visualized upper cervical spine and soft tissues are normal. Sinuses/Orbits:No paranasal sinus fluid levels or advanced mucosal thickening. No mastoid or middle ear effusion. Normal orbits. IMPRESSION: 1. No acute intracranial abnormality. 2. Generalized volume loss and findings of chronic small vessel ischemia. Electronically Signed   By: Ulyses Jarred M.D.   On: 02/15/2021 01:53   DG Chest Port 1 View  Result Date: 02/14/2021 CLINICAL DATA:  Dyspnea EXAM: PORTABLE CHEST 1 VIEW COMPARISON:  01/08/2021 FINDINGS: Low lung volumes. No focal opacity or pleural effusion. Stable cardiomediastinal silhouette with aortic atherosclerosis. No pneumothorax. IMPRESSION: No active disease. Electronically Signed   By: Donavan Foil M.D.   On: 02/14/2021 20:52      Subjective:  Patient denies any dizziness, lightheadedness, she had a good breakfast with assistance.  Discharge Exam: Vitals:   02/15/21 0723 02/15/21 1000  BP: Marland Kitchen)  158/57 (!) 150/60  Pulse: 70 70  Resp: 15 19  Temp: 98 F (36.7 C)   SpO2: 100% 98%   Vitals:   02/15/21 0349 02/15/21 0520 02/15/21 0723 02/15/21 1000  BP:  (!) 141/66 (!) 158/57 (!) 150/60  Pulse:  74 70 70  Resp:  14 15 19   Temp: 97.7 F (36.5 C)  98 F (36.7 C)   TempSrc: Oral  Oral   SpO2:  100% 100% 98%  Weight:      Height:        General: Pt is alert, awake, not in acute distress Cardiovascular: RRR, S1/S2 +, no rubs, no gallops Respiratory: CTA bilaterally, no wheezing, no rhonchi Abdominal: Soft, NT, ND, bowel sounds + Extremities: no edema, no cyanosis    The results of significant diagnostics from this hospitalization (including imaging, microbiology, ancillary and laboratory) are listed below for reference.     Microbiology: Recent Results (from the past 240 hour(s))  Culture, blood  (routine x 2)     Status: None (Preliminary result)   Collection Time: 02/14/21  7:49 PM   Specimen: BLOOD RIGHT FOREARM  Result Value Ref Range Status   Specimen Description BLOOD RIGHT FOREARM  Final   Special Requests   Final    BOTTLES DRAWN AEROBIC AND ANAEROBIC Blood Culture adequate volume   Culture  Setup Time   Final    NO ORGANISMS SEEN ANAEROBIC BOTTLE ONLY reincubated 02/15/21@ 5:07 by TW    Culture   Final    NO GROWTH < 12 HOURS Performed at [facility], [place] [address], [place], [place] [address]    Report Status PENDING  Incomplete  Resp Panel by RT-PCR (Flu A&B, Covid) Nasopharyngeal Swab     Status: None   Collection Time: 02/14/21  7:50 PM   Specimen: Nasopharyngeal Swab; Nasopharyngeal(NP) swabs in vial transport medium  Result Value Ref Range Status   SARS Coronavirus 2 by RT PCR NEGATIVE NEGATIVE Final    Comment: (NOTE) SARS-CoV-2 target nucleic acids are NOT DETECTED.  The SARS-CoV-2 RNA is generally detectable in upper respiratory specimens during the acute phase of infection. The lowest concentration of SARS-CoV-2 viral copies this assay can detect is 138 copies/mL. A negative result does not preclude SARS-Cov-2 infection and should not be used as the sole basis for treatment or other patient management decisions. A negative result may occur with  improper specimen collection/handling, submission of specimen other than nasopharyngeal swab, presence of viral mutation(s) within the areas targeted by this assay, and inadequate number of viral copies(<138 copies/mL). A negative result must be combined with clinical observations, patient history, and epidemiological information. The expected result is Negative.  Fact Sheet for Patients:  EntrepreneurPulse.com.au  Fact Sheet for Healthcare Providers:  IncredibleEmployment.be  This test is no t yet approved or cleared by the [place] FDA and  has been  authorized for detection and/or diagnosis of SARS-CoV-2 by FDA under an Emergency Use Authorization (EUA). This EUA will remain  in effect (meaning this test can be used) for the duration of the COVID-19 declaration under Section 564(b)(1) of the Act, 21 U.S.C.section 360bbb-3(b)(1), unless the authorization is terminated  or revoked sooner.       Influenza A by PCR NEGATIVE NEGATIVE Final   Influenza B by PCR NEGATIVE NEGATIVE Final    Comment: (NOTE) The Xpert Xpress SARS-CoV-2/FLU/RSV plus assay is intended as an aid in the diagnosis of influenza from Nasopharyngeal swab specimens and should not be used  as a sole basis for treatment. Nasal washings and aspirates are unacceptable for Xpert Xpress SARS-CoV-2/FLU/RSV testing.  Fact Sheet for Patients: EntrepreneurPulse.com.au  Fact Sheet for Healthcare Providers: IncredibleEmployment.be  This test is not yet approved or cleared by the [place] FDA and has been authorized for detection and/or diagnosis of SARS-CoV-2 by FDA under an Emergency Use Authorization (EUA). This EUA will remain in effect (meaning this test can be used) for the duration of the COVID-19 declaration under Section 564(b)(1) of the Act, 21 U.S.C. section 360bbb-3(b)(1), unless the authorization is terminated or revoked.  Performed at [facility], [place] [address] [place], [place] [address]   Culture, blood (routine x 2)     Status: None (Preliminary result)   Collection Time: 02/14/21  8:08 PM   Specimen: BLOOD RIGHT HAND  Result Value Ref Range Status   Specimen Description BLOOD RIGHT HAND  Final   Special Requests   Final    BOTTLES DRAWN AEROBIC AND ANAEROBIC Blood Culture results may not be optimal due to an inadequate volume of blood received in culture bottles   Culture   Final    NO GROWTH < 12 HOURS Performed at [facility], [place] [address], [place], [place] [address]    Report Status  PENDING  Incomplete     Labs: BNP (last 3 results) No results for input(s): BNP in the last 8760 hours. Basic Metabolic Panel: Recent Labs  Lab 02/14/21 1949 02/15/21 0004 02/15/21 0328  NA 137 143 138  K 3.4* 3.7 3.6  CL 107  --  110  CO2 20*  --  19*  GLUCOSE 152*  --  81  BUN 24*  --  21  CREATININE 0.92  --  0.56  CALCIUM 9.3  --  8.8*  MG  --   --  1.7   Liver Function Tests: Recent Labs  Lab 02/14/21 1949 02/15/21 0328  AST 17 14*  ALT 10 8  ALKPHOS 42 44  BILITOT 0.4 0.4  PROT 5.2* 5.4*  ALBUMIN 3.2* 3.2*   No results for input(s): LIPASE, AMYLASE in the last 168 hours. No results for input(s): AMMONIA in the last 168 hours. CBC: Recent Labs  Lab 02/14/21 1949 02/15/21 0004 02/15/21 0440  WBC 6.3  --  6.1  NEUTROABS 3.7  --  3.7  HGB 8.7* 8.2* 9.0*  HCT 28.4* 24.0* 28.8*  MCV 91.9  --  92.6  PLT 265  --  217   Cardiac Enzymes: No results for input(s): CKTOTAL, CKMB, CKMBINDEX, TROPONINI in the last 168 hours. BNP: Invalid input(s): POCBNP CBG: Recent Labs  Lab 02/14/21 1954  GLUCAP 146*   D-Dimer No results for input(s): DDIMER in the last 72 hours. Hgb A1c Recent Labs    02/15/21 0440  HGBA1C 4.8   Lipid Profile Recent Labs    02/15/21 0328  CHOL 152  HDL 53  LDLCALC 83  TRIG 82  CHOLHDL 2.9   Thyroid function studies No results for input(s): TSH, T4TOTAL, T3FREE, THYROIDAB in the last 72 hours.  Invalid input(s): FREET3 Anemia work up No results for input(s): VITAMINB12, FOLATE, FERRITIN, TIBC, IRON, RETICCTPCT in the last 72 hours. Urinalysis    Component Value Date/Time   COLORURINE YELLOW 02/15/2021 0541   APPEARANCEUR CLEAR 02/15/2021 0541   APPEARANCEUR Cloudy (A) 08/31/2020 1039   LABSPEC 1.020 02/15/2021 0541   PHURINE 6.0 02/15/2021 0541   GLUCOSEU NEGATIVE 02/15/2021 0541   HGBUR NEGATIVE 02/15/2021 0541  BILIRUBINUR NEGATIVE 02/15/2021 0541   BILIRUBINUR Negative 08/31/2020 1039   [place]  02/15/2021 0541   PROTEINUR NEGATIVE 02/15/2021 0541   UROBILINOGEN 0.2 06/25/2008 1242   NITRITE NEGATIVE 02/15/2021 0541   LEUKOCYTESUR MODERATE (A) 02/15/2021 0541   Sepsis Labs Invalid input(s): PROCALCITONIN,  WBC,  LACTICIDVEN Microbiology Recent Results (from the past 240 hour(s))  Culture, blood (routine x 2)     Status: None (Preliminary result)   Collection Time: 02/14/21  7:49 PM   Specimen: BLOOD RIGHT FOREARM  Result Value Ref Range Status   Specimen Description BLOOD RIGHT FOREARM  Final   Special Requests   Final    BOTTLES DRAWN AEROBIC AND ANAEROBIC Blood Culture adequate volume   Culture  Setup Time   Final    NO ORGANISMS SEEN ANAEROBIC BOTTLE ONLY reincubated 02/15/21@ 5:07 by TW    Culture   Final    NO GROWTH < 12 HOURS Performed at [facility], [place] [address] [place], [place] [address]    Report Status PENDING  Incomplete  Resp Panel by RT-PCR (Flu A&B, Covid) Nasopharyngeal Swab     Status: None   Collection Time: 02/14/21  7:50 PM   Specimen: Nasopharyngeal Swab; Nasopharyngeal(NP) swabs in vial transport medium  Result Value Ref Range Status   SARS Coronavirus 2 by RT PCR NEGATIVE NEGATIVE Final    Comment: (NOTE) SARS-CoV-2 target nucleic acids are NOT DETECTED.  The SARS-CoV-2 RNA is generally detectable in upper respiratory specimens during the acute phase of infection. The lowest concentration of SARS-CoV-2 viral copies this assay can detect is 138 copies/mL. A negative result does not preclude SARS-Cov-2 infection and should not be used as the sole basis for treatment or other patient management decisions. A negative result may occur with  improper specimen collection/handling, submission of specimen other than nasopharyngeal swab, presence of viral mutation(s) within the areas targeted by this assay, and inadequate number of viral copies(<138 copies/mL). A negative result must be combined with clinical observations, patient history,  and epidemiological information. The expected result is Negative.  Fact Sheet for Patients:  EntrepreneurPulse.com.au  Fact Sheet for Healthcare Providers:  IncredibleEmployment.be  This test is no t yet approved or cleared by the [place] FDA and  has been authorized for detection and/or diagnosis of SARS-CoV-2 by FDA under an Emergency Use Authorization (EUA). This EUA will remain  in effect (meaning this test can be used) for the duration of the COVID-19 declaration under Section 564(b)(1) of the Act, 21 U.S.C.section 360bbb-3(b)(1), unless the authorization is terminated  or revoked sooner.       Influenza A by PCR NEGATIVE NEGATIVE Final   Influenza B by PCR NEGATIVE NEGATIVE Final    Comment: (NOTE) The Xpert Xpress SARS-CoV-2/FLU/RSV plus assay is intended as an aid in the diagnosis of influenza from Nasopharyngeal swab specimens and should not be used as a sole basis for treatment. Nasal washings and aspirates are unacceptable for Xpert Xpress SARS-CoV-2/FLU/RSV testing.  Fact Sheet for Patients: EntrepreneurPulse.com.au  Fact Sheet for Healthcare Providers: IncredibleEmployment.be  This test is not yet approved or cleared by the [place] FDA and has been authorized for detection and/or diagnosis of SARS-CoV-2 by FDA under an Emergency Use Authorization (EUA). This EUA will remain in effect (meaning this test can be used) for the duration of the COVID-19 declaration under Section 564(b)(1) of the Act, 21 U.S.C. section 360bbb-3(b)(1), unless the authorization is terminated or revoked.  Performed at [facility],  [address], [place], [place] [address]   Culture, blood (routine x 2)     Status: None (Preliminary result)   Collection Time: 02/14/21  8:08 PM   Specimen: BLOOD RIGHT HAND  Result Value Ref Range Status   Specimen Description BLOOD RIGHT HAND  Final   Special  Requests   Final    BOTTLES DRAWN AEROBIC AND ANAEROBIC Blood Culture results may not be optimal due to an inadequate volume of blood received in culture bottles   Culture   Final    NO GROWTH < 12 HOURS Performed at [facility], [place] [address] [place], [place] [address]    Report Status PENDING  Incomplete     Time coordinating discharge: Over 30 minutes  SIGNED:   Phillips Climes, MD  [facility] 02/15/2021, 11:48 AM Pager   If 7PM-7AM, please contact night-coverage [email or web address] Password [email or web address]

## 2021-02-15 NOTE — ED Notes (Signed)
Dr Waldron [facility] called this RN, indicated pt will likely d/c this afternoon, but to please have SW speak to pt about her current SNF as pt "does not like it." RN spoke to [place] in [place] who will speak to pt.
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
## 2021-02-15 NOTE — ED Notes (Signed)
EEG at bedside, pt refusing test

## 2021-02-15 NOTE — Progress Notes (Signed)
EEG attempted.  Patient refused.

## 2021-02-15 NOTE — Discharge Instructions (Signed)
Follow with Primary MD Jon Billings, NP /SNF physician  Get CBC, CMP,  checked  by Primary MD next visit.    Activity: As tolerated with Full fall precautions use walker/cane & assistance as needed   Disposition SNF   Diet: Heart Healthy .   On your next visit with your primary care physician please Get Medicines reviewed and adjusted.   Please request your Prim.MD to go over all Hospital Tests and Procedure/Radiological results at the follow up, please get all Hospital records sent to your Prim MD by signing hospital release before you go home.   If you experience worsening of your admission symptoms, develop shortness of breath, life threatening emergency, suicidal or homicidal thoughts you must seek medical attention immediately by calling 911 or calling your MD immediately  if symptoms less severe.  You Must read complete instructions/literature along with all the possible adverse reactions/side effects for all the Medicines you take and that have been prescribed to you. Take any new Medicines after you have completely understood and accpet all the possible adverse reactions/side effects.   Do not drive, operating heavy machinery, perform activities at heights, swimming or participation in water activities or provide baby sitting services if your were admitted for syncope or siezures until you have seen by Primary MD or a Neurologist and advised to do so again.  Do not drive when taking Pain medications.    Do not take more than prescribed Pain, Sleep and Anxiety Medications  Special Instructions: If you have smoked or chewed Tobacco  in the last 2 yrs please stop smoking, stop any regular Alcohol  and or any Recreational drug use.  Wear Seat belts while driving.   Please note  You were cared for by a hospitalist during your hospital stay. If you have any questions about your discharge medications or the care you received while you were in the hospital after you are  discharged, you can call the unit and asked to speak with the hospitalist on call if the hospitalist that took care of you is not available. Once you are discharged, your primary care physician will handle any further medical issues. Please note that NO REFILLS for any discharge medications will be authorized once you are discharged, as it is imperative that you return to your primary care physician (or establish a relationship with a primary care physician if you do not have one) for your aftercare needs so that they can reassess your need for medications and monitor your lab values.

## 2021-02-15 NOTE — Progress Notes (Signed)
CSW also received information from [facility] that patient was also given a psych evaluation following her allegations against the female staff and they suspect patient has schizophrenia and paranoia and that she is recalling trauma from her past.

## 2021-02-15 NOTE — ED Notes (Signed)
Pt for d/c back to facility. RN spoke to [place] LPN @ facility for report earlier in shift. RN arranged [facility] transport @ 1200, [facility] stated there were two pts ahead. RN called again @ 1330 and 1500 for updates, pt remains on list with "a couple ahead." RN has changed pt brief x2. Pt had lunch tray. Pt sitting in stretcher singing christmas song at this time. Call light in reach, no distress noted.

## 2021-02-16 DIAGNOSIS — M6259 Muscle wasting and atrophy, not elsewhere classified, multiple sites: Secondary | ICD-10-CM | POA: Diagnosis not present

## 2021-02-16 DIAGNOSIS — R1312 Dysphagia, oropharyngeal phase: Secondary | ICD-10-CM | POA: Diagnosis not present

## 2021-02-16 DIAGNOSIS — M170 Bilateral primary osteoarthritis of knee: Secondary | ICD-10-CM | POA: Diagnosis not present

## 2021-02-16 DIAGNOSIS — R262 Difficulty in walking, not elsewhere classified: Secondary | ICD-10-CM | POA: Diagnosis not present

## 2021-02-16 DIAGNOSIS — R2689 Other abnormalities of gait and mobility: Secondary | ICD-10-CM | POA: Diagnosis not present

## 2021-02-16 DIAGNOSIS — K4030 Unilateral inguinal hernia, with obstruction, without gangrene, not specified as recurrent: Secondary | ICD-10-CM | POA: Diagnosis not present

## 2021-02-16 DIAGNOSIS — F609 Personality disorder, unspecified: Secondary | ICD-10-CM | POA: Diagnosis not present

## 2021-02-16 DIAGNOSIS — S83104D Unspecified dislocation of right knee, subsequent encounter: Secondary | ICD-10-CM | POA: Diagnosis not present

## 2021-02-16 DIAGNOSIS — G8194 Hemiplegia, unspecified affecting left nondominant side: Secondary | ICD-10-CM | POA: Diagnosis not present

## 2021-02-17 DIAGNOSIS — F259 Schizoaffective disorder, unspecified: Secondary | ICD-10-CM | POA: Diagnosis not present

## 2021-02-17 DIAGNOSIS — M6259 Muscle wasting and atrophy, not elsewhere classified, multiple sites: Secondary | ICD-10-CM | POA: Diagnosis not present

## 2021-02-17 DIAGNOSIS — F609 Personality disorder, unspecified: Secondary | ICD-10-CM | POA: Diagnosis not present

## 2021-02-17 DIAGNOSIS — R262 Difficulty in walking, not elsewhere classified: Secondary | ICD-10-CM | POA: Diagnosis not present

## 2021-02-17 DIAGNOSIS — R1312 Dysphagia, oropharyngeal phase: Secondary | ICD-10-CM | POA: Diagnosis not present

## 2021-02-17 DIAGNOSIS — N390 Urinary tract infection, site not specified: Secondary | ICD-10-CM | POA: Diagnosis not present

## 2021-02-17 DIAGNOSIS — K4030 Unilateral inguinal hernia, with obstruction, without gangrene, not specified as recurrent: Secondary | ICD-10-CM | POA: Diagnosis not present

## 2021-02-17 DIAGNOSIS — I10 Essential (primary) hypertension: Secondary | ICD-10-CM | POA: Diagnosis not present

## 2021-02-17 DIAGNOSIS — M170 Bilateral primary osteoarthritis of knee: Secondary | ICD-10-CM | POA: Diagnosis not present

## 2021-02-17 DIAGNOSIS — R2689 Other abnormalities of gait and mobility: Secondary | ICD-10-CM | POA: Diagnosis not present

## 2021-02-17 DIAGNOSIS — D649 Anemia, unspecified: Secondary | ICD-10-CM | POA: Diagnosis not present

## 2021-02-17 DIAGNOSIS — G9341 Metabolic encephalopathy: Secondary | ICD-10-CM | POA: Diagnosis not present

## 2021-02-17 DIAGNOSIS — G8194 Hemiplegia, unspecified affecting left nondominant side: Secondary | ICD-10-CM | POA: Diagnosis not present

## 2021-02-17 DIAGNOSIS — S83104D Unspecified dislocation of right knee, subsequent encounter: Secondary | ICD-10-CM | POA: Diagnosis not present

## 2021-02-17 DIAGNOSIS — I679 Cerebrovascular disease, unspecified: Secondary | ICD-10-CM | POA: Diagnosis not present

## 2021-02-17 LAB — URINE CULTURE

## 2021-02-18 DIAGNOSIS — M170 Bilateral primary osteoarthritis of knee: Secondary | ICD-10-CM | POA: Diagnosis not present

## 2021-02-18 DIAGNOSIS — G8194 Hemiplegia, unspecified affecting left nondominant side: Secondary | ICD-10-CM | POA: Diagnosis not present

## 2021-02-18 DIAGNOSIS — K4030 Unilateral inguinal hernia, with obstruction, without gangrene, not specified as recurrent: Secondary | ICD-10-CM | POA: Diagnosis not present

## 2021-02-18 DIAGNOSIS — R1312 Dysphagia, oropharyngeal phase: Secondary | ICD-10-CM | POA: Diagnosis not present

## 2021-02-18 DIAGNOSIS — F609 Personality disorder, unspecified: Secondary | ICD-10-CM | POA: Diagnosis not present

## 2021-02-18 DIAGNOSIS — S83104D Unspecified dislocation of right knee, subsequent encounter: Secondary | ICD-10-CM | POA: Diagnosis not present

## 2021-02-18 DIAGNOSIS — R262 Difficulty in walking, not elsewhere classified: Secondary | ICD-10-CM | POA: Diagnosis not present

## 2021-02-18 DIAGNOSIS — R2689 Other abnormalities of gait and mobility: Secondary | ICD-10-CM | POA: Diagnosis not present

## 2021-02-18 DIAGNOSIS — M6259 Muscle wasting and atrophy, not elsewhere classified, multiple sites: Secondary | ICD-10-CM | POA: Diagnosis not present

## 2021-02-19 DIAGNOSIS — M170 Bilateral primary osteoarthritis of knee: Secondary | ICD-10-CM | POA: Diagnosis not present

## 2021-02-19 DIAGNOSIS — S83104D Unspecified dislocation of right knee, subsequent encounter: Secondary | ICD-10-CM | POA: Diagnosis not present

## 2021-02-19 DIAGNOSIS — M6259 Muscle wasting and atrophy, not elsewhere classified, multiple sites: Secondary | ICD-10-CM | POA: Diagnosis not present

## 2021-02-19 DIAGNOSIS — R1312 Dysphagia, oropharyngeal phase: Secondary | ICD-10-CM | POA: Diagnosis not present

## 2021-02-19 DIAGNOSIS — R2689 Other abnormalities of gait and mobility: Secondary | ICD-10-CM | POA: Diagnosis not present

## 2021-02-19 DIAGNOSIS — G8194 Hemiplegia, unspecified affecting left nondominant side: Secondary | ICD-10-CM | POA: Diagnosis not present

## 2021-02-19 DIAGNOSIS — R262 Difficulty in walking, not elsewhere classified: Secondary | ICD-10-CM | POA: Diagnosis not present

## 2021-02-19 DIAGNOSIS — K4030 Unilateral inguinal hernia, with obstruction, without gangrene, not specified as recurrent: Secondary | ICD-10-CM | POA: Diagnosis not present

## 2021-02-19 DIAGNOSIS — F609 Personality disorder, unspecified: Secondary | ICD-10-CM | POA: Diagnosis not present

## 2021-02-19 LAB — CULTURE, BLOOD (ROUTINE X 2)
Culture: NO GROWTH
Culture: NO GROWTH
Special Requests: ADEQUATE

## 2021-02-21 DIAGNOSIS — F609 Personality disorder, unspecified: Secondary | ICD-10-CM | POA: Diagnosis not present

## 2021-02-21 DIAGNOSIS — R2689 Other abnormalities of gait and mobility: Secondary | ICD-10-CM | POA: Diagnosis not present

## 2021-02-21 DIAGNOSIS — S83104D Unspecified dislocation of right knee, subsequent encounter: Secondary | ICD-10-CM | POA: Diagnosis not present

## 2021-02-21 DIAGNOSIS — G8194 Hemiplegia, unspecified affecting left nondominant side: Secondary | ICD-10-CM | POA: Diagnosis not present

## 2021-02-21 DIAGNOSIS — R1312 Dysphagia, oropharyngeal phase: Secondary | ICD-10-CM | POA: Diagnosis not present

## 2021-02-21 DIAGNOSIS — M6259 Muscle wasting and atrophy, not elsewhere classified, multiple sites: Secondary | ICD-10-CM | POA: Diagnosis not present

## 2021-02-21 DIAGNOSIS — K4030 Unilateral inguinal hernia, with obstruction, without gangrene, not specified as recurrent: Secondary | ICD-10-CM | POA: Diagnosis not present

## 2021-02-21 DIAGNOSIS — M170 Bilateral primary osteoarthritis of knee: Secondary | ICD-10-CM | POA: Diagnosis not present

## 2021-02-21 DIAGNOSIS — R262 Difficulty in walking, not elsewhere classified: Secondary | ICD-10-CM | POA: Diagnosis not present

## 2021-02-22 DIAGNOSIS — S83104D Unspecified dislocation of right knee, subsequent encounter: Secondary | ICD-10-CM | POA: Diagnosis not present

## 2021-02-22 DIAGNOSIS — M6281 Muscle weakness (generalized): Secondary | ICD-10-CM | POA: Diagnosis not present

## 2021-02-22 DIAGNOSIS — F609 Personality disorder, unspecified: Secondary | ICD-10-CM | POA: Diagnosis not present

## 2021-02-22 DIAGNOSIS — I10 Essential (primary) hypertension: Secondary | ICD-10-CM | POA: Diagnosis not present

## 2021-02-22 DIAGNOSIS — R2689 Other abnormalities of gait and mobility: Secondary | ICD-10-CM | POA: Diagnosis not present

## 2021-02-22 DIAGNOSIS — M170 Bilateral primary osteoarthritis of knee: Secondary | ICD-10-CM | POA: Diagnosis not present

## 2021-02-22 DIAGNOSIS — M6259 Muscle wasting and atrophy, not elsewhere classified, multiple sites: Secondary | ICD-10-CM | POA: Diagnosis not present

## 2021-02-22 DIAGNOSIS — G8194 Hemiplegia, unspecified affecting left nondominant side: Secondary | ICD-10-CM | POA: Diagnosis not present

## 2021-02-22 DIAGNOSIS — K4030 Unilateral inguinal hernia, with obstruction, without gangrene, not specified as recurrent: Secondary | ICD-10-CM | POA: Diagnosis not present

## 2021-02-22 DIAGNOSIS — R262 Difficulty in walking, not elsewhere classified: Secondary | ICD-10-CM | POA: Diagnosis not present

## 2021-02-22 DIAGNOSIS — R1312 Dysphagia, oropharyngeal phase: Secondary | ICD-10-CM | POA: Diagnosis not present

## 2021-02-23 DIAGNOSIS — R1312 Dysphagia, oropharyngeal phase: Secondary | ICD-10-CM | POA: Diagnosis not present

## 2021-02-23 DIAGNOSIS — S83104D Unspecified dislocation of right knee, subsequent encounter: Secondary | ICD-10-CM | POA: Diagnosis not present

## 2021-02-23 DIAGNOSIS — M6259 Muscle wasting and atrophy, not elsewhere classified, multiple sites: Secondary | ICD-10-CM | POA: Diagnosis not present

## 2021-02-23 DIAGNOSIS — G8194 Hemiplegia, unspecified affecting left nondominant side: Secondary | ICD-10-CM | POA: Diagnosis not present

## 2021-02-23 DIAGNOSIS — K4030 Unilateral inguinal hernia, with obstruction, without gangrene, not specified as recurrent: Secondary | ICD-10-CM | POA: Diagnosis not present

## 2021-02-23 DIAGNOSIS — R2689 Other abnormalities of gait and mobility: Secondary | ICD-10-CM | POA: Diagnosis not present

## 2021-02-23 DIAGNOSIS — R262 Difficulty in walking, not elsewhere classified: Secondary | ICD-10-CM | POA: Diagnosis not present

## 2021-02-23 DIAGNOSIS — F609 Personality disorder, unspecified: Secondary | ICD-10-CM | POA: Diagnosis not present

## 2021-02-23 DIAGNOSIS — M170 Bilateral primary osteoarthritis of knee: Secondary | ICD-10-CM | POA: Diagnosis not present

## 2021-02-24 DIAGNOSIS — G8194 Hemiplegia, unspecified affecting left nondominant side: Secondary | ICD-10-CM | POA: Diagnosis not present

## 2021-02-24 DIAGNOSIS — R262 Difficulty in walking, not elsewhere classified: Secondary | ICD-10-CM | POA: Diagnosis not present

## 2021-02-24 DIAGNOSIS — M170 Bilateral primary osteoarthritis of knee: Secondary | ICD-10-CM | POA: Diagnosis not present

## 2021-02-24 DIAGNOSIS — R2689 Other abnormalities of gait and mobility: Secondary | ICD-10-CM | POA: Diagnosis not present

## 2021-02-24 DIAGNOSIS — R1312 Dysphagia, oropharyngeal phase: Secondary | ICD-10-CM | POA: Diagnosis not present

## 2021-02-24 DIAGNOSIS — M6259 Muscle wasting and atrophy, not elsewhere classified, multiple sites: Secondary | ICD-10-CM | POA: Diagnosis not present

## 2021-02-24 DIAGNOSIS — K4030 Unilateral inguinal hernia, with obstruction, without gangrene, not specified as recurrent: Secondary | ICD-10-CM | POA: Diagnosis not present

## 2021-02-24 DIAGNOSIS — S83104D Unspecified dislocation of right knee, subsequent encounter: Secondary | ICD-10-CM | POA: Diagnosis not present

## 2021-02-24 DIAGNOSIS — F609 Personality disorder, unspecified: Secondary | ICD-10-CM | POA: Diagnosis not present

## 2021-02-25 DIAGNOSIS — M6259 Muscle wasting and atrophy, not elsewhere classified, multiple sites: Secondary | ICD-10-CM | POA: Diagnosis not present

## 2021-02-25 DIAGNOSIS — K4030 Unilateral inguinal hernia, with obstruction, without gangrene, not specified as recurrent: Secondary | ICD-10-CM | POA: Diagnosis not present

## 2021-02-25 DIAGNOSIS — R262 Difficulty in walking, not elsewhere classified: Secondary | ICD-10-CM | POA: Diagnosis not present

## 2021-02-25 DIAGNOSIS — M170 Bilateral primary osteoarthritis of knee: Secondary | ICD-10-CM | POA: Diagnosis not present

## 2021-02-25 DIAGNOSIS — S83104D Unspecified dislocation of right knee, subsequent encounter: Secondary | ICD-10-CM | POA: Diagnosis not present

## 2021-02-25 DIAGNOSIS — R2689 Other abnormalities of gait and mobility: Secondary | ICD-10-CM | POA: Diagnosis not present

## 2021-02-25 DIAGNOSIS — R1312 Dysphagia, oropharyngeal phase: Secondary | ICD-10-CM | POA: Diagnosis not present

## 2021-02-25 DIAGNOSIS — F609 Personality disorder, unspecified: Secondary | ICD-10-CM | POA: Diagnosis not present

## 2021-02-25 DIAGNOSIS — G8194 Hemiplegia, unspecified affecting left nondominant side: Secondary | ICD-10-CM | POA: Diagnosis not present

## 2021-02-26 DIAGNOSIS — R262 Difficulty in walking, not elsewhere classified: Secondary | ICD-10-CM | POA: Diagnosis not present

## 2021-02-26 DIAGNOSIS — M6259 Muscle wasting and atrophy, not elsewhere classified, multiple sites: Secondary | ICD-10-CM | POA: Diagnosis not present

## 2021-02-26 DIAGNOSIS — R2689 Other abnormalities of gait and mobility: Secondary | ICD-10-CM | POA: Diagnosis not present

## 2021-02-26 DIAGNOSIS — R1312 Dysphagia, oropharyngeal phase: Secondary | ICD-10-CM | POA: Diagnosis not present

## 2021-02-26 DIAGNOSIS — F609 Personality disorder, unspecified: Secondary | ICD-10-CM | POA: Diagnosis not present

## 2021-02-26 DIAGNOSIS — G8194 Hemiplegia, unspecified affecting left nondominant side: Secondary | ICD-10-CM | POA: Diagnosis not present

## 2021-02-26 DIAGNOSIS — M170 Bilateral primary osteoarthritis of knee: Secondary | ICD-10-CM | POA: Diagnosis not present

## 2021-02-26 DIAGNOSIS — S83104D Unspecified dislocation of right knee, subsequent encounter: Secondary | ICD-10-CM | POA: Diagnosis not present

## 2021-02-26 DIAGNOSIS — K4030 Unilateral inguinal hernia, with obstruction, without gangrene, not specified as recurrent: Secondary | ICD-10-CM | POA: Diagnosis not present

## 2021-02-27 DIAGNOSIS — R1312 Dysphagia, oropharyngeal phase: Secondary | ICD-10-CM | POA: Diagnosis not present

## 2021-02-27 DIAGNOSIS — R2689 Other abnormalities of gait and mobility: Secondary | ICD-10-CM | POA: Diagnosis not present

## 2021-02-27 DIAGNOSIS — M170 Bilateral primary osteoarthritis of knee: Secondary | ICD-10-CM | POA: Diagnosis not present

## 2021-02-27 DIAGNOSIS — S83104D Unspecified dislocation of right knee, subsequent encounter: Secondary | ICD-10-CM | POA: Diagnosis not present

## 2021-02-27 DIAGNOSIS — G8194 Hemiplegia, unspecified affecting left nondominant side: Secondary | ICD-10-CM | POA: Diagnosis not present

## 2021-02-27 DIAGNOSIS — K4030 Unilateral inguinal hernia, with obstruction, without gangrene, not specified as recurrent: Secondary | ICD-10-CM | POA: Diagnosis not present

## 2021-02-27 DIAGNOSIS — M6259 Muscle wasting and atrophy, not elsewhere classified, multiple sites: Secondary | ICD-10-CM | POA: Diagnosis not present

## 2021-02-27 DIAGNOSIS — R262 Difficulty in walking, not elsewhere classified: Secondary | ICD-10-CM | POA: Diagnosis not present

## 2021-02-27 DIAGNOSIS — F609 Personality disorder, unspecified: Secondary | ICD-10-CM | POA: Diagnosis not present

## 2021-02-28 DIAGNOSIS — R262 Difficulty in walking, not elsewhere classified: Secondary | ICD-10-CM | POA: Diagnosis not present

## 2021-02-28 DIAGNOSIS — M170 Bilateral primary osteoarthritis of knee: Secondary | ICD-10-CM | POA: Diagnosis not present

## 2021-02-28 DIAGNOSIS — G8194 Hemiplegia, unspecified affecting left nondominant side: Secondary | ICD-10-CM | POA: Diagnosis not present

## 2021-02-28 DIAGNOSIS — S83104D Unspecified dislocation of right knee, subsequent encounter: Secondary | ICD-10-CM | POA: Diagnosis not present

## 2021-02-28 DIAGNOSIS — R2689 Other abnormalities of gait and mobility: Secondary | ICD-10-CM | POA: Diagnosis not present

## 2021-02-28 DIAGNOSIS — R1312 Dysphagia, oropharyngeal phase: Secondary | ICD-10-CM | POA: Diagnosis not present

## 2021-02-28 DIAGNOSIS — M6259 Muscle wasting and atrophy, not elsewhere classified, multiple sites: Secondary | ICD-10-CM | POA: Diagnosis not present

## 2021-02-28 DIAGNOSIS — K4030 Unilateral inguinal hernia, with obstruction, without gangrene, not specified as recurrent: Secondary | ICD-10-CM | POA: Diagnosis not present

## 2021-02-28 DIAGNOSIS — F609 Personality disorder, unspecified: Secondary | ICD-10-CM | POA: Diagnosis not present

## 2021-03-01 DIAGNOSIS — S83104D Unspecified dislocation of right knee, subsequent encounter: Secondary | ICD-10-CM | POA: Diagnosis not present

## 2021-03-01 DIAGNOSIS — R1312 Dysphagia, oropharyngeal phase: Secondary | ICD-10-CM | POA: Diagnosis not present

## 2021-03-01 DIAGNOSIS — F609 Personality disorder, unspecified: Secondary | ICD-10-CM | POA: Diagnosis not present

## 2021-03-01 DIAGNOSIS — M6259 Muscle wasting and atrophy, not elsewhere classified, multiple sites: Secondary | ICD-10-CM | POA: Diagnosis not present

## 2021-03-01 DIAGNOSIS — G8194 Hemiplegia, unspecified affecting left nondominant side: Secondary | ICD-10-CM | POA: Diagnosis not present

## 2021-03-01 DIAGNOSIS — R262 Difficulty in walking, not elsewhere classified: Secondary | ICD-10-CM | POA: Diagnosis not present

## 2021-03-01 DIAGNOSIS — K4030 Unilateral inguinal hernia, with obstruction, without gangrene, not specified as recurrent: Secondary | ICD-10-CM | POA: Diagnosis not present

## 2021-03-01 DIAGNOSIS — M170 Bilateral primary osteoarthritis of knee: Secondary | ICD-10-CM | POA: Diagnosis not present

## 2021-03-01 DIAGNOSIS — R2689 Other abnormalities of gait and mobility: Secondary | ICD-10-CM | POA: Diagnosis not present

## 2021-03-02 DIAGNOSIS — R2689 Other abnormalities of gait and mobility: Secondary | ICD-10-CM | POA: Diagnosis not present

## 2021-03-02 DIAGNOSIS — G8194 Hemiplegia, unspecified affecting left nondominant side: Secondary | ICD-10-CM | POA: Diagnosis not present

## 2021-03-02 DIAGNOSIS — R1312 Dysphagia, oropharyngeal phase: Secondary | ICD-10-CM | POA: Diagnosis not present

## 2021-03-02 DIAGNOSIS — F609 Personality disorder, unspecified: Secondary | ICD-10-CM | POA: Diagnosis not present

## 2021-03-02 DIAGNOSIS — M170 Bilateral primary osteoarthritis of knee: Secondary | ICD-10-CM | POA: Diagnosis not present

## 2021-03-02 DIAGNOSIS — K4030 Unilateral inguinal hernia, with obstruction, without gangrene, not specified as recurrent: Secondary | ICD-10-CM | POA: Diagnosis not present

## 2021-03-02 DIAGNOSIS — M6259 Muscle wasting and atrophy, not elsewhere classified, multiple sites: Secondary | ICD-10-CM | POA: Diagnosis not present

## 2021-03-02 DIAGNOSIS — S83104D Unspecified dislocation of right knee, subsequent encounter: Secondary | ICD-10-CM | POA: Diagnosis not present

## 2021-03-02 DIAGNOSIS — R262 Difficulty in walking, not elsewhere classified: Secondary | ICD-10-CM | POA: Diagnosis not present

## 2021-03-03 DIAGNOSIS — M170 Bilateral primary osteoarthritis of knee: Secondary | ICD-10-CM | POA: Diagnosis not present

## 2021-03-03 DIAGNOSIS — R1312 Dysphagia, oropharyngeal phase: Secondary | ICD-10-CM | POA: Diagnosis not present

## 2021-03-03 DIAGNOSIS — S83104D Unspecified dislocation of right knee, subsequent encounter: Secondary | ICD-10-CM | POA: Diagnosis not present

## 2021-03-03 DIAGNOSIS — G8194 Hemiplegia, unspecified affecting left nondominant side: Secondary | ICD-10-CM | POA: Diagnosis not present

## 2021-03-03 DIAGNOSIS — M6259 Muscle wasting and atrophy, not elsewhere classified, multiple sites: Secondary | ICD-10-CM | POA: Diagnosis not present

## 2021-03-03 DIAGNOSIS — R262 Difficulty in walking, not elsewhere classified: Secondary | ICD-10-CM | POA: Diagnosis not present

## 2021-03-03 DIAGNOSIS — K4030 Unilateral inguinal hernia, with obstruction, without gangrene, not specified as recurrent: Secondary | ICD-10-CM | POA: Diagnosis not present

## 2021-03-03 DIAGNOSIS — F609 Personality disorder, unspecified: Secondary | ICD-10-CM | POA: Diagnosis not present

## 2021-03-03 DIAGNOSIS — R2689 Other abnormalities of gait and mobility: Secondary | ICD-10-CM | POA: Diagnosis not present

## 2021-03-07 DIAGNOSIS — R2689 Other abnormalities of gait and mobility: Secondary | ICD-10-CM | POA: Diagnosis not present

## 2021-03-07 DIAGNOSIS — R1312 Dysphagia, oropharyngeal phase: Secondary | ICD-10-CM | POA: Diagnosis not present

## 2021-03-07 DIAGNOSIS — M170 Bilateral primary osteoarthritis of knee: Secondary | ICD-10-CM | POA: Diagnosis not present

## 2021-03-07 DIAGNOSIS — G8194 Hemiplegia, unspecified affecting left nondominant side: Secondary | ICD-10-CM | POA: Diagnosis not present

## 2021-03-07 DIAGNOSIS — R262 Difficulty in walking, not elsewhere classified: Secondary | ICD-10-CM | POA: Diagnosis not present

## 2021-03-07 DIAGNOSIS — F609 Personality disorder, unspecified: Secondary | ICD-10-CM | POA: Diagnosis not present

## 2021-03-07 DIAGNOSIS — S83104D Unspecified dislocation of right knee, subsequent encounter: Secondary | ICD-10-CM | POA: Diagnosis not present

## 2021-03-07 DIAGNOSIS — M6259 Muscle wasting and atrophy, not elsewhere classified, multiple sites: Secondary | ICD-10-CM | POA: Diagnosis not present

## 2021-03-07 DIAGNOSIS — K4030 Unilateral inguinal hernia, with obstruction, without gangrene, not specified as recurrent: Secondary | ICD-10-CM | POA: Diagnosis not present

## 2021-03-08 DIAGNOSIS — G8194 Hemiplegia, unspecified affecting left nondominant side: Secondary | ICD-10-CM | POA: Diagnosis not present

## 2021-03-08 DIAGNOSIS — S83104D Unspecified dislocation of right knee, subsequent encounter: Secondary | ICD-10-CM | POA: Diagnosis not present

## 2021-03-08 DIAGNOSIS — R1312 Dysphagia, oropharyngeal phase: Secondary | ICD-10-CM | POA: Diagnosis not present

## 2021-03-08 DIAGNOSIS — R262 Difficulty in walking, not elsewhere classified: Secondary | ICD-10-CM | POA: Diagnosis not present

## 2021-03-08 DIAGNOSIS — F609 Personality disorder, unspecified: Secondary | ICD-10-CM | POA: Diagnosis not present

## 2021-03-08 DIAGNOSIS — R2689 Other abnormalities of gait and mobility: Secondary | ICD-10-CM | POA: Diagnosis not present

## 2021-03-08 DIAGNOSIS — K4030 Unilateral inguinal hernia, with obstruction, without gangrene, not specified as recurrent: Secondary | ICD-10-CM | POA: Diagnosis not present

## 2021-03-08 DIAGNOSIS — M6259 Muscle wasting and atrophy, not elsewhere classified, multiple sites: Secondary | ICD-10-CM | POA: Diagnosis not present

## 2021-03-08 DIAGNOSIS — M170 Bilateral primary osteoarthritis of knee: Secondary | ICD-10-CM | POA: Diagnosis not present

## 2021-03-09 DIAGNOSIS — F609 Personality disorder, unspecified: Secondary | ICD-10-CM | POA: Diagnosis not present

## 2021-03-09 DIAGNOSIS — G8194 Hemiplegia, unspecified affecting left nondominant side: Secondary | ICD-10-CM | POA: Diagnosis not present

## 2021-03-09 DIAGNOSIS — R1312 Dysphagia, oropharyngeal phase: Secondary | ICD-10-CM | POA: Diagnosis not present

## 2021-03-09 DIAGNOSIS — M170 Bilateral primary osteoarthritis of knee: Secondary | ICD-10-CM | POA: Diagnosis not present

## 2021-03-09 DIAGNOSIS — R2689 Other abnormalities of gait and mobility: Secondary | ICD-10-CM | POA: Diagnosis not present

## 2021-03-09 DIAGNOSIS — R262 Difficulty in walking, not elsewhere classified: Secondary | ICD-10-CM | POA: Diagnosis not present

## 2021-03-09 DIAGNOSIS — K4030 Unilateral inguinal hernia, with obstruction, without gangrene, not specified as recurrent: Secondary | ICD-10-CM | POA: Diagnosis not present

## 2021-03-09 DIAGNOSIS — S83104D Unspecified dislocation of right knee, subsequent encounter: Secondary | ICD-10-CM | POA: Diagnosis not present

## 2021-03-09 DIAGNOSIS — M6259 Muscle wasting and atrophy, not elsewhere classified, multiple sites: Secondary | ICD-10-CM | POA: Diagnosis not present

## 2021-03-10 DIAGNOSIS — M542 Cervicalgia: Secondary | ICD-10-CM | POA: Diagnosis not present

## 2021-03-10 DIAGNOSIS — M170 Bilateral primary osteoarthritis of knee: Secondary | ICD-10-CM | POA: Diagnosis not present

## 2021-03-10 DIAGNOSIS — F609 Personality disorder, unspecified: Secondary | ICD-10-CM | POA: Diagnosis not present

## 2021-03-10 DIAGNOSIS — K4030 Unilateral inguinal hernia, with obstruction, without gangrene, not specified as recurrent: Secondary | ICD-10-CM | POA: Diagnosis not present

## 2021-03-10 DIAGNOSIS — R262 Difficulty in walking, not elsewhere classified: Secondary | ICD-10-CM | POA: Diagnosis not present

## 2021-03-10 DIAGNOSIS — S83104D Unspecified dislocation of right knee, subsequent encounter: Secondary | ICD-10-CM | POA: Diagnosis not present

## 2021-03-10 DIAGNOSIS — I10 Essential (primary) hypertension: Secondary | ICD-10-CM | POA: Diagnosis not present

## 2021-03-10 DIAGNOSIS — R1312 Dysphagia, oropharyngeal phase: Secondary | ICD-10-CM | POA: Diagnosis not present

## 2021-03-10 DIAGNOSIS — R251 Tremor, unspecified: Secondary | ICD-10-CM | POA: Diagnosis not present

## 2021-03-10 DIAGNOSIS — M6259 Muscle wasting and atrophy, not elsewhere classified, multiple sites: Secondary | ICD-10-CM | POA: Diagnosis not present

## 2021-03-10 DIAGNOSIS — R2689 Other abnormalities of gait and mobility: Secondary | ICD-10-CM | POA: Diagnosis not present

## 2021-03-10 DIAGNOSIS — G8194 Hemiplegia, unspecified affecting left nondominant side: Secondary | ICD-10-CM | POA: Diagnosis not present

## 2021-03-10 DIAGNOSIS — F259 Schizoaffective disorder, unspecified: Secondary | ICD-10-CM | POA: Diagnosis not present

## 2021-03-11 DIAGNOSIS — G8194 Hemiplegia, unspecified affecting left nondominant side: Secondary | ICD-10-CM | POA: Diagnosis not present

## 2021-03-11 DIAGNOSIS — M6259 Muscle wasting and atrophy, not elsewhere classified, multiple sites: Secondary | ICD-10-CM | POA: Diagnosis not present

## 2021-03-11 DIAGNOSIS — F609 Personality disorder, unspecified: Secondary | ICD-10-CM | POA: Diagnosis not present

## 2021-03-11 DIAGNOSIS — R1312 Dysphagia, oropharyngeal phase: Secondary | ICD-10-CM | POA: Diagnosis not present

## 2021-03-11 DIAGNOSIS — M170 Bilateral primary osteoarthritis of knee: Secondary | ICD-10-CM | POA: Diagnosis not present

## 2021-03-11 DIAGNOSIS — R262 Difficulty in walking, not elsewhere classified: Secondary | ICD-10-CM | POA: Diagnosis not present

## 2021-03-11 DIAGNOSIS — K4030 Unilateral inguinal hernia, with obstruction, without gangrene, not specified as recurrent: Secondary | ICD-10-CM | POA: Diagnosis not present

## 2021-03-11 DIAGNOSIS — R2689 Other abnormalities of gait and mobility: Secondary | ICD-10-CM | POA: Diagnosis not present

## 2021-03-11 DIAGNOSIS — S83104D Unspecified dislocation of right knee, subsequent encounter: Secondary | ICD-10-CM | POA: Diagnosis not present

## 2021-04-14 DIAGNOSIS — I69359 Hemiplegia and hemiparesis following cerebral infarction affecting unspecified side: Secondary | ICD-10-CM | POA: Diagnosis not present

## 2021-04-14 DIAGNOSIS — F0150 Vascular dementia without behavioral disturbance: Secondary | ICD-10-CM | POA: Diagnosis not present

## 2021-04-14 DIAGNOSIS — U071 COVID-19: Secondary | ICD-10-CM | POA: Diagnosis not present

## 2021-04-15 DIAGNOSIS — L308 Other specified dermatitis: Secondary | ICD-10-CM | POA: Diagnosis not present

## 2021-04-15 DIAGNOSIS — U071 COVID-19: Secondary | ICD-10-CM | POA: Diagnosis not present

## 2021-04-25 DIAGNOSIS — R262 Difficulty in walking, not elsewhere classified: Secondary | ICD-10-CM | POA: Diagnosis not present

## 2021-04-25 DIAGNOSIS — U071 COVID-19: Secondary | ICD-10-CM | POA: Diagnosis not present

## 2021-04-25 DIAGNOSIS — M6259 Muscle wasting and atrophy, not elsewhere classified, multiple sites: Secondary | ICD-10-CM | POA: Diagnosis not present

## 2021-04-25 DIAGNOSIS — R278 Other lack of coordination: Secondary | ICD-10-CM | POA: Diagnosis not present

## 2021-04-25 DIAGNOSIS — R1312 Dysphagia, oropharyngeal phase: Secondary | ICD-10-CM | POA: Diagnosis not present

## 2021-04-25 DIAGNOSIS — S83104D Unspecified dislocation of right knee, subsequent encounter: Secondary | ICD-10-CM | POA: Diagnosis not present

## 2021-04-25 DIAGNOSIS — R2689 Other abnormalities of gait and mobility: Secondary | ICD-10-CM | POA: Diagnosis not present

## 2021-04-25 DIAGNOSIS — S83104A Unspecified dislocation of right knee, initial encounter: Secondary | ICD-10-CM | POA: Diagnosis not present

## 2021-04-25 DIAGNOSIS — R41841 Cognitive communication deficit: Secondary | ICD-10-CM | POA: Diagnosis not present

## 2021-04-26 DIAGNOSIS — R262 Difficulty in walking, not elsewhere classified: Secondary | ICD-10-CM | POA: Diagnosis not present

## 2021-04-26 DIAGNOSIS — R41841 Cognitive communication deficit: Secondary | ICD-10-CM | POA: Diagnosis not present

## 2021-04-26 DIAGNOSIS — R1312 Dysphagia, oropharyngeal phase: Secondary | ICD-10-CM | POA: Diagnosis not present

## 2021-04-26 DIAGNOSIS — S83104D Unspecified dislocation of right knee, subsequent encounter: Secondary | ICD-10-CM | POA: Diagnosis not present

## 2021-04-26 DIAGNOSIS — U071 COVID-19: Secondary | ICD-10-CM | POA: Diagnosis not present

## 2021-04-26 DIAGNOSIS — S83104A Unspecified dislocation of right knee, initial encounter: Secondary | ICD-10-CM | POA: Diagnosis not present

## 2021-04-26 DIAGNOSIS — M6259 Muscle wasting and atrophy, not elsewhere classified, multiple sites: Secondary | ICD-10-CM | POA: Diagnosis not present

## 2021-04-26 DIAGNOSIS — R2689 Other abnormalities of gait and mobility: Secondary | ICD-10-CM | POA: Diagnosis not present

## 2021-04-26 DIAGNOSIS — R278 Other lack of coordination: Secondary | ICD-10-CM | POA: Diagnosis not present

## 2021-04-28 DIAGNOSIS — R278 Other lack of coordination: Secondary | ICD-10-CM | POA: Diagnosis not present

## 2021-04-28 DIAGNOSIS — S83104A Unspecified dislocation of right knee, initial encounter: Secondary | ICD-10-CM | POA: Diagnosis not present

## 2021-04-28 DIAGNOSIS — S83104D Unspecified dislocation of right knee, subsequent encounter: Secondary | ICD-10-CM | POA: Diagnosis not present

## 2021-04-28 DIAGNOSIS — R2689 Other abnormalities of gait and mobility: Secondary | ICD-10-CM | POA: Diagnosis not present

## 2021-04-28 DIAGNOSIS — R1312 Dysphagia, oropharyngeal phase: Secondary | ICD-10-CM | POA: Diagnosis not present

## 2021-04-28 DIAGNOSIS — R262 Difficulty in walking, not elsewhere classified: Secondary | ICD-10-CM | POA: Diagnosis not present

## 2021-04-28 DIAGNOSIS — R41841 Cognitive communication deficit: Secondary | ICD-10-CM | POA: Diagnosis not present

## 2021-04-28 DIAGNOSIS — M6259 Muscle wasting and atrophy, not elsewhere classified, multiple sites: Secondary | ICD-10-CM | POA: Diagnosis not present

## 2021-04-28 DIAGNOSIS — U071 COVID-19: Secondary | ICD-10-CM | POA: Diagnosis not present

## 2021-04-29 DIAGNOSIS — S83104A Unspecified dislocation of right knee, initial encounter: Secondary | ICD-10-CM | POA: Diagnosis not present

## 2021-04-29 DIAGNOSIS — M6259 Muscle wasting and atrophy, not elsewhere classified, multiple sites: Secondary | ICD-10-CM | POA: Diagnosis not present

## 2021-04-29 DIAGNOSIS — R41841 Cognitive communication deficit: Secondary | ICD-10-CM | POA: Diagnosis not present

## 2021-04-29 DIAGNOSIS — R1312 Dysphagia, oropharyngeal phase: Secondary | ICD-10-CM | POA: Diagnosis not present

## 2021-04-29 DIAGNOSIS — U071 COVID-19: Secondary | ICD-10-CM | POA: Diagnosis not present

## 2021-04-29 DIAGNOSIS — S83104D Unspecified dislocation of right knee, subsequent encounter: Secondary | ICD-10-CM | POA: Diagnosis not present

## 2021-04-29 DIAGNOSIS — R2689 Other abnormalities of gait and mobility: Secondary | ICD-10-CM | POA: Diagnosis not present

## 2021-04-29 DIAGNOSIS — R262 Difficulty in walking, not elsewhere classified: Secondary | ICD-10-CM | POA: Diagnosis not present

## 2021-04-29 DIAGNOSIS — R278 Other lack of coordination: Secondary | ICD-10-CM | POA: Diagnosis not present

## 2021-05-02 DIAGNOSIS — R278 Other lack of coordination: Secondary | ICD-10-CM | POA: Diagnosis not present

## 2021-05-02 DIAGNOSIS — R41841 Cognitive communication deficit: Secondary | ICD-10-CM | POA: Diagnosis not present

## 2021-05-02 DIAGNOSIS — S83104D Unspecified dislocation of right knee, subsequent encounter: Secondary | ICD-10-CM | POA: Diagnosis not present

## 2021-05-02 DIAGNOSIS — M6259 Muscle wasting and atrophy, not elsewhere classified, multiple sites: Secondary | ICD-10-CM | POA: Diagnosis not present

## 2021-05-02 DIAGNOSIS — R262 Difficulty in walking, not elsewhere classified: Secondary | ICD-10-CM | POA: Diagnosis not present

## 2021-05-02 DIAGNOSIS — R1312 Dysphagia, oropharyngeal phase: Secondary | ICD-10-CM | POA: Diagnosis not present

## 2021-05-02 DIAGNOSIS — S83104A Unspecified dislocation of right knee, initial encounter: Secondary | ICD-10-CM | POA: Diagnosis not present

## 2021-05-02 DIAGNOSIS — U071 COVID-19: Secondary | ICD-10-CM | POA: Diagnosis not present

## 2021-05-02 DIAGNOSIS — R2689 Other abnormalities of gait and mobility: Secondary | ICD-10-CM | POA: Diagnosis not present

## 2021-05-03 DIAGNOSIS — S83104D Unspecified dislocation of right knee, subsequent encounter: Secondary | ICD-10-CM | POA: Diagnosis not present

## 2021-05-03 DIAGNOSIS — R41841 Cognitive communication deficit: Secondary | ICD-10-CM | POA: Diagnosis not present

## 2021-05-03 DIAGNOSIS — R2689 Other abnormalities of gait and mobility: Secondary | ICD-10-CM | POA: Diagnosis not present

## 2021-05-03 DIAGNOSIS — R262 Difficulty in walking, not elsewhere classified: Secondary | ICD-10-CM | POA: Diagnosis not present

## 2021-05-03 DIAGNOSIS — M6259 Muscle wasting and atrophy, not elsewhere classified, multiple sites: Secondary | ICD-10-CM | POA: Diagnosis not present

## 2021-05-03 DIAGNOSIS — R278 Other lack of coordination: Secondary | ICD-10-CM | POA: Diagnosis not present

## 2021-05-03 DIAGNOSIS — R1312 Dysphagia, oropharyngeal phase: Secondary | ICD-10-CM | POA: Diagnosis not present

## 2021-05-03 DIAGNOSIS — S83104A Unspecified dislocation of right knee, initial encounter: Secondary | ICD-10-CM | POA: Diagnosis not present

## 2021-05-03 DIAGNOSIS — U071 COVID-19: Secondary | ICD-10-CM | POA: Diagnosis not present

## 2021-05-04 DIAGNOSIS — R1312 Dysphagia, oropharyngeal phase: Secondary | ICD-10-CM | POA: Diagnosis not present

## 2021-05-04 DIAGNOSIS — R278 Other lack of coordination: Secondary | ICD-10-CM | POA: Diagnosis not present

## 2021-05-04 DIAGNOSIS — M6259 Muscle wasting and atrophy, not elsewhere classified, multiple sites: Secondary | ICD-10-CM | POA: Diagnosis not present

## 2021-05-04 DIAGNOSIS — S83104D Unspecified dislocation of right knee, subsequent encounter: Secondary | ICD-10-CM | POA: Diagnosis not present

## 2021-05-04 DIAGNOSIS — U071 COVID-19: Secondary | ICD-10-CM | POA: Diagnosis not present

## 2021-05-04 DIAGNOSIS — R2689 Other abnormalities of gait and mobility: Secondary | ICD-10-CM | POA: Diagnosis not present

## 2021-05-04 DIAGNOSIS — R262 Difficulty in walking, not elsewhere classified: Secondary | ICD-10-CM | POA: Diagnosis not present

## 2021-05-04 DIAGNOSIS — S83104A Unspecified dislocation of right knee, initial encounter: Secondary | ICD-10-CM | POA: Diagnosis not present

## 2021-05-04 DIAGNOSIS — R41841 Cognitive communication deficit: Secondary | ICD-10-CM | POA: Diagnosis not present

## 2021-05-05 DIAGNOSIS — U071 COVID-19: Secondary | ICD-10-CM | POA: Diagnosis not present

## 2021-05-05 DIAGNOSIS — R262 Difficulty in walking, not elsewhere classified: Secondary | ICD-10-CM | POA: Diagnosis not present

## 2021-05-05 DIAGNOSIS — R278 Other lack of coordination: Secondary | ICD-10-CM | POA: Diagnosis not present

## 2021-05-05 DIAGNOSIS — S83104A Unspecified dislocation of right knee, initial encounter: Secondary | ICD-10-CM | POA: Diagnosis not present

## 2021-05-05 DIAGNOSIS — R41841 Cognitive communication deficit: Secondary | ICD-10-CM | POA: Diagnosis not present

## 2021-05-05 DIAGNOSIS — M6259 Muscle wasting and atrophy, not elsewhere classified, multiple sites: Secondary | ICD-10-CM | POA: Diagnosis not present

## 2021-05-05 DIAGNOSIS — R1312 Dysphagia, oropharyngeal phase: Secondary | ICD-10-CM | POA: Diagnosis not present

## 2021-05-05 DIAGNOSIS — S83104D Unspecified dislocation of right knee, subsequent encounter: Secondary | ICD-10-CM | POA: Diagnosis not present

## 2021-05-05 DIAGNOSIS — R2689 Other abnormalities of gait and mobility: Secondary | ICD-10-CM | POA: Diagnosis not present

## 2021-05-06 DIAGNOSIS — M6259 Muscle wasting and atrophy, not elsewhere classified, multiple sites: Secondary | ICD-10-CM | POA: Diagnosis not present

## 2021-05-06 DIAGNOSIS — S83104D Unspecified dislocation of right knee, subsequent encounter: Secondary | ICD-10-CM | POA: Diagnosis not present

## 2021-05-06 DIAGNOSIS — R41841 Cognitive communication deficit: Secondary | ICD-10-CM | POA: Diagnosis not present

## 2021-05-06 DIAGNOSIS — R2689 Other abnormalities of gait and mobility: Secondary | ICD-10-CM | POA: Diagnosis not present

## 2021-05-06 DIAGNOSIS — R1312 Dysphagia, oropharyngeal phase: Secondary | ICD-10-CM | POA: Diagnosis not present

## 2021-05-06 DIAGNOSIS — U071 COVID-19: Secondary | ICD-10-CM | POA: Diagnosis not present

## 2021-05-06 DIAGNOSIS — R278 Other lack of coordination: Secondary | ICD-10-CM | POA: Diagnosis not present

## 2021-05-06 DIAGNOSIS — S83104A Unspecified dislocation of right knee, initial encounter: Secondary | ICD-10-CM | POA: Diagnosis not present

## 2021-05-06 DIAGNOSIS — R262 Difficulty in walking, not elsewhere classified: Secondary | ICD-10-CM | POA: Diagnosis not present

## 2021-05-09 DIAGNOSIS — S83104D Unspecified dislocation of right knee, subsequent encounter: Secondary | ICD-10-CM | POA: Diagnosis not present

## 2021-05-09 DIAGNOSIS — F200 Paranoid schizophrenia: Secondary | ICD-10-CM | POA: Diagnosis not present

## 2021-05-09 DIAGNOSIS — R4189 Other symptoms and signs involving cognitive functions and awareness: Secondary | ICD-10-CM | POA: Diagnosis not present

## 2021-05-09 DIAGNOSIS — R41841 Cognitive communication deficit: Secondary | ICD-10-CM | POA: Diagnosis not present

## 2021-05-09 DIAGNOSIS — U071 COVID-19: Secondary | ICD-10-CM | POA: Diagnosis not present

## 2021-05-09 DIAGNOSIS — S83104A Unspecified dislocation of right knee, initial encounter: Secondary | ICD-10-CM | POA: Diagnosis not present

## 2021-05-09 DIAGNOSIS — R2689 Other abnormalities of gait and mobility: Secondary | ICD-10-CM | POA: Diagnosis not present

## 2021-05-09 DIAGNOSIS — R278 Other lack of coordination: Secondary | ICD-10-CM | POA: Diagnosis not present

## 2021-05-09 DIAGNOSIS — F3130 Bipolar disorder, current episode depressed, mild or moderate severity, unspecified: Secondary | ICD-10-CM | POA: Diagnosis not present

## 2021-05-09 DIAGNOSIS — R262 Difficulty in walking, not elsewhere classified: Secondary | ICD-10-CM | POA: Diagnosis not present

## 2021-05-09 DIAGNOSIS — R1312 Dysphagia, oropharyngeal phase: Secondary | ICD-10-CM | POA: Diagnosis not present

## 2021-05-09 DIAGNOSIS — M6259 Muscle wasting and atrophy, not elsewhere classified, multiple sites: Secondary | ICD-10-CM | POA: Diagnosis not present

## 2021-05-10 DIAGNOSIS — M6259 Muscle wasting and atrophy, not elsewhere classified, multiple sites: Secondary | ICD-10-CM | POA: Diagnosis not present

## 2021-05-10 DIAGNOSIS — R278 Other lack of coordination: Secondary | ICD-10-CM | POA: Diagnosis not present

## 2021-05-10 DIAGNOSIS — R1312 Dysphagia, oropharyngeal phase: Secondary | ICD-10-CM | POA: Diagnosis not present

## 2021-05-10 DIAGNOSIS — R262 Difficulty in walking, not elsewhere classified: Secondary | ICD-10-CM | POA: Diagnosis not present

## 2021-05-10 DIAGNOSIS — R41841 Cognitive communication deficit: Secondary | ICD-10-CM | POA: Diagnosis not present

## 2021-05-10 DIAGNOSIS — S83104D Unspecified dislocation of right knee, subsequent encounter: Secondary | ICD-10-CM | POA: Diagnosis not present

## 2021-05-10 DIAGNOSIS — U071 COVID-19: Secondary | ICD-10-CM | POA: Diagnosis not present

## 2021-05-10 DIAGNOSIS — S83104A Unspecified dislocation of right knee, initial encounter: Secondary | ICD-10-CM | POA: Diagnosis not present

## 2021-05-10 DIAGNOSIS — R2689 Other abnormalities of gait and mobility: Secondary | ICD-10-CM | POA: Diagnosis not present

## 2021-05-11 DIAGNOSIS — M6259 Muscle wasting and atrophy, not elsewhere classified, multiple sites: Secondary | ICD-10-CM | POA: Diagnosis not present

## 2021-05-11 DIAGNOSIS — R2689 Other abnormalities of gait and mobility: Secondary | ICD-10-CM | POA: Diagnosis not present

## 2021-05-11 DIAGNOSIS — R278 Other lack of coordination: Secondary | ICD-10-CM | POA: Diagnosis not present

## 2021-05-11 DIAGNOSIS — U071 COVID-19: Secondary | ICD-10-CM | POA: Diagnosis not present

## 2021-05-11 DIAGNOSIS — G8194 Hemiplegia, unspecified affecting left nondominant side: Secondary | ICD-10-CM | POA: Diagnosis not present

## 2021-05-11 DIAGNOSIS — R1312 Dysphagia, oropharyngeal phase: Secondary | ICD-10-CM | POA: Diagnosis not present

## 2021-05-11 DIAGNOSIS — M6249 Contracture of muscle, multiple sites: Secondary | ICD-10-CM | POA: Diagnosis not present

## 2021-05-11 DIAGNOSIS — U099 Post covid-19 condition, unspecified: Secondary | ICD-10-CM | POA: Diagnosis not present

## 2021-05-11 DIAGNOSIS — R41841 Cognitive communication deficit: Secondary | ICD-10-CM | POA: Diagnosis not present

## 2021-05-12 DIAGNOSIS — R1312 Dysphagia, oropharyngeal phase: Secondary | ICD-10-CM | POA: Diagnosis not present

## 2021-05-12 DIAGNOSIS — M6249 Contracture of muscle, multiple sites: Secondary | ICD-10-CM | POA: Diagnosis not present

## 2021-05-12 DIAGNOSIS — G8194 Hemiplegia, unspecified affecting left nondominant side: Secondary | ICD-10-CM | POA: Diagnosis not present

## 2021-05-12 DIAGNOSIS — R278 Other lack of coordination: Secondary | ICD-10-CM | POA: Diagnosis not present

## 2021-05-12 DIAGNOSIS — R41841 Cognitive communication deficit: Secondary | ICD-10-CM | POA: Diagnosis not present

## 2021-05-12 DIAGNOSIS — I679 Cerebrovascular disease, unspecified: Secondary | ICD-10-CM | POA: Diagnosis not present

## 2021-05-12 DIAGNOSIS — M6259 Muscle wasting and atrophy, not elsewhere classified, multiple sites: Secondary | ICD-10-CM | POA: Diagnosis not present

## 2021-05-12 DIAGNOSIS — F0150 Vascular dementia without behavioral disturbance: Secondary | ICD-10-CM | POA: Diagnosis not present

## 2021-05-12 DIAGNOSIS — U099 Post covid-19 condition, unspecified: Secondary | ICD-10-CM | POA: Diagnosis not present

## 2021-05-12 DIAGNOSIS — I10 Essential (primary) hypertension: Secondary | ICD-10-CM | POA: Diagnosis not present

## 2021-05-12 DIAGNOSIS — E785 Hyperlipidemia, unspecified: Secondary | ICD-10-CM | POA: Diagnosis not present

## 2021-05-12 DIAGNOSIS — R2689 Other abnormalities of gait and mobility: Secondary | ICD-10-CM | POA: Diagnosis not present

## 2021-05-12 DIAGNOSIS — D649 Anemia, unspecified: Secondary | ICD-10-CM | POA: Diagnosis not present

## 2021-05-12 DIAGNOSIS — U071 COVID-19: Secondary | ICD-10-CM | POA: Diagnosis not present

## 2021-05-13 DIAGNOSIS — M6249 Contracture of muscle, multiple sites: Secondary | ICD-10-CM | POA: Diagnosis not present

## 2021-05-13 DIAGNOSIS — G8194 Hemiplegia, unspecified affecting left nondominant side: Secondary | ICD-10-CM | POA: Diagnosis not present

## 2021-05-13 DIAGNOSIS — R1312 Dysphagia, oropharyngeal phase: Secondary | ICD-10-CM | POA: Diagnosis not present

## 2021-05-13 DIAGNOSIS — R278 Other lack of coordination: Secondary | ICD-10-CM | POA: Diagnosis not present

## 2021-05-13 DIAGNOSIS — R41841 Cognitive communication deficit: Secondary | ICD-10-CM | POA: Diagnosis not present

## 2021-05-13 DIAGNOSIS — M6259 Muscle wasting and atrophy, not elsewhere classified, multiple sites: Secondary | ICD-10-CM | POA: Diagnosis not present

## 2021-05-13 DIAGNOSIS — U071 COVID-19: Secondary | ICD-10-CM | POA: Diagnosis not present

## 2021-05-13 DIAGNOSIS — U099 Post covid-19 condition, unspecified: Secondary | ICD-10-CM | POA: Diagnosis not present

## 2021-05-13 DIAGNOSIS — R2689 Other abnormalities of gait and mobility: Secondary | ICD-10-CM | POA: Diagnosis not present

## 2021-05-14 DIAGNOSIS — M6259 Muscle wasting and atrophy, not elsewhere classified, multiple sites: Secondary | ICD-10-CM | POA: Diagnosis not present

## 2021-05-14 DIAGNOSIS — U099 Post covid-19 condition, unspecified: Secondary | ICD-10-CM | POA: Diagnosis not present

## 2021-05-14 DIAGNOSIS — I10 Essential (primary) hypertension: Secondary | ICD-10-CM | POA: Diagnosis not present

## 2021-05-14 DIAGNOSIS — G8194 Hemiplegia, unspecified affecting left nondominant side: Secondary | ICD-10-CM | POA: Diagnosis not present

## 2021-05-14 DIAGNOSIS — G9340 Encephalopathy, unspecified: Secondary | ICD-10-CM | POA: Diagnosis not present

## 2021-05-14 DIAGNOSIS — U071 COVID-19: Secondary | ICD-10-CM | POA: Diagnosis not present

## 2021-05-14 DIAGNOSIS — S83104D Unspecified dislocation of right knee, subsequent encounter: Secondary | ICD-10-CM | POA: Diagnosis not present

## 2021-05-14 DIAGNOSIS — R41841 Cognitive communication deficit: Secondary | ICD-10-CM | POA: Diagnosis not present

## 2021-05-14 DIAGNOSIS — M6249 Contracture of muscle, multiple sites: Secondary | ICD-10-CM | POA: Diagnosis not present

## 2021-05-14 DIAGNOSIS — R2689 Other abnormalities of gait and mobility: Secondary | ICD-10-CM | POA: Diagnosis not present

## 2021-05-14 DIAGNOSIS — R1312 Dysphagia, oropharyngeal phase: Secondary | ICD-10-CM | POA: Diagnosis not present

## 2021-05-14 DIAGNOSIS — R278 Other lack of coordination: Secondary | ICD-10-CM | POA: Diagnosis not present

## 2021-05-14 DIAGNOSIS — E559 Vitamin D deficiency, unspecified: Secondary | ICD-10-CM | POA: Diagnosis not present

## 2021-05-14 DIAGNOSIS — D519 Vitamin B12 deficiency anemia, unspecified: Secondary | ICD-10-CM | POA: Diagnosis not present

## 2021-05-16 DIAGNOSIS — R41841 Cognitive communication deficit: Secondary | ICD-10-CM | POA: Diagnosis not present

## 2021-05-16 DIAGNOSIS — U099 Post covid-19 condition, unspecified: Secondary | ICD-10-CM | POA: Diagnosis not present

## 2021-05-16 DIAGNOSIS — U071 COVID-19: Secondary | ICD-10-CM | POA: Diagnosis not present

## 2021-05-16 DIAGNOSIS — G8194 Hemiplegia, unspecified affecting left nondominant side: Secondary | ICD-10-CM | POA: Diagnosis not present

## 2021-05-16 DIAGNOSIS — M6249 Contracture of muscle, multiple sites: Secondary | ICD-10-CM | POA: Diagnosis not present

## 2021-05-16 DIAGNOSIS — M6259 Muscle wasting and atrophy, not elsewhere classified, multiple sites: Secondary | ICD-10-CM | POA: Diagnosis not present

## 2021-05-16 DIAGNOSIS — R2689 Other abnormalities of gait and mobility: Secondary | ICD-10-CM | POA: Diagnosis not present

## 2021-05-16 DIAGNOSIS — R1312 Dysphagia, oropharyngeal phase: Secondary | ICD-10-CM | POA: Diagnosis not present

## 2021-05-16 DIAGNOSIS — R278 Other lack of coordination: Secondary | ICD-10-CM | POA: Diagnosis not present

## 2021-05-17 DIAGNOSIS — U099 Post covid-19 condition, unspecified: Secondary | ICD-10-CM | POA: Diagnosis not present

## 2021-05-17 DIAGNOSIS — R278 Other lack of coordination: Secondary | ICD-10-CM | POA: Diagnosis not present

## 2021-05-17 DIAGNOSIS — U071 COVID-19: Secondary | ICD-10-CM | POA: Diagnosis not present

## 2021-05-17 DIAGNOSIS — M6259 Muscle wasting and atrophy, not elsewhere classified, multiple sites: Secondary | ICD-10-CM | POA: Diagnosis not present

## 2021-05-17 DIAGNOSIS — M6249 Contracture of muscle, multiple sites: Secondary | ICD-10-CM | POA: Diagnosis not present

## 2021-05-17 DIAGNOSIS — R41841 Cognitive communication deficit: Secondary | ICD-10-CM | POA: Diagnosis not present

## 2021-05-17 DIAGNOSIS — R2689 Other abnormalities of gait and mobility: Secondary | ICD-10-CM | POA: Diagnosis not present

## 2021-05-17 DIAGNOSIS — R1312 Dysphagia, oropharyngeal phase: Secondary | ICD-10-CM | POA: Diagnosis not present

## 2021-05-17 DIAGNOSIS — G8194 Hemiplegia, unspecified affecting left nondominant side: Secondary | ICD-10-CM | POA: Diagnosis not present

## 2021-05-18 DIAGNOSIS — R41841 Cognitive communication deficit: Secondary | ICD-10-CM | POA: Diagnosis not present

## 2021-05-18 DIAGNOSIS — R1312 Dysphagia, oropharyngeal phase: Secondary | ICD-10-CM | POA: Diagnosis not present

## 2021-05-18 DIAGNOSIS — R2689 Other abnormalities of gait and mobility: Secondary | ICD-10-CM | POA: Diagnosis not present

## 2021-05-18 DIAGNOSIS — U071 COVID-19: Secondary | ICD-10-CM | POA: Diagnosis not present

## 2021-05-18 DIAGNOSIS — U099 Post covid-19 condition, unspecified: Secondary | ICD-10-CM | POA: Diagnosis not present

## 2021-05-18 DIAGNOSIS — R278 Other lack of coordination: Secondary | ICD-10-CM | POA: Diagnosis not present

## 2021-05-18 DIAGNOSIS — G8194 Hemiplegia, unspecified affecting left nondominant side: Secondary | ICD-10-CM | POA: Diagnosis not present

## 2021-05-18 DIAGNOSIS — M6259 Muscle wasting and atrophy, not elsewhere classified, multiple sites: Secondary | ICD-10-CM | POA: Diagnosis not present

## 2021-05-18 DIAGNOSIS — M6249 Contracture of muscle, multiple sites: Secondary | ICD-10-CM | POA: Diagnosis not present

## 2021-05-19 DIAGNOSIS — R278 Other lack of coordination: Secondary | ICD-10-CM | POA: Diagnosis not present

## 2021-05-19 DIAGNOSIS — R1312 Dysphagia, oropharyngeal phase: Secondary | ICD-10-CM | POA: Diagnosis not present

## 2021-05-19 DIAGNOSIS — R41841 Cognitive communication deficit: Secondary | ICD-10-CM | POA: Diagnosis not present

## 2021-05-19 DIAGNOSIS — M6249 Contracture of muscle, multiple sites: Secondary | ICD-10-CM | POA: Diagnosis not present

## 2021-05-19 DIAGNOSIS — U071 COVID-19: Secondary | ICD-10-CM | POA: Diagnosis not present

## 2021-05-19 DIAGNOSIS — U099 Post covid-19 condition, unspecified: Secondary | ICD-10-CM | POA: Diagnosis not present

## 2021-05-19 DIAGNOSIS — M6259 Muscle wasting and atrophy, not elsewhere classified, multiple sites: Secondary | ICD-10-CM | POA: Diagnosis not present

## 2021-05-19 DIAGNOSIS — G8194 Hemiplegia, unspecified affecting left nondominant side: Secondary | ICD-10-CM | POA: Diagnosis not present

## 2021-05-19 DIAGNOSIS — R2689 Other abnormalities of gait and mobility: Secondary | ICD-10-CM | POA: Diagnosis not present

## 2021-05-20 DIAGNOSIS — G8194 Hemiplegia, unspecified affecting left nondominant side: Secondary | ICD-10-CM | POA: Diagnosis not present

## 2021-05-20 DIAGNOSIS — R2689 Other abnormalities of gait and mobility: Secondary | ICD-10-CM | POA: Diagnosis not present

## 2021-05-20 DIAGNOSIS — M6259 Muscle wasting and atrophy, not elsewhere classified, multiple sites: Secondary | ICD-10-CM | POA: Diagnosis not present

## 2021-05-20 DIAGNOSIS — U099 Post covid-19 condition, unspecified: Secondary | ICD-10-CM | POA: Diagnosis not present

## 2021-05-20 DIAGNOSIS — U071 COVID-19: Secondary | ICD-10-CM | POA: Diagnosis not present

## 2021-05-20 DIAGNOSIS — R41841 Cognitive communication deficit: Secondary | ICD-10-CM | POA: Diagnosis not present

## 2021-05-20 DIAGNOSIS — R1312 Dysphagia, oropharyngeal phase: Secondary | ICD-10-CM | POA: Diagnosis not present

## 2021-05-20 DIAGNOSIS — R278 Other lack of coordination: Secondary | ICD-10-CM | POA: Diagnosis not present

## 2021-05-20 DIAGNOSIS — M6249 Contracture of muscle, multiple sites: Secondary | ICD-10-CM | POA: Diagnosis not present

## 2021-05-23 DIAGNOSIS — R2689 Other abnormalities of gait and mobility: Secondary | ICD-10-CM | POA: Diagnosis not present

## 2021-05-23 DIAGNOSIS — R41841 Cognitive communication deficit: Secondary | ICD-10-CM | POA: Diagnosis not present

## 2021-05-23 DIAGNOSIS — R278 Other lack of coordination: Secondary | ICD-10-CM | POA: Diagnosis not present

## 2021-05-23 DIAGNOSIS — M6249 Contracture of muscle, multiple sites: Secondary | ICD-10-CM | POA: Diagnosis not present

## 2021-05-23 DIAGNOSIS — U099 Post covid-19 condition, unspecified: Secondary | ICD-10-CM | POA: Diagnosis not present

## 2021-05-23 DIAGNOSIS — R1312 Dysphagia, oropharyngeal phase: Secondary | ICD-10-CM | POA: Diagnosis not present

## 2021-05-23 DIAGNOSIS — M6259 Muscle wasting and atrophy, not elsewhere classified, multiple sites: Secondary | ICD-10-CM | POA: Diagnosis not present

## 2021-05-23 DIAGNOSIS — U071 COVID-19: Secondary | ICD-10-CM | POA: Diagnosis not present

## 2021-05-23 DIAGNOSIS — G8194 Hemiplegia, unspecified affecting left nondominant side: Secondary | ICD-10-CM | POA: Diagnosis not present

## 2021-05-24 DIAGNOSIS — R1312 Dysphagia, oropharyngeal phase: Secondary | ICD-10-CM | POA: Diagnosis not present

## 2021-05-24 DIAGNOSIS — M6259 Muscle wasting and atrophy, not elsewhere classified, multiple sites: Secondary | ICD-10-CM | POA: Diagnosis not present

## 2021-05-24 DIAGNOSIS — U071 COVID-19: Secondary | ICD-10-CM | POA: Diagnosis not present

## 2021-05-24 DIAGNOSIS — R278 Other lack of coordination: Secondary | ICD-10-CM | POA: Diagnosis not present

## 2021-05-24 DIAGNOSIS — R2689 Other abnormalities of gait and mobility: Secondary | ICD-10-CM | POA: Diagnosis not present

## 2021-05-24 DIAGNOSIS — U099 Post covid-19 condition, unspecified: Secondary | ICD-10-CM | POA: Diagnosis not present

## 2021-05-24 DIAGNOSIS — G8194 Hemiplegia, unspecified affecting left nondominant side: Secondary | ICD-10-CM | POA: Diagnosis not present

## 2021-05-24 DIAGNOSIS — M6249 Contracture of muscle, multiple sites: Secondary | ICD-10-CM | POA: Diagnosis not present

## 2021-05-24 DIAGNOSIS — R41841 Cognitive communication deficit: Secondary | ICD-10-CM | POA: Diagnosis not present

## 2021-05-25 DIAGNOSIS — U071 COVID-19: Secondary | ICD-10-CM | POA: Diagnosis not present

## 2021-05-25 DIAGNOSIS — M6259 Muscle wasting and atrophy, not elsewhere classified, multiple sites: Secondary | ICD-10-CM | POA: Diagnosis not present

## 2021-05-25 DIAGNOSIS — R2689 Other abnormalities of gait and mobility: Secondary | ICD-10-CM | POA: Diagnosis not present

## 2021-05-25 DIAGNOSIS — R278 Other lack of coordination: Secondary | ICD-10-CM | POA: Diagnosis not present

## 2021-05-25 DIAGNOSIS — M6249 Contracture of muscle, multiple sites: Secondary | ICD-10-CM | POA: Diagnosis not present

## 2021-05-25 DIAGNOSIS — U099 Post covid-19 condition, unspecified: Secondary | ICD-10-CM | POA: Diagnosis not present

## 2021-05-25 DIAGNOSIS — R1312 Dysphagia, oropharyngeal phase: Secondary | ICD-10-CM | POA: Diagnosis not present

## 2021-05-25 DIAGNOSIS — G8194 Hemiplegia, unspecified affecting left nondominant side: Secondary | ICD-10-CM | POA: Diagnosis not present

## 2021-05-25 DIAGNOSIS — R41841 Cognitive communication deficit: Secondary | ICD-10-CM | POA: Diagnosis not present

## 2021-05-26 DIAGNOSIS — R41841 Cognitive communication deficit: Secondary | ICD-10-CM | POA: Diagnosis not present

## 2021-05-26 DIAGNOSIS — G8194 Hemiplegia, unspecified affecting left nondominant side: Secondary | ICD-10-CM | POA: Diagnosis not present

## 2021-05-26 DIAGNOSIS — M6259 Muscle wasting and atrophy, not elsewhere classified, multiple sites: Secondary | ICD-10-CM | POA: Diagnosis not present

## 2021-05-26 DIAGNOSIS — U071 COVID-19: Secondary | ICD-10-CM | POA: Diagnosis not present

## 2021-05-26 DIAGNOSIS — R2689 Other abnormalities of gait and mobility: Secondary | ICD-10-CM | POA: Diagnosis not present

## 2021-05-26 DIAGNOSIS — U099 Post covid-19 condition, unspecified: Secondary | ICD-10-CM | POA: Diagnosis not present

## 2021-05-26 DIAGNOSIS — R1312 Dysphagia, oropharyngeal phase: Secondary | ICD-10-CM | POA: Diagnosis not present

## 2021-05-26 DIAGNOSIS — R278 Other lack of coordination: Secondary | ICD-10-CM | POA: Diagnosis not present

## 2021-05-26 DIAGNOSIS — M6249 Contracture of muscle, multiple sites: Secondary | ICD-10-CM | POA: Diagnosis not present

## 2021-05-28 DIAGNOSIS — R41841 Cognitive communication deficit: Secondary | ICD-10-CM | POA: Diagnosis not present

## 2021-05-28 DIAGNOSIS — U099 Post covid-19 condition, unspecified: Secondary | ICD-10-CM | POA: Diagnosis not present

## 2021-05-28 DIAGNOSIS — M6249 Contracture of muscle, multiple sites: Secondary | ICD-10-CM | POA: Diagnosis not present

## 2021-05-28 DIAGNOSIS — R278 Other lack of coordination: Secondary | ICD-10-CM | POA: Diagnosis not present

## 2021-05-28 DIAGNOSIS — U071 COVID-19: Secondary | ICD-10-CM | POA: Diagnosis not present

## 2021-05-28 DIAGNOSIS — G8194 Hemiplegia, unspecified affecting left nondominant side: Secondary | ICD-10-CM | POA: Diagnosis not present

## 2021-05-28 DIAGNOSIS — R2689 Other abnormalities of gait and mobility: Secondary | ICD-10-CM | POA: Diagnosis not present

## 2021-05-28 DIAGNOSIS — R300 Dysuria: Secondary | ICD-10-CM | POA: Diagnosis not present

## 2021-05-28 DIAGNOSIS — R1312 Dysphagia, oropharyngeal phase: Secondary | ICD-10-CM | POA: Diagnosis not present

## 2021-05-28 DIAGNOSIS — M6259 Muscle wasting and atrophy, not elsewhere classified, multiple sites: Secondary | ICD-10-CM | POA: Diagnosis not present

## 2021-05-29 DIAGNOSIS — G8194 Hemiplegia, unspecified affecting left nondominant side: Secondary | ICD-10-CM | POA: Diagnosis not present

## 2021-05-29 DIAGNOSIS — M6249 Contracture of muscle, multiple sites: Secondary | ICD-10-CM | POA: Diagnosis not present

## 2021-05-29 DIAGNOSIS — R41841 Cognitive communication deficit: Secondary | ICD-10-CM | POA: Diagnosis not present

## 2021-05-29 DIAGNOSIS — U071 COVID-19: Secondary | ICD-10-CM | POA: Diagnosis not present

## 2021-05-29 DIAGNOSIS — U099 Post covid-19 condition, unspecified: Secondary | ICD-10-CM | POA: Diagnosis not present

## 2021-05-29 DIAGNOSIS — R278 Other lack of coordination: Secondary | ICD-10-CM | POA: Diagnosis not present

## 2021-05-29 DIAGNOSIS — R2689 Other abnormalities of gait and mobility: Secondary | ICD-10-CM | POA: Diagnosis not present

## 2021-05-29 DIAGNOSIS — M6259 Muscle wasting and atrophy, not elsewhere classified, multiple sites: Secondary | ICD-10-CM | POA: Diagnosis not present

## 2021-05-29 DIAGNOSIS — R1312 Dysphagia, oropharyngeal phase: Secondary | ICD-10-CM | POA: Diagnosis not present

## 2021-05-30 DIAGNOSIS — M6259 Muscle wasting and atrophy, not elsewhere classified, multiple sites: Secondary | ICD-10-CM | POA: Diagnosis not present

## 2021-05-30 DIAGNOSIS — R278 Other lack of coordination: Secondary | ICD-10-CM | POA: Diagnosis not present

## 2021-05-30 DIAGNOSIS — R1312 Dysphagia, oropharyngeal phase: Secondary | ICD-10-CM | POA: Diagnosis not present

## 2021-05-30 DIAGNOSIS — U099 Post covid-19 condition, unspecified: Secondary | ICD-10-CM | POA: Diagnosis not present

## 2021-05-30 DIAGNOSIS — R41841 Cognitive communication deficit: Secondary | ICD-10-CM | POA: Diagnosis not present

## 2021-05-30 DIAGNOSIS — R2689 Other abnormalities of gait and mobility: Secondary | ICD-10-CM | POA: Diagnosis not present

## 2021-05-30 DIAGNOSIS — G8194 Hemiplegia, unspecified affecting left nondominant side: Secondary | ICD-10-CM | POA: Diagnosis not present

## 2021-05-30 DIAGNOSIS — U071 COVID-19: Secondary | ICD-10-CM | POA: Diagnosis not present

## 2021-05-30 DIAGNOSIS — M6249 Contracture of muscle, multiple sites: Secondary | ICD-10-CM | POA: Diagnosis not present

## 2021-05-31 DIAGNOSIS — R1312 Dysphagia, oropharyngeal phase: Secondary | ICD-10-CM | POA: Diagnosis not present

## 2021-05-31 DIAGNOSIS — U099 Post covid-19 condition, unspecified: Secondary | ICD-10-CM | POA: Diagnosis not present

## 2021-05-31 DIAGNOSIS — R41841 Cognitive communication deficit: Secondary | ICD-10-CM | POA: Diagnosis not present

## 2021-05-31 DIAGNOSIS — M6249 Contracture of muscle, multiple sites: Secondary | ICD-10-CM | POA: Diagnosis not present

## 2021-05-31 DIAGNOSIS — R278 Other lack of coordination: Secondary | ICD-10-CM | POA: Diagnosis not present

## 2021-05-31 DIAGNOSIS — R2689 Other abnormalities of gait and mobility: Secondary | ICD-10-CM | POA: Diagnosis not present

## 2021-05-31 DIAGNOSIS — G8194 Hemiplegia, unspecified affecting left nondominant side: Secondary | ICD-10-CM | POA: Diagnosis not present

## 2021-05-31 DIAGNOSIS — M6259 Muscle wasting and atrophy, not elsewhere classified, multiple sites: Secondary | ICD-10-CM | POA: Diagnosis not present

## 2021-05-31 DIAGNOSIS — U071 COVID-19: Secondary | ICD-10-CM | POA: Diagnosis not present

## 2021-06-01 DIAGNOSIS — R2689 Other abnormalities of gait and mobility: Secondary | ICD-10-CM | POA: Diagnosis not present

## 2021-06-01 DIAGNOSIS — R41841 Cognitive communication deficit: Secondary | ICD-10-CM | POA: Diagnosis not present

## 2021-06-01 DIAGNOSIS — R1312 Dysphagia, oropharyngeal phase: Secondary | ICD-10-CM | POA: Diagnosis not present

## 2021-06-01 DIAGNOSIS — R278 Other lack of coordination: Secondary | ICD-10-CM | POA: Diagnosis not present

## 2021-06-01 DIAGNOSIS — M6249 Contracture of muscle, multiple sites: Secondary | ICD-10-CM | POA: Diagnosis not present

## 2021-06-01 DIAGNOSIS — G8194 Hemiplegia, unspecified affecting left nondominant side: Secondary | ICD-10-CM | POA: Diagnosis not present

## 2021-06-01 DIAGNOSIS — M6259 Muscle wasting and atrophy, not elsewhere classified, multiple sites: Secondary | ICD-10-CM | POA: Diagnosis not present

## 2021-06-01 DIAGNOSIS — U071 COVID-19: Secondary | ICD-10-CM | POA: Diagnosis not present

## 2021-06-01 DIAGNOSIS — U099 Post covid-19 condition, unspecified: Secondary | ICD-10-CM | POA: Diagnosis not present

## 2021-06-02 DIAGNOSIS — R41841 Cognitive communication deficit: Secondary | ICD-10-CM | POA: Diagnosis not present

## 2021-06-02 DIAGNOSIS — R2689 Other abnormalities of gait and mobility: Secondary | ICD-10-CM | POA: Diagnosis not present

## 2021-06-02 DIAGNOSIS — U071 COVID-19: Secondary | ICD-10-CM | POA: Diagnosis not present

## 2021-06-02 DIAGNOSIS — R1312 Dysphagia, oropharyngeal phase: Secondary | ICD-10-CM | POA: Diagnosis not present

## 2021-06-02 DIAGNOSIS — G8194 Hemiplegia, unspecified affecting left nondominant side: Secondary | ICD-10-CM | POA: Diagnosis not present

## 2021-06-02 DIAGNOSIS — M6259 Muscle wasting and atrophy, not elsewhere classified, multiple sites: Secondary | ICD-10-CM | POA: Diagnosis not present

## 2021-06-02 DIAGNOSIS — R278 Other lack of coordination: Secondary | ICD-10-CM | POA: Diagnosis not present

## 2021-06-02 DIAGNOSIS — U099 Post covid-19 condition, unspecified: Secondary | ICD-10-CM | POA: Diagnosis not present

## 2021-06-02 DIAGNOSIS — M6249 Contracture of muscle, multiple sites: Secondary | ICD-10-CM | POA: Diagnosis not present

## 2021-06-03 DIAGNOSIS — R278 Other lack of coordination: Secondary | ICD-10-CM | POA: Diagnosis not present

## 2021-06-03 DIAGNOSIS — R41841 Cognitive communication deficit: Secondary | ICD-10-CM | POA: Diagnosis not present

## 2021-06-03 DIAGNOSIS — U099 Post covid-19 condition, unspecified: Secondary | ICD-10-CM | POA: Diagnosis not present

## 2021-06-03 DIAGNOSIS — R1312 Dysphagia, oropharyngeal phase: Secondary | ICD-10-CM | POA: Diagnosis not present

## 2021-06-03 DIAGNOSIS — G8194 Hemiplegia, unspecified affecting left nondominant side: Secondary | ICD-10-CM | POA: Diagnosis not present

## 2021-06-03 DIAGNOSIS — R2689 Other abnormalities of gait and mobility: Secondary | ICD-10-CM | POA: Diagnosis not present

## 2021-06-03 DIAGNOSIS — M6259 Muscle wasting and atrophy, not elsewhere classified, multiple sites: Secondary | ICD-10-CM | POA: Diagnosis not present

## 2021-06-03 DIAGNOSIS — M6249 Contracture of muscle, multiple sites: Secondary | ICD-10-CM | POA: Diagnosis not present

## 2021-06-03 DIAGNOSIS — U071 COVID-19: Secondary | ICD-10-CM | POA: Diagnosis not present

## 2021-06-07 DIAGNOSIS — M6259 Muscle wasting and atrophy, not elsewhere classified, multiple sites: Secondary | ICD-10-CM | POA: Diagnosis not present

## 2021-06-07 DIAGNOSIS — U099 Post covid-19 condition, unspecified: Secondary | ICD-10-CM | POA: Diagnosis not present

## 2021-06-07 DIAGNOSIS — R2689 Other abnormalities of gait and mobility: Secondary | ICD-10-CM | POA: Diagnosis not present

## 2021-06-07 DIAGNOSIS — R1312 Dysphagia, oropharyngeal phase: Secondary | ICD-10-CM | POA: Diagnosis not present

## 2021-06-07 DIAGNOSIS — R41841 Cognitive communication deficit: Secondary | ICD-10-CM | POA: Diagnosis not present

## 2021-06-07 DIAGNOSIS — R278 Other lack of coordination: Secondary | ICD-10-CM | POA: Diagnosis not present

## 2021-06-07 DIAGNOSIS — G8194 Hemiplegia, unspecified affecting left nondominant side: Secondary | ICD-10-CM | POA: Diagnosis not present

## 2021-06-07 DIAGNOSIS — M6249 Contracture of muscle, multiple sites: Secondary | ICD-10-CM | POA: Diagnosis not present

## 2021-06-07 DIAGNOSIS — U071 COVID-19: Secondary | ICD-10-CM | POA: Diagnosis not present

## 2021-06-08 DIAGNOSIS — U099 Post covid-19 condition, unspecified: Secondary | ICD-10-CM | POA: Diagnosis not present

## 2021-06-08 DIAGNOSIS — R41841 Cognitive communication deficit: Secondary | ICD-10-CM | POA: Diagnosis not present

## 2021-06-08 DIAGNOSIS — R1312 Dysphagia, oropharyngeal phase: Secondary | ICD-10-CM | POA: Diagnosis not present

## 2021-06-08 DIAGNOSIS — M6259 Muscle wasting and atrophy, not elsewhere classified, multiple sites: Secondary | ICD-10-CM | POA: Diagnosis not present

## 2021-06-08 DIAGNOSIS — R278 Other lack of coordination: Secondary | ICD-10-CM | POA: Diagnosis not present

## 2021-06-08 DIAGNOSIS — R2689 Other abnormalities of gait and mobility: Secondary | ICD-10-CM | POA: Diagnosis not present

## 2021-06-08 DIAGNOSIS — U071 COVID-19: Secondary | ICD-10-CM | POA: Diagnosis not present

## 2021-06-08 DIAGNOSIS — G8194 Hemiplegia, unspecified affecting left nondominant side: Secondary | ICD-10-CM | POA: Diagnosis not present

## 2021-06-08 DIAGNOSIS — M6249 Contracture of muscle, multiple sites: Secondary | ICD-10-CM | POA: Diagnosis not present

## 2021-06-13 DIAGNOSIS — G8194 Hemiplegia, unspecified affecting left nondominant side: Secondary | ICD-10-CM | POA: Diagnosis not present

## 2021-06-13 DIAGNOSIS — R2689 Other abnormalities of gait and mobility: Secondary | ICD-10-CM | POA: Diagnosis not present

## 2021-06-13 DIAGNOSIS — M6249 Contracture of muscle, multiple sites: Secondary | ICD-10-CM | POA: Diagnosis not present

## 2021-06-14 DIAGNOSIS — R2689 Other abnormalities of gait and mobility: Secondary | ICD-10-CM | POA: Diagnosis not present

## 2021-06-14 DIAGNOSIS — M6249 Contracture of muscle, multiple sites: Secondary | ICD-10-CM | POA: Diagnosis not present

## 2021-06-14 DIAGNOSIS — G8194 Hemiplegia, unspecified affecting left nondominant side: Secondary | ICD-10-CM | POA: Diagnosis not present

## 2021-06-15 DIAGNOSIS — M6249 Contracture of muscle, multiple sites: Secondary | ICD-10-CM | POA: Diagnosis not present

## 2021-06-15 DIAGNOSIS — R2689 Other abnormalities of gait and mobility: Secondary | ICD-10-CM | POA: Diagnosis not present

## 2021-06-15 DIAGNOSIS — G8194 Hemiplegia, unspecified affecting left nondominant side: Secondary | ICD-10-CM | POA: Diagnosis not present

## 2021-06-16 DIAGNOSIS — R2689 Other abnormalities of gait and mobility: Secondary | ICD-10-CM | POA: Diagnosis not present

## 2021-06-16 DIAGNOSIS — G8194 Hemiplegia, unspecified affecting left nondominant side: Secondary | ICD-10-CM | POA: Diagnosis not present

## 2021-06-16 DIAGNOSIS — M6249 Contracture of muscle, multiple sites: Secondary | ICD-10-CM | POA: Diagnosis not present

## 2021-06-17 DIAGNOSIS — G8194 Hemiplegia, unspecified affecting left nondominant side: Secondary | ICD-10-CM | POA: Diagnosis not present

## 2021-06-17 DIAGNOSIS — M6249 Contracture of muscle, multiple sites: Secondary | ICD-10-CM | POA: Diagnosis not present

## 2021-06-17 DIAGNOSIS — R2689 Other abnormalities of gait and mobility: Secondary | ICD-10-CM | POA: Diagnosis not present

## 2021-06-20 DIAGNOSIS — M6249 Contracture of muscle, multiple sites: Secondary | ICD-10-CM | POA: Diagnosis not present

## 2021-06-20 DIAGNOSIS — R2689 Other abnormalities of gait and mobility: Secondary | ICD-10-CM | POA: Diagnosis not present

## 2021-06-20 DIAGNOSIS — G8194 Hemiplegia, unspecified affecting left nondominant side: Secondary | ICD-10-CM | POA: Diagnosis not present

## 2021-06-21 DIAGNOSIS — M6249 Contracture of muscle, multiple sites: Secondary | ICD-10-CM | POA: Diagnosis not present

## 2021-06-21 DIAGNOSIS — G8194 Hemiplegia, unspecified affecting left nondominant side: Secondary | ICD-10-CM | POA: Diagnosis not present

## 2021-06-21 DIAGNOSIS — R2689 Other abnormalities of gait and mobility: Secondary | ICD-10-CM | POA: Diagnosis not present

## 2021-06-22 DIAGNOSIS — R2689 Other abnormalities of gait and mobility: Secondary | ICD-10-CM | POA: Diagnosis not present

## 2021-06-22 DIAGNOSIS — M6249 Contracture of muscle, multiple sites: Secondary | ICD-10-CM | POA: Diagnosis not present

## 2021-06-22 DIAGNOSIS — G8194 Hemiplegia, unspecified affecting left nondominant side: Secondary | ICD-10-CM | POA: Diagnosis not present

## 2021-06-23 DIAGNOSIS — R2689 Other abnormalities of gait and mobility: Secondary | ICD-10-CM | POA: Diagnosis not present

## 2021-06-23 DIAGNOSIS — M6249 Contracture of muscle, multiple sites: Secondary | ICD-10-CM | POA: Diagnosis not present

## 2021-06-23 DIAGNOSIS — G8194 Hemiplegia, unspecified affecting left nondominant side: Secondary | ICD-10-CM | POA: Diagnosis not present

## 2021-06-24 DIAGNOSIS — G8194 Hemiplegia, unspecified affecting left nondominant side: Secondary | ICD-10-CM | POA: Diagnosis not present

## 2021-06-24 DIAGNOSIS — R2689 Other abnormalities of gait and mobility: Secondary | ICD-10-CM | POA: Diagnosis not present

## 2021-06-24 DIAGNOSIS — M6249 Contracture of muscle, multiple sites: Secondary | ICD-10-CM | POA: Diagnosis not present

## 2021-06-24 DIAGNOSIS — M24562 Contracture, left knee: Secondary | ICD-10-CM | POA: Diagnosis not present

## 2021-06-24 DIAGNOSIS — M24561 Contracture, right knee: Secondary | ICD-10-CM | POA: Diagnosis not present

## 2021-06-27 DIAGNOSIS — G8194 Hemiplegia, unspecified affecting left nondominant side: Secondary | ICD-10-CM | POA: Diagnosis not present

## 2021-06-27 DIAGNOSIS — M6249 Contracture of muscle, multiple sites: Secondary | ICD-10-CM | POA: Diagnosis not present

## 2021-06-27 DIAGNOSIS — R2689 Other abnormalities of gait and mobility: Secondary | ICD-10-CM | POA: Diagnosis not present

## 2021-06-28 DIAGNOSIS — I69359 Hemiplegia and hemiparesis following cerebral infarction affecting unspecified side: Secondary | ICD-10-CM | POA: Diagnosis not present

## 2021-06-28 DIAGNOSIS — E785 Hyperlipidemia, unspecified: Secondary | ICD-10-CM | POA: Diagnosis not present

## 2021-06-28 DIAGNOSIS — M6249 Contracture of muscle, multiple sites: Secondary | ICD-10-CM | POA: Diagnosis not present

## 2021-06-28 DIAGNOSIS — F39 Unspecified mood [affective] disorder: Secondary | ICD-10-CM | POA: Diagnosis not present

## 2021-06-28 DIAGNOSIS — R682 Dry mouth, unspecified: Secondary | ICD-10-CM | POA: Diagnosis not present

## 2021-06-28 DIAGNOSIS — R2689 Other abnormalities of gait and mobility: Secondary | ICD-10-CM | POA: Diagnosis not present

## 2021-06-28 DIAGNOSIS — Z711 Person with feared health complaint in whom no diagnosis is made: Secondary | ICD-10-CM | POA: Diagnosis not present

## 2021-06-28 DIAGNOSIS — R7981 Abnormal blood-gas level: Secondary | ICD-10-CM | POA: Diagnosis not present

## 2021-06-28 DIAGNOSIS — F22 Delusional disorders: Secondary | ICD-10-CM | POA: Diagnosis not present

## 2021-06-28 DIAGNOSIS — R519 Headache, unspecified: Secondary | ICD-10-CM | POA: Diagnosis not present

## 2021-06-28 DIAGNOSIS — D649 Anemia, unspecified: Secondary | ICD-10-CM | POA: Diagnosis not present

## 2021-06-28 DIAGNOSIS — G8194 Hemiplegia, unspecified affecting left nondominant side: Secondary | ICD-10-CM | POA: Diagnosis not present

## 2021-06-29 DIAGNOSIS — M6249 Contracture of muscle, multiple sites: Secondary | ICD-10-CM | POA: Diagnosis not present

## 2021-06-29 DIAGNOSIS — R2689 Other abnormalities of gait and mobility: Secondary | ICD-10-CM | POA: Diagnosis not present

## 2021-06-29 DIAGNOSIS — G8194 Hemiplegia, unspecified affecting left nondominant side: Secondary | ICD-10-CM | POA: Diagnosis not present

## 2021-06-30 DIAGNOSIS — R2689 Other abnormalities of gait and mobility: Secondary | ICD-10-CM | POA: Diagnosis not present

## 2021-06-30 DIAGNOSIS — M6249 Contracture of muscle, multiple sites: Secondary | ICD-10-CM | POA: Diagnosis not present

## 2021-06-30 DIAGNOSIS — G8194 Hemiplegia, unspecified affecting left nondominant side: Secondary | ICD-10-CM | POA: Diagnosis not present

## 2021-07-01 DIAGNOSIS — M79602 Pain in left arm: Secondary | ICD-10-CM | POA: Diagnosis not present

## 2021-07-01 DIAGNOSIS — W19XXXA Unspecified fall, initial encounter: Secondary | ICD-10-CM | POA: Diagnosis not present

## 2021-07-01 DIAGNOSIS — G8190 Hemiplegia, unspecified affecting unspecified side: Secondary | ICD-10-CM | POA: Diagnosis not present

## 2021-07-05 DIAGNOSIS — R7981 Abnormal blood-gas level: Secondary | ICD-10-CM | POA: Diagnosis not present

## 2021-07-05 DIAGNOSIS — M21262 Flexion deformity, left knee: Secondary | ICD-10-CM | POA: Diagnosis not present

## 2021-07-05 DIAGNOSIS — R519 Headache, unspecified: Secondary | ICD-10-CM | POA: Diagnosis not present

## 2021-07-07 DIAGNOSIS — M6249 Contracture of muscle, multiple sites: Secondary | ICD-10-CM | POA: Diagnosis not present

## 2021-07-07 DIAGNOSIS — G8194 Hemiplegia, unspecified affecting left nondominant side: Secondary | ICD-10-CM | POA: Diagnosis not present

## 2021-07-07 DIAGNOSIS — R2689 Other abnormalities of gait and mobility: Secondary | ICD-10-CM | POA: Diagnosis not present

## 2021-07-08 DIAGNOSIS — R4189 Other symptoms and signs involving cognitive functions and awareness: Secondary | ICD-10-CM | POA: Diagnosis not present

## 2021-07-08 DIAGNOSIS — R6889 Other general symptoms and signs: Secondary | ICD-10-CM | POA: Diagnosis not present

## 2021-07-08 DIAGNOSIS — M6249 Contracture of muscle, multiple sites: Secondary | ICD-10-CM | POA: Diagnosis not present

## 2021-07-08 DIAGNOSIS — F39 Unspecified mood [affective] disorder: Secondary | ICD-10-CM | POA: Diagnosis not present

## 2021-07-08 DIAGNOSIS — R2689 Other abnormalities of gait and mobility: Secondary | ICD-10-CM | POA: Diagnosis not present

## 2021-07-08 DIAGNOSIS — M79602 Pain in left arm: Secondary | ICD-10-CM | POA: Diagnosis not present

## 2021-07-08 DIAGNOSIS — M79632 Pain in left forearm: Secondary | ICD-10-CM | POA: Diagnosis not present

## 2021-07-08 DIAGNOSIS — R52 Pain, unspecified: Secondary | ICD-10-CM | POA: Diagnosis not present

## 2021-07-08 DIAGNOSIS — G8194 Hemiplegia, unspecified affecting left nondominant side: Secondary | ICD-10-CM | POA: Diagnosis not present

## 2021-07-08 DIAGNOSIS — M25522 Pain in left elbow: Secondary | ICD-10-CM | POA: Diagnosis not present

## 2021-07-10 DIAGNOSIS — G8194 Hemiplegia, unspecified affecting left nondominant side: Secondary | ICD-10-CM | POA: Diagnosis not present

## 2021-07-10 DIAGNOSIS — R2689 Other abnormalities of gait and mobility: Secondary | ICD-10-CM | POA: Diagnosis not present

## 2021-07-10 DIAGNOSIS — M6249 Contracture of muscle, multiple sites: Secondary | ICD-10-CM | POA: Diagnosis not present

## 2021-07-11 DIAGNOSIS — R2689 Other abnormalities of gait and mobility: Secondary | ICD-10-CM | POA: Diagnosis not present

## 2021-07-11 DIAGNOSIS — G8194 Hemiplegia, unspecified affecting left nondominant side: Secondary | ICD-10-CM | POA: Diagnosis not present

## 2021-07-11 DIAGNOSIS — M6249 Contracture of muscle, multiple sites: Secondary | ICD-10-CM | POA: Diagnosis not present

## 2021-07-12 DIAGNOSIS — R2689 Other abnormalities of gait and mobility: Secondary | ICD-10-CM | POA: Diagnosis not present

## 2021-07-12 DIAGNOSIS — M6249 Contracture of muscle, multiple sites: Secondary | ICD-10-CM | POA: Diagnosis not present

## 2021-07-12 DIAGNOSIS — G8194 Hemiplegia, unspecified affecting left nondominant side: Secondary | ICD-10-CM | POA: Diagnosis not present

## 2021-07-13 DIAGNOSIS — M6249 Contracture of muscle, multiple sites: Secondary | ICD-10-CM | POA: Diagnosis not present

## 2021-07-13 DIAGNOSIS — R2689 Other abnormalities of gait and mobility: Secondary | ICD-10-CM | POA: Diagnosis not present

## 2021-07-13 DIAGNOSIS — G8194 Hemiplegia, unspecified affecting left nondominant side: Secondary | ICD-10-CM | POA: Diagnosis not present

## 2021-07-14 DIAGNOSIS — R2689 Other abnormalities of gait and mobility: Secondary | ICD-10-CM | POA: Diagnosis not present

## 2021-07-14 DIAGNOSIS — M6249 Contracture of muscle, multiple sites: Secondary | ICD-10-CM | POA: Diagnosis not present

## 2021-07-14 DIAGNOSIS — G8194 Hemiplegia, unspecified affecting left nondominant side: Secondary | ICD-10-CM | POA: Diagnosis not present

## 2021-07-15 DIAGNOSIS — R2689 Other abnormalities of gait and mobility: Secondary | ICD-10-CM | POA: Diagnosis not present

## 2021-07-15 DIAGNOSIS — G8194 Hemiplegia, unspecified affecting left nondominant side: Secondary | ICD-10-CM | POA: Diagnosis not present

## 2021-07-15 DIAGNOSIS — M6249 Contracture of muscle, multiple sites: Secondary | ICD-10-CM | POA: Diagnosis not present

## 2021-07-17 DIAGNOSIS — M6249 Contracture of muscle, multiple sites: Secondary | ICD-10-CM | POA: Diagnosis not present

## 2021-07-17 DIAGNOSIS — R2689 Other abnormalities of gait and mobility: Secondary | ICD-10-CM | POA: Diagnosis not present

## 2021-07-17 DIAGNOSIS — G8194 Hemiplegia, unspecified affecting left nondominant side: Secondary | ICD-10-CM | POA: Diagnosis not present

## 2021-07-18 DIAGNOSIS — G8194 Hemiplegia, unspecified affecting left nondominant side: Secondary | ICD-10-CM | POA: Diagnosis not present

## 2021-07-18 DIAGNOSIS — M6249 Contracture of muscle, multiple sites: Secondary | ICD-10-CM | POA: Diagnosis not present

## 2021-07-18 DIAGNOSIS — R2689 Other abnormalities of gait and mobility: Secondary | ICD-10-CM | POA: Diagnosis not present

## 2021-08-09 DIAGNOSIS — F251 Schizoaffective disorder, depressive type: Secondary | ICD-10-CM | POA: Diagnosis not present

## 2021-08-18 DIAGNOSIS — R1312 Dysphagia, oropharyngeal phase: Secondary | ICD-10-CM | POA: Diagnosis not present

## 2021-08-18 DIAGNOSIS — M6281 Muscle weakness (generalized): Secondary | ICD-10-CM | POA: Diagnosis not present

## 2021-08-19 DIAGNOSIS — M6281 Muscle weakness (generalized): Secondary | ICD-10-CM | POA: Diagnosis not present

## 2021-08-19 DIAGNOSIS — R1312 Dysphagia, oropharyngeal phase: Secondary | ICD-10-CM | POA: Diagnosis not present

## 2021-08-20 DIAGNOSIS — M6281 Muscle weakness (generalized): Secondary | ICD-10-CM | POA: Diagnosis not present

## 2021-08-20 DIAGNOSIS — R1312 Dysphagia, oropharyngeal phase: Secondary | ICD-10-CM | POA: Diagnosis not present

## 2021-08-22 DIAGNOSIS — M6281 Muscle weakness (generalized): Secondary | ICD-10-CM | POA: Diagnosis not present

## 2021-08-22 DIAGNOSIS — R1312 Dysphagia, oropharyngeal phase: Secondary | ICD-10-CM | POA: Diagnosis not present

## 2021-08-23 DIAGNOSIS — R1312 Dysphagia, oropharyngeal phase: Secondary | ICD-10-CM | POA: Diagnosis not present

## 2021-08-23 DIAGNOSIS — M6281 Muscle weakness (generalized): Secondary | ICD-10-CM | POA: Diagnosis not present

## 2021-08-24 DIAGNOSIS — M6281 Muscle weakness (generalized): Secondary | ICD-10-CM | POA: Diagnosis not present

## 2021-08-24 DIAGNOSIS — R1312 Dysphagia, oropharyngeal phase: Secondary | ICD-10-CM | POA: Diagnosis not present

## 2021-08-25 DIAGNOSIS — R1312 Dysphagia, oropharyngeal phase: Secondary | ICD-10-CM | POA: Diagnosis not present

## 2021-08-25 DIAGNOSIS — M6281 Muscle weakness (generalized): Secondary | ICD-10-CM | POA: Diagnosis not present

## 2021-08-26 DIAGNOSIS — M6281 Muscle weakness (generalized): Secondary | ICD-10-CM | POA: Diagnosis not present

## 2021-08-26 DIAGNOSIS — R1312 Dysphagia, oropharyngeal phase: Secondary | ICD-10-CM | POA: Diagnosis not present

## 2021-08-29 DIAGNOSIS — R1312 Dysphagia, oropharyngeal phase: Secondary | ICD-10-CM | POA: Diagnosis not present

## 2021-08-29 DIAGNOSIS — M6281 Muscle weakness (generalized): Secondary | ICD-10-CM | POA: Diagnosis not present

## 2021-08-30 DIAGNOSIS — I10 Essential (primary) hypertension: Secondary | ICD-10-CM | POA: Diagnosis not present

## 2021-08-30 DIAGNOSIS — F259 Schizoaffective disorder, unspecified: Secondary | ICD-10-CM | POA: Diagnosis not present

## 2021-08-30 DIAGNOSIS — G8190 Hemiplegia, unspecified affecting unspecified side: Secondary | ICD-10-CM | POA: Diagnosis not present

## 2021-08-30 DIAGNOSIS — R634 Abnormal weight loss: Secondary | ICD-10-CM | POA: Diagnosis not present

## 2021-08-30 DIAGNOSIS — M6281 Muscle weakness (generalized): Secondary | ICD-10-CM | POA: Diagnosis not present

## 2021-08-30 DIAGNOSIS — R1312 Dysphagia, oropharyngeal phase: Secondary | ICD-10-CM | POA: Diagnosis not present

## 2021-08-30 DIAGNOSIS — R8290 Unspecified abnormal findings in urine: Secondary | ICD-10-CM | POA: Diagnosis not present

## 2021-08-30 DIAGNOSIS — R4182 Altered mental status, unspecified: Secondary | ICD-10-CM | POA: Diagnosis not present

## 2021-08-30 DIAGNOSIS — D649 Anemia, unspecified: Secondary | ICD-10-CM | POA: Diagnosis not present

## 2021-08-31 DIAGNOSIS — R1312 Dysphagia, oropharyngeal phase: Secondary | ICD-10-CM | POA: Diagnosis not present

## 2021-08-31 DIAGNOSIS — M6281 Muscle weakness (generalized): Secondary | ICD-10-CM | POA: Diagnosis not present

## 2021-08-31 DIAGNOSIS — N390 Urinary tract infection, site not specified: Secondary | ICD-10-CM | POA: Diagnosis not present

## 2021-09-01 DIAGNOSIS — M6281 Muscle weakness (generalized): Secondary | ICD-10-CM | POA: Diagnosis not present

## 2021-09-01 DIAGNOSIS — F4323 Adjustment disorder with mixed anxiety and depressed mood: Secondary | ICD-10-CM | POA: Diagnosis not present

## 2021-09-01 DIAGNOSIS — R1312 Dysphagia, oropharyngeal phase: Secondary | ICD-10-CM | POA: Diagnosis not present

## 2021-09-02 DIAGNOSIS — M6281 Muscle weakness (generalized): Secondary | ICD-10-CM | POA: Diagnosis not present

## 2021-09-02 DIAGNOSIS — R1312 Dysphagia, oropharyngeal phase: Secondary | ICD-10-CM | POA: Diagnosis not present

## 2021-09-03 DIAGNOSIS — N390 Urinary tract infection, site not specified: Secondary | ICD-10-CM | POA: Diagnosis not present

## 2021-09-05 DIAGNOSIS — M79641 Pain in right hand: Secondary | ICD-10-CM | POA: Diagnosis not present

## 2021-09-05 DIAGNOSIS — G8190 Hemiplegia, unspecified affecting unspecified side: Secondary | ICD-10-CM | POA: Diagnosis not present

## 2021-09-05 DIAGNOSIS — R1312 Dysphagia, oropharyngeal phase: Secondary | ICD-10-CM | POA: Diagnosis not present

## 2021-09-05 DIAGNOSIS — M6281 Muscle weakness (generalized): Secondary | ICD-10-CM | POA: Diagnosis not present

## 2021-09-05 DIAGNOSIS — F4323 Adjustment disorder with mixed anxiety and depressed mood: Secondary | ICD-10-CM | POA: Diagnosis not present

## 2021-09-05 DIAGNOSIS — F418 Other specified anxiety disorders: Secondary | ICD-10-CM | POA: Diagnosis not present

## 2021-09-05 DIAGNOSIS — R8290 Unspecified abnormal findings in urine: Secondary | ICD-10-CM | POA: Diagnosis not present

## 2021-09-05 DIAGNOSIS — M79642 Pain in left hand: Secondary | ICD-10-CM | POA: Diagnosis not present

## 2021-09-05 DIAGNOSIS — Z1331 Encounter for screening for depression: Secondary | ICD-10-CM | POA: Diagnosis not present

## 2021-09-06 DIAGNOSIS — R1312 Dysphagia, oropharyngeal phase: Secondary | ICD-10-CM | POA: Diagnosis not present

## 2021-09-06 DIAGNOSIS — M6281 Muscle weakness (generalized): Secondary | ICD-10-CM | POA: Diagnosis not present

## 2021-09-07 DIAGNOSIS — M25532 Pain in left wrist: Secondary | ICD-10-CM | POA: Diagnosis not present

## 2021-09-07 DIAGNOSIS — R1312 Dysphagia, oropharyngeal phase: Secondary | ICD-10-CM | POA: Diagnosis not present

## 2021-09-07 DIAGNOSIS — M6281 Muscle weakness (generalized): Secondary | ICD-10-CM | POA: Diagnosis not present

## 2021-09-07 DIAGNOSIS — M79642 Pain in left hand: Secondary | ICD-10-CM | POA: Diagnosis not present

## 2021-09-08 DIAGNOSIS — E785 Hyperlipidemia, unspecified: Secondary | ICD-10-CM | POA: Diagnosis not present

## 2021-09-08 DIAGNOSIS — D649 Anemia, unspecified: Secondary | ICD-10-CM | POA: Diagnosis not present

## 2021-09-08 DIAGNOSIS — F0150 Vascular dementia without behavioral disturbance: Secondary | ICD-10-CM | POA: Diagnosis not present

## 2021-09-08 DIAGNOSIS — R1312 Dysphagia, oropharyngeal phase: Secondary | ICD-10-CM | POA: Diagnosis not present

## 2021-09-08 DIAGNOSIS — M6281 Muscle weakness (generalized): Secondary | ICD-10-CM | POA: Diagnosis not present

## 2021-09-08 DIAGNOSIS — F259 Schizoaffective disorder, unspecified: Secondary | ICD-10-CM | POA: Diagnosis not present

## 2021-09-08 DIAGNOSIS — Z8673 Personal history of transient ischemic attack (TIA), and cerebral infarction without residual deficits: Secondary | ICD-10-CM | POA: Diagnosis not present

## 2021-09-08 DIAGNOSIS — I10 Essential (primary) hypertension: Secondary | ICD-10-CM | POA: Diagnosis not present

## 2021-09-09 DIAGNOSIS — R1312 Dysphagia, oropharyngeal phase: Secondary | ICD-10-CM | POA: Diagnosis not present

## 2021-09-09 DIAGNOSIS — M6281 Muscle weakness (generalized): Secondary | ICD-10-CM | POA: Diagnosis not present

## 2021-09-12 DIAGNOSIS — M6281 Muscle weakness (generalized): Secondary | ICD-10-CM | POA: Diagnosis not present

## 2021-09-12 DIAGNOSIS — G8194 Hemiplegia, unspecified affecting left nondominant side: Secondary | ICD-10-CM | POA: Diagnosis not present

## 2021-09-12 DIAGNOSIS — R278 Other lack of coordination: Secondary | ICD-10-CM | POA: Diagnosis not present

## 2021-09-13 DIAGNOSIS — G8194 Hemiplegia, unspecified affecting left nondominant side: Secondary | ICD-10-CM | POA: Diagnosis not present

## 2021-09-13 DIAGNOSIS — R278 Other lack of coordination: Secondary | ICD-10-CM | POA: Diagnosis not present

## 2021-09-13 DIAGNOSIS — M6281 Muscle weakness (generalized): Secondary | ICD-10-CM | POA: Diagnosis not present

## 2021-09-16 DIAGNOSIS — R278 Other lack of coordination: Secondary | ICD-10-CM | POA: Diagnosis not present

## 2021-09-16 DIAGNOSIS — G8194 Hemiplegia, unspecified affecting left nondominant side: Secondary | ICD-10-CM | POA: Diagnosis not present

## 2021-09-16 DIAGNOSIS — M6281 Muscle weakness (generalized): Secondary | ICD-10-CM | POA: Diagnosis not present

## 2021-09-30 DIAGNOSIS — M6281 Muscle weakness (generalized): Secondary | ICD-10-CM | POA: Diagnosis not present

## 2021-09-30 DIAGNOSIS — G8194 Hemiplegia, unspecified affecting left nondominant side: Secondary | ICD-10-CM | POA: Diagnosis not present

## 2021-09-30 DIAGNOSIS — R278 Other lack of coordination: Secondary | ICD-10-CM | POA: Diagnosis not present

## 2021-10-03 DIAGNOSIS — F418 Other specified anxiety disorders: Secondary | ICD-10-CM | POA: Diagnosis not present

## 2021-10-03 DIAGNOSIS — G8194 Hemiplegia, unspecified affecting left nondominant side: Secondary | ICD-10-CM | POA: Diagnosis not present

## 2021-10-03 DIAGNOSIS — R278 Other lack of coordination: Secondary | ICD-10-CM | POA: Diagnosis not present

## 2021-10-03 DIAGNOSIS — F3130 Bipolar disorder, current episode depressed, mild or moderate severity, unspecified: Secondary | ICD-10-CM | POA: Diagnosis not present

## 2021-10-03 DIAGNOSIS — M6281 Muscle weakness (generalized): Secondary | ICD-10-CM | POA: Diagnosis not present

## 2021-10-03 DIAGNOSIS — F200 Paranoid schizophrenia: Secondary | ICD-10-CM | POA: Diagnosis not present

## 2021-10-04 DIAGNOSIS — M6281 Muscle weakness (generalized): Secondary | ICD-10-CM | POA: Diagnosis not present

## 2021-10-04 DIAGNOSIS — G8194 Hemiplegia, unspecified affecting left nondominant side: Secondary | ICD-10-CM | POA: Diagnosis not present

## 2021-10-04 DIAGNOSIS — R278 Other lack of coordination: Secondary | ICD-10-CM | POA: Diagnosis not present

## 2021-10-05 DIAGNOSIS — M6281 Muscle weakness (generalized): Secondary | ICD-10-CM | POA: Diagnosis not present

## 2021-10-05 DIAGNOSIS — G8194 Hemiplegia, unspecified affecting left nondominant side: Secondary | ICD-10-CM | POA: Diagnosis not present

## 2021-10-05 DIAGNOSIS — R278 Other lack of coordination: Secondary | ICD-10-CM | POA: Diagnosis not present

## 2021-10-06 DIAGNOSIS — G8194 Hemiplegia, unspecified affecting left nondominant side: Secondary | ICD-10-CM | POA: Diagnosis not present

## 2021-10-06 DIAGNOSIS — R278 Other lack of coordination: Secondary | ICD-10-CM | POA: Diagnosis not present

## 2021-10-06 DIAGNOSIS — M6281 Muscle weakness (generalized): Secondary | ICD-10-CM | POA: Diagnosis not present

## 2021-10-07 DIAGNOSIS — G8194 Hemiplegia, unspecified affecting left nondominant side: Secondary | ICD-10-CM | POA: Diagnosis not present

## 2021-10-07 DIAGNOSIS — M6281 Muscle weakness (generalized): Secondary | ICD-10-CM | POA: Diagnosis not present

## 2021-10-07 DIAGNOSIS — R278 Other lack of coordination: Secondary | ICD-10-CM | POA: Diagnosis not present

## 2021-10-10 DIAGNOSIS — M6281 Muscle weakness (generalized): Secondary | ICD-10-CM | POA: Diagnosis not present

## 2021-10-10 DIAGNOSIS — R278 Other lack of coordination: Secondary | ICD-10-CM | POA: Diagnosis not present

## 2021-10-10 DIAGNOSIS — G8194 Hemiplegia, unspecified affecting left nondominant side: Secondary | ICD-10-CM | POA: Diagnosis not present

## 2021-10-11 DIAGNOSIS — R278 Other lack of coordination: Secondary | ICD-10-CM | POA: Diagnosis not present

## 2021-10-11 DIAGNOSIS — G8194 Hemiplegia, unspecified affecting left nondominant side: Secondary | ICD-10-CM | POA: Diagnosis not present

## 2021-10-12 DIAGNOSIS — R278 Other lack of coordination: Secondary | ICD-10-CM | POA: Diagnosis not present

## 2021-10-12 DIAGNOSIS — G8194 Hemiplegia, unspecified affecting left nondominant side: Secondary | ICD-10-CM | POA: Diagnosis not present

## 2021-10-13 DIAGNOSIS — R278 Other lack of coordination: Secondary | ICD-10-CM | POA: Diagnosis not present

## 2021-10-13 DIAGNOSIS — G8194 Hemiplegia, unspecified affecting left nondominant side: Secondary | ICD-10-CM | POA: Diagnosis not present

## 2021-10-14 DIAGNOSIS — G8194 Hemiplegia, unspecified affecting left nondominant side: Secondary | ICD-10-CM | POA: Diagnosis not present

## 2021-10-14 DIAGNOSIS — R278 Other lack of coordination: Secondary | ICD-10-CM | POA: Diagnosis not present

## 2021-10-17 DIAGNOSIS — R278 Other lack of coordination: Secondary | ICD-10-CM | POA: Diagnosis not present

## 2021-10-17 DIAGNOSIS — G8194 Hemiplegia, unspecified affecting left nondominant side: Secondary | ICD-10-CM | POA: Diagnosis not present

## 2021-10-18 DIAGNOSIS — R278 Other lack of coordination: Secondary | ICD-10-CM | POA: Diagnosis not present

## 2021-10-18 DIAGNOSIS — G8194 Hemiplegia, unspecified affecting left nondominant side: Secondary | ICD-10-CM | POA: Diagnosis not present

## 2021-10-20 DIAGNOSIS — G8194 Hemiplegia, unspecified affecting left nondominant side: Secondary | ICD-10-CM | POA: Diagnosis not present

## 2021-10-20 DIAGNOSIS — R278 Other lack of coordination: Secondary | ICD-10-CM | POA: Diagnosis not present

## 2021-10-21 DIAGNOSIS — R278 Other lack of coordination: Secondary | ICD-10-CM | POA: Diagnosis not present

## 2021-10-21 DIAGNOSIS — G8194 Hemiplegia, unspecified affecting left nondominant side: Secondary | ICD-10-CM | POA: Diagnosis not present

## 2021-10-24 DIAGNOSIS — G8194 Hemiplegia, unspecified affecting left nondominant side: Secondary | ICD-10-CM | POA: Diagnosis not present

## 2021-10-24 DIAGNOSIS — R278 Other lack of coordination: Secondary | ICD-10-CM | POA: Diagnosis not present

## 2021-10-25 DIAGNOSIS — R278 Other lack of coordination: Secondary | ICD-10-CM | POA: Diagnosis not present

## 2021-10-25 DIAGNOSIS — G8194 Hemiplegia, unspecified affecting left nondominant side: Secondary | ICD-10-CM | POA: Diagnosis not present

## 2021-10-27 DIAGNOSIS — F22 Delusional disorders: Secondary | ICD-10-CM | POA: Diagnosis not present

## 2021-10-27 DIAGNOSIS — R682 Dry mouth, unspecified: Secondary | ICD-10-CM | POA: Diagnosis not present

## 2021-10-27 DIAGNOSIS — R202 Paresthesia of skin: Secondary | ICD-10-CM | POA: Diagnosis not present

## 2021-10-28 DIAGNOSIS — G8194 Hemiplegia, unspecified affecting left nondominant side: Secondary | ICD-10-CM | POA: Diagnosis not present

## 2021-10-28 DIAGNOSIS — R278 Other lack of coordination: Secondary | ICD-10-CM | POA: Diagnosis not present

## 2021-10-31 DIAGNOSIS — F3130 Bipolar disorder, current episode depressed, mild or moderate severity, unspecified: Secondary | ICD-10-CM | POA: Diagnosis not present

## 2021-10-31 DIAGNOSIS — F200 Paranoid schizophrenia: Secondary | ICD-10-CM | POA: Diagnosis not present

## 2021-10-31 DIAGNOSIS — F418 Other specified anxiety disorders: Secondary | ICD-10-CM | POA: Diagnosis not present

## 2021-11-01 DIAGNOSIS — R278 Other lack of coordination: Secondary | ICD-10-CM | POA: Diagnosis not present

## 2021-11-01 DIAGNOSIS — Z9181 History of falling: Secondary | ICD-10-CM | POA: Diagnosis not present

## 2021-11-01 DIAGNOSIS — G8194 Hemiplegia, unspecified affecting left nondominant side: Secondary | ICD-10-CM | POA: Diagnosis not present

## 2021-11-01 DIAGNOSIS — R682 Dry mouth, unspecified: Secondary | ICD-10-CM | POA: Diagnosis not present

## 2021-11-01 DIAGNOSIS — F22 Delusional disorders: Secondary | ICD-10-CM | POA: Diagnosis not present

## 2021-11-02 DIAGNOSIS — R278 Other lack of coordination: Secondary | ICD-10-CM | POA: Diagnosis not present

## 2021-11-02 DIAGNOSIS — G8194 Hemiplegia, unspecified affecting left nondominant side: Secondary | ICD-10-CM | POA: Diagnosis not present

## 2021-11-03 DIAGNOSIS — R278 Other lack of coordination: Secondary | ICD-10-CM | POA: Diagnosis not present

## 2021-11-03 DIAGNOSIS — G8194 Hemiplegia, unspecified affecting left nondominant side: Secondary | ICD-10-CM | POA: Diagnosis not present

## 2021-11-04 DIAGNOSIS — R278 Other lack of coordination: Secondary | ICD-10-CM | POA: Diagnosis not present

## 2021-11-04 DIAGNOSIS — G8194 Hemiplegia, unspecified affecting left nondominant side: Secondary | ICD-10-CM | POA: Diagnosis not present

## 2021-11-07 DIAGNOSIS — G8194 Hemiplegia, unspecified affecting left nondominant side: Secondary | ICD-10-CM | POA: Diagnosis not present

## 2021-11-07 DIAGNOSIS — R278 Other lack of coordination: Secondary | ICD-10-CM | POA: Diagnosis not present

## 2021-11-08 DIAGNOSIS — Z9181 History of falling: Secondary | ICD-10-CM | POA: Diagnosis not present

## 2021-11-08 DIAGNOSIS — M24461 Recurrent dislocation, right knee: Secondary | ICD-10-CM | POA: Diagnosis not present

## 2021-11-08 DIAGNOSIS — I69359 Hemiplegia and hemiparesis following cerebral infarction affecting unspecified side: Secondary | ICD-10-CM | POA: Diagnosis not present

## 2021-11-08 DIAGNOSIS — F0393 Unspecified dementia, unspecified severity, with mood disturbance: Secondary | ICD-10-CM | POA: Diagnosis not present

## 2021-11-08 DIAGNOSIS — F0392 Unspecified dementia, unspecified severity, with psychotic disturbance: Secondary | ICD-10-CM | POA: Diagnosis not present

## 2021-11-08 DIAGNOSIS — R278 Other lack of coordination: Secondary | ICD-10-CM | POA: Diagnosis not present

## 2021-11-08 DIAGNOSIS — G8194 Hemiplegia, unspecified affecting left nondominant side: Secondary | ICD-10-CM | POA: Diagnosis not present

## 2021-11-08 DIAGNOSIS — F259 Schizoaffective disorder, unspecified: Secondary | ICD-10-CM | POA: Diagnosis not present

## 2021-11-09 DIAGNOSIS — R278 Other lack of coordination: Secondary | ICD-10-CM | POA: Diagnosis not present

## 2021-11-09 DIAGNOSIS — Z23 Encounter for immunization: Secondary | ICD-10-CM | POA: Diagnosis not present

## 2021-11-09 DIAGNOSIS — G8194 Hemiplegia, unspecified affecting left nondominant side: Secondary | ICD-10-CM | POA: Diagnosis not present

## 2021-11-10 DIAGNOSIS — R278 Other lack of coordination: Secondary | ICD-10-CM | POA: Diagnosis not present

## 2021-11-10 DIAGNOSIS — L602 Onychogryphosis: Secondary | ICD-10-CM | POA: Diagnosis not present

## 2021-11-10 DIAGNOSIS — G8194 Hemiplegia, unspecified affecting left nondominant side: Secondary | ICD-10-CM | POA: Diagnosis not present

## 2021-11-10 DIAGNOSIS — Z9181 History of falling: Secondary | ICD-10-CM | POA: Diagnosis not present

## 2021-11-10 DIAGNOSIS — S91209A Unspecified open wound of unspecified toe(s) with damage to nail, initial encounter: Secondary | ICD-10-CM | POA: Diagnosis not present

## 2021-11-11 DIAGNOSIS — M6249 Contracture of muscle, multiple sites: Secondary | ICD-10-CM | POA: Diagnosis not present

## 2021-11-11 DIAGNOSIS — I6389 Other cerebral infarction: Secondary | ICD-10-CM | POA: Diagnosis not present

## 2021-11-11 DIAGNOSIS — G8194 Hemiplegia, unspecified affecting left nondominant side: Secondary | ICD-10-CM | POA: Diagnosis not present

## 2021-11-11 DIAGNOSIS — R278 Other lack of coordination: Secondary | ICD-10-CM | POA: Diagnosis not present

## 2021-11-11 DIAGNOSIS — M170 Bilateral primary osteoarthritis of knee: Secondary | ICD-10-CM | POA: Diagnosis not present

## 2021-11-11 DIAGNOSIS — M6281 Muscle weakness (generalized): Secondary | ICD-10-CM | POA: Diagnosis not present

## 2021-11-16 DIAGNOSIS — M2012 Hallux valgus (acquired), left foot: Secondary | ICD-10-CM | POA: Diagnosis not present

## 2021-11-16 DIAGNOSIS — B351 Tinea unguium: Secondary | ICD-10-CM | POA: Diagnosis not present

## 2021-11-16 DIAGNOSIS — I739 Peripheral vascular disease, unspecified: Secondary | ICD-10-CM | POA: Diagnosis not present

## 2021-11-16 DIAGNOSIS — M2011 Hallux valgus (acquired), right foot: Secondary | ICD-10-CM | POA: Diagnosis not present

## 2021-11-21 DIAGNOSIS — F3130 Bipolar disorder, current episode depressed, mild or moderate severity, unspecified: Secondary | ICD-10-CM | POA: Diagnosis not present

## 2021-11-21 DIAGNOSIS — F200 Paranoid schizophrenia: Secondary | ICD-10-CM | POA: Diagnosis not present

## 2021-11-21 DIAGNOSIS — F418 Other specified anxiety disorders: Secondary | ICD-10-CM | POA: Diagnosis not present

## 2021-11-29 DIAGNOSIS — M170 Bilateral primary osteoarthritis of knee: Secondary | ICD-10-CM | POA: Diagnosis not present

## 2021-11-29 DIAGNOSIS — R278 Other lack of coordination: Secondary | ICD-10-CM | POA: Diagnosis not present

## 2021-11-29 DIAGNOSIS — I6389 Other cerebral infarction: Secondary | ICD-10-CM | POA: Diagnosis not present

## 2021-11-29 DIAGNOSIS — G8194 Hemiplegia, unspecified affecting left nondominant side: Secondary | ICD-10-CM | POA: Diagnosis not present

## 2021-11-29 DIAGNOSIS — M6281 Muscle weakness (generalized): Secondary | ICD-10-CM | POA: Diagnosis not present

## 2021-11-29 DIAGNOSIS — M6249 Contracture of muscle, multiple sites: Secondary | ICD-10-CM | POA: Diagnosis not present

## 2021-11-30 DIAGNOSIS — R278 Other lack of coordination: Secondary | ICD-10-CM | POA: Diagnosis not present

## 2021-11-30 DIAGNOSIS — G8194 Hemiplegia, unspecified affecting left nondominant side: Secondary | ICD-10-CM | POA: Diagnosis not present

## 2021-11-30 DIAGNOSIS — M170 Bilateral primary osteoarthritis of knee: Secondary | ICD-10-CM | POA: Diagnosis not present

## 2021-11-30 DIAGNOSIS — M6249 Contracture of muscle, multiple sites: Secondary | ICD-10-CM | POA: Diagnosis not present

## 2021-11-30 DIAGNOSIS — I6389 Other cerebral infarction: Secondary | ICD-10-CM | POA: Diagnosis not present

## 2021-11-30 DIAGNOSIS — M6281 Muscle weakness (generalized): Secondary | ICD-10-CM | POA: Diagnosis not present

## 2021-12-01 DIAGNOSIS — G8194 Hemiplegia, unspecified affecting left nondominant side: Secondary | ICD-10-CM | POA: Diagnosis not present

## 2021-12-01 DIAGNOSIS — M6249 Contracture of muscle, multiple sites: Secondary | ICD-10-CM | POA: Diagnosis not present

## 2021-12-01 DIAGNOSIS — M6281 Muscle weakness (generalized): Secondary | ICD-10-CM | POA: Diagnosis not present

## 2021-12-01 DIAGNOSIS — I6389 Other cerebral infarction: Secondary | ICD-10-CM | POA: Diagnosis not present

## 2021-12-01 DIAGNOSIS — R278 Other lack of coordination: Secondary | ICD-10-CM | POA: Diagnosis not present

## 2021-12-01 DIAGNOSIS — M170 Bilateral primary osteoarthritis of knee: Secondary | ICD-10-CM | POA: Diagnosis not present

## 2021-12-02 DIAGNOSIS — G8194 Hemiplegia, unspecified affecting left nondominant side: Secondary | ICD-10-CM | POA: Diagnosis not present

## 2021-12-02 DIAGNOSIS — M6281 Muscle weakness (generalized): Secondary | ICD-10-CM | POA: Diagnosis not present

## 2021-12-02 DIAGNOSIS — R278 Other lack of coordination: Secondary | ICD-10-CM | POA: Diagnosis not present

## 2021-12-02 DIAGNOSIS — M170 Bilateral primary osteoarthritis of knee: Secondary | ICD-10-CM | POA: Diagnosis not present

## 2021-12-02 DIAGNOSIS — I6389 Other cerebral infarction: Secondary | ICD-10-CM | POA: Diagnosis not present

## 2021-12-02 DIAGNOSIS — M6249 Contracture of muscle, multiple sites: Secondary | ICD-10-CM | POA: Diagnosis not present

## 2021-12-03 DIAGNOSIS — I6389 Other cerebral infarction: Secondary | ICD-10-CM | POA: Diagnosis not present

## 2021-12-03 DIAGNOSIS — M6281 Muscle weakness (generalized): Secondary | ICD-10-CM | POA: Diagnosis not present

## 2021-12-03 DIAGNOSIS — R278 Other lack of coordination: Secondary | ICD-10-CM | POA: Diagnosis not present

## 2021-12-03 DIAGNOSIS — G8194 Hemiplegia, unspecified affecting left nondominant side: Secondary | ICD-10-CM | POA: Diagnosis not present

## 2021-12-03 DIAGNOSIS — M170 Bilateral primary osteoarthritis of knee: Secondary | ICD-10-CM | POA: Diagnosis not present

## 2021-12-03 DIAGNOSIS — M6249 Contracture of muscle, multiple sites: Secondary | ICD-10-CM | POA: Diagnosis not present

## 2021-12-05 DIAGNOSIS — F418 Other specified anxiety disorders: Secondary | ICD-10-CM | POA: Diagnosis not present

## 2021-12-05 DIAGNOSIS — F3130 Bipolar disorder, current episode depressed, mild or moderate severity, unspecified: Secondary | ICD-10-CM | POA: Diagnosis not present

## 2021-12-05 DIAGNOSIS — F200 Paranoid schizophrenia: Secondary | ICD-10-CM | POA: Diagnosis not present

## 2021-12-06 DIAGNOSIS — R278 Other lack of coordination: Secondary | ICD-10-CM | POA: Diagnosis not present

## 2021-12-06 DIAGNOSIS — M6249 Contracture of muscle, multiple sites: Secondary | ICD-10-CM | POA: Diagnosis not present

## 2021-12-06 DIAGNOSIS — M170 Bilateral primary osteoarthritis of knee: Secondary | ICD-10-CM | POA: Diagnosis not present

## 2021-12-06 DIAGNOSIS — M6281 Muscle weakness (generalized): Secondary | ICD-10-CM | POA: Diagnosis not present

## 2021-12-06 DIAGNOSIS — F4323 Adjustment disorder with mixed anxiety and depressed mood: Secondary | ICD-10-CM | POA: Diagnosis not present

## 2021-12-06 DIAGNOSIS — I6389 Other cerebral infarction: Secondary | ICD-10-CM | POA: Diagnosis not present

## 2021-12-06 DIAGNOSIS — G8194 Hemiplegia, unspecified affecting left nondominant side: Secondary | ICD-10-CM | POA: Diagnosis not present

## 2021-12-07 DIAGNOSIS — M170 Bilateral primary osteoarthritis of knee: Secondary | ICD-10-CM | POA: Diagnosis not present

## 2021-12-07 DIAGNOSIS — M6249 Contracture of muscle, multiple sites: Secondary | ICD-10-CM | POA: Diagnosis not present

## 2021-12-07 DIAGNOSIS — I6389 Other cerebral infarction: Secondary | ICD-10-CM | POA: Diagnosis not present

## 2021-12-07 DIAGNOSIS — M6281 Muscle weakness (generalized): Secondary | ICD-10-CM | POA: Diagnosis not present

## 2021-12-07 DIAGNOSIS — R278 Other lack of coordination: Secondary | ICD-10-CM | POA: Diagnosis not present

## 2021-12-07 DIAGNOSIS — G8194 Hemiplegia, unspecified affecting left nondominant side: Secondary | ICD-10-CM | POA: Diagnosis not present

## 2021-12-08 DIAGNOSIS — M6281 Muscle weakness (generalized): Secondary | ICD-10-CM | POA: Diagnosis not present

## 2021-12-08 DIAGNOSIS — R278 Other lack of coordination: Secondary | ICD-10-CM | POA: Diagnosis not present

## 2021-12-08 DIAGNOSIS — M170 Bilateral primary osteoarthritis of knee: Secondary | ICD-10-CM | POA: Diagnosis not present

## 2021-12-08 DIAGNOSIS — G8194 Hemiplegia, unspecified affecting left nondominant side: Secondary | ICD-10-CM | POA: Diagnosis not present

## 2021-12-08 DIAGNOSIS — M6249 Contracture of muscle, multiple sites: Secondary | ICD-10-CM | POA: Diagnosis not present

## 2021-12-08 DIAGNOSIS — I6389 Other cerebral infarction: Secondary | ICD-10-CM | POA: Diagnosis not present

## 2021-12-09 DIAGNOSIS — I6389 Other cerebral infarction: Secondary | ICD-10-CM | POA: Diagnosis not present

## 2021-12-09 DIAGNOSIS — M170 Bilateral primary osteoarthritis of knee: Secondary | ICD-10-CM | POA: Diagnosis not present

## 2021-12-09 DIAGNOSIS — M6281 Muscle weakness (generalized): Secondary | ICD-10-CM | POA: Diagnosis not present

## 2021-12-09 DIAGNOSIS — R278 Other lack of coordination: Secondary | ICD-10-CM | POA: Diagnosis not present

## 2021-12-09 DIAGNOSIS — G8194 Hemiplegia, unspecified affecting left nondominant side: Secondary | ICD-10-CM | POA: Diagnosis not present

## 2021-12-09 DIAGNOSIS — M6249 Contracture of muscle, multiple sites: Secondary | ICD-10-CM | POA: Diagnosis not present

## 2021-12-12 DIAGNOSIS — M6249 Contracture of muscle, multiple sites: Secondary | ICD-10-CM | POA: Diagnosis not present

## 2021-12-12 DIAGNOSIS — I6389 Other cerebral infarction: Secondary | ICD-10-CM | POA: Diagnosis not present

## 2021-12-12 DIAGNOSIS — M170 Bilateral primary osteoarthritis of knee: Secondary | ICD-10-CM | POA: Diagnosis not present

## 2021-12-12 DIAGNOSIS — M6281 Muscle weakness (generalized): Secondary | ICD-10-CM | POA: Diagnosis not present

## 2021-12-13 DIAGNOSIS — I6389 Other cerebral infarction: Secondary | ICD-10-CM | POA: Diagnosis not present

## 2021-12-13 DIAGNOSIS — Z7185 Encounter for immunization safety counseling: Secondary | ICD-10-CM | POA: Diagnosis not present

## 2021-12-13 DIAGNOSIS — M6281 Muscle weakness (generalized): Secondary | ICD-10-CM | POA: Diagnosis not present

## 2021-12-13 DIAGNOSIS — M170 Bilateral primary osteoarthritis of knee: Secondary | ICD-10-CM | POA: Diagnosis not present

## 2021-12-13 DIAGNOSIS — F4323 Adjustment disorder with mixed anxiety and depressed mood: Secondary | ICD-10-CM | POA: Diagnosis not present

## 2021-12-13 DIAGNOSIS — Z23 Encounter for immunization: Secondary | ICD-10-CM | POA: Diagnosis not present

## 2021-12-13 DIAGNOSIS — M6249 Contracture of muscle, multiple sites: Secondary | ICD-10-CM | POA: Diagnosis not present

## 2021-12-14 DIAGNOSIS — M170 Bilateral primary osteoarthritis of knee: Secondary | ICD-10-CM | POA: Diagnosis not present

## 2021-12-14 DIAGNOSIS — I6389 Other cerebral infarction: Secondary | ICD-10-CM | POA: Diagnosis not present

## 2021-12-14 DIAGNOSIS — M6249 Contracture of muscle, multiple sites: Secondary | ICD-10-CM | POA: Diagnosis not present

## 2021-12-14 DIAGNOSIS — M6281 Muscle weakness (generalized): Secondary | ICD-10-CM | POA: Diagnosis not present

## 2021-12-15 DIAGNOSIS — M170 Bilateral primary osteoarthritis of knee: Secondary | ICD-10-CM | POA: Diagnosis not present

## 2021-12-15 DIAGNOSIS — M6249 Contracture of muscle, multiple sites: Secondary | ICD-10-CM | POA: Diagnosis not present

## 2021-12-15 DIAGNOSIS — M6281 Muscle weakness (generalized): Secondary | ICD-10-CM | POA: Diagnosis not present

## 2021-12-15 DIAGNOSIS — I6389 Other cerebral infarction: Secondary | ICD-10-CM | POA: Diagnosis not present

## 2021-12-17 DIAGNOSIS — I6389 Other cerebral infarction: Secondary | ICD-10-CM | POA: Diagnosis not present

## 2021-12-17 DIAGNOSIS — M6249 Contracture of muscle, multiple sites: Secondary | ICD-10-CM | POA: Diagnosis not present

## 2021-12-17 DIAGNOSIS — M170 Bilateral primary osteoarthritis of knee: Secondary | ICD-10-CM | POA: Diagnosis not present

## 2021-12-17 DIAGNOSIS — M6281 Muscle weakness (generalized): Secondary | ICD-10-CM | POA: Diagnosis not present

## 2021-12-19 DIAGNOSIS — M6281 Muscle weakness (generalized): Secondary | ICD-10-CM | POA: Diagnosis not present

## 2021-12-19 DIAGNOSIS — M6249 Contracture of muscle, multiple sites: Secondary | ICD-10-CM | POA: Diagnosis not present

## 2021-12-19 DIAGNOSIS — I6389 Other cerebral infarction: Secondary | ICD-10-CM | POA: Diagnosis not present

## 2021-12-19 DIAGNOSIS — M170 Bilateral primary osteoarthritis of knee: Secondary | ICD-10-CM | POA: Diagnosis not present

## 2021-12-21 DIAGNOSIS — M6249 Contracture of muscle, multiple sites: Secondary | ICD-10-CM | POA: Diagnosis not present

## 2021-12-21 DIAGNOSIS — M6281 Muscle weakness (generalized): Secondary | ICD-10-CM | POA: Diagnosis not present

## 2021-12-21 DIAGNOSIS — I6389 Other cerebral infarction: Secondary | ICD-10-CM | POA: Diagnosis not present

## 2021-12-21 DIAGNOSIS — M170 Bilateral primary osteoarthritis of knee: Secondary | ICD-10-CM | POA: Diagnosis not present

## 2021-12-22 DIAGNOSIS — M170 Bilateral primary osteoarthritis of knee: Secondary | ICD-10-CM | POA: Diagnosis not present

## 2021-12-22 DIAGNOSIS — I6389 Other cerebral infarction: Secondary | ICD-10-CM | POA: Diagnosis not present

## 2021-12-22 DIAGNOSIS — M6281 Muscle weakness (generalized): Secondary | ICD-10-CM | POA: Diagnosis not present

## 2021-12-22 DIAGNOSIS — M6249 Contracture of muscle, multiple sites: Secondary | ICD-10-CM | POA: Diagnosis not present

## 2021-12-26 DIAGNOSIS — M6281 Muscle weakness (generalized): Secondary | ICD-10-CM | POA: Diagnosis not present

## 2021-12-26 DIAGNOSIS — M170 Bilateral primary osteoarthritis of knee: Secondary | ICD-10-CM | POA: Diagnosis not present

## 2021-12-26 DIAGNOSIS — I6389 Other cerebral infarction: Secondary | ICD-10-CM | POA: Diagnosis not present

## 2021-12-26 DIAGNOSIS — M6249 Contracture of muscle, multiple sites: Secondary | ICD-10-CM | POA: Diagnosis not present

## 2021-12-27 DIAGNOSIS — M6281 Muscle weakness (generalized): Secondary | ICD-10-CM | POA: Diagnosis not present

## 2021-12-27 DIAGNOSIS — M6249 Contracture of muscle, multiple sites: Secondary | ICD-10-CM | POA: Diagnosis not present

## 2021-12-27 DIAGNOSIS — M170 Bilateral primary osteoarthritis of knee: Secondary | ICD-10-CM | POA: Diagnosis not present

## 2021-12-27 DIAGNOSIS — I6389 Other cerebral infarction: Secondary | ICD-10-CM | POA: Diagnosis not present

## 2021-12-28 DIAGNOSIS — M170 Bilateral primary osteoarthritis of knee: Secondary | ICD-10-CM | POA: Diagnosis not present

## 2021-12-28 DIAGNOSIS — M6281 Muscle weakness (generalized): Secondary | ICD-10-CM | POA: Diagnosis not present

## 2021-12-28 DIAGNOSIS — M6249 Contracture of muscle, multiple sites: Secondary | ICD-10-CM | POA: Diagnosis not present

## 2021-12-28 DIAGNOSIS — I6389 Other cerebral infarction: Secondary | ICD-10-CM | POA: Diagnosis not present

## 2022-01-02 DIAGNOSIS — F251 Schizoaffective disorder, depressive type: Secondary | ICD-10-CM | POA: Diagnosis not present

## 2022-01-13 DIAGNOSIS — R7981 Abnormal blood-gas level: Secondary | ICD-10-CM | POA: Diagnosis not present

## 2022-01-13 DIAGNOSIS — H6123 Impacted cerumen, bilateral: Secondary | ICD-10-CM | POA: Diagnosis not present

## 2022-01-13 DIAGNOSIS — R6889 Other general symptoms and signs: Secondary | ICD-10-CM | POA: Diagnosis not present

## 2022-01-13 DIAGNOSIS — H9190 Unspecified hearing loss, unspecified ear: Secondary | ICD-10-CM | POA: Diagnosis not present

## 2022-01-16 DIAGNOSIS — H6123 Impacted cerumen, bilateral: Secondary | ICD-10-CM | POA: Diagnosis not present

## 2022-01-24 DIAGNOSIS — R278 Other lack of coordination: Secondary | ICD-10-CM | POA: Diagnosis not present

## 2022-01-24 DIAGNOSIS — G8194 Hemiplegia, unspecified affecting left nondominant side: Secondary | ICD-10-CM | POA: Diagnosis not present

## 2022-01-24 DIAGNOSIS — M6281 Muscle weakness (generalized): Secondary | ICD-10-CM | POA: Diagnosis not present

## 2022-01-24 DIAGNOSIS — R1311 Dysphagia, oral phase: Secondary | ICD-10-CM | POA: Diagnosis not present

## 2022-01-24 DIAGNOSIS — R2681 Unsteadiness on feet: Secondary | ICD-10-CM | POA: Diagnosis not present

## 2022-01-24 DIAGNOSIS — R262 Difficulty in walking, not elsewhere classified: Secondary | ICD-10-CM | POA: Diagnosis not present

## 2022-01-25 DIAGNOSIS — R262 Difficulty in walking, not elsewhere classified: Secondary | ICD-10-CM | POA: Diagnosis not present

## 2022-01-25 DIAGNOSIS — G8194 Hemiplegia, unspecified affecting left nondominant side: Secondary | ICD-10-CM | POA: Diagnosis not present

## 2022-01-25 DIAGNOSIS — H6123 Impacted cerumen, bilateral: Secondary | ICD-10-CM | POA: Diagnosis not present

## 2022-01-25 DIAGNOSIS — M6281 Muscle weakness (generalized): Secondary | ICD-10-CM | POA: Diagnosis not present

## 2022-01-25 DIAGNOSIS — R278 Other lack of coordination: Secondary | ICD-10-CM | POA: Diagnosis not present

## 2022-01-25 DIAGNOSIS — R2681 Unsteadiness on feet: Secondary | ICD-10-CM | POA: Diagnosis not present

## 2022-01-25 DIAGNOSIS — Z66 Do not resuscitate: Secondary | ICD-10-CM | POA: Diagnosis not present

## 2022-01-25 DIAGNOSIS — G4734 Idiopathic sleep related nonobstructive alveolar hypoventilation: Secondary | ICD-10-CM | POA: Diagnosis not present

## 2022-01-25 DIAGNOSIS — R1311 Dysphagia, oral phase: Secondary | ICD-10-CM | POA: Diagnosis not present

## 2022-01-26 DIAGNOSIS — R262 Difficulty in walking, not elsewhere classified: Secondary | ICD-10-CM | POA: Diagnosis not present

## 2022-01-26 DIAGNOSIS — H04129 Dry eye syndrome of unspecified lacrimal gland: Secondary | ICD-10-CM | POA: Diagnosis not present

## 2022-01-26 DIAGNOSIS — R0989 Other specified symptoms and signs involving the circulatory and respiratory systems: Secondary | ICD-10-CM | POA: Diagnosis not present

## 2022-01-26 DIAGNOSIS — M6281 Muscle weakness (generalized): Secondary | ICD-10-CM | POA: Diagnosis not present

## 2022-01-26 DIAGNOSIS — R2681 Unsteadiness on feet: Secondary | ICD-10-CM | POA: Diagnosis not present

## 2022-01-26 DIAGNOSIS — R1311 Dysphagia, oral phase: Secondary | ICD-10-CM | POA: Diagnosis not present

## 2022-01-26 DIAGNOSIS — G4734 Idiopathic sleep related nonobstructive alveolar hypoventilation: Secondary | ICD-10-CM | POA: Diagnosis not present

## 2022-01-26 DIAGNOSIS — R278 Other lack of coordination: Secondary | ICD-10-CM | POA: Diagnosis not present

## 2022-01-26 DIAGNOSIS — G8194 Hemiplegia, unspecified affecting left nondominant side: Secondary | ICD-10-CM | POA: Diagnosis not present

## 2022-01-27 DIAGNOSIS — R278 Other lack of coordination: Secondary | ICD-10-CM | POA: Diagnosis not present

## 2022-01-27 DIAGNOSIS — R2681 Unsteadiness on feet: Secondary | ICD-10-CM | POA: Diagnosis not present

## 2022-01-27 DIAGNOSIS — R262 Difficulty in walking, not elsewhere classified: Secondary | ICD-10-CM | POA: Diagnosis not present

## 2022-01-27 DIAGNOSIS — M6281 Muscle weakness (generalized): Secondary | ICD-10-CM | POA: Diagnosis not present

## 2022-01-27 DIAGNOSIS — G8194 Hemiplegia, unspecified affecting left nondominant side: Secondary | ICD-10-CM | POA: Diagnosis not present

## 2022-01-27 DIAGNOSIS — R1311 Dysphagia, oral phase: Secondary | ICD-10-CM | POA: Diagnosis not present

## 2022-01-30 DIAGNOSIS — R1311 Dysphagia, oral phase: Secondary | ICD-10-CM | POA: Diagnosis not present

## 2022-01-30 DIAGNOSIS — M6281 Muscle weakness (generalized): Secondary | ICD-10-CM | POA: Diagnosis not present

## 2022-01-30 DIAGNOSIS — G8194 Hemiplegia, unspecified affecting left nondominant side: Secondary | ICD-10-CM | POA: Diagnosis not present

## 2022-01-30 DIAGNOSIS — R262 Difficulty in walking, not elsewhere classified: Secondary | ICD-10-CM | POA: Diagnosis not present

## 2022-01-30 DIAGNOSIS — R2681 Unsteadiness on feet: Secondary | ICD-10-CM | POA: Diagnosis not present

## 2022-01-30 DIAGNOSIS — R278 Other lack of coordination: Secondary | ICD-10-CM | POA: Diagnosis not present

## 2022-01-31 DIAGNOSIS — R1311 Dysphagia, oral phase: Secondary | ICD-10-CM | POA: Diagnosis not present

## 2022-01-31 DIAGNOSIS — G8194 Hemiplegia, unspecified affecting left nondominant side: Secondary | ICD-10-CM | POA: Diagnosis not present

## 2022-01-31 DIAGNOSIS — R262 Difficulty in walking, not elsewhere classified: Secondary | ICD-10-CM | POA: Diagnosis not present

## 2022-01-31 DIAGNOSIS — R2681 Unsteadiness on feet: Secondary | ICD-10-CM | POA: Diagnosis not present

## 2022-01-31 DIAGNOSIS — M6281 Muscle weakness (generalized): Secondary | ICD-10-CM | POA: Diagnosis not present

## 2022-01-31 DIAGNOSIS — R278 Other lack of coordination: Secondary | ICD-10-CM | POA: Diagnosis not present

## 2022-02-01 DIAGNOSIS — G8194 Hemiplegia, unspecified affecting left nondominant side: Secondary | ICD-10-CM | POA: Diagnosis not present

## 2022-02-01 DIAGNOSIS — M79641 Pain in right hand: Secondary | ICD-10-CM | POA: Diagnosis not present

## 2022-02-01 DIAGNOSIS — Z66 Do not resuscitate: Secondary | ICD-10-CM | POA: Diagnosis not present

## 2022-02-01 DIAGNOSIS — R278 Other lack of coordination: Secondary | ICD-10-CM | POA: Diagnosis not present

## 2022-02-01 DIAGNOSIS — R1311 Dysphagia, oral phase: Secondary | ICD-10-CM | POA: Diagnosis not present

## 2022-02-01 DIAGNOSIS — H6123 Impacted cerumen, bilateral: Secondary | ICD-10-CM | POA: Diagnosis not present

## 2022-02-01 DIAGNOSIS — M6281 Muscle weakness (generalized): Secondary | ICD-10-CM | POA: Diagnosis not present

## 2022-02-01 DIAGNOSIS — M79642 Pain in left hand: Secondary | ICD-10-CM | POA: Diagnosis not present

## 2022-02-01 DIAGNOSIS — R262 Difficulty in walking, not elsewhere classified: Secondary | ICD-10-CM | POA: Diagnosis not present

## 2022-02-01 DIAGNOSIS — R2681 Unsteadiness on feet: Secondary | ICD-10-CM | POA: Diagnosis not present

## 2022-02-03 DIAGNOSIS — R1311 Dysphagia, oral phase: Secondary | ICD-10-CM | POA: Diagnosis not present

## 2022-02-03 DIAGNOSIS — R278 Other lack of coordination: Secondary | ICD-10-CM | POA: Diagnosis not present

## 2022-02-03 DIAGNOSIS — R262 Difficulty in walking, not elsewhere classified: Secondary | ICD-10-CM | POA: Diagnosis not present

## 2022-02-03 DIAGNOSIS — R2681 Unsteadiness on feet: Secondary | ICD-10-CM | POA: Diagnosis not present

## 2022-02-03 DIAGNOSIS — M6281 Muscle weakness (generalized): Secondary | ICD-10-CM | POA: Diagnosis not present

## 2022-02-03 DIAGNOSIS — G8194 Hemiplegia, unspecified affecting left nondominant side: Secondary | ICD-10-CM | POA: Diagnosis not present

## 2022-02-06 DIAGNOSIS — M6281 Muscle weakness (generalized): Secondary | ICD-10-CM | POA: Diagnosis not present

## 2022-02-06 DIAGNOSIS — G8194 Hemiplegia, unspecified affecting left nondominant side: Secondary | ICD-10-CM | POA: Diagnosis not present

## 2022-02-06 DIAGNOSIS — R2681 Unsteadiness on feet: Secondary | ICD-10-CM | POA: Diagnosis not present

## 2022-02-06 DIAGNOSIS — R278 Other lack of coordination: Secondary | ICD-10-CM | POA: Diagnosis not present

## 2022-02-06 DIAGNOSIS — R262 Difficulty in walking, not elsewhere classified: Secondary | ICD-10-CM | POA: Diagnosis not present

## 2022-02-06 DIAGNOSIS — R1311 Dysphagia, oral phase: Secondary | ICD-10-CM | POA: Diagnosis not present

## 2022-02-07 DIAGNOSIS — M6281 Muscle weakness (generalized): Secondary | ICD-10-CM | POA: Diagnosis not present

## 2022-02-07 DIAGNOSIS — R278 Other lack of coordination: Secondary | ICD-10-CM | POA: Diagnosis not present

## 2022-02-07 DIAGNOSIS — R1311 Dysphagia, oral phase: Secondary | ICD-10-CM | POA: Diagnosis not present

## 2022-02-07 DIAGNOSIS — R262 Difficulty in walking, not elsewhere classified: Secondary | ICD-10-CM | POA: Diagnosis not present

## 2022-02-07 DIAGNOSIS — G8194 Hemiplegia, unspecified affecting left nondominant side: Secondary | ICD-10-CM | POA: Diagnosis not present

## 2022-02-07 DIAGNOSIS — R2681 Unsteadiness on feet: Secondary | ICD-10-CM | POA: Diagnosis not present

## 2022-02-08 DIAGNOSIS — R2681 Unsteadiness on feet: Secondary | ICD-10-CM | POA: Diagnosis not present

## 2022-02-08 DIAGNOSIS — G8194 Hemiplegia, unspecified affecting left nondominant side: Secondary | ICD-10-CM | POA: Diagnosis not present

## 2022-02-08 DIAGNOSIS — M6281 Muscle weakness (generalized): Secondary | ICD-10-CM | POA: Diagnosis not present

## 2022-02-08 DIAGNOSIS — R262 Difficulty in walking, not elsewhere classified: Secondary | ICD-10-CM | POA: Diagnosis not present

## 2022-02-08 DIAGNOSIS — R278 Other lack of coordination: Secondary | ICD-10-CM | POA: Diagnosis not present

## 2022-02-08 DIAGNOSIS — R1311 Dysphagia, oral phase: Secondary | ICD-10-CM | POA: Diagnosis not present

## 2022-02-09 DIAGNOSIS — M792 Neuralgia and neuritis, unspecified: Secondary | ICD-10-CM | POA: Diagnosis not present

## 2022-02-09 DIAGNOSIS — R278 Other lack of coordination: Secondary | ICD-10-CM | POA: Diagnosis not present

## 2022-02-09 DIAGNOSIS — R262 Difficulty in walking, not elsewhere classified: Secondary | ICD-10-CM | POA: Diagnosis not present

## 2022-02-09 DIAGNOSIS — G8194 Hemiplegia, unspecified affecting left nondominant side: Secondary | ICD-10-CM | POA: Diagnosis not present

## 2022-02-09 DIAGNOSIS — Z7982 Long term (current) use of aspirin: Secondary | ICD-10-CM | POA: Diagnosis not present

## 2022-02-09 DIAGNOSIS — Z66 Do not resuscitate: Secondary | ICD-10-CM | POA: Diagnosis not present

## 2022-02-09 DIAGNOSIS — R2681 Unsteadiness on feet: Secondary | ICD-10-CM | POA: Diagnosis not present

## 2022-02-09 DIAGNOSIS — M6281 Muscle weakness (generalized): Secondary | ICD-10-CM | POA: Diagnosis not present

## 2022-02-09 DIAGNOSIS — R1311 Dysphagia, oral phase: Secondary | ICD-10-CM | POA: Diagnosis not present

## 2022-02-10 DIAGNOSIS — R293 Abnormal posture: Secondary | ICD-10-CM | POA: Diagnosis not present

## 2022-02-10 DIAGNOSIS — R278 Other lack of coordination: Secondary | ICD-10-CM | POA: Diagnosis not present

## 2022-02-10 DIAGNOSIS — M6281 Muscle weakness (generalized): Secondary | ICD-10-CM | POA: Diagnosis not present

## 2022-02-10 DIAGNOSIS — G8194 Hemiplegia, unspecified affecting left nondominant side: Secondary | ICD-10-CM | POA: Diagnosis not present

## 2022-02-10 DIAGNOSIS — R2681 Unsteadiness on feet: Secondary | ICD-10-CM | POA: Diagnosis not present

## 2022-02-10 DIAGNOSIS — R262 Difficulty in walking, not elsewhere classified: Secondary | ICD-10-CM | POA: Diagnosis not present

## 2022-02-13 DIAGNOSIS — R293 Abnormal posture: Secondary | ICD-10-CM | POA: Diagnosis not present

## 2022-02-13 DIAGNOSIS — R278 Other lack of coordination: Secondary | ICD-10-CM | POA: Diagnosis not present

## 2022-02-13 DIAGNOSIS — R262 Difficulty in walking, not elsewhere classified: Secondary | ICD-10-CM | POA: Diagnosis not present

## 2022-02-13 DIAGNOSIS — R2681 Unsteadiness on feet: Secondary | ICD-10-CM | POA: Diagnosis not present

## 2022-02-13 DIAGNOSIS — M6281 Muscle weakness (generalized): Secondary | ICD-10-CM | POA: Diagnosis not present

## 2022-02-13 DIAGNOSIS — G8194 Hemiplegia, unspecified affecting left nondominant side: Secondary | ICD-10-CM | POA: Diagnosis not present

## 2022-02-14 ENCOUNTER — Other Ambulatory Visit: Payer: Self-pay

## 2022-02-14 ENCOUNTER — Inpatient Hospital Stay (HOSPITAL_COMMUNITY)
Admission: EM | Admit: 2022-02-14 | Discharge: 2022-02-20 | DRG: 379 | Disposition: A | Discharge status: Skilled Nursing Facility | Payer: Medicare PPO | Source: Skilled Nursing Facility | Attending: Internal Medicine | Admitting: Internal Medicine

## 2022-02-14 ENCOUNTER — Encounter (HOSPITAL_COMMUNITY): Payer: Self-pay | Admitting: Oncology

## 2022-02-14 DIAGNOSIS — G252 Other specified forms of tremor: Secondary | ICD-10-CM | POA: Diagnosis present

## 2022-02-14 DIAGNOSIS — D509 Iron deficiency anemia, unspecified: Secondary | ICD-10-CM | POA: Diagnosis not present

## 2022-02-14 DIAGNOSIS — Z8673 Personal history of transient ischemic attack (TIA), and cerebral infarction without residual deficits: Secondary | ICD-10-CM | POA: Diagnosis not present

## 2022-02-14 DIAGNOSIS — Z87891 Personal history of nicotine dependence: Secondary | ICD-10-CM

## 2022-02-14 DIAGNOSIS — K922 Gastrointestinal hemorrhage, unspecified: Secondary | ICD-10-CM | POA: Diagnosis not present

## 2022-02-14 DIAGNOSIS — Z7982 Long term (current) use of aspirin: Secondary | ICD-10-CM

## 2022-02-14 DIAGNOSIS — K5909 Other constipation: Secondary | ICD-10-CM | POA: Diagnosis present

## 2022-02-14 DIAGNOSIS — D649 Anemia, unspecified: Secondary | ICD-10-CM | POA: Diagnosis not present

## 2022-02-14 DIAGNOSIS — Z66 Do not resuscitate: Secondary | ICD-10-CM | POA: Diagnosis present

## 2022-02-14 DIAGNOSIS — H9190 Unspecified hearing loss, unspecified ear: Secondary | ICD-10-CM | POA: Diagnosis present

## 2022-02-14 DIAGNOSIS — Z8249 Family history of ischemic heart disease and other diseases of the circulatory system: Secondary | ICD-10-CM

## 2022-02-14 DIAGNOSIS — K254 Chronic or unspecified gastric ulcer with hemorrhage: Secondary | ICD-10-CM | POA: Diagnosis not present

## 2022-02-14 DIAGNOSIS — Z880 Allergy status to penicillin: Secondary | ICD-10-CM

## 2022-02-14 DIAGNOSIS — Z8719 Personal history of other diseases of the digestive system: Secondary | ICD-10-CM

## 2022-02-14 DIAGNOSIS — I10 Essential (primary) hypertension: Secondary | ICD-10-CM | POA: Diagnosis present

## 2022-02-14 DIAGNOSIS — K648 Other hemorrhoids: Secondary | ICD-10-CM | POA: Diagnosis present

## 2022-02-14 DIAGNOSIS — F32A Depression, unspecified: Secondary | ICD-10-CM | POA: Diagnosis not present

## 2022-02-14 DIAGNOSIS — Z79899 Other long term (current) drug therapy: Secondary | ICD-10-CM

## 2022-02-14 DIAGNOSIS — Z83438 Family history of other disorder of lipoprotein metabolism and other lipidemia: Secondary | ICD-10-CM | POA: Diagnosis not present

## 2022-02-14 DIAGNOSIS — K644 Residual hemorrhoidal skin tags: Secondary | ICD-10-CM | POA: Diagnosis present

## 2022-02-14 DIAGNOSIS — R195 Other fecal abnormalities: Secondary | ICD-10-CM | POA: Diagnosis not present

## 2022-02-14 DIAGNOSIS — K449 Diaphragmatic hernia without obstruction or gangrene: Secondary | ICD-10-CM | POA: Diagnosis present

## 2022-02-14 DIAGNOSIS — R531 Weakness: Secondary | ICD-10-CM | POA: Diagnosis not present

## 2022-02-14 DIAGNOSIS — Z8261 Family history of arthritis: Secondary | ICD-10-CM

## 2022-02-14 DIAGNOSIS — I350 Nonrheumatic aortic (valve) stenosis: Secondary | ICD-10-CM | POA: Diagnosis not present

## 2022-02-14 DIAGNOSIS — R293 Abnormal posture: Secondary | ICD-10-CM | POA: Diagnosis not present

## 2022-02-14 DIAGNOSIS — Z7401 Bed confinement status: Secondary | ICD-10-CM | POA: Diagnosis not present

## 2022-02-14 DIAGNOSIS — F609 Personality disorder, unspecified: Secondary | ICD-10-CM | POA: Diagnosis not present

## 2022-02-14 DIAGNOSIS — E559 Vitamin D deficiency, unspecified: Secondary | ICD-10-CM | POA: Diagnosis present

## 2022-02-14 DIAGNOSIS — F259 Schizoaffective disorder, unspecified: Secondary | ICD-10-CM | POA: Diagnosis present

## 2022-02-14 DIAGNOSIS — K259 Gastric ulcer, unspecified as acute or chronic, without hemorrhage or perforation: Secondary | ICD-10-CM | POA: Diagnosis not present

## 2022-02-14 DIAGNOSIS — G8194 Hemiplegia, unspecified affecting left nondominant side: Secondary | ICD-10-CM | POA: Diagnosis not present

## 2022-02-14 DIAGNOSIS — E869 Volume depletion, unspecified: Secondary | ICD-10-CM | POA: Diagnosis present

## 2022-02-14 DIAGNOSIS — K6289 Other specified diseases of anus and rectum: Secondary | ICD-10-CM | POA: Diagnosis not present

## 2022-02-14 DIAGNOSIS — F418 Other specified anxiety disorders: Secondary | ICD-10-CM | POA: Diagnosis not present

## 2022-02-14 DIAGNOSIS — G20C Parkinsonism, unspecified: Secondary | ICD-10-CM | POA: Diagnosis not present

## 2022-02-14 DIAGNOSIS — Z86010 Personal history of colonic polyps: Secondary | ICD-10-CM

## 2022-02-14 DIAGNOSIS — D519 Vitamin B12 deficiency anemia, unspecified: Secondary | ICD-10-CM | POA: Diagnosis not present

## 2022-02-14 DIAGNOSIS — E785 Hyperlipidemia, unspecified: Secondary | ICD-10-CM | POA: Diagnosis present

## 2022-02-14 DIAGNOSIS — R278 Other lack of coordination: Secondary | ICD-10-CM | POA: Diagnosis not present

## 2022-02-14 DIAGNOSIS — K5730 Diverticulosis of large intestine without perforation or abscess without bleeding: Secondary | ICD-10-CM | POA: Diagnosis present

## 2022-02-14 DIAGNOSIS — R2681 Unsteadiness on feet: Secondary | ICD-10-CM | POA: Diagnosis not present

## 2022-02-14 DIAGNOSIS — M1990 Unspecified osteoarthritis, unspecified site: Secondary | ICD-10-CM | POA: Diagnosis present

## 2022-02-14 DIAGNOSIS — R1312 Dysphagia, oropharyngeal phase: Secondary | ICD-10-CM | POA: Diagnosis not present

## 2022-02-14 DIAGNOSIS — K3189 Other diseases of stomach and duodenum: Secondary | ICD-10-CM | POA: Diagnosis not present

## 2022-02-14 DIAGNOSIS — R262 Difficulty in walking, not elsewhere classified: Secondary | ICD-10-CM | POA: Diagnosis not present

## 2022-02-14 DIAGNOSIS — M6281 Muscle weakness (generalized): Secondary | ICD-10-CM | POA: Diagnosis not present

## 2022-02-14 DIAGNOSIS — D500 Iron deficiency anemia secondary to blood loss (chronic): Secondary | ICD-10-CM | POA: Diagnosis not present

## 2022-02-14 DIAGNOSIS — R1310 Dysphagia, unspecified: Secondary | ICD-10-CM | POA: Diagnosis not present

## 2022-02-14 DIAGNOSIS — F419 Anxiety disorder, unspecified: Secondary | ICD-10-CM | POA: Diagnosis present

## 2022-02-14 DIAGNOSIS — R69 Illness, unspecified: Secondary | ICD-10-CM | POA: Diagnosis not present

## 2022-02-14 DIAGNOSIS — G9340 Encephalopathy, unspecified: Secondary | ICD-10-CM | POA: Diagnosis not present

## 2022-02-14 LAB — COMPREHENSIVE METABOLIC PANEL
ALT: 10 U/L (ref 0–44)
AST: 14 U/L — ABNORMAL LOW (ref 15–41)
Albumin: 4.2 g/dL (ref 3.5–5.0)
Alkaline Phosphatase: 44 U/L (ref 38–126)
Anion gap: 10 (ref 5–15)
BUN: 17 mg/dL (ref 8–23)
CO2: 20 mmol/L — ABNORMAL LOW (ref 22–32)
Calcium: 9.2 mg/dL (ref 8.9–10.3)
Chloride: 106 mmol/L (ref 98–111)
Creatinine, Ser: 0.46 mg/dL (ref 0.44–1.00)
GFR, Estimated: >60 mL/min (ref >60)
Glucose, Bld: 100 mg/dL — ABNORMAL HIGH (ref 70–99)
Potassium: 4 mmol/L (ref 3.5–5.1)
Sodium: 136 mmol/L (ref 135–145)
Total Bilirubin: 0.5 mg/dL (ref 0.3–1.2)
Total Protein: 6.9 g/dL (ref 6.5–8.1)

## 2022-02-14 LAB — CBC WITH DIFFERENTIAL/PLATELET
Abs Immature Granulocytes: 0.02 10*3/uL (ref 0.00–0.07)
Basophils Absolute: 0 10*3/uL (ref 0.0–0.1)
Basophils Relative: 0 %
Eosinophils Absolute: 0.2 10*3/uL (ref 0.0–0.5)
Eosinophils Relative: 3 %
HCT: 18.4 % — ABNORMAL LOW (ref 36.0–46.0)
Hemoglobin: 4.7 g/dL — CL (ref 12.0–15.0)
Immature Granulocytes: 0 %
Lymphocytes Relative: 25 %
Lymphs Abs: 1.2 10*3/uL (ref 0.7–4.0)
MCH: 15.3 pg — ABNORMAL LOW (ref 26.0–34.0)
MCHC: 25.5 g/dL — ABNORMAL LOW (ref 30.0–36.0)
MCV: 59.7 fL — ABNORMAL LOW (ref 80.0–100.0)
Monocytes Absolute: 0.5 10*3/uL (ref 0.1–1.0)
Monocytes Relative: 11 %
Neutro Abs: 2.8 10*3/uL (ref 1.7–7.7)
Neutrophils Relative %: 61 %
Platelets: 344 10*3/uL (ref 150–400)
RBC: 3.08 MIL/uL — ABNORMAL LOW (ref 3.87–5.11)
RDW: 17.5 % — ABNORMAL HIGH (ref 11.5–15.5)
WBC: 4.7 10*3/uL (ref 4.0–10.5)
nRBC: 0 % (ref 0.0–0.2)

## 2022-02-14 LAB — POC OCCULT BLOOD, ED: Fecal Occult Bld: POSITIVE — AB

## 2022-02-14 LAB — LACTATE DEHYDROGENASE: LDH: 101 U/L (ref 98–192)

## 2022-02-14 MED ORDER — ROSUVASTATIN CALCIUM 10 MG PO TABS
10.0000 mg | ORAL_TABLET | Freq: Every day | ORAL | Status: DC
  Administered 2022-02-16 – 2022-02-19 (×5): 10 mg via ORAL
  Filled 2022-02-14 (×7): qty 1

## 2022-02-14 MED ORDER — NUTRITIONAL SUPPLEMENT PO LIQD: 120.0000 mL | Freq: Two times a day (BID) | ORAL | Status: DC

## 2022-02-14 MED ORDER — ENSURE ENLIVE PO LIQD
237.0000 mL | Freq: Every day | ORAL | Status: DC
  Administered 2022-02-16 – 2022-02-20 (×4): 237 mL via ORAL
  Filled 2022-02-14: qty 237

## 2022-02-14 MED ORDER — RISPERIDONE 0.5 MG PO TABS
0.5000 mg | ORAL_TABLET | Freq: Two times a day (BID) | ORAL | Status: DC
  Administered 2022-02-15 – 2022-02-20 (×11): 0.5 mg via ORAL
  Filled 2022-02-14 (×13): qty 1

## 2022-02-14 NOTE — ED Provider Notes (Signed)
[name] Provider Note   CSN: [id number] Arrival date & time: 02/14/22  1609     History  Chief Complaint  Patient presents with   Abnormal Lab    [name] is a 81 y.o. female.  HPI Presents from nursing facility with concern of weakness.  Patient is a poor historian, also has poor hearing, and obtaining full details is difficult.  Seemingly she was sent from a nursing facility after evaluation now found her to have anemia.  Patient denies pain, denies vomiting, denies melena.  No report of these from her facility either.  Facility report notable for hemoglobin 4.3 on labs performed earlier today.    Home Medications Prior to Admission medications   Medication Sig Start Date End Date Taking? Authorizing Provider  acetaminophen (TYLENOL) 500 MG tablet Take 1,000 mg by mouth 2 (two) times daily.    [provider]  amLODipine (NORVASC) 10 MG tablet Take 1 tablet (10 mg total) by mouth at bedtime. Patient taking differently: Take 5 mg by mouth at bedtime. Hold for SBP < 120 12/10/20   Debbe Odea, MD  aspirin 81 MG chewable tablet Chew 1 tablet (81 mg total) by mouth daily. 12/10/20   Debbe Odea, MD  carvedilol (COREG) 6.25 MG tablet Take 1 tablet (6.25 mg total) by mouth 2 (two) times daily with a meal. 11/25/20   Jon Billings, NP  diclofenac Sodium (VOLTAREN) 1 % GEL Apply 2 g topically daily. Joints/ hand    [provider]  gabapentin (NEURONTIN) 100 MG capsule Take 1 capsule (100 mg total) by mouth at bedtime. Take 1 capsule at bedtime. If no relief, can go up to 2 tablets at bedtime after 3 days. Patient taking differently: Take 200 mg by mouth 2 (two) times daily. 09/09/20   McElwee, Lauren A, NP  Menthol, Topical Analgesic, (BIOFREEZE) 4 % GEL Apply 1 application topically daily. Apply to neck ,knees    [provider]  Multiple Vitamins-Minerals (DECUBI-VITE PO) Take 1 capsule by mouth daily.     [provider]  NON FORMULARY Take 120 mLs by mouth in the morning and at bedtime. House Supplement    [provider]  nystatin (MYCOSTATIN/NYSTOP) powder Apply 1 application topically 2 (two) times daily.    [provider]  OLANZapine (ZYPREXA) 10 MG tablet Take 10 mg by mouth at bedtime. 02/04/21   [provider]  OLANZapine (ZYPREXA) 2.5 MG tablet Take 2.5 mg by mouth daily. 02/04/21   [provider]  polyethylene glycol (MIRALAX / GLYCOLAX) 17 g packet Take 17 g by mouth daily. 12/28/20   Wouk, Ailene Rud, MD  rosuvastatin (CRESTOR) 10 MG tablet Take 1 tablet (10 mg total) by mouth daily. 11/25/20   Jon Billings, NP  sennosides-docusate sodium (SENOKOT-S) 8.6-50 MG tablet Take 1 tablet by mouth in the morning and at bedtime.    [provider]  tiZANidine (ZANAFLEX) 4 MG tablet Take 1 tablet (4 mg total) by mouth every 8 (eight) hours as needed for muscle spasms. 12/10/20   Debbe Odea, MD  traMADol (ULTRAM) 50 MG tablet Take 1 tablet (50 mg total) by mouth every 6 (six) hours as needed for moderate pain (mild pain). 12/13/20   Debbe Odea, MD  triamcinolone cream (KENALOG) 0.1 % Apply 1 application topically 2 (two) times daily. 10/12/20   McElwee, Scheryl Darter, NP      Allergies    Penicillins and Penicillins    Review of  Systems   Review of Systems  Unable to perform ROS: Other  Extremely poor hearing, however, review of systems seemingly otherwise normal.  Physical Exam Updated Vital Signs BP 125/63 (BP Location: Left Arm)   Pulse 78   Temp 98.2 F (36.8 C) (Oral)   Resp 20   SpO2 100%  Physical Exam Vitals and nursing note reviewed.  Constitutional:      General: She is not in acute distress.    Appearance: She is well-developed.     Comments: Deconditioned appearing elderly female speaking clearly.  Very poor hearing.  HENT:     Head: Normocephalic and atraumatic.  Eyes:     Conjunctiva/sclera: Conjunctivae  normal.  Cardiovascular:     Rate and Rhythm: Normal rate and regular rhythm.  Pulmonary:     Effort: Pulmonary effort is normal. No respiratory distress.     Breath sounds: Normal breath sounds. No stridor.  Abdominal:     General: There is no distension.  Skin:    General: Skin is warm and dry.     Coloration: Skin is pale.     Comments: Erythematous skin superior to the gluteal region, no obvious breakdown  Neurological:     Mental Status: She is alert and oriented to person, place, and time.     Comments: Extremely poor hearing, cranial nerves otherwise unremarkable.  There is age-appropriate atrophy, no tremor, no notable deficit.  Psychiatric:        Mood and Affect: Mood normal.     ED Results / Procedures / Treatments   Labs (all labs ordered are listed, but only abnormal results are displayed) Labs Reviewed  POC OCCULT BLOOD, ED - Abnormal; Notable for the following components:      Result Value   Fecal Occult Bld POSITIVE (*)    All other components within normal limits  COMPREHENSIVE METABOLIC PANEL  CBC WITH DIFFERENTIAL/PLATELET  LACTATE DEHYDROGENASE  TYPE AND SCREEN    EKG None  Radiology No results found.  Procedures Procedures    Medications Ordered in ED Medications - No data to display  ED Course/ Medical Decision Making/ A&P This patient with a Hx of anemia, schizophrenia, htn, not anticoagulated presents to the ED for concern of weakness, pallor, anemia, this involves an extensive number of treatment options, and is a complaint that carries with it a high risk of complications and morbidity.    The differential diagnosis includes bleed, hemolytic syndrome, anemia secondary to malnutrition/anorexia.,  Infection   Social Determinants of Health:  Age, schizophrenia  Additional history obtained:  Additional history and/or information obtained from chart review, notable for as above nursing notes suggest anemia of 4.3   After the initial  evaluation, orders, including: Labs type and screen Hemoccult were initiated.   Patient placed on Cardiac and Pulse-Oximetry Monitors. The patient was maintained on a cardiac monitor.  The cardiac monitored showed an rhythm of 80 sinus normal The patient was also maintained on pulse oximetry. The readings were typically 100% room air normal   On repeat evaluation of the patient stayed the same  Lab Tests:  I personally interpreted labs.  The pertinent results include:  hemeoccult pos, anemia w HGB 4.7  Consultations Obtained:  I requested consultation with the GI,  and discussed lab and imaging findings as well as pertinent plan - they recommend: Dr. Watt Climes says GI will see in the AM  Dispostion / Final MDM:  After consideration of the diagnostic results and the patient's response to  treatment, patient is initiating blood transfusion for symptomatic anemia.  She is Hemoccult positive, grossly negative, essentially has no complaints beyond weakness.  No evidence for infection some suspicion for chronic GI blood loss.  Patient admitted to the hospitalist team with GI follow as a consulting service.  Final Clinical Impression(s) / ED Diagnoses Final diagnoses:  Symptomatic anemia  Occult GI bleeding  CRITICAL CARE Performed by: Carmin Muskrat Total critical care time: 35 minutes Critical care time was exclusive of separately billable procedures and treating other patients. Critical care was necessary to treat or prevent imminent or life-threatening deterioration. Critical care was time spent personally by me on the following activities: development of treatment plan with patient and/or surrogate as well as nursing, discussions with consultants, evaluation of patient's response to treatment, examination of patient, obtaining history from patient or surrogate, ordering and performing treatments and interventions, ordering and review of laboratory studies, ordering and review of radiographic  studies, pulse oximetry and re-evaluation of patient's condition.    Carmin Muskrat, MD 02/14/22 2025

## 2022-02-14 NOTE — Assessment & Plan Note (Signed)
Hold all antihypertensives while she is volume depleted

## 2022-02-14 NOTE — Assessment & Plan Note (Signed)
-  Hgb of 4.7 on presentation with +FOBT. Appears only on aspirin daily.  - Had remote colonoscopy in 2009 with adenomatous polyp sigmoid and diverticulosis. -has 2u pRBC ordered, however due to antibodies found they will need to be transported from [place]. She remains hemodynamically stable and no active bleed at this time -GI Dr. Watt Climes was consulted and will see in the morning. Keep NPO midnight.

## 2022-02-14 NOTE — ED Triage Notes (Signed)
Pt bib GCEDMS from [facility] d/t Hgb of 4.9.  Per staff labs were drawn this AM reveling low Hgb however pt has no symptoms. Pt denies pain.

## 2022-02-14 NOTE — ED Notes (Signed)
Pt does have antibodies, blood will need to be brought in per blood bank.  2 units ordered by blood bank in anticipation of transfusion.

## 2022-02-14 NOTE — Assessment & Plan Note (Signed)
-  hold home aspirin due to active bleed

## 2022-02-14 NOTE — H&P (Signed)
History and Physical    Patient: [name] LAG:[id number] DOB: Aug 14, 1940 DOA: 02/14/2022 DOS: the patient was seen and examined on 02/14/2022 PCP: Jon Billings, NP  Patient coming from: [facility]  Chief Complaint:  Chief Complaint  Patient presents with   Abnormal Lab   HPI: [name] is a 81 y.o. female with medical history significant of hypertension, aortic valve stenosis, CVA, schizoaffective disorder, hyperlipidemia, anxiety and depression who presents with anemia.  Patient unable to oriented x 3 but unable to provide much history. Reportedly per ED documentation had labs drawn at [facility] with hemoglobin of 4.9.  Repeat hemoglobin in the ED of 4.7 with prior baseline of around 8-9.  FOBT is positive.  Creatinine is stable at 0.46 with BUN of 17. No complaints of abdomen pain or dark stool. No nausea, vomiting or diarrhea.  ED physician discussed with GI Dr. Watt Climes who will see in consultation in the morning.Last recorded colonoscopy in 2009 with adenomatous polyp sigmoid diverticulosis. 2u pRBC was ordered Review of Systems: As mentioned in the history of present illness. All other systems reviewed and are negative. Past Medical History:  Diagnosis Date   Anemia    Colon polyp    History of small bowel obstruction 9/09   likely due to adhesions- pt refused most dx and tx in hospital   History of vaginal bleeding    post menopausal- gyn eval, ? polyp   Hyperlipidemia    Hypertension    Osteoarthritis    knee   Schizoaffective disorder    paranoid personality- refuses treatment   Stroke ([place])    Vitamin D deficiency    Past Surgical History:  Procedure Laterality Date   ANTERIOR AND POSTERIOR REPAIR N/A 04/23/2012   Procedure: ANTERIOR   REPAIR CYSTOCELE;  Surgeon: Reece Packer, MD;  Location: [facility];  Service: Urology;  Laterality: N/A;  cysto;graft 10x6   APPENDECTOMY     bladder tack  1976   BOWEL RESECTION N/A 12/02/2020    Procedure: SMALL BOWEL RESECTION;  Surgeon: Benjamine Sprague, DO;  Location: [facility];  Service: General;  Laterality: N/A;   INGUINAL HERNIA REPAIR Right 12/02/2020   Procedure: HERNIA REPAIR INGUINAL ADULT;  Surgeon: Benjamine Sprague, DO;  Location: [facility];  Service: General;  Laterality: Right;   KNEE ARTHROSCOPY     right   KNEE ARTHROSCOPY Right    KNEE CLOSED REDUCTION Right 12/07/2020   Procedure: CLOSED MANIPULATION KNEE;  Surgeon: Lovell Sheehan, MD;  Location: [facility];  Service: Orthopedics;  Laterality: Right;   KNEE SURGERY Right    LAPAROSCOPY  1970's   twisted fallopian tube   SALPINGOOPHORECTOMY Bilateral 04/23/2012   Procedure: SALPINGO OOPHORECTOMY;  Surgeon: Emily Filbert, MD;  Location: [facility];  Service: Gynecology;  Laterality: Bilateral;   TONSILLECTOMY     TOTAL KNEE REVISION Right 12/24/2020   Procedure: TOTAL KNEE REVISION;  Surgeon: Lovell Sheehan, MD;  Location: [facility];  Service: Orthopedics;  Laterality: Right;   VAGINAL HYSTERECTOMY N/A 04/23/2012   Procedure: HYSTERECTOMY VAGINAL;  Surgeon: Emily Filbert, MD;  Location: [facility];  Service: Gynecology;  Laterality: N/A;   VAGINAL PROLAPSE REPAIR N/A 04/23/2012   Procedure: VAGINAL VAULT SUSPENSION;  Surgeon: Reece Packer, MD;  Location: [facility];  Service: Urology;  Laterality: N/A;   Social History:  reports that she quit smoking about 39 years ago. Her smoking use included cigarettes. She has a 10.00 pack-year smoking history. She has never  used smokeless tobacco. She reports that she does not drink alcohol and does not use drugs.  Allergies  Allergen Reactions   Penicillins Rash and Other (See Comments)    TOLERATED CEFAZOLIN    Family History  Problem Relation Age of Onset   Heart disease Mother    Diabetes Mother    Skin cancer Mother    Diabetes Father    Heart disease Father    Hypertension Father    Diabetes Brother    Arthritis Brother    Hypertension Brother    Hyperlipidemia Brother    Arthritis  Daughter    Cancer Son    Obesity Daughter     Prior to Admission medications   Medication Sig Start Date End Date Taking? Authorizing Provider  acetaminophen (TYLENOL) 500 MG tablet Take 1,000 mg by mouth 2 (two) times daily.    [provider]  amLODipine (NORVASC) 10 MG tablet Take 1 tablet (10 mg total) by mouth at bedtime. Patient taking differently: Take 5 mg by mouth at bedtime. Hold for SBP < 120 12/10/20   Debbe Odea, MD  aspirin 81 MG chewable tablet Chew 1 tablet (81 mg total) by mouth daily. 12/10/20   Debbe Odea, MD  carvedilol (COREG) 6.25 MG tablet Take 1 tablet (6.25 mg total) by mouth 2 (two) times daily with a meal. 11/25/20   Jon Billings, NP  diclofenac Sodium (VOLTAREN) 1 % GEL Apply 2 g topically daily. Joints/ hand    [provider]  gabapentin (NEURONTIN) 100 MG capsule Take 1 capsule (100 mg total) by mouth at bedtime. Take 1 capsule at bedtime. If no relief, can go up to 2 tablets at bedtime after 3 days. Patient taking differently: Take 200 mg by mouth 2 (two) times daily. 09/09/20   McElwee, Lauren A, NP  Menthol, Topical Analgesic, (BIOFREEZE) 4 % GEL Apply 1 application topically daily. Apply to neck ,knees    [provider]  Multiple Vitamins-Minerals (DECUBI-VITE PO) Take 1 capsule by mouth daily.    [provider]  NON FORMULARY Take 120 mLs by mouth in the morning and at bedtime. House Supplement    [provider]  nystatin (MYCOSTATIN/NYSTOP) powder Apply 1 application topically 2 (two) times daily.    [provider]  OLANZapine (ZYPREXA) 10 MG tablet Take 10 mg by mouth at bedtime. 02/04/21   [provider]  OLANZapine (ZYPREXA) 2.5 MG tablet Take 2.5 mg by mouth daily. 02/04/21   [provider]  polyethylene glycol (MIRALAX / GLYCOLAX) 17 g packet Take 17 g by mouth daily. 12/28/20   Wouk, Ailene Rud, MD  rosuvastatin (CRESTOR) 10 MG tablet Take 1 tablet (10 mg total) by  mouth daily. 11/25/20   Jon Billings, NP  sennosides-docusate sodium (SENOKOT-S) 8.6-50 MG tablet Take 1 tablet by mouth in the morning and at bedtime.    [provider]  tiZANidine (ZANAFLEX) 4 MG tablet Take 1 tablet (4 mg total) by mouth every 8 (eight) hours as needed for muscle spasms. 12/10/20   Debbe Odea, MD  traMADol (ULTRAM) 50 MG tablet Take 1 tablet (50 mg total) by mouth every 6 (six) hours as needed for moderate pain (mild pain). 12/13/20   Debbe Odea, MD  triamcinolone cream (KENALOG) 0.1 % Apply 1 application topically 2 (two) times daily. 10/12/20   Charyl Dancer, NP    Physical Exam: Vitals:   02/14/22 1945 02/14/22 2000 02/14/22 2245 02/14/22 2300  BP: (!) 154/55  Marland Kitchen)  119/44 (!) 129/52  Pulse: 78  71 71  Resp: (!) '28  18 15  '$ Temp:  98.2 F (36.8 C)    TempSrc:      SpO2: 97%  99% 98%   Constitutional: NAD, calm, comfortable, elderly female laying upright in bed Eyes: lids and conjunctivae normal ENMT: Mucous membranes are dry.   Neck: normal, supple Respiratory: clear to auscultation bilaterally, no wheezing, no crackles. Normal respiratory effort.   Cardiovascular: Regular rate and rhythm, no murmurs / rubs / gallops. No extremity edema.   Abdomen: Soft, nondistended no tenderness,  Bowel sounds positive.  Musculoskeletal: no clubbing / cyanosis. No joint deformity upper and lower extremities. Good ROM, no contractures. Normal muscle tone.  Skin: no rashes, lesions, ulcers.  Neurologic: CN 2-12 grossly intact.  Strength 5/5 in all 4.  Psychiatric: Alert and oriented x 3.  Limited historian.  Normal mood. Data Reviewed:  See HPI  Assessment and Plan: * Acute GI bleeding -Hgb of 4.7 on presentation with +FOBT. Appears only on aspirin daily.  - Had remote colonoscopy in 2009 with adenomatous polyp sigmoid and diverticulosis. -has 2u pRBC ordered, however due to antibodies found they will need to be transported from [place]. She remains  hemodynamically stable and no active bleed at this time -GI Dr. Watt Climes was consulted and will see in the morning. Keep NPO midnight.  Schizoaffective disorder (HCC) Stable mood.  History of CVA (cerebrovascular accident) -hold home aspirin due to active bleed  Essential hypertension Hold all antihypertensives while she is volume depleted      Advance Care Planning: DNR-confirmed with daughter  Consults: GI Dr. Watt Climes  Family Communication: Discussed with daughter Romelle Starcher over the phone. Pt requested I only call Romelle Starcher and no other family members.   Severity of Illness: The appropriate patient status for this patient is INPATIENT. Inpatient status is judged to be reasonable and necessary in order to provide the required intensity of service to ensure the patient's safety. The patient's presenting symptoms, physical exam findings, and initial radiographic and laboratory data in the context of their chronic comorbidities is felt to place them at high risk for further clinical deterioration. Furthermore, it is not anticipated that the patient will be medically stable for discharge from the hospital within 2 midnights of admission.   * I certify that at the point of admission it is my clinical judgment that the patient will require inpatient hospital care spanning beyond 2 midnights from the point of admission due to high intensity of service, high risk for further deterioration and high frequency of surveillance required.*  Author: Orene Desanctis, DO 02/14/2022 11:06 PM  For on call review [email or web address].

## 2022-02-14 NOTE — ED Notes (Signed)
Consent for blood transfusion obtained from pt.

## 2022-02-14 NOTE — Assessment & Plan Note (Signed)
Stable mood 

## 2022-02-15 DIAGNOSIS — D500 Iron deficiency anemia secondary to blood loss (chronic): Secondary | ICD-10-CM | POA: Diagnosis not present

## 2022-02-15 DIAGNOSIS — D649 Anemia, unspecified: Principal | ICD-10-CM | POA: Diagnosis present

## 2022-02-15 DIAGNOSIS — R1310 Dysphagia, unspecified: Secondary | ICD-10-CM

## 2022-02-15 DIAGNOSIS — D509 Iron deficiency anemia, unspecified: Secondary | ICD-10-CM

## 2022-02-15 DIAGNOSIS — R195 Other fecal abnormalities: Secondary | ICD-10-CM

## 2022-02-15 DIAGNOSIS — K922 Gastrointestinal hemorrhage, unspecified: Secondary | ICD-10-CM | POA: Diagnosis not present

## 2022-02-15 LAB — CBC
HCT: 18.3 % — ABNORMAL LOW (ref 36.0–46.0)
HCT: 25.8 % — ABNORMAL LOW (ref 36.0–46.0)
Hemoglobin: 4.8 g/dL — CL (ref 12.0–15.0)
Hemoglobin: 7.6 g/dL — ABNORMAL LOW (ref 12.0–15.0)
MCH: 15.5 pg — ABNORMAL LOW (ref 26.0–34.0)
MCH: 19.5 pg — ABNORMAL LOW (ref 26.0–34.0)
MCHC: 26.2 g/dL — ABNORMAL LOW (ref 30.0–36.0)
MCHC: 29.5 g/dL — ABNORMAL LOW (ref 30.0–36.0)
MCV: 59 fL — ABNORMAL LOW (ref 80.0–100.0)
MCV: 66.3 fL — ABNORMAL LOW (ref 80.0–100.0)
Platelets: 325 10*3/uL (ref 150–400)
Platelets: 287 10*3/uL (ref 150–400)
RBC: 3.1 MIL/uL — ABNORMAL LOW (ref 3.87–5.11)
RBC: 3.89 MIL/uL (ref 3.87–5.11)
RDW: 17.7 % — ABNORMAL HIGH (ref 11.5–15.5)
RDW: 25.4 % — ABNORMAL HIGH (ref 11.5–15.5)
WBC: 5.4 10*3/uL (ref 4.0–10.5)
WBC: 4.5 10*3/uL (ref 4.0–10.5)
nRBC: 0 % (ref 0.0–0.2)
nRBC: 0 % (ref 0.0–0.2)

## 2022-02-15 LAB — BASIC METABOLIC PANEL
Anion gap: 8 (ref 5–15)
BUN: 13 mg/dL (ref 8–23)
CO2: 22 mmol/L (ref 22–32)
Calcium: 9 mg/dL (ref 8.9–10.3)
Chloride: 104 mmol/L (ref 98–111)
Creatinine, Ser: 0.4 mg/dL — ABNORMAL LOW (ref 0.44–1.00)
GFR, Estimated: >60 mL/min (ref >60)
Glucose, Bld: 86 mg/dL (ref 70–99)
Potassium: 3.8 mmol/L (ref 3.5–5.1)
Sodium: 134 mmol/L — ABNORMAL LOW (ref 135–145)

## 2022-02-15 LAB — IRON AND TIBC
Iron: 8 ug/dL — ABNORMAL LOW (ref 28–170)
Saturation Ratios: 2 % — ABNORMAL LOW (ref 10.4–31.8)
TIBC: 514 ug/dL — ABNORMAL HIGH (ref 250–450)
UIBC: 506 ug/dL

## 2022-02-15 LAB — PREPARE RBC (CROSSMATCH)

## 2022-02-15 LAB — FERRITIN: Ferritin: 2 ng/mL — ABNORMAL LOW (ref 11–307)

## 2022-02-15 MED ORDER — FAMOTIDINE IN NACL 20-0.9 MG/50ML-% IV SOLN
20.0000 mg | Freq: Once | INTRAVENOUS | Status: AC
  Administered 2022-02-15: 20 mg via INTRAVENOUS
  Filled 2022-02-15: qty 50

## 2022-02-15 MED ORDER — BISACODYL 5 MG PO TBEC
20.0000 mg | DELAYED_RELEASE_TABLET | Freq: Once | ORAL | Status: AC
  Administered 2022-02-16: 20 mg via ORAL
  Filled 2022-02-15: qty 4

## 2022-02-15 MED ORDER — DIPHENHYDRAMINE HCL 50 MG/ML IJ SOLN
12.5000 mg | Freq: Once | INTRAMUSCULAR | Status: AC
  Administered 2022-02-15: 12.5 mg via INTRAVENOUS
  Filled 2022-02-15: qty 1

## 2022-02-15 MED ORDER — BISACODYL 5 MG PO TBEC
10.0000 mg | DELAYED_RELEASE_TABLET | Freq: Once | ORAL | Status: AC
  Administered 2022-02-15: 10 mg via ORAL
  Filled 2022-02-15: qty 2

## 2022-02-15 MED ORDER — SODIUM CHLORIDE 0.9% IV SOLUTION: Freq: Once | INTRAVENOUS | Status: AC

## 2022-02-15 MED ORDER — SODIUM CHLORIDE 0.9 % IV SOLN
250.0000 mg | Freq: Every day | INTRAVENOUS | Status: AC
  Administered 2022-02-16 – 2022-02-18 (×4): 250 mg via INTRAVENOUS
  Filled 2022-02-15 (×4): qty 20

## 2022-02-15 MED ORDER — POLYETHYLENE GLYCOL 3350 17 G PO PACK
17.0000 g | PACK | Freq: Once | ORAL | Status: AC
  Administered 2022-02-15: 17 g via ORAL
  Filled 2022-02-15: qty 1

## 2022-02-15 MED ORDER — PANTOPRAZOLE SODIUM 40 MG PO TBEC
40.0000 mg | DELAYED_RELEASE_TABLET | Freq: Every day | ORAL | Status: DC
  Administered 2022-02-15 – 2022-02-17 (×3): 40 mg via ORAL
  Filled 2022-02-15 (×3): qty 1

## 2022-02-15 NOTE — Progress Notes (Signed)
PROGRESS NOTE  [name] ZWC:[id number] DOB: [birth year] DOA: 02/14/2022 PCP: Patricia Billings, NP  HPI/Recap of past 24 hours: [name] is a 81 y.o. female with medical history significant of HTN, aortic valve stenosis, CVA, schizoaffective disorder, HLD, anxiety and depression who presents with anemia. Per ED documentation had labs drawn at [facility] with hemoglobin of 4.9.  Repeat hemoglobin in the ED of 4.7 with prior baseline of around 8-9.  FOBT positive.  Last recorded colonoscopy in 2009 with adenomatous polyp sigmoid diverticulosis. In the ED, patient received 2 units of PRBC.  GI consulted.  Patient admitted for further management.   Today, patient denies any new complaints, requesting to be fed as her diet was just upgraded to soft.  Some neuropathic pain on bilateral feet.  Daughter at bedside.  All questions answered.  Very hard of hearing    Assessment/Plan: Principal Problem:   Acute GI bleeding Active Problems:   Schizoaffective disorder (HCC)   Essential hypertension   History of CVA (cerebrovascular accident)   Acute on chronic microcytic anemia/iron deficiency Possible GI bleeding Hgb of 4.7 on presentation with +FOBT Had remote colonoscopy in 2009 with adenomatous polyp sigmoid and diverticulosis Anemia panel showed iron 8, sats 2, ferritin 2, TIBC 514 S/p 2 units of PRBC, repeat hemoglobin pending Start IV iron GI consulted, plan for EGD and colonoscopy on 12/8 as well as possible esophageal dilatation due to history of some intermittent solid food dysphagia PPI Frequent CBC   Schizoaffective disorder ([place]) Stable mood   History of CVA Hold home aspirin   Essential hypertension Hold all antihypertensives for now       Code Status: DNR  Family Communication: Discussed with daughter at bedside  Disposition Plan: Status is: Inpatient Remains inpatient appropriate because: Level of  care      Consultants: GI  Procedures: None  Antimicrobials: None  DVT prophylaxis: SCD   Objective: Vitals:   02/15/22 0811 02/15/22 1115 02/15/22 1200 02/15/22 1300  BP: (!) 158/56 (!) 145/53  (!) 133/53  Pulse: 66 64  63  Resp: '18 20  15  '$ Temp: 97.8 F (36.6 C)  97.8 F (36.6 C)   TempSrc: Axillary     SpO2: 98% 97%  98%    Exam: General: NAD, very hard of hearing, oriented x 3 Cardiovascular: S1, S2 present Respiratory: CTAB Abdomen: Soft, nontender, nondistended, bowel sounds present Musculoskeletal: No bilateral pedal edema noted Skin: Normal Psychiatry: Normal mood     Data Reviewed: CBC: Recent Labs  Lab 02/14/22 1652 02/15/22 0300  WBC 4.7 5.4  NEUTROABS 2.8  --   HGB 4.7* 4.8*  HCT 18.4* 18.3*  MCV 59.7* 59.0*  PLT 344 235   Basic Metabolic Panel: Recent Labs  Lab 02/14/22 1652 02/15/22 0300  NA 136 134*  K 4.0 3.8  CL 106 104  CO2 20* 22  GLUCOSE 100* 86  BUN 17 13  CREATININE 0.46 0.40*  CALCIUM 9.2 9.0   GFR: CrCl cannot be calculated (Unknown ideal weight.). Liver Function Tests: Recent Labs  Lab 02/14/22 1652  AST 14*  ALT 10  ALKPHOS 44  BILITOT 0.5  PROT 6.9  ALBUMIN 4.2   No results for input(s): "LIPASE", "AMYLASE" in the last 168 hours. No results for input(s): "AMMONIA" in the last 168 hours. Coagulation Profile: No results for input(s): "INR", "PROTIME" in the last 168 hours. Cardiac Enzymes: No results for input(s): "CKTOTAL", "CKMB", "CKMBINDEX", "TROPONINI" in the last 168 hours. BNP (last  3 results) No results for input(s): "PROBNP" in the last 8760 hours. HbA1C: No results for input(s): "HGBA1C" in the last 72 hours. CBG: No results for input(s): "GLUCAP" in the last 168 hours. Lipid Profile: No results for input(s): "CHOL", "HDL", "LDLCALC", "TRIG", "CHOLHDL", "LDLDIRECT" in the last 72 hours. Thyroid Function Tests: No results for input(s): "TSH", "T4TOTAL", "FREET4", "T3FREE", "THYROIDAB"  in the last 72 hours. Anemia Panel: Recent Labs    02/15/22 0300  FERRITIN 2*  TIBC 514*  IRON 8*   Urine analysis:    Component Value Date/Time   COLORURINE YELLOW 02/15/2021 0541   APPEARANCEUR CLEAR 02/15/2021 0541   APPEARANCEUR Cloudy (A) 08/31/2020 1039   LABSPEC 1.020 02/15/2021 0541   PHURINE 6.0 02/15/2021 0541   GLUCOSEU NEGATIVE 02/15/2021 0541   HGBUR NEGATIVE 02/15/2021 0541   BILIRUBINUR NEGATIVE 02/15/2021 0541   BILIRUBINUR Negative 08/31/2020 1039   KETONESUR NEGATIVE 02/15/2021 0541   PROTEINUR NEGATIVE 02/15/2021 0541   UROBILINOGEN 0.2 06/25/2008 1242   NITRITE NEGATIVE 02/15/2021 0541   LEUKOCYTESUR MODERATE (A) 02/15/2021 0541   Sepsis Labs: '@LABRCNTIP'$ (procalcitonin:4,lacticidven:4)  )No results found for this or any previous visit (from the past 240 hour(s)).    Studies: No results found.  Scheduled Meds:  [START ON 02/16/2022] bisacodyl  20 mg Oral Once   feeding supplement  237 mL Oral Daily   risperiDONE  0.5 mg Oral BID   rosuvastatin  10 mg Oral QHS    Continuous Infusions:   LOS: 1 day     Alma Friendly, MD [facility]  If 7PM-7AM, please contact night-coverage [email or web address] 02/15/2022, 2:06 PM

## 2022-02-15 NOTE — ED Notes (Signed)
patient reported "swelling or burning of tongue" (post food ingestion) This is prior to PRBCs    Benadryl and Pepcid order prophylactic  Patient able to drink  MD made aware  New orders provided.

## 2022-02-15 NOTE — Progress Notes (Addendum)
       Overnight   NAME: [name] MRN: [id number] DOB : 1941-01-22    Date of Service   02/15/2022   HPI/Events of Note   Notified by RN that ordered blood from remote source is available on site.    Interventions/ Plan   Order in for 2 units PRBCs as noted in Admission      While in ER notified by RN for concern for some possible patient reported "swelling or burning of tongue" (post food ingestion) This is prior to PRBCs   Benadryl and Pepcid order prophylactic No airway compromise at this time and no obvious or stated distress.       Gershon Cull BSN MSNA MSN ACNPC-AG Acute Care Nurse Practitioner [place]

## 2022-02-15 NOTE — ED Notes (Signed)
Let RN know of pt b/p 148/45

## 2022-02-15 NOTE — ED Notes (Addendum)
Pt wanted to wash up assisted with a bed bath

## 2022-02-15 NOTE — Consult Note (Addendum)
Consultation Note   Referring Provider:  [facility] PCP: Jon Billings, NP Primary Gastroenterologist: Silvano Rusk, MD  Reason for consultation: Heme + anemia  Hospital Day: 2  Assessment    Patient profile:  [name] is a 81 y.o. female with a past medical history significant for adenomatous colon polyps, diverticulosis, hypertension, aortic valve stenosis, CVA, schizoaffective disorder, hyperlipidemia, anxiety and depression    . See PMH for any additional medical problems. Admitted today with profound anemia found on outpatient labs.   # Severe acute on chronic microcytic anemia and FOBT+. . Hgb one year ago was 9, down to 4.8 now. Suspect iron deficiency. She was iron deficient in April 2022. Probably treated with iron as follow up ferritin in Aug 2022 was 85. Worsening anemia possibly secondary to PUD in setting of NSAIDS. Other considerations include gastrointestinal neoplasm, AVMs.   # ? History of constipation. Miralax and senokot on home med list  # History of CVA, home asa currently on hold  # See PMH for additional medical problems  Plan   Will obtain iron studies. Suspect she will need IV iron  Lab Results  Component Value Date   FERRITIN 2 (L) 02/15/2022     PPI Q 24 hours Can have full liquids. No plans for procedures today but will need to decide about EGD +/- colonoscopy.. She is HOH but seems able to make decisions about care.   She has received 2 u PRBCs, follow up hgb not done. Will get repeat CBC at 1300 then Q 8 hours x 4.   ------------------------------------------------------------------------------------------------------------------------------------------------------------ GI attending:  I have also seen and evaluated this patient and have obtained a history of some intermittent solid food dysphagia.  She has an iron deficiency anemia and upper GI endoscopy and colonoscopy is  appropriate, possible esophageal dilation as well.  I have explained the risks benefits and indications and she understands and agrees to proceed.  This will be scheduled for Friday afternoon December 8.  She has features of parkinsonism   I see a pill-rolling tremor and masked facies and bradykinesia.  She is alert and oriented x 3 and competent for decision-making based upon my interview.  In the meantime I have started some bisacodyl pills added some MiraLAX as well.  We will start a movie prep colonoscopy prep tomorrow (this will be ordered tomorrow).  Await response to transfusion.  Gatha Mayer, MD, [facility] See Shea Evans on call - gastroenterology for best contact person 02/15/2022 11:18 AM    HPI   Patient had a colonoscopy by [place] in 2009 with removal of two small adenomas, no colonoscopy since as far as I can tell. She was sent to ED from [facility] yesterday for hgb of 4.9. She tells me she hasn't seen any blood in her stool and no one at [facility] has told her that they have seen any. She denies abdominal pain. No nausea / vomiting. She tells me she fells okay.   Takes a daily baby asa, no other NSAIDs  Significant studies:    Hgb 4.8 at 3 am today. MCV 59 INR 1.1 Alb 3.2, liver chemistries o/w normal.  Normal renal function  FOBT+   Previous GI Evaluation  Recent Labs and Imaging No results found.  Labs:  Recent Labs    02/14/22 1652 02/15/22 0300  WBC 4.7 5.4  HGB 4.7* 4.8*  HCT 18.4* 18.3*  PLT 344 325   Recent Labs    02/14/22 1652 02/15/22 0300  NA 136 134*  K 4.0 3.8  CL 106 104  CO2 20* 22  GLUCOSE 100* 86  BUN 17 13  CREATININE 0.46 0.40*  CALCIUM 9.2 9.0   Recent Labs    02/14/22 1652  PROT 6.9  ALBUMIN 4.2  AST 14*  ALT 10  ALKPHOS 30  BILITOT 0.5    Past Medical History:  Diagnosis Date   Anemia    Colon polyp    History of small bowel obstruction 9/09   likely due to adhesions- pt refused most  dx and tx in hospital   History of vaginal bleeding    post menopausal- gyn eval, ? polyp   Hyperlipidemia    Hypertension    Osteoarthritis    knee   Schizoaffective disorder    paranoid personality- refuses treatment   Stroke ([place])    Vitamin D deficiency     Past Surgical History:  Procedure Laterality Date   ANTERIOR AND POSTERIOR REPAIR N/A 04/23/2012   Procedure: ANTERIOR   REPAIR CYSTOCELE;  Surgeon: Reece Packer, MD;  Location: [facility];  Service: Urology;  Laterality: N/A;  cysto;graft 10x6   APPENDECTOMY     bladder tack  1976   BOWEL RESECTION N/A 12/02/2020   Procedure: SMALL BOWEL RESECTION;  Surgeon: Benjamine Sprague, DO;  Location: [facility];  Service: General;  Laterality: N/A;   INGUINAL HERNIA REPAIR Right 12/02/2020   Procedure: HERNIA REPAIR INGUINAL ADULT;  Surgeon: Benjamine Sprague, DO;  Location: [facility];  Service: General;  Laterality: Right;   KNEE ARTHROSCOPY     right   KNEE ARTHROSCOPY Right    KNEE CLOSED REDUCTION Right 12/07/2020   Procedure: CLOSED MANIPULATION KNEE;  Surgeon: Lovell Sheehan, MD;  Location: [facility];  Service: Orthopedics;  Laterality: Right;   KNEE SURGERY Right    LAPAROSCOPY  1970's   twisted fallopian tube   SALPINGOOPHORECTOMY Bilateral 04/23/2012   Procedure: SALPINGO OOPHORECTOMY;  Surgeon: Emily Filbert, MD;  Location: [facility];  Service: Gynecology;  Laterality: Bilateral;   TONSILLECTOMY     TOTAL KNEE REVISION Right 12/24/2020   Procedure: TOTAL KNEE REVISION;  Surgeon: Lovell Sheehan, MD;  Location: [facility];  Service: Orthopedics;  Laterality: Right;   VAGINAL HYSTERECTOMY N/A 04/23/2012   Procedure: HYSTERECTOMY VAGINAL;  Surgeon: Emily Filbert, MD;  Location: [facility];  Service: Gynecology;  Laterality: N/A;   VAGINAL PROLAPSE REPAIR N/A 04/23/2012   Procedure: VAGINAL VAULT SUSPENSION;  Surgeon: Reece Packer, MD;  Location: [facility];  Service: Urology;  Laterality: N/A;    Family History  Problem Relation Age of Onset    Heart disease Mother    Diabetes Mother    Skin cancer Mother    Diabetes Father    Heart disease Father    Hypertension Father    Diabetes Brother    Arthritis Brother    Hypertension Brother    Hyperlipidemia Brother    Arthritis Daughter    Cancer Son    Obesity Daughter     Prior to Admission medications   Medication Sig Start Date End Date Taking? Authorizing Provider  acetaminophen (TYLENOL) 500 MG tablet Take 1,000 mg by mouth 2 (two) times daily.  Yes [provider]  amLODipine (NORVASC) 5 MG tablet Take 5 mg by mouth See admin instructions. Take 5 mg by mouth once a day and hold for a Systolic reading <671   Yes [provider]  Artificial Saliva (BIOTENE DRY MOUTH MOISTURIZING) SOLN Use as directed 1 spray in the mouth or throat every other day.   Yes [provider]  aspirin 81 MG chewable tablet Chew 1 tablet (81 mg total) by mouth daily. 12/10/20  Yes Debbe Odea, MD  carvedilol (COREG) 6.25 MG tablet Take 1 tablet (6.25 mg total) by mouth 2 (two) times daily with a meal. Patient taking differently: Take 6.25 mg by mouth 2 (two) times daily as needed (for Systolic reading >245 and/or Diastolic reading >809). 11/25/20  Yes Jon Billings, NP  diclofenac Sodium (VOLTAREN) 1 % GEL Apply 2 g topically See admin instructions. Apply 2 grams to joints/ knuckles- right hand   Yes [provider]  lidocaine (LMX) 4 % cream Apply 1 Application topically See admin instructions. Apply to both feet at bedtime   Yes [provider]  Menthol, Topical Analgesic, (BIOFREEZE) 4 % GEL Apply 1 application  topically See admin instructions. Apply to neck and knees 2 times a day   Yes [provider]  methocarbamol (ROBAXIN) 500 MG tablet Take 250-500 mg by mouth See admin instructions. Take 250 mg by mouth in the morning and 500 mg at bedtime   Yes [provider]  Multiple Vitamins-Minerals (DECUBI-VITE PO) Take 1 capsule by  mouth daily.   Yes [provider]  Nutritional Supplement LIQD Take 120 mLs by mouth 2 (two) times daily.   Yes [provider]  nystatin (MYCOSTATIN/NYSTOP) powder Apply 1 application  topically See admin instructions. Apply to buttocks/sacral area 2 times a day after washing/drying the area   Yes [provider]  polyethylene glycol (MIRALAX / GLYCOLAX) 17 g packet Take 17 g by mouth daily. 12/28/20  Yes Wouk, Ailene Rud, MD  risperiDONE (RISPERDAL) 0.5 MG tablet Take 0.5 mg by mouth 2 (two) times daily.   Yes [provider]  rosuvastatin (CRESTOR) 10 MG tablet Take 1 tablet (10 mg total) by mouth daily. Patient taking differently: Take 10 mg by mouth at bedtime. 11/25/20  Yes Jon Billings, NP  sennosides-docusate sodium (SENOKOT-S) 8.6-50 MG tablet Take 1 tablet by mouth in the morning and at bedtime.   Yes [provider]  amLODipine (NORVASC) 10 MG tablet Take 1 tablet (10 mg total) by mouth at bedtime. Patient not taking: Reported on 02/14/2022 12/10/20   Debbe Odea, MD  gabapentin (NEURONTIN) 100 MG capsule Take 1 capsule (100 mg total) by mouth at bedtime. Take 1 capsule at bedtime. If no relief, can go up to 2 tablets at bedtime after 3 days. Patient not taking: Reported on 02/14/2022 09/09/20   Charyl Dancer, NP  tiZANidine (ZANAFLEX) 4 MG tablet Take 1 tablet (4 mg total) by mouth every 8 (eight) hours as needed for muscle spasms. Patient not taking: Reported on 02/14/2022 12/10/20   Debbe Odea, MD  traMADol (ULTRAM) 50 MG tablet Take 1 tablet (50 mg total) by mouth every 6 (six) hours as needed for moderate pain (mild pain). Patient not taking: Reported on 02/14/2022 12/13/20   Debbe Odea, MD  triamcinolone cream (KENALOG) 0.1 % Apply 1 application topically 2 (two) times daily. Patient not taking: Reported on 02/14/2022 10/12/20   Charyl Dancer, NP    Current Facility-Administered Medications  Medication Dose Route Frequency  Provider Last Rate Last Admin   feeding supplement (ENSURE ENLIVE / ENSURE PLUS) liquid 237 mL  237 mL Oral Daily Tu, Ching T, DO       risperiDONE (RISPERDAL) tablet 0.5 mg  0.5 mg Oral BID Tu, Ching T, DO       rosuvastatin (CRESTOR) tablet 10 mg  10 mg Oral QHS Tu, Ching T, DO       Current Outpatient Medications  Medication Sig Dispense Refill   acetaminophen (TYLENOL) 500 MG tablet Take 1,000 mg by mouth 2 (two) times daily.     amLODipine (NORVASC) 5 MG tablet Take 5 mg by mouth See admin instructions. Take 5 mg by mouth once a day and hold for a Systolic reading <381     Artificial Saliva (BIOTENE DRY MOUTH MOISTURIZING) SOLN Use as directed 1 spray in the mouth or throat every other day.     aspirin 81 MG chewable tablet Chew 1 tablet (81 mg total) by mouth daily.     carvedilol (COREG) 6.25 MG tablet Take 1 tablet (6.25 mg total) by mouth 2 (two) times daily with a meal. (Patient taking differently: Take 6.25 mg by mouth 2 (two) times daily as needed (for Systolic reading >829 and/or Diastolic reading >937).) 169 tablet 1   diclofenac Sodium (VOLTAREN) 1 % GEL Apply 2 g topically See admin instructions. Apply 2 grams to joints/ knuckles- right hand     lidocaine (LMX) 4 % cream Apply 1 Application topically See admin instructions. Apply to both feet at bedtime     Menthol, Topical Analgesic, (BIOFREEZE) 4 % GEL Apply 1 application  topically See admin instructions. Apply to neck and knees 2 times a day     methocarbamol (ROBAXIN) 500 MG tablet Take 250-500 mg by mouth See admin instructions. Take 250 mg by mouth in the morning and 500 mg at bedtime     Multiple Vitamins-Minerals (DECUBI-VITE PO) Take 1 capsule by mouth daily.     Nutritional Supplement LIQD Take 120 mLs by mouth 2 (two) times daily.     nystatin (MYCOSTATIN/NYSTOP) powder Apply 1 application  topically See admin instructions. Apply to buttocks/sacral area 2 times a day after washing/drying the area     polyethylene glycol  (MIRALAX / GLYCOLAX) 17 g packet Take 17 g by mouth daily. 14 each 0   risperiDONE (RISPERDAL) 0.5 MG tablet Take 0.5 mg by mouth 2 (two) times daily.     rosuvastatin (CRESTOR) 10 MG tablet Take 1 tablet (10 mg total) by mouth daily. (Patient taking differently: Take 10 mg by mouth at bedtime.) 90 tablet 1   sennosides-docusate sodium (SENOKOT-S) 8.6-50 MG tablet Take 1 tablet by mouth in the morning and at bedtime.     amLODipine (NORVASC) 10 MG tablet Take 1 tablet (10 mg total) by mouth at bedtime. (Patient not taking: Reported on 02/14/2022)     gabapentin (NEURONTIN) 100 MG capsule Take 1 capsule (100 mg total) by mouth at bedtime. Take 1 capsule at bedtime. If no relief, can go up to 2 tablets at bedtime after 3 days. (Patient not taking: Reported on 02/14/2022) 90 capsule 0   tiZANidine (ZANAFLEX) 4 MG tablet Take 1 tablet (4 mg total) by mouth every 8 (eight) hours as needed for muscle spasms. (Patient not taking: Reported on 02/14/2022) 30 tablet 0   traMADol (ULTRAM) 50 MG tablet Take 1 tablet (50 mg total) by mouth every 6 (six) hours as needed for moderate  pain (mild pain). (Patient not taking: Reported on 02/14/2022) 10 tablet 0   triamcinolone cream (KENALOG) 0.1 % Apply 1 application topically 2 (two) times daily. (Patient not taking: Reported on 02/14/2022) 30 g 0    Allergies as of 02/14/2022 - Review Complete 02/14/2022  Allergen Reaction Noted   Penicillins Rash and Other (See Comments) 10/22/2006    Social History   Socioeconomic History   Marital status: Single    Spouse name: Not on file   Number of children: 3   Years of education: Not on file   Highest education level: Not on file  Occupational History   Occupation: retired Pharmacist, hospital  Tobacco Use   Smoking status: Former    Packs/day: 0.50    Years: 20.00    Total pack years: 10.00    Types: Cigarettes    Quit date: 03/13/1982    Years since quitting: 39.9   Smokeless tobacco: Never  Vaping Use   Vaping Use: Never  used  Substance and Sexual Activity   Alcohol use: No   Drug use: No   Sexual activity: Yes    Birth control/protection: Post-menopausal  Other Topics Concern   Not on file  Social History Narrative   ** Merged History Encounter **       Lives alone   Social Determinants of Health   Financial Resource Strain: Not on file  Food Insecurity: Not on file  Transportation Needs: Not on file  Physical Activity: Not on file  Stress: Not on file  Social Connections: Not on file  Intimate Partner Violence: Not on file    Review of Systems: All systems reviewed and negative except where noted in HPI.  Physical Exam: Vital signs in last 24 hours: Temp:  [97.8 F (36.6 C)-98.6 F (37 C)] 97.8 F (36.6 C) (12/06 0811) Pulse Rate:  [66-84] 66 (12/06 0811) Resp:  [12-28] 18 (12/06 0811) BP: (119-170)/(44-84) 158/56 (12/06 0811) SpO2:  [94 %-100 %] 98 % (12/06 2992)    General:  Alert female in NAD Psych:  Pleasant, cooperative. Normal mood and affect Eyes: Pupils equal Ears:  Normal auditory acuity Nose: No deformity, discharge or lesions Mouth: tongue slightly dry Neck:  Supple, no masses felt Lungs:  Clear to auscultation.  Heart:  Regular rate, regular rhythm, murmur present. Bilateral  Abdomen:  Soft, nondistended, nontender, active bowel sounds, no masses felt Rectal :  Deferred Msk: Symmetrical without gross deformities.  Neurologic:  Alert, oriented, grossly normal neurologically Extremities : No edema Skin:  Intact without significant lesions.    Intake/Output from previous day: No intake/output data recorded. Intake/Output this shift:  No intake/output data recorded.    Principal Problem:   Acute GI bleeding Active Problems:   Essential hypertension   History of CVA (cerebrovascular accident)   Schizoaffective disorder ([place])    Tye Savoy, NP-C @  02/15/2022, 9:40 AM

## 2022-02-16 DIAGNOSIS — D500 Iron deficiency anemia secondary to blood loss (chronic): Secondary | ICD-10-CM | POA: Diagnosis not present

## 2022-02-16 DIAGNOSIS — R1310 Dysphagia, unspecified: Secondary | ICD-10-CM

## 2022-02-16 DIAGNOSIS — K922 Gastrointestinal hemorrhage, unspecified: Secondary | ICD-10-CM | POA: Diagnosis not present

## 2022-02-16 DIAGNOSIS — D509 Iron deficiency anemia, unspecified: Secondary | ICD-10-CM | POA: Diagnosis not present

## 2022-02-16 LAB — CBC
HCT: 26.5 % — ABNORMAL LOW (ref 36.0–46.0)
HCT: 23.6 % — ABNORMAL LOW (ref 36.0–46.0)
HCT: 25.4 % — ABNORMAL LOW (ref 36.0–46.0)
Hemoglobin: 7.7 g/dL — ABNORMAL LOW (ref 12.0–15.0)
Hemoglobin: 6.9 g/dL — CL (ref 12.0–15.0)
Hemoglobin: 7.4 g/dL — ABNORMAL LOW (ref 12.0–15.0)
MCH: 19.3 pg — ABNORMAL LOW (ref 26.0–34.0)
MCH: 19.4 pg — ABNORMAL LOW (ref 26.0–34.0)
MCH: 19.3 pg — ABNORMAL LOW (ref 26.0–34.0)
MCHC: 29.1 g/dL — ABNORMAL LOW (ref 30.0–36.0)
MCHC: 29.2 g/dL — ABNORMAL LOW (ref 30.0–36.0)
MCHC: 29.1 g/dL — ABNORMAL LOW (ref 30.0–36.0)
MCV: 66.4 fL — ABNORMAL LOW (ref 80.0–100.0)
MCV: 66.3 fL — ABNORMAL LOW (ref 80.0–100.0)
MCV: 66.3 fL — ABNORMAL LOW (ref 80.0–100.0)
Platelets: 298 10*3/uL (ref 150–400)
Platelets: 280 10*3/uL (ref 150–400)
Platelets: 291 10*3/uL (ref 150–400)
RBC: 3.99 MIL/uL (ref 3.87–5.11)
RBC: 3.56 MIL/uL — ABNORMAL LOW (ref 3.87–5.11)
RBC: 3.83 MIL/uL — ABNORMAL LOW (ref 3.87–5.11)
RDW: 25.4 % — ABNORMAL HIGH (ref 11.5–15.5)
RDW: 25.7 % — ABNORMAL HIGH (ref 11.5–15.5)
RDW: 25.8 % — ABNORMAL HIGH (ref 11.5–15.5)
WBC: 5.2 10*3/uL (ref 4.0–10.5)
WBC: 4.1 10*3/uL (ref 4.0–10.5)
WBC: 4.4 10*3/uL (ref 4.0–10.5)
nRBC: 0 % (ref 0.0–0.2)
nRBC: 0 % (ref 0.0–0.2)
nRBC: 0 % (ref 0.0–0.2)

## 2022-02-16 LAB — PREPARE RBC (CROSSMATCH)

## 2022-02-16 LAB — SURGICAL PCR SCREEN
MRSA, PCR: NEGATIVE
Staphylococcus aureus: NEGATIVE

## 2022-02-16 LAB — BASIC METABOLIC PANEL
Anion gap: 7 (ref 5–15)
BUN: 12 mg/dL (ref 8–23)
CO2: 22 mmol/L (ref 22–32)
Calcium: 9.1 mg/dL (ref 8.9–10.3)
Chloride: 105 mmol/L (ref 98–111)
Creatinine, Ser: 0.59 mg/dL (ref 0.44–1.00)
GFR, Estimated: >60 mL/min (ref >60)
Glucose, Bld: 86 mg/dL (ref 70–99)
Potassium: 3.7 mmol/L (ref 3.5–5.1)
Sodium: 134 mmol/L — ABNORMAL LOW (ref 135–145)

## 2022-02-16 MED ORDER — PEG-KCL-NACL-NASULF-NA ASC-C 100 G PO SOLR
0.5000 | Freq: Once | ORAL | Status: AC
  Administered 2022-02-16: 100 g via ORAL
  Filled 2022-02-16: qty 1

## 2022-02-16 MED ORDER — PEG-KCL-NACL-NASULF-NA ASC-C 100 G PO SOLR
0.5000 | Freq: Once | ORAL | Status: AC
  Administered 2022-02-16: 100 g via ORAL

## 2022-02-16 MED ORDER — SODIUM CHLORIDE 0.9% IV SOLUTION: Freq: Once | INTRAVENOUS | Status: AC

## 2022-02-16 MED ORDER — PEG-KCL-NACL-NASULF-NA ASC-C 100 G PO SOLR: 1.0000 | Freq: Once | ORAL | Status: DC

## 2022-02-16 MED ORDER — METOCLOPRAMIDE HCL 5 MG/ML IJ SOLN
10.0000 mg | Freq: Once | INTRAMUSCULAR | Status: AC
  Administered 2022-02-16: 10 mg via INTRAVENOUS
  Filled 2022-02-16: qty 2

## 2022-02-16 MED ORDER — MUPIROCIN 2 % EX OINT
1.0000 | TOPICAL_OINTMENT | Freq: Two times a day (BID) | CUTANEOUS | Status: DC
  Administered 2022-02-16 – 2022-02-20 (×9): 1 via NASAL
  Filled 2022-02-16 (×2): qty 22

## 2022-02-16 NOTE — Progress Notes (Addendum)
Daily Progress Note  Hospital Day: 3  Chief Complaint: anemia, FOBT+  Assessment   Patient profile:  [name] is a 81 y.o. female with a past medical history significant for adenomatous colon polyps, diverticulosis, hypertension, aortic valve stenosis, CVA, schizoaffective disorder, hyperlipidemia, anxiety and depression    . See PMH for any additional medical problems. Admitted 12/6 with profound anemia found on outpatient labs.    # Severe acute on chronic iron deficiency anemia and FOBT+. Hgb one year ago was 9, down to 4.8. Worsening anemia possibly secondary to PUD in setting of NSAIDS. Other considerations include gastrointestinal neoplasm, AVMs.  Appropriate rise in hgb from 4.8 >> 7.7 post 2 u PRBCs but  it dropped to 6.9 this am.  Got Fe infusion last night, scheduled for another this evening.    # Chronic constipation. Miralax and senokot on home med list  # Parkinsonism features with pill-rolling tremor and masked facies and bradykinesia.   # Dysphagia.    # History of CVA, home asa currently on hold  Plan:    Plan is for EGD ( with possible dilation) and colonoscopy. Not sure this can be done tomorrow from a scheduling standpoint. Still trying to work timing out.  The risks benefits and indications and she understands and agrees to proceed  Change to clear liquid in case we are able to proceed with colonoscopy tomorrow.  2nd IV iron infusion scheduled for this evening.  Repeat CBC to make sure hgb of 6.9 is accurate, if so then may need more blood   Repeat Dulcolax 20 mg now. Will start bowel prep this evening  ----------------------------------------------------------------------------------------------------------  I have also seen and evaluated the patient.  She is on the schedule for tomorrow for an EGD and colonoscopy.  Her repeat hemoglobin was 7.4 Subjective  No complaints. No BMs after Dulcolax yesterday.    Objective  Imaging:  No  results found.  Lab Results: Recent Labs    02/15/22 0300 02/15/22 1442 02/16/22 0031  WBC 5.4 4.5 5.2  HGB 4.8* 7.6* 7.7*  HCT 18.3* 25.8* 26.5*  PLT 325 287 298   BMET Recent Labs    02/14/22 1652 02/15/22 0300 02/16/22 0031  NA 136 134* 134*  K 4.0 3.8 3.7  CL 106 104 105  CO2 20* 22 22  GLUCOSE 100* 86 86  BUN '17 13 12  '$ CREATININE 0.46 0.40* 0.59  CALCIUM 9.2 9.0 9.1   LFT Recent Labs    02/14/22 1652  PROT 6.9  ALBUMIN 4.2  AST 14*  ALT 10  ALKPHOS 44  BILITOT 0.5   PT/INR No results for input(s): "LABPROT", "INR" in the last 72 hours.   Scheduled inpatient medications:   feeding supplement  237 mL Oral Daily   mupirocin ointment  1 Application Nasal BID   pantoprazole  40 mg Oral Daily   risperiDONE  0.5 mg Oral BID   rosuvastatin  10 mg Oral QHS   Continuous inpatient infusions:   ferric gluconate (FERRLECIT) IVPB 250 mg (02/16/22 0125)   PRN inpatient medications:   Vital signs in last 24 hours: Temp:  [97.7 F (36.5 C)-98.3 F (36.8 C)] 98 F (36.7 C) (12/07 1137) Pulse Rate:  [63-78] 69 (12/07 1137) Resp:  [15-24] 18 (12/07 1137) BP: (133-170)/(39-77) 135/59 (12/07 1137) SpO2:  [96 %-100 %] 98 % (12/07 1137)   No intake or output data in the 24 hours ending 02/16/22 1141  Intake/Output from previous day: No  intake/output data recorded. Intake/Output this shift: No intake/output data recorded.   Physical Exam:  General: Alert female in NAD. HOH Heart:  Regular rate and rhythm.  Pulmonary: Normal respiratory effort Abdomen: Soft, nondistended, nontender. Normal bowel sounds. Extremities: No lower extremity edema  Neurologic: Alert and oriented Psych: Pleasant. Cooperative.    Principal Problem:   Acute GI bleeding Active Problems:   Essential hypertension   History of CVA (cerebrovascular accident)   Schizoaffective disorder ([place])   Severe anemia     LOS: 2 days   Tye Savoy ,NP 02/16/2022, 11:41  AM

## 2022-02-16 NOTE — Evaluation (Signed)
Physical Therapy Evaluation Only Patient Details Name: [name] MRN: [id number] DOB: April 30, 1940 Today's Date: 02/16/2022  History of Present Illness  [name] is a 81 y.o. female presents with anemia. S/p 2 units of PRBC. GI consulted, plan for EGD and colonoscopy on 12/8 as well as possible esophageal dilatation due to history of some intermittent solid food dysphagia. PMH: HTN, aortic valve stenosis, CVA, schizoaffective disorder, hyperlipidemia, anxiety and depression, R total knee revision  Clinical Impression  Pt reports living at [facility] for over a [facility], nursing using hoyer lift for bed<>w/c transfer, total assist with self care tasks. Pt with bil knee contractures, unable to fully extend knees, lacking more on L compared to R. Pt comes to sitting EOB with max A, able to maintain static sitting with BUE support and min guard assist. Pt positions self in semi R sidelying with BLE pulled up to the R at EOS while supine in bed. Pt reports being at baseline with mobility, no further PT needs identified, will sign off at this time.     Recommendations for follow up therapy are one component of a multi-disciplinary discharge planning process, led by the attending physician.  Recommendations may be updated based on patient status, additional functional criteria and insurance authorization.  Follow Up Recommendations Long-term institutional care without follow-up therapy Can patient physically be transported by private vehicle: No    Assistance Recommended at Discharge Frequent or constant Supervision/Assistance  Patient can return home with the following       Equipment Recommendations None recommended by PT  Recommendations for Other Services       Functional Status Assessment Patient has not had a recent decline in their functional status     Precautions / Restrictions Precautions Precautions: Fall Restrictions Weight Bearing Restrictions: No       Mobility  Bed Mobility Overal bed mobility: Needs Assistance Bed Mobility: Supine to Sit, Sit to Supine  Supine to sit: Max assist Sit to supine: Max assist  General bed mobility comments: pt using bedrail and therapist's hand to pull up, inches BLE to EOB, ultimately needing max A to fully upright trunk and bring BLE off of bed with bedpad    Transfers  General transfer comment: pt reports hoyer at baseline    Ambulation/Gait  General Gait Details: pt reports w/c at baseline  Stairs            Wheelchair Mobility    Modified Rankin (Stroke Patients Only)       Balance Overall balance assessment: Needs assistance Sitting-balance support: Feet supported, Bilateral upper extremity supported Sitting balance-Leahy Scale: Poor Sitting balance - Comments: seated EOB, min guard once upright with BUE supporting on EOB       Pertinent Vitals/Pain Pain Assessment Pain Assessment: No/denies pain    Home Living Family/patient expects to be discharged to:: [place]: Wheelchair - manual      Prior Function Prior Level of Function : Needs assist  Mobility Comments: Pt reports nursing transfers her bed to w/c via hoyer lift at [place] facility, unsure of the last time she walked. ADLs Comments: Pt reports total assist at LTC facility.     Hand Dominance        Extremity/Trunk Assessment   Upper Extremity Assessment Upper Extremity Assessment: Defer to OT evaluation    Lower Extremity Assessment Lower Extremity Assessment: RLE deficits/detail;LLE deficits/detail RLE Deficits / Details: AROM WFL, knee contracture ~20 deg from extension, able to  flex knee past 90 deg while supine; positions self in semi R sidelying with BLE pulled up to the R LLE Deficits / Details: AROM WFL, knee contracture ~45 deg from extension, able to flex knee past 90 deg while supine; positions self in semi R sidelying with BLE pulled up to the R     Cervical / Trunk Assessment Cervical / Trunk Assessment: Kyphotic  Communication   Communication: HOH  Cognition Arousal/Alertness: Awake/alert Behavior During Therapy: WFL for tasks assessed/performed Overall Cognitive Status: Within Functional Limits for tasks assessed  General Comments: Pt able to state name, DOB, age, location, date, and situation appropriately. Pt follows commands appropriately, though sometimes needs repeating due to very HOH.        General Comments      Exercises     Assessment/Plan    PT Assessment Patient does not need any further PT services  PT Problem List         PT Treatment Interventions      PT Goals (Current goals can be found in the Care Plan section)  Acute Rehab PT Goals Patient Stated Goal: return home PT Goal Formulation: All assessment and education complete, DC therapy    Frequency       Co-evaluation               AM-PAC PT "6 Clicks" Mobility  Outcome Measure Help needed turning from your back to your side while in a flat bed without using bedrails?: A Lot Help needed moving from lying on your back to sitting on the side of a flat bed without using bedrails?: A Lot Help needed moving to and from a bed to a chair (including a wheelchair)?: Total Help needed standing up from a chair using your arms (e.g., wheelchair or bedside chair)?: Total Help needed to walk in hospital room?: Total Help needed climbing 3-5 steps with a railing? : Total 6 Click Score: 8    End of Session   Activity Tolerance: Patient tolerated treatment well Patient left: in bed;with call bell/phone within reach;with bed alarm set Nurse Communication: Mobility status PT Visit Diagnosis: Muscle weakness (generalized) (M62.81)    Time: 2334-3568 PT Time Calculation (min) (ACUTE ONLY): 19 min   Charges:   PT Evaluation $PT Eval Low Complexity: 1 Low           Tori Cherita Hebel PT, DPT 02/16/22, 1:59 PM

## 2022-02-16 NOTE — Progress Notes (Signed)
PROGRESS NOTE  [name] GTX:[id number] DOB: 08-May-1940 DOA: 02/14/2022 PCP: Jon Billings, NP  HPI/Recap of past 24 hours: [name] is a 81 y.o. female with medical history significant of HTN, aortic valve stenosis, CVA, schizoaffective disorder, HLD, anxiety and depression who presents with anemia. Per ED documentation had labs drawn at [facility] with hemoglobin of 4.9.  Repeat hemoglobin in the ED of 4.7 with prior baseline of around 8-9.  FOBT positive.  Last recorded colonoscopy in 2009 with adenomatous polyp sigmoid diverticulosis. In the ED, patient received 2 units of PRBC.  GI consulted.  Patient admitted for further management.   Today, patient denies any new complaints, noted to be apprehensive about procedures.  Educated patient.  Repeat hemoglobin noted to drop to 6.9, another repeat showed 7.4    Assessment/Plan: Principal Problem:   Acute GI bleeding Active Problems:   Schizoaffective disorder (HCC)   Essential hypertension   History of CVA (cerebrovascular accident)   Severe anemia   Dysphagia   Acute on chronic microcytic anemia/iron deficiency Possible GI bleeding Hgb of 4.7 on presentation with +FOBT Had remote colonoscopy in 2009 with adenomatous polyp sigmoid and diverticulosis Anemia panel showed iron 8, sats 2, ferritin 2, TIBC 514 S/p 2 units of PRBC, repeat hemoglobin stable Start IV iron GI consulted, plan for EGD and colonoscopy on 12/8 as well as possible esophageal dilatation due to history of some intermittent solid food dysphagia PPI Daily CBC   Schizoaffective disorder (HCC) Stable mood   History of CVA Hold home aspirin   Essential hypertension Hold all antihypertensives for now       Code Status: DNR  Family Communication: None at bedside  Disposition Plan: Status is: Inpatient Remains inpatient appropriate because: Level of  care      Consultants: GI  Procedures: None  Antimicrobials: None  DVT prophylaxis: SCD   Objective: Vitals:   02/16/22 0734 02/16/22 0845 02/16/22 1137 02/16/22 1700  BP: (!) 170/57  (!) 135/59   Pulse: 68  69   Resp: 18  18   Temp: 97.7 F (36.5 C)  98 F (36.7 C)   TempSrc: Oral  Oral   SpO2: 98% 98% 98%   Weight:    65.4 kg    Exam: General: NAD, very hard of hearing, oriented x 3 Cardiovascular: S1, S2 present Respiratory: CTAB Abdomen: Soft, nontender, nondistended, bowel sounds present Musculoskeletal: No bilateral pedal edema noted Skin: Normal Psychiatry: Normal mood     Data Reviewed: CBC: Recent Labs  Lab 02/14/22 1652 02/15/22 0300 02/15/22 1442 02/16/22 0031 02/16/22 1248 02/16/22 1548  WBC 4.7 5.4 4.5 5.2 4.1 4.4  NEUTROABS 2.8  --   --   --   --   --   HGB 4.7* 4.8* 7.6* 7.7* 6.9* 7.4*  HCT 18.4* 18.3* 25.8* 26.5* 23.6* 25.4*  MCV 59.7* 59.0* 66.3* 66.4* 66.3* 66.3*  PLT 344 325 287 298 280 248   Basic Metabolic Panel: Recent Labs  Lab 02/14/22 1652 02/15/22 0300 02/16/22 0031  NA 136 134* 134*  K 4.0 3.8 3.7  CL 106 104 105  CO2 20* 22 22  GLUCOSE 100* 86 86  BUN _0 CREATININE 0.46 0.40* 0.59  CALCIUM 9.2 9.0 9.1   GFR: CrCl cannot be calculated (Unknown ideal weight.). Liver Function Tests: Recent Labs  Lab 02/14/22 1652  AST 14*  ALT 10  ALKPHOS 44  BILITOT 0.5  PROT 6.9  ALBUMIN 4.2   No  results for input(s): "LIPASE", "AMYLASE" in the last 168 hours. No results for input(s): "AMMONIA" in the last 168 hours. Coagulation Profile: No results for input(s): "INR", "PROTIME" in the last 168 hours. Cardiac Enzymes: No results for input(s): "CKTOTAL", "CKMB", "CKMBINDEX", "TROPONINI" in the last 168 hours. BNP (last 3 results) No results for input(s): "PROBNP" in the last 8760 hours. HbA1C: No results for input(s): "HGBA1C" in the last 72 hours. CBG: No results for input(s): "GLUCAP" in the last 168  hours. Lipid Profile: No results for input(s): "CHOL", "HDL", "LDLCALC", "TRIG", "CHOLHDL", "LDLDIRECT" in the last 72 hours. Thyroid Function Tests: No results for input(s): "TSH", "T4TOTAL", "FREET4", "T3FREE", "THYROIDAB" in the last 72 hours. Anemia Panel: Recent Labs    02/15/22 0300  FERRITIN 2*  TIBC 514*  IRON 8*   Urine analysis:    Component Value Date/Time   COLORURINE YELLOW 02/15/2021 0541   APPEARANCEUR CLEAR 02/15/2021 0541   APPEARANCEUR Cloudy (A) 08/31/2020 1039   LABSPEC 1.020 02/15/2021 0541   PHURINE 6.0 02/15/2021 0541   GLUCOSEU NEGATIVE 02/15/2021 0541   HGBUR NEGATIVE 02/15/2021 0541   BILIRUBINUR NEGATIVE 02/15/2021 0541   BILIRUBINUR Negative 08/31/2020 1039   KETONESUR NEGATIVE 02/15/2021 0541   PROTEINUR NEGATIVE 02/15/2021 0541   UROBILINOGEN 0.2 06/25/2008 1242   NITRITE NEGATIVE 02/15/2021 0541   LEUKOCYTESUR MODERATE (A) 02/15/2021 0541   Sepsis Labs: _0 (procalcitonin:4,lacticidven:4)  ) Recent Results (from the past 240 hour(s))  Surgical pcr screen     Status: None   Collection Time: 02/16/22  6:51 AM   Specimen: Nasal Mucosa; Nasal Swab  Result Value Ref Range Status   MRSA, PCR NEGATIVE NEGATIVE Final   Staphylococcus aureus NEGATIVE NEGATIVE Final    Comment: (NOTE) The Xpert SA Assay (FDA approved for NASAL specimens in patients 55 years of age and older), is one component of a comprehensive surveillance program. It is not intended to diagnose infection nor to guide or monitor treatment. Performed at [facility], [place] [address], [place], [place] [address]       Studies: No results found.  Scheduled Meds:  feeding supplement  237 mL Oral Daily   mupirocin ointment  1 Application Nasal BID   pantoprazole  40 mg Oral Daily   Patricia 3350 powder  0.5 kit Oral Once   Patricia 3350 powder  0.5 kit Oral Once   risperiDONE  0.5 mg Oral BID   rosuvastatin  10 mg Oral QHS    Continuous Infusions:   ferric gluconate (FERRLECIT) IVPB 250 mg (02/16/22 0125)     LOS: 2 days     Alma Friendly, MD [facility]  If 7PM-7AM, please contact night-coverage [email or web address] 02/16/2022, 6:08 PM

## 2022-02-16 NOTE — TOC Initial Note (Signed)
Transition of Care [facility]) - Initial/Assessment Note    Patient Details  Name: [name] MRN: [id number] Date of Birth: [birth year]  Transition of Care [facility]) CM/SW Contact:    Dessa Phi, RN Phone Number: 02/16/2022, 1:39 PM  Clinical Narrative: From [facility] confirmed per rep Lorenza Chick to return.                  Expected Discharge Plan: Long Term Nursing Home Barriers to Discharge: Continued Medical Work up   Patient Goals and CMS Choice        Expected Discharge Plan and Services Expected Discharge Plan: [place]                                              Prior Living Arrangements/Services                       Activities of Daily Living Home Assistive Devices/Equipment: Wheelchair ADL Screening (condition at time of admission) Patient's cognitive ability adequate to safely complete daily activities?: Yes Is the patient deaf or have difficulty hearing?: Yes Does the patient have difficulty seeing, even when wearing glasses/contacts?: No Does the patient have difficulty concentrating, remembering, or making decisions?: No Patient able to express need for assistance with ADLs?: Yes Does the patient have difficulty dressing or bathing?: Yes Independently performs ADLs?: No Communication: Independent Dressing (OT): Needs assistance Is this a change from baseline?: Pre-admission baseline Grooming: Needs assistance Is this a change from baseline?: Pre-admission baseline Feeding: Independent Bathing: Needs assistance Is this a change from baseline?: Pre-admission baseline Toileting: Needs assistance Is this a change from baseline?: Pre-admission baseline In/Out Bed: Needs assistance Is this a change from baseline?: Pre-admission baseline Walks in Home: Independent with device (comment) Does the patient have difficulty walking or climbing stairs?: Yes Weakness of Legs: Both Weakness of Arms/Hands: Both  Permission  Sought/Granted                  Emotional Assessment              Admission diagnosis:  Acute GI bleeding [K92.2] Occult GI bleeding [R19.5] Severe anemia [D64.9] Symptomatic anemia [D64.9] Patient Active Problem List   Diagnosis Date Noted   Severe anemia 02/15/2022   Acute GI bleeding 89/21/1941   Acute metabolic encephalopathy 74/10/1446   Schizoaffective disorder ([place]) 02/14/2021   Hypokalemia 02/14/2021   History of CVA (cerebrovascular accident) 12/23/2020   UTI (urinary tract infection) 12/23/2020   Right knee dislocation, initial encounter 12/23/2020   Pressure injury of skin 12/23/2020   Dislocated knee, right, recurrent, initial encounter 12/22/2020   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[facility]) 12/10/2020   Knee dislocation, right, initial encounter 12/10/2020   Mixed hyperlipidemia 12/05/2020   Depression with anxiety 12/05/2020   Coma ([place]) 12/05/2020   Strangulated inguinal hernia 12/02/2020   Anxiety 11/25/2020   Aortic valve stenosis 09/09/2020   Prediabetes 05/17/2020   Essential hypertension 05/10/2020   Age-related osteoporosis without current pathological fracture 01/03/2016   Pure hypercholesterolemia 01/03/2016   Elevated TSH 01/03/2016   Primary osteoarthritis of both knees 12/06/2015   Vitamin D deficiency 06/16/2014   Personal history of other malignant neoplasm of skin 02/19/2014   Prolapse of vaginal vault 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 08/14/2013   Patient on low glycemic diet 08/02/2012   Knee pain 08/02/2012   Goals of  care, counseling/discussion 06/20/2011   Neuropathy 06/20/2011   Uterine prolapse 06/20/2011   Anemia 06/18/2009   HYPOKALEMIA, MILD 06/25/2008   INTESTINAL OBSTRUCTION, HX OF 12/11/2007   BLEEDING, POSTMENOPAUSAL 10/23/2006   PERSONALITY DISORDER 10/22/2006   TREMOR, ESSENTIAL 10/22/2006   TRANSAMINASES, SERUM, ELEVATED 10/22/2006   PCP:  Jon Billings, NP Pharmacy:  No Pharmacies Listed    Social Determinants of  Health (SDOH) Interventions    Readmission Risk Interventions     No data to display

## 2022-02-16 NOTE — Evaluation (Signed)
Occupational Therapy Evaluation Patient Details Name: [name] MRN: [id number] DOB: [birth year] Today's Date: 02/16/2022   History of Present Illness Ms. Prindiville is a 81 yr old female admitted to the hospital with anemia. PMH: HTN, AVR, CVA, schizoaffective disorder, HLD, anxiety, depression, OA   Clinical Impression   The patient required min assist for rolling in bed, moderate assist to scoot to the head of the bed, total assist for simulated lower body dressing, and set-up assist at bed level to initiate self-feeding. She reported a history of CVA from ~1 year ago, with resultant L sided weakness. She reported being total assist for bathing, dressing, and toileting, and set-up assist for feeding, at her baseline level of functioning. At current, she appears to be at or very near to her baseline level of functioning for self-care management, & she is not presenting with acute functional deficits that warrant the need for further OT services in the acute care setting. OT will sign off and recommend she return to her long-term care facility at discharge.       Recommendations for follow up therapy are one component of a multi-disciplinary discharge planning process, led by the attending physician.  Recommendations may be updated based on patient status, additional functional criteria and insurance authorization.   Follow Up Recommendations  Other (comment) (Return to long-term care facility/SNF)     Assistance Recommended at Discharge Frequent or constant Supervision/Assistance  Patient can return home with the following A lot of help with bathing/dressing/bathroom;A lot of help with walking and/or transfers    Functional Status Assessment  Patient has not had a recent decline in their functional status  Equipment Recommendations  None recommended by OT       Precautions / Restrictions Precautions Precautions: Fall Restrictions Weight Bearing Restrictions: No Other  Position/Activity Restrictions: Old LUE weakness from prior CVA      Mobility Bed Mobility Overal bed mobility: Needs Assistance Bed Mobility: Rolling Rolling: Min assist         General bed mobility comments: Moderate assist needed to scoot to the head of the bed    Transfers        General transfer comment: pt reports hoyer at baseline          ADL either performed or assessed with clinical judgement   ADL Overall ADL's : At baseline         Vision Baseline Vision/History: 1 Wears glasses              Pertinent Vitals/Pain Pain Assessment Pain Assessment: Faces Pain Score: 3  Pain Location: She reported having chronic pain in her feet from neuropathy. Pain Intervention(s): Other (comment), Repositioned (BLE elevated)     Hand Dominance Right   Extremity/Trunk Assessment Upper Extremity Assessment Upper Extremity Assessment: LUE deficits/detail;RUE deficits/detail RUE Deficits / Details: RUE AROM and strength WFL LUE Deficits / Details: Weakness from a prior CVA. Grip strength 0/5, elbow flexion 2/5, elbow extension 2+/5, shoulder flexion 1+/5   Lower Extremity Assessment Lower Extremity Assessment: RLE deficits/detail;LLE deficits/detail RLE Deficits / Details: AROM WFL, knee contracture ~20 deg from extension, able to flex knee past 90 deg while supine; positions self in semi R sidelying with BLE pulled up to the R (as noted from PT evaluation) LLE Deficits / Details: AROM WFL, knee contracture ~45 deg from extension, able to flex knee past 90 deg while supine; positions self in semi R sidelying with BLE pulled up to the R (as noted from PT evaluation)  Communication Communication Communication: HOH   Cognition Arousal/Alertness: Awake/alert Behavior During Therapy: WFL for tasks assessed/performed Overall Cognitive Status: Within Functional Limits for tasks assessed      General Comments: Pt able to state name, DOB, age, location, date, and  situation appropriately. Pt follows commands appropriately, though sometimes needs repeating due to very HOH.      Home Living Family/patient expects to be discharged to:: [place]: Wheelchair - manual          Prior Functioning/Environment Prior Level of Function : Needs assist             Mobility Comments:  (Pt reports nursing transfers her bed to w/c via hoyer lift at [facility] facility, unsure of the last time she walked. She stated she could help propel her wheelchair) ADLs Comments:  (Pt reported being able to self-feed after set-up assist is provided, however she required total assist for toileting (uses briefs), bathing (bed baths), and dressing)        OT Problem List: Decreased strength;Decreased range of motion;Impaired balance (sitting and/or standing);Impaired tone;Impaired UE functional use      OT Treatment/Interventions:   No further OT treatment needs identified       OT Frequency:  N/A       AM-PAC OT "6 Clicks" Daily Activity     Outcome Measure Help from another person eating meals?: A Little Help from another person taking care of personal grooming?: A Little Help from another person toileting, which includes using toliet, bedpan, or urinal?: Total Help from another person bathing (including washing, rinsing, drying)?: A Lot Help from another person to put on and taking off regular upper body clothing?: A Lot Help from another person to put on and taking off regular lower body clothing?: Total 6 Click Score: 12   End of Session Nurse Communication: Mobility status  Activity Tolerance: Patient tolerated treatment well Patient left: in bed;with call bell/phone within reach;with bed alarm set  OT Visit Diagnosis: Muscle weakness (generalized) (M62.81)                Time: 8016-5537 OT Time Calculation (min): 17 min Charges:  OT General Charges $OT Visit: 1 Visit OT Evaluation $OT Eval Moderate Complexity:  1 Mod   Zerrick Hanssen L Evalie Hargraves, OTR/L 02/16/2022, 2:50 PM

## 2022-02-16 NOTE — NC FL2 (Signed)
[place] LEVEL OF CARE FORM     IDENTIFICATION  Patient Name: [name] Birthdate: 07-24-1940 Sex: female Admission Date (Current Location): 02/14/2022  [facility] and [place] Number:  Herbalist and Address:  [facility]  [address] [place], [place]      Provider Number: [id number]  Attending Physician Name and Address:  Alma Friendly, MD  Relative Name and Phone Number:   Caryl Pina Haynes(dtr) [phone])    Current Level of Care: Hospital Recommended Level of Care: Nursing Facility Prior Approval Number:    Date Approved/Denied:   PASRR Number:    Discharge Plan: Other (Comment) (LTC)    Current Diagnoses: Patient Active Problem List   Diagnosis Date Noted   Severe anemia 02/15/2022   Acute GI bleeding 81/03/7508   Acute metabolic encephalopathy 25/85/2778   Schizoaffective disorder ([place]) 02/14/2021   Hypokalemia 02/14/2021   History of CVA (cerebrovascular accident) 12/23/2020   UTI (urinary tract infection) 12/23/2020   Right knee dislocation, initial encounter 12/23/2020   Pressure injury of skin 12/23/2020   Dislocated knee, right, recurrent, initial encounter 12/22/2020   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[facility]) 12/10/2020   Knee dislocation, right, initial encounter 12/10/2020   Mixed hyperlipidemia 12/05/2020   Depression with anxiety 12/05/2020   Coma ([place]) 12/05/2020   Strangulated inguinal hernia 12/02/2020   Anxiety 11/25/2020   Aortic valve stenosis 09/09/2020   Prediabetes 05/17/2020   Essential hypertension 05/10/2020   Age-related osteoporosis without current pathological fracture 01/03/2016   Pure hypercholesterolemia 01/03/2016   Elevated TSH 01/03/2016   Primary osteoarthritis of both knees 12/06/2015   Vitamin D deficiency 06/16/2014   Personal history of other malignant neoplasm of skin 02/19/2014   Prolapse of vaginal vault 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 08/14/2013   Patient on low  glycemic diet 08/02/2012   Knee pain 08/02/2012   Goals of care, counseling/discussion 06/20/2011   Neuropathy 06/20/2011   Uterine prolapse 06/20/2011   Anemia 06/18/2009   HYPOKALEMIA, MILD 06/25/2008   INTESTINAL OBSTRUCTION, HX OF 12/11/2007   BLEEDING, POSTMENOPAUSAL 10/23/2006   PERSONALITY DISORDER 10/22/2006   TREMOR, ESSENTIAL 10/22/2006   TRANSAMINASES, SERUM, ELEVATED 10/22/2006    Orientation RESPIRATION BLADDER Height & Weight     Self, Time, Situation, Place  Normal Incontinent Weight:   Height:     BEHAVIORAL SYMPTOMS/MOOD NEUROLOGICAL BOWEL NUTRITION STATUS      Incontinent Diet (clears)  AMBULATORY STATUS COMMUNICATION OF NEEDS Skin   Total Care Verbally Normal                       Personal Care Assistance Level of Assistance  Bathing, Feeding, Dressing, Total care Bathing Assistance: Maximum assistance Feeding assistance: Limited assistance Dressing Assistance: Maximum assistance Total Care Assistance: Maximum assistance   Functional Limitations Info  Sight, Hearing, Speech Sight Info: Impaired (eyeglasses) Hearing Info: Impaired (Bilateral hearing aids.) Speech Info: Adequate    SPECIAL CARE FACTORS FREQUENCY  PT (By licensed PT), OT (By licensed OT)     PT Frequency:  (2x week) OT Frequency:  (2x week)            Contractures Contractures Info: Not present    Additional Factors Info  Code Status, Allergies Code Status Info:  (DNR) Allergies Info:  (Penicillins)           Current Medications (02/16/2022):  This is the current hospital active medication list Current Facility-Administered Medications  Medication Dose Route Frequency Provider Last Rate  Last Admin   feeding supplement (ENSURE ENLIVE / ENSURE PLUS) liquid 237 mL  237 mL Oral Daily Tu, Ching T, DO   237 mL at 02/16/22 0910   ferric gluconate (FERRLECIT) 250 mg in sodium chloride 0.9 % 250 mL IVPB  250 mg Intravenous Daily Alma Friendly, MD 135 mL/hr at 02/16/22  0125 250 mg at 02/16/22 0125   mupirocin ointment (BACTROBAN) 2 % 1 Application  1 Application Nasal BID Raenette Rover, NP   1 Application at 33/35/45 0910   pantoprazole (PROTONIX) EC tablet 40 mg  40 mg Oral Daily Alma Friendly, MD   40 mg at 02/16/22 0910   risperiDONE (RISPERDAL) tablet 0.5 mg  0.5 mg Oral BID Tu, Ching T, DO   0.5 mg at 02/16/22 0910   rosuvastatin (CRESTOR) tablet 10 mg  10 mg Oral QHS Tu, Ching T, DO   10 mg at 02/16/22 6256     Discharge Medications: Please see discharge summary for a list of discharge medications.  Relevant Imaging Results:  Relevant Lab Results:   Additional Information  [phone])  Michaila Kenney, Juliann Pulse, RN

## 2022-02-17 ENCOUNTER — Encounter (HOSPITAL_COMMUNITY): Payer: Self-pay | Admitting: Internal Medicine

## 2022-02-17 ENCOUNTER — Encounter (HOSPITAL_COMMUNITY)
Admission: EM | Disposition: A | Discharge status: Skilled Nursing Facility | Payer: Self-pay | Source: Skilled Nursing Facility | Attending: Internal Medicine

## 2022-02-17 ENCOUNTER — Inpatient Hospital Stay (HOSPITAL_COMMUNITY): Payer: Medicare PPO | Admitting: Certified Registered Nurse Anesthetist

## 2022-02-17 DIAGNOSIS — D509 Iron deficiency anemia, unspecified: Secondary | ICD-10-CM | POA: Diagnosis not present

## 2022-02-17 DIAGNOSIS — K259 Gastric ulcer, unspecified as acute or chronic, without hemorrhage or perforation: Secondary | ICD-10-CM

## 2022-02-17 DIAGNOSIS — K449 Diaphragmatic hernia without obstruction or gangrene: Secondary | ICD-10-CM

## 2022-02-17 DIAGNOSIS — K5730 Diverticulosis of large intestine without perforation or abscess without bleeding: Secondary | ICD-10-CM

## 2022-02-17 DIAGNOSIS — R195 Other fecal abnormalities: Secondary | ICD-10-CM | POA: Diagnosis not present

## 2022-02-17 DIAGNOSIS — K648 Other hemorrhoids: Secondary | ICD-10-CM

## 2022-02-17 DIAGNOSIS — K3189 Other diseases of stomach and duodenum: Secondary | ICD-10-CM

## 2022-02-17 DIAGNOSIS — K6289 Other specified diseases of anus and rectum: Secondary | ICD-10-CM | POA: Diagnosis not present

## 2022-02-17 DIAGNOSIS — F418 Other specified anxiety disorders: Secondary | ICD-10-CM

## 2022-02-17 DIAGNOSIS — K922 Gastrointestinal hemorrhage, unspecified: Secondary | ICD-10-CM | POA: Diagnosis not present

## 2022-02-17 HISTORY — PX: ESOPHAGOGASTRODUODENOSCOPY (EGD) WITH PROPOFOL: SHX5813

## 2022-02-17 HISTORY — PX: BIOPSY: SHX5522

## 2022-02-17 HISTORY — PX: COLONOSCOPY WITH PROPOFOL: SHX5780

## 2022-02-17 LAB — CBC
HCT: 28.8 % — ABNORMAL LOW (ref 36.0–46.0)
Hemoglobin: 8.6 g/dL — ABNORMAL LOW (ref 12.0–15.0)
MCH: 20.2 pg — ABNORMAL LOW (ref 26.0–34.0)
MCHC: 29.9 g/dL — ABNORMAL LOW (ref 30.0–36.0)
MCV: 67.8 fL — ABNORMAL LOW (ref 80.0–100.0)
Platelets: 299 10*3/uL (ref 150–400)
RBC: 4.25 MIL/uL (ref 3.87–5.11)
RDW: 26 % — ABNORMAL HIGH (ref 11.5–15.5)
WBC: 4.2 10*3/uL (ref 4.0–10.5)
nRBC: 0 % (ref 0.0–0.2)

## 2022-02-17 LAB — BASIC METABOLIC PANEL
Anion gap: 9 (ref 5–15)
BUN: 8 mg/dL (ref 8–23)
CO2: 20 mmol/L — ABNORMAL LOW (ref 22–32)
Calcium: 9 mg/dL (ref 8.9–10.3)
Chloride: 106 mmol/L (ref 98–111)
Creatinine, Ser: 0.41 mg/dL — ABNORMAL LOW (ref 0.44–1.00)
GFR, Estimated: >60 mL/min (ref >60)
Glucose, Bld: 80 mg/dL (ref 70–99)
Potassium: 3.3 mmol/L — ABNORMAL LOW (ref 3.5–5.1)
Sodium: 135 mmol/L (ref 135–145)

## 2022-02-17 SURGERY — ESOPHAGOGASTRODUODENOSCOPY (EGD) WITH PROPOFOL
Anesthesia: Monitor Anesthesia Care

## 2022-02-17 MED ORDER — PANTOPRAZOLE SODIUM 40 MG PO TBEC
40.0000 mg | DELAYED_RELEASE_TABLET | Freq: Two times a day (BID) | ORAL | Status: DC
  Administered 2022-02-17 – 2022-02-20 (×7): 40 mg via ORAL
  Filled 2022-02-17 (×8): qty 1

## 2022-02-17 MED ORDER — PROPOFOL 500 MG/50ML IV EMUL
INTRAVENOUS | Status: DC | PRN
  Administered 2022-02-17: 150 ug/kg/min via INTRAVENOUS

## 2022-02-17 MED ORDER — SODIUM CHLORIDE 0.9 % IV SOLN: INTRAVENOUS | Status: DC

## 2022-02-17 MED ORDER — LIDOCAINE HCL (CARDIAC) PF 100 MG/5ML IV SOSY
PREFILLED_SYRINGE | INTRAVENOUS | Status: DC | PRN
  Administered 2022-02-17: 50 mg via INTRAVENOUS

## 2022-02-17 MED ORDER — PROPOFOL 10 MG/ML IV BOLUS
INTRAVENOUS | Status: DC | PRN
  Administered 2022-02-17: 40 mg via INTRAVENOUS

## 2022-02-17 MED ORDER — ASPIRIN 81 MG PO CHEW
81.0000 mg | CHEWABLE_TABLET | Freq: Every day | ORAL | Status: DC
  Administered 2022-02-17 – 2022-02-18 (×2): 81 mg via ORAL
  Filled 2022-02-17 (×2): qty 1

## 2022-02-17 MED ORDER — AMLODIPINE BESYLATE 5 MG PO TABS
5.0000 mg | ORAL_TABLET | Freq: Every day | ORAL | Status: DC
  Administered 2022-02-17 – 2022-02-20 (×4): 5 mg via ORAL
  Filled 2022-02-17 (×4): qty 1

## 2022-02-17 MED ORDER — ACETAMINOPHEN 325 MG PO TABS
650.0000 mg | ORAL_TABLET | Freq: Four times a day (QID) | ORAL | Status: DC | PRN
  Administered 2022-02-17 – 2022-02-20 (×3): 650 mg via ORAL
  Filled 2022-02-17 (×3): qty 2

## 2022-02-17 MED ORDER — LACTATED RINGERS IV SOLN
INTRAVENOUS | Status: AC | PRN
  Administered 2022-02-17: 1000 mL via INTRAVENOUS

## 2022-02-17 SURGICAL SUPPLY — 25 items

## 2022-02-17 NOTE — Plan of Care (Signed)
  Problem: Clinical Measurements: Goal: Will remain free from infection Outcome: Progressing Goal: Diagnostic test results will improve Outcome: Progressing   Problem: Safety: Goal: Ability to remain free from injury will improve Outcome: Progressing   

## 2022-02-17 NOTE — Progress Notes (Signed)
Blood transfusion started.

## 2022-02-17 NOTE — Op Note (Signed)
[facility] Patient Name: [name] Procedure Date: 02/17/2022 MRN: [id number] Attending MD: Mauri Pole , MD, [id number] Date of Birth: 1940/04/06 CSN: [id number] Age: 81 Admit Type: Inpatient Procedure:                Upper GI endoscopy Indications:              Gastrointestinal bleeding of unknown origin,                            Suspected upper gastrointestinal bleeding in                            patient with unexplained iron deficiency anemia Providers:                Mauri Pole, MD, Carlyn Reichert, RN, Faustina                            Mbum[place], Technician Referring MD:              Medicines:                Monitored Anesthesia Care Complications:            No immediate complications. Estimated Blood Loss:     Estimated blood loss was minimal. Procedure:                Pre-Anesthesia Assessment:                           - Prior to the procedure, a History and Physical                            was performed, and patient medications and                            allergies were reviewed. The patient's tolerance of                            previous anesthesia was also reviewed. The risks                            and benefits of the procedure and the sedation                            options and risks were discussed with the patient.                            All questions were answered, and informed consent                            was obtained. Prior Anticoagulants: The patient has                            taken no anticoagulant or antiplatelet agents. ASA  Grade Assessment: III - A patient with severe                            systemic disease. After reviewing the risks and                            benefits, the patient was deemed in satisfactory                            condition to undergo the procedure.                           After obtaining informed consent, the endoscope was                             passed under direct vision. Throughout the                            procedure, the patient's blood pressure, pulse, and                            oxygen saturations were monitored continuously. The                            [id number]-[id number] ([id number] Olympus endoscope was introduced                            through the mouth, and advanced to the second part                            of duodenum. The upper GI endoscopy was                            accomplished without difficulty. The patient                            tolerated the procedure well. Scope In: Scope Out: Findings:      The Z-line was regular and was found 36 cm from the incisors.      The examined esophagus was normal.      A large hiatal hernia was present.      Few non-bleeding cratered and superficial gastric ulcers with no       stigmata of bleeding were found in the gastric antrum and in the       prepyloric region of the stomach. The largest lesion was 3 mm in largest       dimension. Biopsies were taken with a cold forceps for histology.       Biopsies were taken with a cold forceps for Helicobacter pylori testing.      Diffuse mucosal flattening was found in the first portion of the       duodenum and in the second portion of the duodenum. Biopsies were taken       with a cold forceps for histology. Impression:               - Z-line regular, 36  cm from the incisors.                           - Normal esophagus.                           - Large hiatal hernia.                           - Non-bleeding gastric ulcers with no stigmata of                            bleeding. Biopsied.                           - Flattened mucosa was found in the duodenum, rule                            out celiac disease. Biopsied. Moderate Sedation:      Not Applicable - Patient had care per Anesthesia. Recommendation:           - Resume previous diet.                           - Continue present medications.                            - Await pathology results.                           - Use Protonix (pantoprazole) 40 mg PO BID.                           - No ibuprofen, naproxen, or other non-steroidal                            anti-inflammatory drugs.                           - GI will sign off, available if have any questions Procedure Code(s):        --- Professional ---                           [phone], Esophagogastroduodenoscopy, flexible,                            transoral; with biopsy, single or multiple Diagnosis Code(s):        --- Professional ---                           K44.9, Diaphragmatic hernia without obstruction or                            gangrene                           K25.9, Gastric ulcer, unspecified as acute or  chronic, without hemorrhage or perforation                           K31.89, Other diseases of stomach and duodenum                           K92.2, Gastrointestinal hemorrhage, unspecified                           D50.9, Iron deficiency anemia, unspecified CPT copyright 2022 [employer]. All rights reserved. The codes documented in this report are preliminary and upon coder review may  be revised to meet current compliance requirements. Mauri Pole, MD 02/17/2022 1:51:23 PM This report has been signed electronically. Number of Addenda: 0

## 2022-02-17 NOTE — Progress Notes (Addendum)
PROGRESS NOTE    [name]  UEA:[id number] DOB: Jul 29, 1940 DOA: 02/14/2022 PCP: Jon Billings, NP   Brief Narrative: 81 year old with past medical history significant for hypertension, aortic valve stenosis, CVA, schizoaffective disorder, hyperlipidemia, anxiety, depression, presents with anemia.  Patient had labs drawn at her facility [facility] and was found to have a hemoglobin of 4.9.  She was sent to the ED for further evaluation.  Hemoglobin in the ED was consistent with 4.7.  Prior hemoglobin baseline around 8-9.  FOBT positive.  Last colonoscopy 2009 with adenomatous polyps, sigmoid diverticulosis.  Patient received 2 units of packed red blood cells on admission.  GI was consulted.  Repeated hemoglobin down to 6.9.  She received another unit of 12/8.  Repeated hemoglobin around 8.  GI recommended endoscopy and colonoscopy,   Assessment & Plan:   Principal Problem:   Acute GI bleeding Active Problems:   Schizoaffective disorder (HCC)   Essential hypertension   History of CVA (cerebrovascular accident)   Severe anemia   Dysphagia  1-Acute on chronic microcytic anemia, iron deficiency anemia Acute on chronic GI bleeding Presented with a hemoglobin of 4.7, FOBT positive. Remote colonoscopy 2009 with adenomatous polyps and sigmoid diverticulosis. Anemia panel: Iron 8, ferritin 2, TIBC 514 She received 2 units of packed red blood cells this  admission. GI consulted.  Patient underwent endoscopy and colonoscopy 12/8 Colonoscopy: diverticulosis, non bleeding external and internal hemorrhoids.  Otherwise normal examination. Endoscopy: Z-line was regular, was found 36 cm incisors.  Examined 6 esophagus was normal.  Large hiatal hernia was present.  Few none bleeding cratered and superficial gastric ulcer with no stigmata of bleeding were found in the gastric antrum and in the prepyloric region of the stomach.  Biopsy was taken.  Diffuse mucosal flattening was found in  the first portion of the Duodenum  and the second portion. Needs PPI BID>  Plan to check CBC tomorrow am  Schizoaffective disorder: On risperidone.   History of CVA: Holding aspirin, resume when okay by GI Addendum; per GI ok to resume aspirin today   Essential hypertension: resume Norvasc.    Pressure Injury 12/11/20 Buttocks Right Stage 1 -  Intact skin with non-blanchable redness of a localized area usually over a bony prominence. (Active)  12/11/20 0815  Location: Buttocks  Location Orientation: Right  Staging: Stage 1 -  Intact skin with non-blanchable redness of a localized area usually over a bony prominence.  Wound Description (Comments):   Present on Admission: No     Pressure Injury 12/11/20 Buttocks Left Stage 1 -  Intact skin with non-blanchable redness of a localized area usually over a bony prominence. (Active)  12/11/20 0815  Location: Buttocks  Location Orientation: Left  Staging: Stage 1 -  Intact skin with non-blanchable redness of a localized area usually over a bony prominence.  Wound Description (Comments):   Present on Admission: No     Pressure Injury 12/22/20 Coccyx Medial Stage 2 -  Partial thickness loss of dermis presenting as a shallow open injury with a red, pink wound bed without slough. (Active)  12/22/20 2310  Location: Coccyx  Location Orientation: Medial  Staging: Stage 2 -  Partial thickness loss of dermis presenting as a shallow open injury with a red, pink wound bed without slough.  Wound Description (Comments):   Present on Admission: Yes     Pressure Injury 12/22/20 Ankle Posterior;Right Deep Tissue Pressure Injury - Purple or maroon localized area of discolored intact skin or blood-filled  blister due to damage of underlying soft tissue from pressure and/or shear. (Active)  12/22/20 2310  Location: Ankle  Location Orientation: Posterior;Right  Staging: Deep Tissue Pressure Injury - Purple or maroon localized area of discolored intact  skin or blood-filled blister due to damage of underlying soft tissue from pressure and/or shear.  Wound Description (Comments):   Present on Admission: Yes      Estimated body mass index is 25.54 kg/m as calculated from the following:   Height as of 02/14/21: '5\' 3"'$  (1.6 m).   Weight as of this encounter: 65.4 kg.   DVT prophylaxis: SCD Code Status: DNR Family Communication: care discussed with patient.  Disposition Plan:  Status is: Inpatient Remains inpatient appropriate because: management of anemia    Consultants:  GI  Procedures:  Endoscopy, colonoscopy   Antimicrobials:    Subjective: She would like to speak with GI Dr prior to procedure. She had stroke, during surgery last year.   Objective: Vitals:   02/17/22 0218 02/17/22 0251 02/17/22 0252 02/17/22 0521  BP: (!) 149/60 (!) 156/59 (!) 156/59 (!) 161/66  Pulse: 72 71 71 72  Resp: '16 18 18 20  '$ Temp: 98.1 F (36.7 C) 98.1 F (36.7 C) 98.1 F (36.7 C) 98.2 F (36.8 C)  TempSrc: Oral  Oral Oral  SpO2: 95%  97% 98%  Weight:        Intake/Output Summary (Last 24 hours) at 02/17/2022 0713 Last data filed at 02/17/2022 0600 Gross per 24 hour  Intake 723.57 ml  Output --  Net 723.57 ml   Filed Weights   02/16/22 1700  Weight: 65.4 kg    Examination:  General exam: Appears calm and comfortable  Respiratory system: Clear to auscultation. Respiratory effort normal. Cardiovascular system: S1 & S2 heard, RRR. No JVD, murmurs, rubs, gallops or clicks. No pedal edema. Gastrointestinal system: Abdomen is nondistended, soft and nontender. No organomegaly or masses felt. Normal bowel sounds heard. Central nervous system: Alert and oriented. No focal neurological deficits. Extremities: Symmetric 5 x 5 power.   Data Reviewed: I have personally reviewed following labs and imaging studies  CBC: Recent Labs  Lab 02/14/22 1652 02/15/22 0300 02/15/22 1442 02/16/22 0031 02/16/22 1248 02/16/22 1548  WBC 4.7  5.4 4.5 5.2 4.1 4.4  NEUTROABS 2.8  --   --   --   --   --   HGB 4.7* 4.8* 7.6* 7.7* 6.9* 7.4*  HCT 18.4* 18.3* 25.8* 26.5* 23.6* 25.4*  MCV 59.7* 59.0* 66.3* 66.4* 66.3* 66.3*  PLT 344 325 287 298 280 659   Basic Metabolic Panel: Recent Labs  Lab 02/14/22 1652 02/15/22 0300 02/16/22 0031  NA 136 134* 134*  K 4.0 3.8 3.7  CL 106 104 105  CO2 20* 22 22  GLUCOSE 100* 86 86  BUN '17 13 12  '$ CREATININE 0.46 0.40* 0.59  CALCIUM 9.2 9.0 9.1   GFR: CrCl cannot be calculated (Unknown ideal weight.). Liver Function Tests: Recent Labs  Lab 02/14/22 1652  AST 14*  ALT 10  ALKPHOS 44  BILITOT 0.5  PROT 6.9  ALBUMIN 4.2   No results for input(s): "LIPASE", "AMYLASE" in the last 168 hours. No results for input(s): "AMMONIA" in the last 168 hours. Coagulation Profile: No results for input(s): "INR", "PROTIME" in the last 168 hours. Cardiac Enzymes: No results for input(s): "CKTOTAL", "CKMB", "CKMBINDEX", "TROPONINI" in the last 168 hours. BNP (last 3 results) No results for input(s): "PROBNP" in the last 8760 hours. HbA1C: No results  for input(s): "HGBA1C" in the last 72 hours. CBG: No results for input(s): "GLUCAP" in the last 168 hours. Lipid Profile: No results for input(s): "CHOL", "HDL", "LDLCALC", "TRIG", "CHOLHDL", "LDLDIRECT" in the last 72 hours. Thyroid Function Tests: No results for input(s): "TSH", "T4TOTAL", "FREET4", "T3FREE", "THYROIDAB" in the last 72 hours. Anemia Panel: Recent Labs    02/15/22 0300  FERRITIN 2*  TIBC 514*  IRON 8*   Sepsis Labs: No results for input(s): "PROCALCITON", "LATICACIDVEN" in the last 168 hours.  Recent Results (from the past 240 hour(s))  Surgical pcr screen     Status: None   Collection Time: 02/16/22  6:51 AM   Specimen: Nasal Mucosa; Nasal Swab  Result Value Ref Range Status   MRSA, PCR NEGATIVE NEGATIVE Final   Staphylococcus aureus NEGATIVE NEGATIVE Final    Comment: (NOTE) The Xpert SA Assay (FDA approved for  NASAL specimens in patients 41 years of age and older), is one component of a comprehensive surveillance program. It is not intended to diagnose infection nor to guide or monitor treatment. Performed at [facility], [place] [address], [place], [place] [address]          Radiology Studies: No results found.      Scheduled Meds:  feeding supplement  237 mL Oral Daily   mupirocin ointment  1 Application Nasal BID   pantoprazole  40 mg Oral Daily   risperiDONE  0.5 mg Oral BID   rosuvastatin  10 mg Oral QHS   Continuous Infusions:  ferric gluconate (FERRLECIT) IVPB 250 mg (02/16/22 2145)     LOS: 3 days    Time spent: 35 minutes    Medea Deines A Torrence Branagan, MD [facility]   If 7PM-7AM, please contact night-coverage [email or web address]  02/17/2022, 7:13 AM

## 2022-02-17 NOTE — H&P (View-Only) (Signed)
Called by RN - patient wants to talk about endoscopic procedures.   Went by to see her. She had a stroke during / after a major operation. She is concerned about possibility of another stroke. I told her that I don't know details surrounding her last stroke but that sedation always carries risks. We aren't planning general anesthesia but there are still cardiopulmonary risks with MAC. She wants to discuss further with Dr. Gessner when she gets to endoscopy. Bowels are clear.  

## 2022-02-17 NOTE — Progress Notes (Signed)
Called by RN - patient wants to talk about endoscopic procedures.   Went by to see her. She had a stroke during / after a major operation. She is concerned about possibility of another stroke. I told her that I don't know details surrounding her last stroke but that sedation always carries risks. We aren't planning general anesthesia but there are still cardiopulmonary risks with MAC. She wants to discuss further with Dr. Carlean Purl when she gets to endoscopy. Bowels are clear.

## 2022-02-17 NOTE — Transfer of Care (Signed)
Immediate Anesthesia Transfer of Care Note  Patient: [name]  Procedure(s) Performed: ESOPHAGOGASTRODUODENOSCOPY (EGD) WITH PROPOFOL COLONOSCOPY WITH PROPOFOL  Patient Location: Short Stay  Anesthesia Type:MAC  Level of Consciousness: awake, alert , and patient cooperative  Airway & Oxygen Therapy: Patient Spontanous Breathing  Post-op Assessment: Report given to RN, Post -op Vital signs reviewed and stable, and Patient moving all extremities X 4  Post vital signs: Reviewed and stable  Last Vitals:  Vitals Value Taken Time  BP 114/36 02/17/22 1350  Temp    Pulse 78 02/17/22 1352  Resp 23 02/17/22 1353  SpO2 97 % 02/17/22 1352  Vitals shown include unvalidated device data.  Last Pain:  Vitals:   02/17/22 1206  TempSrc: Temporal  PainSc: 7       Patients Stated Pain Goal: 0 (01/31/61 4469)  Complications: No notable events documented.

## 2022-02-17 NOTE — Interval H&P Note (Signed)
History and Physical Interval Note:  02/17/2022 12:55 PM  [name]  has presented today for surgery, with the diagnosis of dysphagia, iron deficiency anemia.  The various methods of treatment have been discussed with the patient and family. After consideration of risks, benefits and other options for treatment, the patient has consented to  Procedure(s): ESOPHAGOGASTRODUODENOSCOPY (EGD) WITH PROPOFOL (N/A) COLONOSCOPY WITH PROPOFOL (N/A) as a surgical intervention.  The patient's history has been reviewed, patient examined, no change in status, stable for surgery.  I have reviewed the patient's chart and labs.  Questions were answered to the patient's satisfaction.     Labarron Durnin

## 2022-02-17 NOTE — Progress Notes (Signed)
PT Cancellation Note / Screen  Patient Details Name: [name] MRN: [id number] DOB: April 12, 1940   Cancelled Treatment:    Reason Eval/Treat Not Completed: PT screened, no needs identified, will sign off Pt from LTC at [facility].  Evaluated by PT yesterday, 02/17/22 and determined to be at baseline.  PT to sign off.   Kati L Payson 02/17/2022, 4:34 PM

## 2022-02-17 NOTE — Progress Notes (Signed)
Blood transfusion completed without any reaction.

## 2022-02-17 NOTE — Anesthesia Preprocedure Evaluation (Addendum)
Anesthesia Evaluation  Patient identified by MRN, date of birth, ID band Patient awake    Reviewed: Allergy & Precautions, NPO status , Patient's Chart, lab work & pertinent test results, reviewed documented beta blocker date and time   Airway Mallampati: II  TM Distance: >3 FB Neck ROM: Full    Dental  (+) Upper Dentures, Lower Dentures, Dental Advisory Given   Pulmonary former smoker   Pulmonary exam normal breath sounds clear to auscultation       Cardiovascular hypertension, Pt. on medications and Pt. on home beta blockers Normal cardiovascular exam+ Valvular Problems/Murmurs (mild/mod AS, mild/mod MR) AS and MR  Rhythm:Regular Rate:Normal  TTE 2022  1. Left ventricular ejection fraction, by estimation, is 60 to 65%. The  left ventricle has normal function. The left ventricle has no regional  wall motion abnormalities. There is mild left ventricular hypertrophy.  Left ventricular diastolic parameters  are consistent with Grade I diastolic dysfunction (impaired relaxation).   2. Right ventricular systolic function is normal. The right ventricular  size is normal. Tricuspid regurgitation signal is inadequate for assessing  PA pressure.   3. Mild to moderate mitral valve regurgitation.   4. The aortic valve is moderate calcified. Mild aortic valve stenosis by  aortic valve mean gradient measures 14.5 mmHg. Aortic valve Vmax measures  2.43 m/s. Moderate stenosis by Aortic valve area, by VTI measures 1.26  cm.     Neuro/Psych  PSYCHIATRIC DISORDERS Anxiety Depression  Schizophrenia  CVA    GI/Hepatic Neg liver ROS, PUD,,,  Endo/Other    Renal/GU negative Renal ROS  negative genitourinary   Musculoskeletal  (+) Arthritis ,    Abdominal   Peds  Hematology  (+) Blood dyscrasia, anemia Lab Results      Component                Value               Date                      WBC                      4.2                  02/17/2022                HGB                      8.6 (L)             02/17/2022                HCT                      28.8 (L)            02/17/2022                MCV                      67.8 (L)            02/17/2022                PLT                      299  02/17/2022              Anesthesia Other Findings   Reproductive/Obstetrics                             Anesthesia Physical Anesthesia Plan  ASA: 3  Anesthesia Plan: MAC   Post-op Pain Management:    Induction: Intravenous  PONV Risk Score and Plan: Propofol infusion and Treatment may vary due to age or medical condition  Airway Management Planned: Natural Airway  Additional Equipment:   Intra-op Plan:   Post-operative Plan:   Informed Consent: I have reviewed the patients History and Physical, chart, labs and discussed the procedure including the risks, benefits and alternatives for the proposed anesthesia with the patient or authorized representative who has indicated his/her understanding and acceptance.     Dental advisory given  Plan Discussed with: CRNA  Anesthesia Plan Comments:        Anesthesia Quick Evaluation

## 2022-02-17 NOTE — Op Note (Signed)
[facility] Patient Name: [name] Procedure Date: 02/17/2022 MRN: [id number] Attending MD: Mauri Pole , MD, [id number] Date of Birth: Aug 13, 1940 CSN: [id number] Age: 81 Admit Type: Inpatient Procedure:                Colonoscopy Indications:              Evaluation of unexplained GI bleeding presenting                            with fecal occult blood, Unexplained iron                            deficiency anemia Providers:                Mauri Pole, MD, Carlyn Reichert, RN, Faustina                            Mbumina, Technician Referring MD:              Medicines:                Monitored Anesthesia Care Complications:            No immediate complications. Estimated Blood Loss:     Estimated blood loss was minimal. Procedure:                Pre-Anesthesia Assessment:                           - Prior to the procedure, a History and Physical                            was performed, and patient medications and                            allergies were reviewed. The patient's tolerance of                            previous anesthesia was also reviewed. The risks                            and benefits of the procedure and the sedation                            options and risks were discussed with the patient.                            All questions were answered, and informed consent                            was obtained. Prior Anticoagulants: The patient has                            taken no anticoagulant or antiplatelet agents. ASA                            Grade Assessment:  III - A patient with severe                            systemic disease. After reviewing the risks and                            benefits, the patient was deemed in satisfactory                            condition to undergo the procedure.                           After obtaining informed consent, the colonoscope                            was passed under  direct vision. Throughout the                            procedure, the patient's blood pressure, pulse, and                            oxygen saturations were monitored continuously. The                            [id number]-HQ190L ([id number] Olympus colonoscope was                            introduced through the anus and advanced to the the                            cecum, identified by appendiceal orifice and                            ileocecal valve. The colonoscopy was performed                            without difficulty. The patient tolerated the                            procedure well. The quality of the bowel                            preparation was adequate. The ileocecal valve,                            appendiceal orifice, and rectum were photographed. Scope In: 1:20:05 PM Scope Out: 1:38:40 PM Scope Withdrawal Time: 0 hours 7 minutes 22 seconds  Total Procedure Duration: 0 hours 18 minutes 35 seconds  Findings:      The digital rectal exam findings include decreased sphincter tone.      Scattered small-mouthed diverticula were found in the sigmoid colon.      Non-bleeding external and internal hemorrhoids were found, unable to do       retroflexion due to weak anal sphincter with inability to retain any  rectal air. The hemorrhoids were small.      The exam was otherwise without abnormality. Impression:               - Decreased sphincter tone found on digital rectal                            exam.                           - Diverticulosis in the sigmoid colon.                           - Non-bleeding external and internal hemorrhoids.                           - The examination was otherwise normal.                           - No specimens collected. Moderate Sedation:      N/A Recommendation:           - See the other procedure note for documentation of                            additional recommendations.                           - No repeat colonoscopy due  to age. Procedure Code(s):        --- Professional ---                           [phone], Colonoscopy, flexible; diagnostic, including                            collection of specimen(s) by brushing or washing,                            when performed (separate procedure) Diagnosis Code(s):        --- Professional ---                           K62.89, Other specified diseases of anus and rectum                           K64.8, Other hemorrhoids                           R19.5, Other fecal abnormalities                           D50.9, Iron deficiency anemia, unspecified                           K57.30, Diverticulosis of large intestine without                            perforation or abscess without bleeding CPT copyright 2022 [employer] Association. All  rights reserved. The codes documented in this report are preliminary and upon coder review may  be revised to meet current compliance requirements. Mauri Pole, MD 02/17/2022 1:55:30 PM This report has been signed electronically. Number of Addenda: 0

## 2022-02-18 DIAGNOSIS — K922 Gastrointestinal hemorrhage, unspecified: Secondary | ICD-10-CM | POA: Diagnosis not present

## 2022-02-18 LAB — CBC
HCT: 31.4 % — ABNORMAL LOW (ref 36.0–46.0)
HCT: 29.3 % — ABNORMAL LOW (ref 36.0–46.0)
Hemoglobin: 9.1 g/dL — ABNORMAL LOW (ref 12.0–15.0)
Hemoglobin: 8.3 g/dL — ABNORMAL LOW (ref 12.0–15.0)
MCH: 20.1 pg — ABNORMAL LOW (ref 26.0–34.0)
MCH: 20.3 pg — ABNORMAL LOW (ref 26.0–34.0)
MCHC: 29 g/dL — ABNORMAL LOW (ref 30.0–36.0)
MCHC: 28.3 g/dL — ABNORMAL LOW (ref 30.0–36.0)
MCV: 69.5 fL — ABNORMAL LOW (ref 80.0–100.0)
MCV: 71.6 fL — ABNORMAL LOW (ref 80.0–100.0)
Platelets: 278 10*3/uL (ref 150–400)
Platelets: 272 10*3/uL (ref 150–400)
RBC: 4.52 MIL/uL (ref 3.87–5.11)
RBC: 4.09 MIL/uL (ref 3.87–5.11)
RDW: 26.8 % — ABNORMAL HIGH (ref 11.5–15.5)
RDW: 27 % — ABNORMAL HIGH (ref 11.5–15.5)
WBC: 5.7 10*3/uL (ref 4.0–10.5)
WBC: 4.8 10*3/uL (ref 4.0–10.5)
nRBC: 0 % (ref 0.0–0.2)
nRBC: 0 % (ref 0.0–0.2)

## 2022-02-18 LAB — BPAM RBC
Blood Product Expiration Date: 202401072359
Blood Product Expiration Date: 202401072359
Blood Product Expiration Date: 202401082359
ISSUE DATE / TIME: 202312060226
ISSUE DATE / TIME: 202312060521
ISSUE DATE / TIME: 202312080229
Unit Type and Rh: 5100
Unit Type and Rh: 5100
Unit Type and Rh: 5100

## 2022-02-18 LAB — TYPE AND SCREEN
ABO/RH(D): O POS
Antibody Screen: NEGATIVE
Donor AG Type: NEGATIVE
Donor AG Type: NEGATIVE
Donor AG Type: NEGATIVE
Unit division: 0
Unit division: 0
Unit division: 0

## 2022-02-18 LAB — BASIC METABOLIC PANEL
Anion gap: 8 (ref 5–15)
BUN: 11 mg/dL (ref 8–23)
CO2: 21 mmol/L — ABNORMAL LOW (ref 22–32)
Calcium: 9.3 mg/dL (ref 8.9–10.3)
Chloride: 107 mmol/L (ref 98–111)
Creatinine, Ser: 0.47 mg/dL (ref 0.44–1.00)
GFR, Estimated: >60 mL/min (ref >60)
Glucose, Bld: 90 mg/dL (ref 70–99)
Potassium: 3.3 mmol/L — ABNORMAL LOW (ref 3.5–5.1)
Sodium: 136 mmol/L (ref 135–145)

## 2022-02-18 MED ORDER — POTASSIUM CHLORIDE CRYS ER 20 MEQ PO TBCR
40.0000 meq | EXTENDED_RELEASE_TABLET | Freq: Once | ORAL | Status: AC
  Administered 2022-02-18: 40 meq via ORAL
  Filled 2022-02-18: qty 2

## 2022-02-18 MED ORDER — ASPIRIN 81 MG PO TBEC
81.0000 mg | DELAYED_RELEASE_TABLET | Freq: Every day | ORAL | Status: DC
  Administered 2022-02-19 – 2022-02-20 (×2): 81 mg via ORAL
  Filled 2022-02-18 (×2): qty 1

## 2022-02-18 NOTE — Plan of Care (Signed)
  Problem: Clinical Measurements: Goal: Will remain free from infection Outcome: Progressing Goal: Diagnostic test results will improve Outcome: Progressing   Problem: Nutrition: Goal: Adequate nutrition will be maintained Outcome: Progressing   Problem: Safety: Goal: Ability to remain free from injury will improve Outcome: Progressing

## 2022-02-18 NOTE — Progress Notes (Signed)
OT Cancellation Note  Patient Details Name: [name] MRN: [id number] DOB: 1940/04/21   Cancelled Treatment:    Reason Eval/Treat Not Completed: OT screened, no needs identified, will sign off As per OT evaluation on 12/7, patient is a long term care resident at SNF with plans to transition back to SNF for long term care at time of d/c. Patient does not have acute skilled OT needs at this time. OT signing off at this time. Thank you for this referral.  Rennie Plowman, [place] [facility] Office# [phone]  02/18/2022, 7:02 AM

## 2022-02-18 NOTE — Progress Notes (Addendum)
PROGRESS NOTE    [name]  JTT:[id number] DOB: 1940-10-21 DOA: 02/14/2022 PCP: Jon Billings, NP   Brief Narrative: 81 year old with past medical history significant for hypertension, aortic valve stenosis, CVA, schizoaffective disorder, hyperlipidemia, anxiety, depression, presents with anemia.  Patient had labs drawn at her facility [facility] and was found to have a hemoglobin of 4.9.  She was sent to the ED for further evaluation.  Hemoglobin in the ED was consistent with 4.7.  Prior hemoglobin baseline around 8-9.  FOBT positive.  Last colonoscopy 2009 with adenomatous polyps, sigmoid diverticulosis.  Patient received 2 units of packed red blood cells on admission.  GI was consulted.  Repeated hemoglobin down to 6.9.  She received another unit of 12/8.  Repeated hemoglobin around 8.   GI recommended endoscopy and colonoscopy, completed 12/8 with finding of superficial gastric ulcers and started on PPI. Hb 12/9 9 and then 8.  Assessment & Plan:   Principal Problem:   Acute GI bleeding Active Problems:   Schizoaffective disorder (HCC)   Essential hypertension   History of CVA (cerebrovascular accident)   Symptomatic anemia   Dysphagia   Multiple gastric ulcers  Acute on chronic microcytic anemia, iron deficiency anemia Acute on chronic GI bleeding Presented with a hemoglobin of 4.7, FOBT positive.Remote colonoscopy 2009 with adenomatous polyps and sigmoid diverticulosis. Anemia panel: Iron 8, ferritin 2, TIBC 514. She received 2 units of packed red blood cells this  admission. GI consulted.  Patient underwent endoscopy and colonoscopy 12/8 Colonoscopy: diverticulosis, non bleeding external and internal hemorrhoids.  Otherwise normal examination. Endoscopy: Z-line was regular, was found 36 cm incisors.  Examined 6 esophagus was normal.  Large hiatal hernia was present.  Few none bleeding cratered and superficial gastric ulcer with no stigmata of bleeding were found  in the gastric antrum and in the prepyloric region of the stomach.  Biopsy was taken.  Diffuse mucosal flattening was found in the first portion of the Duodenum  and the second portion. -PPI PO BID -trend CBC until stable   Schizoaffective disorder: On risperidone.   History of CVA: per GI ok to resume aspirin   Essential hypertension: resume Norvasc.  SEM Suspect AS.    Pressure Injury 12/11/20 Buttocks Right Stage 1 -  Intact skin with non-blanchable redness of a localized area usually over a bony prominence. (Active)  12/11/20 0815  Location: Buttocks  Location Orientation: Right  Staging: Stage 1 -  Intact skin with non-blanchable redness of a localized area usually over a bony prominence.  Wound Description (Comments):   Present on Admission: No     Pressure Injury 12/11/20 Buttocks Left Stage 1 -  Intact skin with non-blanchable redness of a localized area usually over a bony prominence. (Active)  12/11/20 0815  Location: Buttocks  Location Orientation: Left  Staging: Stage 1 -  Intact skin with non-blanchable redness of a localized area usually over a bony prominence.  Wound Description (Comments):   Present on Admission: No     Pressure Injury 12/22/20 Coccyx Medial Stage 2 -  Partial thickness loss of dermis presenting as a shallow open injury with a red, pink wound bed without slough. (Active)  12/22/20 2310  Location: Coccyx  Location Orientation: Medial  Staging: Stage 2 -  Partial thickness loss of dermis presenting as a shallow open injury with a red, pink wound bed without slough.  Wound Description (Comments):   Present on Admission: Yes     Pressure Injury 12/22/20 Ankle Posterior;Right Deep  Tissue Pressure Injury - Purple or maroon localized area of discolored intact skin or blood-filled blister due to damage of underlying soft tissue from pressure and/or shear. (Active)  12/22/20 2310  Location: Ankle  Location Orientation: Posterior;Right  Staging: Deep  Tissue Pressure Injury - Purple or maroon localized area of discolored intact skin or blood-filled blister due to damage of underlying soft tissue from pressure and/or shear.  Wound Description (Comments):   Present on Admission: Yes      Estimated body mass index is 25.54 kg/m as calculated from the following:   Height as of 02/14/21: '5\' 3"'$  (1.6 m).   Weight as of this encounter: 65.4 kg.   DVT prophylaxis: SCD Code Status: DNR Family Communication: care discussed with patient.  Disposition Plan:  Status is: Inpatient Remains inpatient appropriate because: management of anemia Consultants:  GI  Procedures:  Endoscopy, colonoscopy   Antimicrobials:   Subjective: No complaints this morning.   Objective: Vitals:   02/17/22 2126 02/18/22 0457 02/18/22 0947 02/18/22 1142  BP: (!) 157/69 (!) 145/59 129/69 (!) 110/43  Pulse: 75 71 67 69  Resp: (!) 21 (!) 22  (!) 21  Temp: 97.7 F (36.5 C) 97.8 F (36.6 C)  98.1 F (36.7 C)  TempSrc: Oral Axillary    SpO2: 97% 97%  98%  Weight:        Intake/Output Summary (Last 24 hours) at 02/18/2022 1330 Last data filed at 02/18/2022 1100 Gross per 24 hour  Intake 990.18 ml  Output 200 ml  Net 790.18 ml   Filed Weights   02/16/22 1700  Weight: 65.4 kg    Examination:  General exam: Appears calm and comfortable  Respiratory system: Clear to auscultation. Respiratory effort normal. Cardiovascular system: SEM, RRR. No JVD, murmurs, rubs, gallops or clicks. No pedal edema. Gastrointestinal system: Abdomen is nondistended, soft and nontender. No organomegaly or masses felt. Normal bowel sounds heard. Central nervous system: Alert and oriented. No focal neurological deficits. Extremities: Symmetric 5 x 5 power.   Data Reviewed: I have personally reviewed following labs and imaging studies  CBC: Recent Labs  Lab 02/14/22 1652 02/15/22 0300 02/16/22 1248 02/16/22 1548 02/17/22 0834 02/18/22 0544 02/18/22 1137  WBC 4.7    < > 4.1 4.4 4.2 5.7 4.8  NEUTROABS 2.8  --   --   --   --   --   --   HGB 4.7*   < > 6.9* 7.4* 8.6* 9.1* 8.3*  HCT 18.4*   < > 23.6* 25.4* 28.8* 31.4* 29.3*  MCV 59.7*   < > 66.3* 66.3* 67.8* 69.5* 71.6*  PLT 344   < > 280 291 299 278 272   < > = values in this interval not displayed.   Basic Metabolic Panel: Recent Labs  Lab 02/14/22 1652 02/15/22 0300 02/16/22 0031 02/17/22 0834 02/18/22 0544  NA 136 134* 134* 135 136  K 4.0 3.8 3.7 3.3* 3.3*  CL 106 104 105 106 107  CO2 20* 22 22 20* 21*  GLUCOSE 100* 86 86 80 90  BUN '17 13 12 8 11  '$ CREATININE 0.46 0.40* 0.59 0.41* 0.47  CALCIUM 9.2 9.0 9.1 9.0 9.3   GFR: CrCl cannot be calculated (Unknown ideal weight.). Liver Function Tests: Recent Labs  Lab 02/14/22 1652  AST 14*  ALT 10  ALKPHOS 44  BILITOT 0.5  PROT 6.9  ALBUMIN 4.2   No results for input(s): "LIPASE", "AMYLASE" in the last 168 hours. No results for input(s): "AMMONIA"  in the last 168 hours. Coagulation Profile: No results for input(s): "INR", "PROTIME" in the last 168 hours. Cardiac Enzymes: No results for input(s): "CKTOTAL", "CKMB", "CKMBINDEX", "TROPONINI" in the last 168 hours. BNP (last 3 results) No results for input(s): "PROBNP" in the last 8760 hours. HbA1C: No results for input(s): "HGBA1C" in the last 72 hours. CBG: No results for input(s): "GLUCAP" in the last 168 hours. Lipid Profile: No results for input(s): "CHOL", "HDL", "LDLCALC", "TRIG", "CHOLHDL", "LDLDIRECT" in the last 72 hours. Thyroid Function Tests: No results for input(s): "TSH", "T4TOTAL", "FREET4", "T3FREE", "THYROIDAB" in the last 72 hours. Anemia Panel: No results for input(s): "VITAMINB12", "FOLATE", "FERRITIN", "TIBC", "IRON", "RETICCTPCT" in the last 72 hours.  Sepsis Labs: No results for input(s): "PROCALCITON", "LATICACIDVEN" in the last 168 hours.  Recent Results (from the past 240 hour(s))  Surgical pcr screen     Status: None   Collection Time: 02/16/22  6:51  AM   Specimen: Nasal Mucosa; Nasal Swab  Result Value Ref Range Status   MRSA, PCR NEGATIVE NEGATIVE Final   Staphylococcus aureus NEGATIVE NEGATIVE Final    Comment: (NOTE) The Xpert SA Assay (FDA approved for NASAL specimens in patients 31 years of age and older), is one component of a comprehensive surveillance program. It is not intended to diagnose infection nor to guide or monitor treatment. Performed at [facility], [place] [address] [place], [place] [address]          Radiology Studies: No results found.      Scheduled Meds:  amLODipine  5 mg Oral Daily   aspirin  81 mg Oral Daily   feeding supplement  237 mL Oral Daily   mupirocin ointment  1 Application Nasal BID   pantoprazole  40 mg Oral BID AC   risperiDONE  0.5 mg Oral BID   rosuvastatin  10 mg Oral QHS   Continuous Infusions:  ferric gluconate (FERRLECIT) IVPB 250 mg (02/17/22 2135)     LOS: 4 days    Time spent: 35 minutes    Lorelei Pont, MD [facility]   If 7PM-7AM, please contact night-coverage [email or web address]  02/18/2022, 1:30 PM

## 2022-02-19 DIAGNOSIS — K922 Gastrointestinal hemorrhage, unspecified: Secondary | ICD-10-CM | POA: Diagnosis not present

## 2022-02-19 LAB — CBC
HCT: 30 % — ABNORMAL LOW (ref 36.0–46.0)
Hemoglobin: 8.6 g/dL — ABNORMAL LOW (ref 12.0–15.0)
MCH: 20.5 pg — ABNORMAL LOW (ref 26.0–34.0)
MCHC: 28.7 g/dL — ABNORMAL LOW (ref 30.0–36.0)
MCV: 71.4 fL — ABNORMAL LOW (ref 80.0–100.0)
Platelets: 262 10*3/uL (ref 150–400)
RBC: 4.2 MIL/uL (ref 3.87–5.11)
RDW: 28.1 % — ABNORMAL HIGH (ref 11.5–15.5)
WBC: 4.9 10*3/uL (ref 4.0–10.5)
nRBC: 0 % (ref 0.0–0.2)

## 2022-02-19 LAB — BASIC METABOLIC PANEL
Anion gap: 8 (ref 5–15)
BUN: 14 mg/dL (ref 8–23)
CO2: 21 mmol/L — ABNORMAL LOW (ref 22–32)
Calcium: 9.2 mg/dL (ref 8.9–10.3)
Chloride: 109 mmol/L (ref 98–111)
Creatinine, Ser: 0.41 mg/dL — ABNORMAL LOW (ref 0.44–1.00)
GFR, Estimated: >60 mL/min (ref >60)
Glucose, Bld: 76 mg/dL (ref 70–99)
Potassium: 3.8 mmol/L (ref 3.5–5.1)
Sodium: 138 mmol/L (ref 135–145)

## 2022-02-19 MED ORDER — ORAL CARE MOUTH RINSE: 15.0000 mL | OROMUCOSAL | Status: DC | PRN

## 2022-02-19 MED ORDER — LIP MEDEX EX OINT
TOPICAL_OINTMENT | CUTANEOUS | Status: DC | PRN
  Administered 2022-02-19: 75 via TOPICAL
  Filled 2022-02-19: qty 7

## 2022-02-19 NOTE — TOC Progression Note (Addendum)
Transition of Care [facility]) - Progression Note    Patient Details  Name: Anacarolina Evelyn MRN: [id number] Date of Birth: [birth year]  [facility] CM/SW Contact  Kahlyn Shippey, Juliann Pulse, RN Phone Number: 02/19/2022, 12:44 PM  Clinical Narrative: Left message w/[facility]-rep Lorenza Chick await call back if can return today.Medically stable.  -1p- [facility] [address] rep Lorenza Chick can accept back tomorrow.MD updated.    Expected Discharge Plan: Long Term Nursing Home Barriers to Discharge: Continued Medical Work up  Expected Discharge Plan and Services Expected Discharge Plan: [place]                                               Social Determinants of Health (SDOH) Interventions    Readmission Risk Interventions     No data to display

## 2022-02-19 NOTE — Anesthesia Postprocedure Evaluation (Signed)
Anesthesia Post Note  Patient: [name]  Procedure(s) Performed: ESOPHAGOGASTRODUODENOSCOPY (EGD) WITH PROPOFOL COLONOSCOPY WITH PROPOFOL BIOPSY     Patient location during evaluation: Endoscopy Anesthesia Type: MAC Level of consciousness: awake and alert Pain management: pain level controlled Vital Signs Assessment: post-procedure vital signs reviewed and stable Respiratory status: spontaneous breathing, nonlabored ventilation, respiratory function stable and patient connected to nasal cannula oxygen Cardiovascular status: blood pressure returned to baseline and stable Postop Assessment: no apparent nausea or vomiting Anesthetic complications: no  No notable events documented.  Last Vitals:  Vitals:   02/19/22 1450 02/19/22 1747  BP: (!) 119/56 (!) 119/56  Pulse: 66 66  Resp: 16 16  Temp: 36.9 C 36.9 C  SpO2: 97%     Last Pain:  Vitals:   02/19/22 1747  TempSrc: Oral  PainSc:                  Shandreka Dante L Chriss Mannan

## 2022-02-19 NOTE — Plan of Care (Signed)
  Problem: Education: Goal: Knowledge of General Education information will improve Description: Including pain rating scale, medication(s)/side effects and non-pharmacologic comfort measures Outcome: Progressing   Problem: Clinical Measurements: Goal: Will remain free from infection Outcome: Progressing Goal: Diagnostic test results will improve Outcome: Progressing   Problem: Safety: Goal: Ability to remain free from injury will improve Outcome: Progressing

## 2022-02-19 NOTE — Progress Notes (Signed)
PROGRESS NOTE    [name]  VHQ:[id number] DOB: 1940/08/29 DOA: 02/14/2022 PCP: Jon Billings, NP   Brief Narrative: 81 year old with past medical history significant for hypertension, aortic valve stenosis, CVA, schizoaffective disorder, hyperlipidemia, anxiety, depression, presents with anemia.  Patient had labs drawn at her facility [facility] and was found to have a hemoglobin of 4.9.  She was sent to the ED for further evaluation.  Hemoglobin in the ED was consistent with 4.7.  Prior hemoglobin baseline around 8-9.  FOBT positive.  Last colonoscopy 2009 with adenomatous polyps, sigmoid diverticulosis.  Patient received 2 units of packed red blood cells on admission.  GI was consulted.  Repeated hemoglobin down to 6.9.  She received another unit of 12/8.  Repeated hemoglobin around 8.   GI recommended endoscopy and colonoscopy, completed 12/8 with finding of superficial gastric ulcers and started on PPI. Hb 12/9 9 and then 8.  Assessment & Plan:   Principal Problem:   Acute GI bleeding Active Problems:   Schizoaffective disorder (HCC)   Essential hypertension   History of CVA (cerebrovascular accident)   Symptomatic anemia   Dysphagia   Multiple gastric ulcers  Acute on chronic microcytic anemia, iron deficiency anemia Acute on chronic GI bleeding Presented with a hemoglobin of 4.7, FOBT positive.Remote colonoscopy 2009 with adenomatous polyps and sigmoid diverticulosis. Anemia panel: Iron 8, ferritin 2, TIBC 514. She received 2 units of packed red blood cells this  admission.GI consulted.  Patient underwent endoscopy and colonoscopy 12/8.Her hemoglobins have remained stable at this time without recurrence of dark stools. Colonoscopy: diverticulosis, non bleeding external and internal hemorrhoids.  Otherwise normal examination. Endoscopy: Z-line was regular, was found 36 cm incisors.  Examined 6 esophagus was normal.  Large hiatal hernia was present.  Few none  bleeding cratered and superficial gastric ulcer with no stigmata of bleeding were found in the gastric antrum and in the prepyloric region of the stomach.  Biopsy was taken.  Diffuse mucosal flattening was found in the first portion of the Duodenum  and the second portion.   -PPI PO BID -trend CBC until stable   Schizoaffective disorder: On risperidone.   History of CVA: per GI ok to resume aspirin, enteric-coated aspirin  Essential hypertension: resume Norvasc.  SEM Suspect AS.    Pressure Injury 12/11/20 Buttocks Right Stage 1 -  Intact skin with non-blanchable redness of a localized area usually over a bony prominence. (Active)  12/11/20 0815  Location: Buttocks  Location Orientation: Right  Staging: Stage 1 -  Intact skin with non-blanchable redness of a localized area usually over a bony prominence.  Wound Description (Comments):   Present on Admission: No     Pressure Injury 12/11/20 Buttocks Left Stage 1 -  Intact skin with non-blanchable redness of a localized area usually over a bony prominence. (Active)  12/11/20 0815  Location: Buttocks  Location Orientation: Left  Staging: Stage 1 -  Intact skin with non-blanchable redness of a localized area usually over a bony prominence.  Wound Description (Comments):   Present on Admission: No     Pressure Injury 12/22/20 Coccyx Medial Stage 2 -  Partial thickness loss of dermis presenting as a shallow open injury with a red, pink wound bed without slough. (Active)  12/22/20 2310  Location: Coccyx  Location Orientation: Medial  Staging: Stage 2 -  Partial thickness loss of dermis presenting as a shallow open injury with a red, pink wound bed without slough.  Wound Description (Comments):  Present on Admission: Yes     Pressure Injury 12/22/20 Ankle Posterior;Right Deep Tissue Pressure Injury - Purple or maroon localized area of discolored intact skin or blood-filled blister due to damage of underlying soft tissue from pressure  and/or shear. (Active)  12/22/20 2310  Location: Ankle  Location Orientation: Posterior;Right  Staging: Deep Tissue Pressure Injury - Purple or maroon localized area of discolored intact skin or blood-filled blister due to damage of underlying soft tissue from pressure and/or shear.  Wound Description (Comments):   Present on Admission: Yes     Estimated body mass index is 25.54 kg/m as calculated from the following:   Height as of 02/14/21: '5\' 3"'$  (1.6 m).   Weight as of this encounter: 65.4 kg.   DVT prophylaxis: SCD Code Status: DNR Family Communication: care discussed with patient.  Disposition Plan:  Status is: Inpatient Remains inpatient appropriate because: management of anemia Consultants:  GI  Procedures:  Endoscopy, colonoscopy   Antimicrobials:   Subjective: No complaints this morning.   Objective: Vitals:   02/18/22 1142 02/18/22 2055 02/19/22 0440 02/19/22 0836  BP: (!) 110/43 (!) 124/54 (!) 145/58 (!) 146/52  Pulse: 69 68 67   Resp: (!) '21 16 18   '$ Temp: 98.1 F (36.7 C) 97.8 F (36.6 C) 98 F (36.7 C)   TempSrc:  Oral Oral   SpO2: 98% 98% 99%   Weight:        Intake/Output Summary (Last 24 hours) at 02/19/2022 1332 Last data filed at 02/19/2022 5916 Gross per 24 hour  Intake 240 ml  Output 400 ml  Net -160 ml   Filed Weights   02/16/22 1700  Weight: 65.4 kg    Examination:  General exam: Appears calm and comfortable  Respiratory system: Clear to auscultation. Respiratory effort normal. Cardiovascular system: SEM, RRR. No JVD, murmurs, rubs, gallops or clicks. No pedal edema. Gastrointestinal system: Abdomen is nondistended, soft and nontender. No organomegaly or masses felt. Normal bowel sounds heard. Central nervous system: Alert and oriented. No focal neurological deficits. Extremities: Symmetric 5 x 5 power.   Data Reviewed: I have personally reviewed following labs and imaging studies  CBC: Recent Labs  Lab 02/14/22 1652  02/15/22 0300 02/16/22 1548 02/17/22 0834 02/18/22 0544 02/18/22 1137 02/19/22 0458  WBC 4.7   < > 4.4 4.2 5.7 4.8 4.9  NEUTROABS 2.8  --   --   --   --   --   --   HGB 4.7*   < > 7.4* 8.6* 9.1* 8.3* 8.6*  HCT 18.4*   < > 25.4* 28.8* 31.4* 29.3* 30.0*  MCV 59.7*   < > 66.3* 67.8* 69.5* 71.6* 71.4*  PLT 344   < > 291 299 278 272 262   < > = values in this interval not displayed.   Basic Metabolic Panel: Recent Labs  Lab 02/15/22 0300 02/16/22 0031 02/17/22 0834 02/18/22 0544 02/19/22 0458  NA 134* 134* 135 136 138  K 3.8 3.7 3.3* 3.3* 3.8  CL 104 105 106 107 109  CO2 22 22 20* 21* 21*  GLUCOSE 86 86 80 90 76  BUN '13 12 8 11 14  '$ CREATININE 0.40* 0.59 0.41* 0.47 0.41*  CALCIUM 9.0 9.1 9.0 9.3 9.2   GFR: CrCl cannot be calculated (Unknown ideal weight.). Liver Function Tests: Recent Labs  Lab 02/14/22 1652  AST 14*  ALT 10  ALKPHOS 44  BILITOT 0.5  PROT 6.9  ALBUMIN 4.2   No results for input(s): "  LIPASE", "AMYLASE" in the last 168 hours. No results for input(s): "AMMONIA" in the last 168 hours. Coagulation Profile: No results for input(s): "INR", "PROTIME" in the last 168 hours. Cardiac Enzymes: No results for input(s): "CKTOTAL", "CKMB", "CKMBINDEX", "TROPONINI" in the last 168 hours. BNP (last 3 results) No results for input(s): "PROBNP" in the last 8760 hours. HbA1C: No results for input(s): "HGBA1C" in the last 72 hours. CBG: No results for input(s): "GLUCAP" in the last 168 hours. Lipid Profile: No results for input(s): "CHOL", "HDL", "LDLCALC", "TRIG", "CHOLHDL", "LDLDIRECT" in the last 72 hours. Thyroid Function Tests: No results for input(s): "TSH", "T4TOTAL", "FREET4", "T3FREE", "THYROIDAB" in the last 72 hours. Anemia Panel: No results for input(s): "VITAMINB12", "FOLATE", "FERRITIN", "TIBC", "IRON", "RETICCTPCT" in the last 72 hours.  Sepsis Labs: No results for input(s): "PROCALCITON", "LATICACIDVEN" in the last 168 hours.  Recent Results  (from the past 240 hour(s))  Surgical pcr screen     Status: None   Collection Time: 02/16/22  6:51 AM   Specimen: Nasal Mucosa; Nasal Swab  Result Value Ref Range Status   MRSA, PCR NEGATIVE NEGATIVE Final   Staphylococcus aureus NEGATIVE NEGATIVE Final    Comment: (NOTE) The Xpert SA Assay (FDA approved for NASAL specimens in patients 55 years of age and older), is one component of a comprehensive surveillance program. It is not intended to diagnose infection nor to guide or monitor treatment. Performed at [facility], [place] [address] [place], [place] [address]          Radiology Studies: No results found.    Scheduled Meds:  amLODipine  5 mg Oral Daily   aspirin EC  81 mg Oral Daily   feeding supplement  237 mL Oral Daily   mupirocin ointment  1 Application Nasal BID   pantoprazole  40 mg Oral BID AC   risperiDONE  0.5 mg Oral BID   rosuvastatin  10 mg Oral QHS   Continuous Infusions:    LOS: 5 days    Time spent: 35 minutes   Lorelei Pont, MD [facility]   If 7PM-7AM, please contact night-coverage [email or web address]  02/19/2022, 1:32 PM

## 2022-02-20 ENCOUNTER — Encounter (HOSPITAL_COMMUNITY): Payer: Self-pay | Admitting: Gastroenterology

## 2022-02-20 DIAGNOSIS — K922 Gastrointestinal hemorrhage, unspecified: Secondary | ICD-10-CM | POA: Diagnosis not present

## 2022-02-20 LAB — SURGICAL PATHOLOGY

## 2022-02-20 LAB — BASIC METABOLIC PANEL
Anion gap: 9 (ref 5–15)
BUN: 13 mg/dL (ref 8–23)
CO2: 21 mmol/L — ABNORMAL LOW (ref 22–32)
Calcium: 9.2 mg/dL (ref 8.9–10.3)
Chloride: 106 mmol/L (ref 98–111)
Creatinine, Ser: 0.45 mg/dL (ref 0.44–1.00)
GFR, Estimated: >60 mL/min (ref >60)
Glucose, Bld: 81 mg/dL (ref 70–99)
Potassium: 3.9 mmol/L (ref 3.5–5.1)
Sodium: 136 mmol/L (ref 135–145)

## 2022-02-20 MED ORDER — PANTOPRAZOLE SODIUM 40 MG PO TBEC: 40.0000 mg | DELAYED_RELEASE_TABLET | Freq: Two times a day (BID) | ORAL | Status: AC

## 2022-02-20 MED ORDER — FERROUS SULFATE 325 (65 FE) MG PO TABS: 325.0000 mg | ORAL_TABLET | Freq: Every day | ORAL | 3 refills | Status: AC

## 2022-02-20 MED ORDER — SODIUM CHLORIDE 0.9 % IV SOLN
250.0000 mg | Freq: Every day | INTRAVENOUS | Status: DC
  Administered 2022-02-20: 250 mg via INTRAVENOUS
  Filled 2022-02-20: qty 20

## 2022-02-20 NOTE — Care Management Important Message (Signed)
Important Message  Patient Details IM Letter given. Name: [name] MRN: [id number] Date of Birth: Jul 11, 1940   Medicare Important Message Given:  Yes     Kerin Salen 02/20/2022, 2:55 PM

## 2022-02-20 NOTE — TOC Transition Note (Signed)
Transition of Care [facility]) - CM/SW Discharge Note   Patient Details  Name: [name] MRN: [id number] Date of Birth: 05-May-1940  Transition of Care [facility]) CM/SW Contact:  Amador Cunas, [place] Phone Number: 02/20/2022, 1:39 PM   Clinical Narrative:   Pt for dc back to [facility] where she is a LTC resident. Spoke to [facility] in admissions who confirmed they are prepared to admit pt to room 408P. Pt's dtr Caryl Pina aware of dc and reports agreeable. RN provided with number for report and [facility] arranged for transport, requested 1530 pick up time. SW signing off at dc.   Wandra Feinstein, MSW, LCSW [phone] (coverage)     Final next level of care: Skilled Nursing Facility Barriers to Discharge: No Barriers Identified   Patient Goals and CMS Choice     Choice offered to / list presented to : NA  Discharge Placement              Patient chooses bed at: [facility] Patient to be transferred to facility by: [place] Name of family member notified: Ashley/dtr Patient and family notified of of transfer: 02/20/22  Discharge Plan and Services                                     Social Determinants of Health (SDOH) Interventions     Readmission Risk Interventions     No data to display

## 2022-02-20 NOTE — Discharge Summary (Signed)
Physician Discharge Summary  [name] SKA:[id number] DOB: 1940/07/17 DOA: 02/14/2022  PCP: Jon Billings, NP  Admit date: 02/14/2022 Discharge date: 02/20/2022  Admitted From: Long-term resident at [facility] Discharge disposition: Back to same facility  Recommendations at discharge:  Been started on oral Protonix twice a day as well as oral iron replacement  Brief narrative: [name] is a 81 y.o. female with PMH significant for HTN, HLD, CVA, schizoaffective disorder, anxiety/depression who lives at [facility]. 12/5, patient was sent to the ED after routine blood work at the facility showed a hemoglobin low at 4.9. Last colonoscopy 2009 with adenomatous polyps, sigmoid diverticulosis.  In the ED, hemoglobin was consistent with a low reading at 4.7.  Baseline hemoglobin 8.9. FOBT negative Patient was given 2 units of PRBC transfusion Admitted to [facility] GI consulted See below for details  Subjective: Patient was seen and examined this morning.  Pleasant elderly Caucasian female.  Not in physical distress.  Extremely hard of hearing Chart reviewed Afebrile, hemodynamically stable, breathing on room air  Hospital course: Acute on chronic microcytic anemia Severe iron deficiency anemia Sent from SNF with low hemoglobin.  EGD and colonoscopy findings as below Received a total of 3 units of PRBC transfusion this hospitalization.  Hemoglobin at 8.6 on last check yesterday. Severe low ferritin level at 2.  IV iron replacement given x 2 Started on oral iron replacement at discharge. Recent Labs    02/15/22 0300 02/15/22 1442 02/16/22 1548 02/17/22 0834 02/18/22 0544 02/18/22 1137 02/19/22 0458  HGB 4.8*   < > 7.4* 8.6* 9.1* 8.3* 8.6*  MCV 59.0*   < > 66.3* 67.8* 69.5* 71.6* 71.4*  FERRITIN 2*  --   --   --   --   --   --   TIBC 514*  --   --   --   --   --   --   IRON 8*  --   --   --   --   --   --    < > = values in this interval not  displayed.   Acute on chronic GI bleeding 12/8, underwent EGD, colonoscopy  Colonoscopy: diverticulosis, non bleeding external and internal hemorrhoids.  Endoscopy:  Large hiatal hernia. Few none bleeding cratered and superficial gastric ulcer with no stigmata of bleeding were found in the gastric antrum and in the prepyloric region of the stomach.  Biopsy was taken.   Continue PPI PO BID. Follow-up with GI as an outpatient.  Schizoaffective disorder On risperidone.    History of CVA per GI ok to resume aspirin, enteric-coated aspirin Continue Lipitor   Essential hypertension  PTA on amlodipine 5 mg daily and as needed.  She is also on Coreg as needed??  Wounds:  - Incision (Closed) 12/02/20 Groin Right (Active)  Date First Assessed/Time First Assessed: 12/02/20 2031   Location: Groin  Location Orientation: Right    Assessments 12/02/2020  8:00 PM 12/12/2020  8:00 PM  Dressing Type Opsite Post-Op Visible Other (Comment)  Site / Wound Assessment -- Clean;Dry  Closure Staples --     No associated orders.     Incision (Closed) 12/07/20 Knee Right (Active)  Date First Assessed/Time First Assessed: 12/07/20 1537   Location: Knee  Location Orientation: Right    Assessments 12/07/2020  3:45 PM 12/10/2020  8:00 PM  Dressing Type None --  Drainage Amount None None     No associated orders.     Pressure Injury  12/11/20 Buttocks Right Stage 1 -  Intact skin with non-blanchable redness of a localized area usually over a bony prominence. (Active)  Date First Assessed/Time First Assessed: 12/11/20 0815   Location: Buttocks  Location Orientation: Right  Staging: Stage 1 -  Intact skin with non-blanchable redness of a localized area usually over a bony prominence.  Present on Admission: No    Assessments 12/11/2020  8:30 AM 12/27/2020 12:00 PM  Dressing Type Foam - Lift dressing to assess site every shift Foam - Lift dressing to assess site every shift  Dressing Changed Clean;Dry;Intact   Dressing Change Frequency PRN PRN  Site / Wound Assessment Red --  % Wound base Red or Granulating 100% --  Peri-wound Assessment Intact --  Wound Length (cm) 9.5 cm --  Wound Width (cm) 5 cm --  Wound Surface Area (cm^2) 47.5 cm^2 --  Drainage Amount None --  Treatment Other (Comment) --     No associated orders.     Pressure Injury 12/11/20 Buttocks Left Stage 1 -  Intact skin with non-blanchable redness of a localized area usually over a bony prominence. (Active)  Date First Assessed/Time First Assessed: 12/11/20 0815   Location: Buttocks  Location Orientation: Left  Staging: Stage 1 -  Intact skin with non-blanchable redness of a localized area usually over a bony prominence.  Present on Admission: No    Assessments 12/11/2020  8:30 AM 12/26/2020  8:30 AM  Dressing Type Foam - Lift dressing to assess site every shift --  Dressing Changed Clean;Dry;Intact  Dressing Change Frequency PRN --  Site / Wound Assessment Red --  % Wound base Red or Granulating 100% --  Peri-wound Assessment Intact --  Wound Length (cm) 7 cm --  Wound Width (cm) 5 cm --  Wound Surface Area (cm^2) 35 cm^2 --  Drainage Amount None --  Treatment Other (Comment) --     No associated orders.     Pressure Injury 12/22/20 Coccyx Medial Stage 2 -  Partial thickness loss of dermis presenting as a shallow open injury with a red, pink wound bed without slough. (Active)  Date First Assessed/Time First Assessed: 12/22/20 2310   Location: Coccyx  Location Orientation: Medial  Staging: Stage 2 -  Partial thickness loss of dermis presenting as a shallow open injury with a red, pink wound bed without slough.  Present on Ad...    Assessments 12/22/2020 11:10 PM 12/27/2020 12:00 PM  Dressing Type Foam - Lift dressing to assess site every shift Foam - Lift dressing to assess site every shift  Dressing Clean;Dry;Intact Clean;Dry;Intact  Dressing Change Frequency -- Every 3 days  Site / Wound Assessment -- Clean;Dry;Pink   Peri-wound Assessment -- Intact  Drainage Amount -- None  Treatment -- Off loading     No associated orders.     Pressure Injury 12/22/20 Ankle Posterior;Right Deep Tissue Pressure Injury - Purple or maroon localized area of discolored intact skin or blood-filled blister due to damage of underlying soft tissue from pressure and/or shear. (Active)  Date First Assessed/Time First Assessed: 12/22/20 2310   Location: Ankle  Location Orientation: Posterior;Right  Staging: Deep Tissue Pressure Injury - Purple or maroon localized area of discolored intact skin or blood-filled blister due to damage of ...    Assessments 12/22/2020 11:10 PM 12/27/2020 12:00 PM  Dressing Type Foam - Lift dressing to assess site every shift Foam - Lift dressing to assess site every shift  Dressing Clean;Dry;Intact Clean;Dry;Intact  Dressing Change Frequency -- PRN  Site / Wound Assessment -- Dressing in place / Unable to assess  Peri-wound Assessment -- Intact  Drainage Amount -- None  Treatment Off loading Off loading     No associated orders.     Incision (Closed) 12/24/20 Knee Right (Active)  Date First Assessed/Time First Assessed: 12/24/20 1348   Location: Knee  Location Orientation: Right    Assessments 12/24/2020  3:10 PM 12/26/2020  8:30 AM  Dressing Type Abdominal pads --  Dressing Clean;Dry;Intact Clean;Dry;Intact  Site / Wound Assessment Dressing in place / Unable to assess --  Drainage Amount None --     No associated orders.    Discharge Exam:   Vitals:   02/19/22 1747 02/19/22 2034 02/20/22 0615 02/20/22 1250  BP: (!) 119/56 (!) 148/62 (!) 147/61 (!) 118/59  Pulse: 66 67 65 70  Resp: '16 17 17 16  '$ Temp: 98.4 F (36.9 C) 98 F (36.7 C) 97.8 F (36.6 C) (!) 97.4 F (36.3 C)  TempSrc: Oral Oral Axillary   SpO2:  98% 97% 98%  Weight: 65.5 kg     Height: '5\' 5"'$  (1.651 m)       Body mass index is 24.03 kg/m.  Mcguire exam: Pleasant, elderly Caucasian female.  Not in distress Skin: No  rashes, lesions or ulcers. HEENT: Atraumatic, normocephalic, no obvious bleeding Lungs: Clear to auscultation bilaterally CVS: Regular rate and rhythm, no murmur GI/Abd soft, nontender, nondistended, bowels are present CNS: Alert, awake, oriented x 3.  Extremely hard of hearing Psychiatry: Mood appropriate Extremities: No pedal edema, no calf tenderness  Follow ups:    Contact information for after-discharge care     Destination     [facility] .   Service: Skilled Nursing Contact information: [address] [place] [phone]                     Discharge Instructions:   Discharge Instructions     Call MD for:  difficulty breathing, headache or visual disturbances   Complete by: As directed    Call MD for:  extreme fatigue   Complete by: As directed    Call MD for:  hives   Complete by: As directed    Call MD for:  persistant dizziness or light-headedness   Complete by: As directed    Call MD for:  persistant nausea and vomiting   Complete by: As directed    Call MD for:  severe uncontrolled pain   Complete by: As directed    Call MD for:  temperature >100.4   Complete by: As directed    Diet - low sodium heart healthy   Complete by: As directed    Discharge instructions   Complete by: As directed    Recommendations at discharge:   Been started on oral Protonix twice a day as well as oral iron replacement  Mcguire discharge instructions: Follow with Primary MD Jon Billings, NP in 7 days  Please request your PCP  to go over your hospital tests, procedures, radiology results at the follow up. Please get your medicines reviewed and adjusted.  Your PCP may decide to repeat certain labs or tests as needed. Do not drive, operate heavy machinery, perform activities at heights, swimming or participation in water activities or provide baby sitting services if your were admitted for syncope or siezures until you  have seen by Primary MD or a Neurologist and advised to do so again. [place] Controlled Substance Reporting  System database was reviewed. Do not drive, operate heavy machinery, perform activities at heights, swim, participate in water activities or provide baby-sitting services while on medications for pain, sleep and mood until your outpatient physician has reevaluated you and advised to do so again.  You are strongly recommended to comply with the dose, frequency and duration of prescribed medications. Activity: As tolerated with Full fall precautions use walker/cane & assistance as needed Avoid using any recreational substances like cigarette, tobacco, alcohol, or non-prescribed drug. If you experience worsening of your admission symptoms, develop shortness of breath, life threatening emergency, suicidal or homicidal thoughts you must seek medical attention immediately by calling 911 or calling your MD immediately  if symptoms less severe. You must read complete instructions/literature along with all the possible adverse reactions/side effects for all the medicines you take and that have been prescribed to you. Take any new medicine only after you have completely understood and accepted all the possible adverse reactions/side effects.  Wear Seat belts while driving. You were cared for by a hospitalist during your hospital stay. If you have any questions about your discharge medications or the care you received while you were in the hospital after you are discharged, you can call the unit and ask to speak with the hospitalist or the covering physician. Once you are discharged, your primary care physician will handle any further medical issues. Please note that NO REFILLS for any discharge medications will be authorized once you are discharged, as it is imperative that you return to your primary care physician (or establish a relationship with a primary care physician if you do not have one).    Increase activity slowly   Complete by: As directed        Discharge Medications:   Allergies as of 02/20/2022       Reactions   Penicillins Rash, Other (See Comments)   TOLERATED CEFAZOLIN        Medication List     STOP taking these medications    gabapentin 100 MG capsule Commonly known as: NEURONTIN   tiZANidine 4 MG tablet Commonly known as: ZANAFLEX   traMADol 50 MG tablet Commonly known as: ULTRAM   triamcinolone cream 0.1 % Commonly known as: KENALOG       TAKE these medications    acetaminophen 500 MG tablet Commonly known as: TYLENOL Take 1,000 mg by mouth 2 (two) times daily.   amLODipine 5 MG tablet Commonly known as: NORVASC Take 5 mg by mouth See admin instructions. Take 5 mg by mouth once a day and hold for a Systolic reading <846 What changed: Another medication with the same name was removed. Continue taking this medication, and follow the directions you see here.   aspirin 81 MG chewable tablet Chew 1 tablet (81 mg total) by mouth daily.   Biofreeze 4 % Gel Generic drug: Menthol (Topical Analgesic) Apply 1 application  topically See admin instructions. Apply to neck and knees 2 times a day   Biotene Dry Mouth Moisturizing Soln Use as directed 1 spray in the mouth or throat every other day.   carvedilol 6.25 MG tablet Commonly known as: COREG Take 1 tablet (6.25 mg total) by mouth 2 (two) times daily with a meal. What changed:  when to take this reasons to take this   DECUBI-VITE PO Take 1 capsule by mouth daily.   diclofenac Sodium 1 % Gel Commonly known as: VOLTAREN Apply 2 g topically See admin instructions. Apply 2 grams to joints/  knuckles- right hand   ferrous sulfate 325 (65 FE) MG tablet Take 1 tablet (325 mg total) by mouth daily.   lidocaine 4 % cream Commonly known as: LMX Apply 1 Application topically See admin instructions. Apply to both feet at bedtime   methocarbamol 500 MG tablet Commonly known as:  ROBAXIN Take 250-500 mg by mouth See admin instructions. Take 250 mg by mouth in the morning and 500 mg at bedtime   Nutritional Supplement Liqd Take 120 mLs by mouth 2 (two) times daily.   nystatin powder Commonly known as: MYCOSTATIN/NYSTOP Apply 1 application  topically See admin instructions. Apply to buttocks/sacral area 2 times a day after washing/drying the area   pantoprazole 40 MG tablet Commonly known as: PROTONIX Take 1 tablet (40 mg total) by mouth 2 (two) times daily before a meal.   polyethylene glycol 17 g packet Commonly known as: MIRALAX / GLYCOLAX Take 17 g by mouth daily.   risperiDONE 0.5 MG tablet Commonly known as: RISPERDAL Take 0.5 mg by mouth 2 (two) times daily.   rosuvastatin 10 MG tablet Commonly known as: Crestor Take 1 tablet (10 mg total) by mouth daily. What changed: when to take this   sennosides-docusate sodium 8.6-50 MG tablet Commonly known as: SENOKOT-S Take 1 tablet by mouth in the morning and at bedtime.         The results of significant diagnostics from this hospitalization (including imaging, microbiology, ancillary and laboratory) are listed below for reference.    Procedures and Diagnostic Studies:   No results found.   Labs:   Basic Metabolic Panel: Recent Labs  Lab 02/16/22 0031 02/17/22 0834 02/18/22 0544 02/19/22 0458 02/20/22 0501  NA 134* 135 136 138 136  K 3.7 3.3* 3.3* 3.8 3.9  CL 105 106 107 109 106  CO2 22 20* 21* 21* 21*  GLUCOSE 86 80 90 76 81  BUN '12 8 11 14 13  '$ CREATININE 0.59 0.41* 0.47 0.41* 0.45  CALCIUM 9.1 9.0 9.3 9.2 9.2   GFR Estimated Creatinine Clearance: 48.8 mL/min (by C-G formula based on SCr of 0.45 mg/dL). Liver Function Tests: Recent Labs  Lab 02/14/22 1652  AST 14*  ALT 10  ALKPHOS 44  BILITOT 0.5  PROT 6.9  ALBUMIN 4.2   No results for input(s): "LIPASE", "AMYLASE" in the last 168 hours. No results for input(s): "AMMONIA" in the last 168 hours. Coagulation  profile No results for input(s): "INR", "PROTIME" in the last 168 hours.  CBC: Recent Labs  Lab 02/14/22 1652 02/15/22 0300 02/16/22 1548 02/17/22 0834 02/18/22 0544 02/18/22 1137 02/19/22 0458  WBC 4.7   < > 4.4 4.2 5.7 4.8 4.9  NEUTROABS 2.8  --   --   --   --   --   --   HGB 4.7*   < > 7.4* 8.6* 9.1* 8.3* 8.6*  HCT 18.4*   < > 25.4* 28.8* 31.4* 29.3* 30.0*  MCV 59.7*   < > 66.3* 67.8* 69.5* 71.6* 71.4*  PLT 344   < > 291 299 278 272 262   < > = values in this interval not displayed.   Cardiac Enzymes: No results for input(s): "CKTOTAL", "CKMB", "CKMBINDEX", "TROPONINI" in the last 168 hours. BNP: Invalid input(s): "POCBNP" CBG: No results for input(s): "GLUCAP" in the last 168 hours. D-Dimer No results for input(s): "DDIMER" in the last 72 hours. Hgb A1c No results for input(s): "HGBA1C" in the last 72 hours. Lipid Profile No results for input(s): "CHOL", "  HDL", "LDLCALC", "TRIG", "CHOLHDL", "LDLDIRECT" in the last 72 hours. Thyroid function studies No results for input(s): "TSH", "T4TOTAL", "T3FREE", "THYROIDAB" in the last 72 hours.  Invalid input(s): "FREET3" Anemia work up No results for input(s): "VITAMINB12", "FOLATE", "FERRITIN", "TIBC", "IRON", "RETICCTPCT" in the last 72 hours. Microbiology Recent Results (from the past 240 hour(s))  Surgical pcr screen     Status: None   Collection Time: 02/16/22  6:51 AM   Specimen: Nasal Mucosa; Nasal Swab  Result Value Ref Range Status   MRSA, PCR NEGATIVE NEGATIVE Final   Staphylococcus aureus NEGATIVE NEGATIVE Final    Comment: (NOTE) The Xpert SA Assay (FDA approved for NASAL specimens in patients 72 years of age and older), is one component of a comprehensive surveillance program. It is not intended to diagnose infection nor to guide or monitor treatment. Performed at [facility], [place] [address] [place], [place] [address]     Time coordinating discharge: 35 minutes  Signed: Marlowe Aschoff  Elier Zellars  [facility] 02/20/2022, 1:00 PM

## 2022-02-20 NOTE — Plan of Care (Signed)

## 2022-02-20 NOTE — Progress Notes (Signed)
IV x 2 and telemetry monitor removed for DC to SNF, patient's belongings on patient, PTAR to transport to [facility]

## 2022-02-21 DIAGNOSIS — R293 Abnormal posture: Secondary | ICD-10-CM | POA: Diagnosis not present

## 2022-02-21 DIAGNOSIS — R2681 Unsteadiness on feet: Secondary | ICD-10-CM | POA: Diagnosis not present

## 2022-02-21 DIAGNOSIS — R262 Difficulty in walking, not elsewhere classified: Secondary | ICD-10-CM | POA: Diagnosis not present

## 2022-02-21 DIAGNOSIS — R278 Other lack of coordination: Secondary | ICD-10-CM | POA: Diagnosis not present

## 2022-02-21 DIAGNOSIS — M24562 Contracture, left knee: Secondary | ICD-10-CM | POA: Diagnosis not present

## 2022-02-21 DIAGNOSIS — M6281 Muscle weakness (generalized): Secondary | ICD-10-CM | POA: Diagnosis not present

## 2022-02-21 DIAGNOSIS — G8194 Hemiplegia, unspecified affecting left nondominant side: Secondary | ICD-10-CM | POA: Diagnosis not present

## 2022-02-21 DIAGNOSIS — M24561 Contracture, right knee: Secondary | ICD-10-CM | POA: Diagnosis not present

## 2022-02-21 DIAGNOSIS — D649 Anemia, unspecified: Secondary | ICD-10-CM | POA: Diagnosis not present

## 2022-02-22 DIAGNOSIS — R262 Difficulty in walking, not elsewhere classified: Secondary | ICD-10-CM | POA: Diagnosis not present

## 2022-02-22 DIAGNOSIS — R278 Other lack of coordination: Secondary | ICD-10-CM | POA: Diagnosis not present

## 2022-02-22 DIAGNOSIS — M24562 Contracture, left knee: Secondary | ICD-10-CM | POA: Diagnosis not present

## 2022-02-22 DIAGNOSIS — M6281 Muscle weakness (generalized): Secondary | ICD-10-CM | POA: Diagnosis not present

## 2022-02-22 DIAGNOSIS — D649 Anemia, unspecified: Secondary | ICD-10-CM | POA: Diagnosis not present

## 2022-02-22 DIAGNOSIS — G8194 Hemiplegia, unspecified affecting left nondominant side: Secondary | ICD-10-CM | POA: Diagnosis not present

## 2022-02-22 DIAGNOSIS — M24561 Contracture, right knee: Secondary | ICD-10-CM | POA: Diagnosis not present

## 2022-02-22 DIAGNOSIS — R293 Abnormal posture: Secondary | ICD-10-CM | POA: Diagnosis not present

## 2022-02-22 DIAGNOSIS — R2681 Unsteadiness on feet: Secondary | ICD-10-CM | POA: Diagnosis not present

## 2022-02-23 DIAGNOSIS — I739 Peripheral vascular disease, unspecified: Secondary | ICD-10-CM | POA: Diagnosis not present

## 2022-02-23 DIAGNOSIS — R278 Other lack of coordination: Secondary | ICD-10-CM | POA: Diagnosis not present

## 2022-02-23 DIAGNOSIS — M24562 Contracture, left knee: Secondary | ICD-10-CM | POA: Diagnosis not present

## 2022-02-23 DIAGNOSIS — R293 Abnormal posture: Secondary | ICD-10-CM | POA: Diagnosis not present

## 2022-02-23 DIAGNOSIS — R2681 Unsteadiness on feet: Secondary | ICD-10-CM | POA: Diagnosis not present

## 2022-02-23 DIAGNOSIS — B351 Tinea unguium: Secondary | ICD-10-CM | POA: Diagnosis not present

## 2022-02-23 DIAGNOSIS — R262 Difficulty in walking, not elsewhere classified: Secondary | ICD-10-CM | POA: Diagnosis not present

## 2022-02-23 DIAGNOSIS — L603 Nail dystrophy: Secondary | ICD-10-CM | POA: Diagnosis not present

## 2022-02-23 DIAGNOSIS — M24561 Contracture, right knee: Secondary | ICD-10-CM | POA: Diagnosis not present

## 2022-02-23 DIAGNOSIS — G8194 Hemiplegia, unspecified affecting left nondominant side: Secondary | ICD-10-CM | POA: Diagnosis not present

## 2022-02-23 DIAGNOSIS — D649 Anemia, unspecified: Secondary | ICD-10-CM | POA: Diagnosis not present

## 2022-02-23 DIAGNOSIS — M6281 Muscle weakness (generalized): Secondary | ICD-10-CM | POA: Diagnosis not present

## 2022-02-24 DIAGNOSIS — R262 Difficulty in walking, not elsewhere classified: Secondary | ICD-10-CM | POA: Diagnosis not present

## 2022-02-24 DIAGNOSIS — M6281 Muscle weakness (generalized): Secondary | ICD-10-CM | POA: Diagnosis not present

## 2022-02-24 DIAGNOSIS — M24562 Contracture, left knee: Secondary | ICD-10-CM | POA: Diagnosis not present

## 2022-02-24 DIAGNOSIS — R2681 Unsteadiness on feet: Secondary | ICD-10-CM | POA: Diagnosis not present

## 2022-02-24 DIAGNOSIS — D649 Anemia, unspecified: Secondary | ICD-10-CM | POA: Diagnosis not present

## 2022-02-24 DIAGNOSIS — R293 Abnormal posture: Secondary | ICD-10-CM | POA: Diagnosis not present

## 2022-02-24 DIAGNOSIS — G8194 Hemiplegia, unspecified affecting left nondominant side: Secondary | ICD-10-CM | POA: Diagnosis not present

## 2022-02-24 DIAGNOSIS — R278 Other lack of coordination: Secondary | ICD-10-CM | POA: Diagnosis not present

## 2022-02-24 DIAGNOSIS — M24561 Contracture, right knee: Secondary | ICD-10-CM | POA: Diagnosis not present

## 2022-02-25 DIAGNOSIS — R293 Abnormal posture: Secondary | ICD-10-CM | POA: Diagnosis not present

## 2022-02-25 DIAGNOSIS — R278 Other lack of coordination: Secondary | ICD-10-CM | POA: Diagnosis not present

## 2022-02-25 DIAGNOSIS — D649 Anemia, unspecified: Secondary | ICD-10-CM | POA: Diagnosis not present

## 2022-02-25 DIAGNOSIS — M24562 Contracture, left knee: Secondary | ICD-10-CM | POA: Diagnosis not present

## 2022-02-25 DIAGNOSIS — R262 Difficulty in walking, not elsewhere classified: Secondary | ICD-10-CM | POA: Diagnosis not present

## 2022-02-25 DIAGNOSIS — G8194 Hemiplegia, unspecified affecting left nondominant side: Secondary | ICD-10-CM | POA: Diagnosis not present

## 2022-02-25 DIAGNOSIS — M24561 Contracture, right knee: Secondary | ICD-10-CM | POA: Diagnosis not present

## 2022-02-25 DIAGNOSIS — M6281 Muscle weakness (generalized): Secondary | ICD-10-CM | POA: Diagnosis not present

## 2022-02-25 DIAGNOSIS — R2681 Unsteadiness on feet: Secondary | ICD-10-CM | POA: Diagnosis not present

## 2022-02-27 DIAGNOSIS — R262 Difficulty in walking, not elsewhere classified: Secondary | ICD-10-CM | POA: Diagnosis not present

## 2022-02-27 DIAGNOSIS — M24562 Contracture, left knee: Secondary | ICD-10-CM | POA: Diagnosis not present

## 2022-02-27 DIAGNOSIS — R278 Other lack of coordination: Secondary | ICD-10-CM | POA: Diagnosis not present

## 2022-02-27 DIAGNOSIS — R2681 Unsteadiness on feet: Secondary | ICD-10-CM | POA: Diagnosis not present

## 2022-02-27 DIAGNOSIS — G8194 Hemiplegia, unspecified affecting left nondominant side: Secondary | ICD-10-CM | POA: Diagnosis not present

## 2022-02-27 DIAGNOSIS — M24561 Contracture, right knee: Secondary | ICD-10-CM | POA: Diagnosis not present

## 2022-02-27 DIAGNOSIS — D649 Anemia, unspecified: Secondary | ICD-10-CM | POA: Diagnosis not present

## 2022-02-27 DIAGNOSIS — R293 Abnormal posture: Secondary | ICD-10-CM | POA: Diagnosis not present

## 2022-02-27 DIAGNOSIS — M6281 Muscle weakness (generalized): Secondary | ICD-10-CM | POA: Diagnosis not present

## 2022-02-28 DIAGNOSIS — R293 Abnormal posture: Secondary | ICD-10-CM | POA: Diagnosis not present

## 2022-02-28 DIAGNOSIS — D649 Anemia, unspecified: Secondary | ICD-10-CM | POA: Diagnosis not present

## 2022-02-28 DIAGNOSIS — G8194 Hemiplegia, unspecified affecting left nondominant side: Secondary | ICD-10-CM | POA: Diagnosis not present

## 2022-02-28 DIAGNOSIS — R278 Other lack of coordination: Secondary | ICD-10-CM | POA: Diagnosis not present

## 2022-02-28 DIAGNOSIS — M24561 Contracture, right knee: Secondary | ICD-10-CM | POA: Diagnosis not present

## 2022-02-28 DIAGNOSIS — M24562 Contracture, left knee: Secondary | ICD-10-CM | POA: Diagnosis not present

## 2022-02-28 DIAGNOSIS — M6281 Muscle weakness (generalized): Secondary | ICD-10-CM | POA: Diagnosis not present

## 2022-02-28 DIAGNOSIS — R2681 Unsteadiness on feet: Secondary | ICD-10-CM | POA: Diagnosis not present

## 2022-02-28 DIAGNOSIS — R262 Difficulty in walking, not elsewhere classified: Secondary | ICD-10-CM | POA: Diagnosis not present

## 2022-03-01 DIAGNOSIS — G8194 Hemiplegia, unspecified affecting left nondominant side: Secondary | ICD-10-CM | POA: Diagnosis not present

## 2022-03-01 DIAGNOSIS — M24561 Contracture, right knee: Secondary | ICD-10-CM | POA: Diagnosis not present

## 2022-03-01 DIAGNOSIS — M24562 Contracture, left knee: Secondary | ICD-10-CM | POA: Diagnosis not present

## 2022-03-01 DIAGNOSIS — M6281 Muscle weakness (generalized): Secondary | ICD-10-CM | POA: Diagnosis not present

## 2022-03-01 DIAGNOSIS — R293 Abnormal posture: Secondary | ICD-10-CM | POA: Diagnosis not present

## 2022-03-01 DIAGNOSIS — R2681 Unsteadiness on feet: Secondary | ICD-10-CM | POA: Diagnosis not present

## 2022-03-01 DIAGNOSIS — D649 Anemia, unspecified: Secondary | ICD-10-CM | POA: Diagnosis not present

## 2022-03-01 DIAGNOSIS — R262 Difficulty in walking, not elsewhere classified: Secondary | ICD-10-CM | POA: Diagnosis not present

## 2022-03-01 DIAGNOSIS — R278 Other lack of coordination: Secondary | ICD-10-CM | POA: Diagnosis not present

## 2022-03-09 DIAGNOSIS — D649 Anemia, unspecified: Secondary | ICD-10-CM | POA: Diagnosis not present

## 2022-03-09 DIAGNOSIS — R278 Other lack of coordination: Secondary | ICD-10-CM | POA: Diagnosis not present

## 2022-03-09 DIAGNOSIS — M6281 Muscle weakness (generalized): Secondary | ICD-10-CM | POA: Diagnosis not present

## 2022-03-09 DIAGNOSIS — R2681 Unsteadiness on feet: Secondary | ICD-10-CM | POA: Diagnosis not present

## 2022-03-09 DIAGNOSIS — R293 Abnormal posture: Secondary | ICD-10-CM | POA: Diagnosis not present

## 2022-03-09 DIAGNOSIS — M24561 Contracture, right knee: Secondary | ICD-10-CM | POA: Diagnosis not present

## 2022-03-09 DIAGNOSIS — M24562 Contracture, left knee: Secondary | ICD-10-CM | POA: Diagnosis not present

## 2022-03-09 DIAGNOSIS — R262 Difficulty in walking, not elsewhere classified: Secondary | ICD-10-CM | POA: Diagnosis not present

## 2022-03-09 DIAGNOSIS — G8194 Hemiplegia, unspecified affecting left nondominant side: Secondary | ICD-10-CM | POA: Diagnosis not present

## 2022-03-10 DIAGNOSIS — R278 Other lack of coordination: Secondary | ICD-10-CM | POA: Diagnosis not present

## 2022-03-10 DIAGNOSIS — M6281 Muscle weakness (generalized): Secondary | ICD-10-CM | POA: Diagnosis not present

## 2022-03-10 DIAGNOSIS — G8194 Hemiplegia, unspecified affecting left nondominant side: Secondary | ICD-10-CM | POA: Diagnosis not present

## 2022-03-10 DIAGNOSIS — R2681 Unsteadiness on feet: Secondary | ICD-10-CM | POA: Diagnosis not present

## 2022-03-10 DIAGNOSIS — M24561 Contracture, right knee: Secondary | ICD-10-CM | POA: Diagnosis not present

## 2022-03-10 DIAGNOSIS — R262 Difficulty in walking, not elsewhere classified: Secondary | ICD-10-CM | POA: Diagnosis not present

## 2022-03-10 DIAGNOSIS — M24562 Contracture, left knee: Secondary | ICD-10-CM | POA: Diagnosis not present

## 2022-03-10 DIAGNOSIS — D649 Anemia, unspecified: Secondary | ICD-10-CM | POA: Diagnosis not present

## 2022-03-10 DIAGNOSIS — R293 Abnormal posture: Secondary | ICD-10-CM | POA: Diagnosis not present

## 2022-03-11 DIAGNOSIS — R2681 Unsteadiness on feet: Secondary | ICD-10-CM | POA: Diagnosis not present

## 2022-03-11 DIAGNOSIS — D649 Anemia, unspecified: Secondary | ICD-10-CM | POA: Diagnosis not present

## 2022-03-11 DIAGNOSIS — M24562 Contracture, left knee: Secondary | ICD-10-CM | POA: Diagnosis not present

## 2022-03-11 DIAGNOSIS — G8194 Hemiplegia, unspecified affecting left nondominant side: Secondary | ICD-10-CM | POA: Diagnosis not present

## 2022-03-11 DIAGNOSIS — R293 Abnormal posture: Secondary | ICD-10-CM | POA: Diagnosis not present

## 2022-03-11 DIAGNOSIS — M6281 Muscle weakness (generalized): Secondary | ICD-10-CM | POA: Diagnosis not present

## 2022-03-11 DIAGNOSIS — R278 Other lack of coordination: Secondary | ICD-10-CM | POA: Diagnosis not present

## 2022-03-11 DIAGNOSIS — R262 Difficulty in walking, not elsewhere classified: Secondary | ICD-10-CM | POA: Diagnosis not present

## 2022-03-11 DIAGNOSIS — M24561 Contracture, right knee: Secondary | ICD-10-CM | POA: Diagnosis not present

## 2022-03-14 DIAGNOSIS — D649 Anemia, unspecified: Secondary | ICD-10-CM | POA: Diagnosis not present

## 2022-03-14 DIAGNOSIS — M24562 Contracture, left knee: Secondary | ICD-10-CM | POA: Diagnosis not present

## 2022-03-14 DIAGNOSIS — R2681 Unsteadiness on feet: Secondary | ICD-10-CM | POA: Diagnosis not present

## 2022-03-14 DIAGNOSIS — R278 Other lack of coordination: Secondary | ICD-10-CM | POA: Diagnosis not present

## 2022-03-14 DIAGNOSIS — R293 Abnormal posture: Secondary | ICD-10-CM | POA: Diagnosis not present

## 2022-03-14 DIAGNOSIS — R262 Difficulty in walking, not elsewhere classified: Secondary | ICD-10-CM | POA: Diagnosis not present

## 2022-03-14 DIAGNOSIS — M24561 Contracture, right knee: Secondary | ICD-10-CM | POA: Diagnosis not present

## 2022-03-14 DIAGNOSIS — M6281 Muscle weakness (generalized): Secondary | ICD-10-CM | POA: Diagnosis not present

## 2022-03-14 DIAGNOSIS — G8194 Hemiplegia, unspecified affecting left nondominant side: Secondary | ICD-10-CM | POA: Diagnosis not present

## 2022-03-15 DIAGNOSIS — R2681 Unsteadiness on feet: Secondary | ICD-10-CM | POA: Diagnosis not present

## 2022-03-15 DIAGNOSIS — G8194 Hemiplegia, unspecified affecting left nondominant side: Secondary | ICD-10-CM | POA: Diagnosis not present

## 2022-03-15 DIAGNOSIS — M6281 Muscle weakness (generalized): Secondary | ICD-10-CM | POA: Diagnosis not present

## 2022-03-15 DIAGNOSIS — R278 Other lack of coordination: Secondary | ICD-10-CM | POA: Diagnosis not present

## 2022-03-15 DIAGNOSIS — R262 Difficulty in walking, not elsewhere classified: Secondary | ICD-10-CM | POA: Diagnosis not present

## 2022-03-15 DIAGNOSIS — R293 Abnormal posture: Secondary | ICD-10-CM | POA: Diagnosis not present

## 2022-03-15 DIAGNOSIS — D649 Anemia, unspecified: Secondary | ICD-10-CM | POA: Diagnosis not present

## 2022-03-15 DIAGNOSIS — M24562 Contracture, left knee: Secondary | ICD-10-CM | POA: Diagnosis not present

## 2022-03-15 DIAGNOSIS — M24561 Contracture, right knee: Secondary | ICD-10-CM | POA: Diagnosis not present

## 2022-03-16 DIAGNOSIS — D649 Anemia, unspecified: Secondary | ICD-10-CM | POA: Diagnosis not present

## 2022-03-16 DIAGNOSIS — R2681 Unsteadiness on feet: Secondary | ICD-10-CM | POA: Diagnosis not present

## 2022-03-16 DIAGNOSIS — M6281 Muscle weakness (generalized): Secondary | ICD-10-CM | POA: Diagnosis not present

## 2022-03-16 DIAGNOSIS — R278 Other lack of coordination: Secondary | ICD-10-CM | POA: Diagnosis not present

## 2022-03-16 DIAGNOSIS — R293 Abnormal posture: Secondary | ICD-10-CM | POA: Diagnosis not present

## 2022-03-16 DIAGNOSIS — M24562 Contracture, left knee: Secondary | ICD-10-CM | POA: Diagnosis not present

## 2022-03-16 DIAGNOSIS — G8194 Hemiplegia, unspecified affecting left nondominant side: Secondary | ICD-10-CM | POA: Diagnosis not present

## 2022-03-16 DIAGNOSIS — M24561 Contracture, right knee: Secondary | ICD-10-CM | POA: Diagnosis not present

## 2022-03-16 DIAGNOSIS — R262 Difficulty in walking, not elsewhere classified: Secondary | ICD-10-CM | POA: Diagnosis not present

## 2022-03-17 DIAGNOSIS — M6281 Muscle weakness (generalized): Secondary | ICD-10-CM | POA: Diagnosis not present

## 2022-03-17 DIAGNOSIS — M24561 Contracture, right knee: Secondary | ICD-10-CM | POA: Diagnosis not present

## 2022-03-17 DIAGNOSIS — R278 Other lack of coordination: Secondary | ICD-10-CM | POA: Diagnosis not present

## 2022-03-17 DIAGNOSIS — R293 Abnormal posture: Secondary | ICD-10-CM | POA: Diagnosis not present

## 2022-03-17 DIAGNOSIS — M24562 Contracture, left knee: Secondary | ICD-10-CM | POA: Diagnosis not present

## 2022-03-17 DIAGNOSIS — R262 Difficulty in walking, not elsewhere classified: Secondary | ICD-10-CM | POA: Diagnosis not present

## 2022-03-17 DIAGNOSIS — R2681 Unsteadiness on feet: Secondary | ICD-10-CM | POA: Diagnosis not present

## 2022-03-17 DIAGNOSIS — G8194 Hemiplegia, unspecified affecting left nondominant side: Secondary | ICD-10-CM | POA: Diagnosis not present

## 2022-03-17 DIAGNOSIS — D649 Anemia, unspecified: Secondary | ICD-10-CM | POA: Diagnosis not present

## 2022-03-20 DIAGNOSIS — R293 Abnormal posture: Secondary | ICD-10-CM | POA: Diagnosis not present

## 2022-03-20 DIAGNOSIS — M24562 Contracture, left knee: Secondary | ICD-10-CM | POA: Diagnosis not present

## 2022-03-20 DIAGNOSIS — M6281 Muscle weakness (generalized): Secondary | ICD-10-CM | POA: Diagnosis not present

## 2022-03-20 DIAGNOSIS — G8194 Hemiplegia, unspecified affecting left nondominant side: Secondary | ICD-10-CM | POA: Diagnosis not present

## 2022-03-20 DIAGNOSIS — D649 Anemia, unspecified: Secondary | ICD-10-CM | POA: Diagnosis not present

## 2022-03-20 DIAGNOSIS — R278 Other lack of coordination: Secondary | ICD-10-CM | POA: Diagnosis not present

## 2022-03-20 DIAGNOSIS — R2681 Unsteadiness on feet: Secondary | ICD-10-CM | POA: Diagnosis not present

## 2022-03-20 DIAGNOSIS — R262 Difficulty in walking, not elsewhere classified: Secondary | ICD-10-CM | POA: Diagnosis not present

## 2022-03-20 DIAGNOSIS — M24561 Contracture, right knee: Secondary | ICD-10-CM | POA: Diagnosis not present

## 2022-03-21 ENCOUNTER — Ambulatory Visit: Payer: Medicare PPO | Admitting: Physician Assistant

## 2022-03-21 DIAGNOSIS — M24561 Contracture, right knee: Secondary | ICD-10-CM | POA: Diagnosis not present

## 2022-03-21 DIAGNOSIS — R278 Other lack of coordination: Secondary | ICD-10-CM | POA: Diagnosis not present

## 2022-03-21 DIAGNOSIS — R262 Difficulty in walking, not elsewhere classified: Secondary | ICD-10-CM | POA: Diagnosis not present

## 2022-03-21 DIAGNOSIS — E785 Hyperlipidemia, unspecified: Secondary | ICD-10-CM | POA: Diagnosis not present

## 2022-03-21 DIAGNOSIS — F0392 Unspecified dementia, unspecified severity, with psychotic disturbance: Secondary | ICD-10-CM | POA: Diagnosis not present

## 2022-03-21 DIAGNOSIS — R293 Abnormal posture: Secondary | ICD-10-CM | POA: Diagnosis not present

## 2022-03-21 DIAGNOSIS — I69354 Hemiplegia and hemiparesis following cerebral infarction affecting left non-dominant side: Secondary | ICD-10-CM | POA: Diagnosis not present

## 2022-03-21 DIAGNOSIS — G8194 Hemiplegia, unspecified affecting left nondominant side: Secondary | ICD-10-CM | POA: Diagnosis not present

## 2022-03-21 DIAGNOSIS — I10 Essential (primary) hypertension: Secondary | ICD-10-CM | POA: Diagnosis not present

## 2022-03-21 DIAGNOSIS — F259 Schizoaffective disorder, unspecified: Secondary | ICD-10-CM | POA: Diagnosis not present

## 2022-03-21 DIAGNOSIS — D649 Anemia, unspecified: Secondary | ICD-10-CM | POA: Diagnosis not present

## 2022-03-21 DIAGNOSIS — H1045 Other chronic allergic conjunctivitis: Secondary | ICD-10-CM | POA: Diagnosis not present

## 2022-03-21 DIAGNOSIS — M6281 Muscle weakness (generalized): Secondary | ICD-10-CM | POA: Diagnosis not present

## 2022-03-21 DIAGNOSIS — R2681 Unsteadiness on feet: Secondary | ICD-10-CM | POA: Diagnosis not present

## 2022-03-21 DIAGNOSIS — M24562 Contracture, left knee: Secondary | ICD-10-CM | POA: Diagnosis not present

## 2022-03-22 DIAGNOSIS — G8194 Hemiplegia, unspecified affecting left nondominant side: Secondary | ICD-10-CM | POA: Diagnosis not present

## 2022-03-22 DIAGNOSIS — D649 Anemia, unspecified: Secondary | ICD-10-CM | POA: Diagnosis not present

## 2022-03-22 DIAGNOSIS — M24561 Contracture, right knee: Secondary | ICD-10-CM | POA: Diagnosis not present

## 2022-03-22 DIAGNOSIS — R293 Abnormal posture: Secondary | ICD-10-CM | POA: Diagnosis not present

## 2022-03-22 DIAGNOSIS — R278 Other lack of coordination: Secondary | ICD-10-CM | POA: Diagnosis not present

## 2022-03-22 DIAGNOSIS — R262 Difficulty in walking, not elsewhere classified: Secondary | ICD-10-CM | POA: Diagnosis not present

## 2022-03-22 DIAGNOSIS — M6281 Muscle weakness (generalized): Secondary | ICD-10-CM | POA: Diagnosis not present

## 2022-03-22 DIAGNOSIS — R2681 Unsteadiness on feet: Secondary | ICD-10-CM | POA: Diagnosis not present

## 2022-03-22 DIAGNOSIS — M24562 Contracture, left knee: Secondary | ICD-10-CM | POA: Diagnosis not present

## 2022-03-23 DIAGNOSIS — M24561 Contracture, right knee: Secondary | ICD-10-CM | POA: Diagnosis not present

## 2022-03-23 DIAGNOSIS — D649 Anemia, unspecified: Secondary | ICD-10-CM | POA: Diagnosis not present

## 2022-03-23 DIAGNOSIS — R262 Difficulty in walking, not elsewhere classified: Secondary | ICD-10-CM | POA: Diagnosis not present

## 2022-03-23 DIAGNOSIS — G8194 Hemiplegia, unspecified affecting left nondominant side: Secondary | ICD-10-CM | POA: Diagnosis not present

## 2022-03-23 DIAGNOSIS — R2681 Unsteadiness on feet: Secondary | ICD-10-CM | POA: Diagnosis not present

## 2022-03-23 DIAGNOSIS — R293 Abnormal posture: Secondary | ICD-10-CM | POA: Diagnosis not present

## 2022-03-23 DIAGNOSIS — M24562 Contracture, left knee: Secondary | ICD-10-CM | POA: Diagnosis not present

## 2022-03-23 DIAGNOSIS — R278 Other lack of coordination: Secondary | ICD-10-CM | POA: Diagnosis not present

## 2022-03-23 DIAGNOSIS — M6281 Muscle weakness (generalized): Secondary | ICD-10-CM | POA: Diagnosis not present

## 2022-03-24 DIAGNOSIS — M24561 Contracture, right knee: Secondary | ICD-10-CM | POA: Diagnosis not present

## 2022-03-24 DIAGNOSIS — M6281 Muscle weakness (generalized): Secondary | ICD-10-CM | POA: Diagnosis not present

## 2022-03-24 DIAGNOSIS — G8194 Hemiplegia, unspecified affecting left nondominant side: Secondary | ICD-10-CM | POA: Diagnosis not present

## 2022-03-24 DIAGNOSIS — R262 Difficulty in walking, not elsewhere classified: Secondary | ICD-10-CM | POA: Diagnosis not present

## 2022-03-24 DIAGNOSIS — M24562 Contracture, left knee: Secondary | ICD-10-CM | POA: Diagnosis not present

## 2022-03-24 DIAGNOSIS — R293 Abnormal posture: Secondary | ICD-10-CM | POA: Diagnosis not present

## 2022-03-24 DIAGNOSIS — R2681 Unsteadiness on feet: Secondary | ICD-10-CM | POA: Diagnosis not present

## 2022-03-24 DIAGNOSIS — R278 Other lack of coordination: Secondary | ICD-10-CM | POA: Diagnosis not present

## 2022-03-24 DIAGNOSIS — D649 Anemia, unspecified: Secondary | ICD-10-CM | POA: Diagnosis not present

## 2022-03-27 DIAGNOSIS — F3130 Bipolar disorder, current episode depressed, mild or moderate severity, unspecified: Secondary | ICD-10-CM | POA: Diagnosis not present

## 2022-03-27 DIAGNOSIS — R293 Abnormal posture: Secondary | ICD-10-CM | POA: Diagnosis not present

## 2022-03-27 DIAGNOSIS — F418 Other specified anxiety disorders: Secondary | ICD-10-CM | POA: Diagnosis not present

## 2022-03-27 DIAGNOSIS — G8194 Hemiplegia, unspecified affecting left nondominant side: Secondary | ICD-10-CM | POA: Diagnosis not present

## 2022-03-27 DIAGNOSIS — R262 Difficulty in walking, not elsewhere classified: Secondary | ICD-10-CM | POA: Diagnosis not present

## 2022-03-27 DIAGNOSIS — M6281 Muscle weakness (generalized): Secondary | ICD-10-CM | POA: Diagnosis not present

## 2022-03-27 DIAGNOSIS — R278 Other lack of coordination: Secondary | ICD-10-CM | POA: Diagnosis not present

## 2022-03-27 DIAGNOSIS — F200 Paranoid schizophrenia: Secondary | ICD-10-CM | POA: Diagnosis not present

## 2022-03-27 DIAGNOSIS — D649 Anemia, unspecified: Secondary | ICD-10-CM | POA: Diagnosis not present

## 2022-03-27 DIAGNOSIS — M24561 Contracture, right knee: Secondary | ICD-10-CM | POA: Diagnosis not present

## 2022-03-27 DIAGNOSIS — R2681 Unsteadiness on feet: Secondary | ICD-10-CM | POA: Diagnosis not present

## 2022-03-27 DIAGNOSIS — M24562 Contracture, left knee: Secondary | ICD-10-CM | POA: Diagnosis not present

## 2022-03-28 DIAGNOSIS — R262 Difficulty in walking, not elsewhere classified: Secondary | ICD-10-CM | POA: Diagnosis not present

## 2022-03-28 DIAGNOSIS — M24562 Contracture, left knee: Secondary | ICD-10-CM | POA: Diagnosis not present

## 2022-03-28 DIAGNOSIS — R278 Other lack of coordination: Secondary | ICD-10-CM | POA: Diagnosis not present

## 2022-03-28 DIAGNOSIS — G8194 Hemiplegia, unspecified affecting left nondominant side: Secondary | ICD-10-CM | POA: Diagnosis not present

## 2022-03-28 DIAGNOSIS — D649 Anemia, unspecified: Secondary | ICD-10-CM | POA: Diagnosis not present

## 2022-03-28 DIAGNOSIS — R2681 Unsteadiness on feet: Secondary | ICD-10-CM | POA: Diagnosis not present

## 2022-03-28 DIAGNOSIS — M24561 Contracture, right knee: Secondary | ICD-10-CM | POA: Diagnosis not present

## 2022-03-28 DIAGNOSIS — M6281 Muscle weakness (generalized): Secondary | ICD-10-CM | POA: Diagnosis not present

## 2022-03-28 DIAGNOSIS — R293 Abnormal posture: Secondary | ICD-10-CM | POA: Diagnosis not present

## 2022-03-29 DIAGNOSIS — R2681 Unsteadiness on feet: Secondary | ICD-10-CM | POA: Diagnosis not present

## 2022-03-29 DIAGNOSIS — M6281 Muscle weakness (generalized): Secondary | ICD-10-CM | POA: Diagnosis not present

## 2022-03-29 DIAGNOSIS — K644 Residual hemorrhoidal skin tags: Secondary | ICD-10-CM | POA: Diagnosis not present

## 2022-03-29 DIAGNOSIS — M24561 Contracture, right knee: Secondary | ICD-10-CM | POA: Diagnosis not present

## 2022-03-29 DIAGNOSIS — R278 Other lack of coordination: Secondary | ICD-10-CM | POA: Diagnosis not present

## 2022-03-29 DIAGNOSIS — D649 Anemia, unspecified: Secondary | ICD-10-CM | POA: Diagnosis not present

## 2022-03-29 DIAGNOSIS — I10 Essential (primary) hypertension: Secondary | ICD-10-CM | POA: Diagnosis not present

## 2022-03-29 DIAGNOSIS — R293 Abnormal posture: Secondary | ICD-10-CM | POA: Diagnosis not present

## 2022-03-29 DIAGNOSIS — M24562 Contracture, left knee: Secondary | ICD-10-CM | POA: Diagnosis not present

## 2022-03-29 DIAGNOSIS — R262 Difficulty in walking, not elsewhere classified: Secondary | ICD-10-CM | POA: Diagnosis not present

## 2022-03-29 DIAGNOSIS — K259 Gastric ulcer, unspecified as acute or chronic, without hemorrhage or perforation: Secondary | ICD-10-CM | POA: Diagnosis not present

## 2022-03-29 DIAGNOSIS — G4734 Idiopathic sleep related nonobstructive alveolar hypoventilation: Secondary | ICD-10-CM | POA: Diagnosis not present

## 2022-03-29 DIAGNOSIS — G8194 Hemiplegia, unspecified affecting left nondominant side: Secondary | ICD-10-CM | POA: Diagnosis not present

## 2022-03-30 DIAGNOSIS — R262 Difficulty in walking, not elsewhere classified: Secondary | ICD-10-CM | POA: Diagnosis not present

## 2022-03-30 DIAGNOSIS — R2681 Unsteadiness on feet: Secondary | ICD-10-CM | POA: Diagnosis not present

## 2022-03-30 DIAGNOSIS — M24562 Contracture, left knee: Secondary | ICD-10-CM | POA: Diagnosis not present

## 2022-03-30 DIAGNOSIS — R293 Abnormal posture: Secondary | ICD-10-CM | POA: Diagnosis not present

## 2022-03-30 DIAGNOSIS — D649 Anemia, unspecified: Secondary | ICD-10-CM | POA: Diagnosis not present

## 2022-03-30 DIAGNOSIS — G8194 Hemiplegia, unspecified affecting left nondominant side: Secondary | ICD-10-CM | POA: Diagnosis not present

## 2022-03-30 DIAGNOSIS — M6281 Muscle weakness (generalized): Secondary | ICD-10-CM | POA: Diagnosis not present

## 2022-03-30 DIAGNOSIS — R278 Other lack of coordination: Secondary | ICD-10-CM | POA: Diagnosis not present

## 2022-03-30 DIAGNOSIS — M24561 Contracture, right knee: Secondary | ICD-10-CM | POA: Diagnosis not present

## 2022-03-31 DIAGNOSIS — R2681 Unsteadiness on feet: Secondary | ICD-10-CM | POA: Diagnosis not present

## 2022-03-31 DIAGNOSIS — R262 Difficulty in walking, not elsewhere classified: Secondary | ICD-10-CM | POA: Diagnosis not present

## 2022-03-31 DIAGNOSIS — M24561 Contracture, right knee: Secondary | ICD-10-CM | POA: Diagnosis not present

## 2022-03-31 DIAGNOSIS — M24562 Contracture, left knee: Secondary | ICD-10-CM | POA: Diagnosis not present

## 2022-03-31 DIAGNOSIS — R293 Abnormal posture: Secondary | ICD-10-CM | POA: Diagnosis not present

## 2022-03-31 DIAGNOSIS — G8194 Hemiplegia, unspecified affecting left nondominant side: Secondary | ICD-10-CM | POA: Diagnosis not present

## 2022-03-31 DIAGNOSIS — R278 Other lack of coordination: Secondary | ICD-10-CM | POA: Diagnosis not present

## 2022-03-31 DIAGNOSIS — M6281 Muscle weakness (generalized): Secondary | ICD-10-CM | POA: Diagnosis not present

## 2022-03-31 DIAGNOSIS — D649 Anemia, unspecified: Secondary | ICD-10-CM | POA: Diagnosis not present

## 2022-04-03 DIAGNOSIS — R278 Other lack of coordination: Secondary | ICD-10-CM | POA: Diagnosis not present

## 2022-04-03 DIAGNOSIS — M6281 Muscle weakness (generalized): Secondary | ICD-10-CM | POA: Diagnosis not present

## 2022-04-03 DIAGNOSIS — R262 Difficulty in walking, not elsewhere classified: Secondary | ICD-10-CM | POA: Diagnosis not present

## 2022-04-03 DIAGNOSIS — G8194 Hemiplegia, unspecified affecting left nondominant side: Secondary | ICD-10-CM | POA: Diagnosis not present

## 2022-04-03 DIAGNOSIS — M24561 Contracture, right knee: Secondary | ICD-10-CM | POA: Diagnosis not present

## 2022-04-03 DIAGNOSIS — D649 Anemia, unspecified: Secondary | ICD-10-CM | POA: Diagnosis not present

## 2022-04-03 DIAGNOSIS — M24562 Contracture, left knee: Secondary | ICD-10-CM | POA: Diagnosis not present

## 2022-04-03 DIAGNOSIS — R2681 Unsteadiness on feet: Secondary | ICD-10-CM | POA: Diagnosis not present

## 2022-04-03 DIAGNOSIS — R293 Abnormal posture: Secondary | ICD-10-CM | POA: Diagnosis not present

## 2022-04-04 DIAGNOSIS — R278 Other lack of coordination: Secondary | ICD-10-CM | POA: Diagnosis not present

## 2022-04-04 DIAGNOSIS — M24561 Contracture, right knee: Secondary | ICD-10-CM | POA: Diagnosis not present

## 2022-04-04 DIAGNOSIS — G8194 Hemiplegia, unspecified affecting left nondominant side: Secondary | ICD-10-CM | POA: Diagnosis not present

## 2022-04-04 DIAGNOSIS — R262 Difficulty in walking, not elsewhere classified: Secondary | ICD-10-CM | POA: Diagnosis not present

## 2022-04-04 DIAGNOSIS — R293 Abnormal posture: Secondary | ICD-10-CM | POA: Diagnosis not present

## 2022-04-04 DIAGNOSIS — R2681 Unsteadiness on feet: Secondary | ICD-10-CM | POA: Diagnosis not present

## 2022-04-04 DIAGNOSIS — M6281 Muscle weakness (generalized): Secondary | ICD-10-CM | POA: Diagnosis not present

## 2022-04-04 DIAGNOSIS — D649 Anemia, unspecified: Secondary | ICD-10-CM | POA: Diagnosis not present

## 2022-04-04 DIAGNOSIS — M24562 Contracture, left knee: Secondary | ICD-10-CM | POA: Diagnosis not present

## 2022-04-05 DIAGNOSIS — R262 Difficulty in walking, not elsewhere classified: Secondary | ICD-10-CM | POA: Diagnosis not present

## 2022-04-05 DIAGNOSIS — D649 Anemia, unspecified: Secondary | ICD-10-CM | POA: Diagnosis not present

## 2022-04-05 DIAGNOSIS — R278 Other lack of coordination: Secondary | ICD-10-CM | POA: Diagnosis not present

## 2022-04-05 DIAGNOSIS — R293 Abnormal posture: Secondary | ICD-10-CM | POA: Diagnosis not present

## 2022-04-05 DIAGNOSIS — M24561 Contracture, right knee: Secondary | ICD-10-CM | POA: Diagnosis not present

## 2022-04-05 DIAGNOSIS — R2681 Unsteadiness on feet: Secondary | ICD-10-CM | POA: Diagnosis not present

## 2022-04-05 DIAGNOSIS — G8194 Hemiplegia, unspecified affecting left nondominant side: Secondary | ICD-10-CM | POA: Diagnosis not present

## 2022-04-05 DIAGNOSIS — M24562 Contracture, left knee: Secondary | ICD-10-CM | POA: Diagnosis not present

## 2022-04-05 DIAGNOSIS — M6281 Muscle weakness (generalized): Secondary | ICD-10-CM | POA: Diagnosis not present

## 2022-04-06 DIAGNOSIS — M24562 Contracture, left knee: Secondary | ICD-10-CM | POA: Diagnosis not present

## 2022-04-06 DIAGNOSIS — R278 Other lack of coordination: Secondary | ICD-10-CM | POA: Diagnosis not present

## 2022-04-06 DIAGNOSIS — R293 Abnormal posture: Secondary | ICD-10-CM | POA: Diagnosis not present

## 2022-04-06 DIAGNOSIS — R2681 Unsteadiness on feet: Secondary | ICD-10-CM | POA: Diagnosis not present

## 2022-04-06 DIAGNOSIS — G8194 Hemiplegia, unspecified affecting left nondominant side: Secondary | ICD-10-CM | POA: Diagnosis not present

## 2022-04-06 DIAGNOSIS — D649 Anemia, unspecified: Secondary | ICD-10-CM | POA: Diagnosis not present

## 2022-04-06 DIAGNOSIS — M6281 Muscle weakness (generalized): Secondary | ICD-10-CM | POA: Diagnosis not present

## 2022-04-06 DIAGNOSIS — R262 Difficulty in walking, not elsewhere classified: Secondary | ICD-10-CM | POA: Diagnosis not present

## 2022-04-06 DIAGNOSIS — M24561 Contracture, right knee: Secondary | ICD-10-CM | POA: Diagnosis not present

## 2022-04-07 DIAGNOSIS — M24561 Contracture, right knee: Secondary | ICD-10-CM | POA: Diagnosis not present

## 2022-04-07 DIAGNOSIS — G8194 Hemiplegia, unspecified affecting left nondominant side: Secondary | ICD-10-CM | POA: Diagnosis not present

## 2022-04-07 DIAGNOSIS — R2681 Unsteadiness on feet: Secondary | ICD-10-CM | POA: Diagnosis not present

## 2022-04-07 DIAGNOSIS — R293 Abnormal posture: Secondary | ICD-10-CM | POA: Diagnosis not present

## 2022-04-07 DIAGNOSIS — R262 Difficulty in walking, not elsewhere classified: Secondary | ICD-10-CM | POA: Diagnosis not present

## 2022-04-07 DIAGNOSIS — R278 Other lack of coordination: Secondary | ICD-10-CM | POA: Diagnosis not present

## 2022-04-07 DIAGNOSIS — D649 Anemia, unspecified: Secondary | ICD-10-CM | POA: Diagnosis not present

## 2022-04-07 DIAGNOSIS — M6281 Muscle weakness (generalized): Secondary | ICD-10-CM | POA: Diagnosis not present

## 2022-04-07 DIAGNOSIS — M24562 Contracture, left knee: Secondary | ICD-10-CM | POA: Diagnosis not present

## 2022-04-09 DIAGNOSIS — R262 Difficulty in walking, not elsewhere classified: Secondary | ICD-10-CM | POA: Diagnosis not present

## 2022-04-09 DIAGNOSIS — R2681 Unsteadiness on feet: Secondary | ICD-10-CM | POA: Diagnosis not present

## 2022-04-09 DIAGNOSIS — R278 Other lack of coordination: Secondary | ICD-10-CM | POA: Diagnosis not present

## 2022-04-09 DIAGNOSIS — G8194 Hemiplegia, unspecified affecting left nondominant side: Secondary | ICD-10-CM | POA: Diagnosis not present

## 2022-04-09 DIAGNOSIS — M6281 Muscle weakness (generalized): Secondary | ICD-10-CM | POA: Diagnosis not present

## 2022-04-09 DIAGNOSIS — D649 Anemia, unspecified: Secondary | ICD-10-CM | POA: Diagnosis not present

## 2022-04-09 DIAGNOSIS — M24562 Contracture, left knee: Secondary | ICD-10-CM | POA: Diagnosis not present

## 2022-04-09 DIAGNOSIS — M24561 Contracture, right knee: Secondary | ICD-10-CM | POA: Diagnosis not present

## 2022-04-09 DIAGNOSIS — R293 Abnormal posture: Secondary | ICD-10-CM | POA: Diagnosis not present

## 2022-04-11 DIAGNOSIS — M6281 Muscle weakness (generalized): Secondary | ICD-10-CM | POA: Diagnosis not present

## 2022-04-11 DIAGNOSIS — M24562 Contracture, left knee: Secondary | ICD-10-CM | POA: Diagnosis not present

## 2022-04-11 DIAGNOSIS — R293 Abnormal posture: Secondary | ICD-10-CM | POA: Diagnosis not present

## 2022-04-11 DIAGNOSIS — R2681 Unsteadiness on feet: Secondary | ICD-10-CM | POA: Diagnosis not present

## 2022-04-11 DIAGNOSIS — M24561 Contracture, right knee: Secondary | ICD-10-CM | POA: Diagnosis not present

## 2022-04-11 DIAGNOSIS — R262 Difficulty in walking, not elsewhere classified: Secondary | ICD-10-CM | POA: Diagnosis not present

## 2022-04-11 DIAGNOSIS — R278 Other lack of coordination: Secondary | ICD-10-CM | POA: Diagnosis not present

## 2022-04-11 DIAGNOSIS — G8194 Hemiplegia, unspecified affecting left nondominant side: Secondary | ICD-10-CM | POA: Diagnosis not present

## 2022-04-11 DIAGNOSIS — D649 Anemia, unspecified: Secondary | ICD-10-CM | POA: Diagnosis not present

## 2022-04-12 DIAGNOSIS — M24561 Contracture, right knee: Secondary | ICD-10-CM | POA: Diagnosis not present

## 2022-04-12 DIAGNOSIS — M6281 Muscle weakness (generalized): Secondary | ICD-10-CM | POA: Diagnosis not present

## 2022-04-12 DIAGNOSIS — R2681 Unsteadiness on feet: Secondary | ICD-10-CM | POA: Diagnosis not present

## 2022-04-12 DIAGNOSIS — G8194 Hemiplegia, unspecified affecting left nondominant side: Secondary | ICD-10-CM | POA: Diagnosis not present

## 2022-04-12 DIAGNOSIS — R278 Other lack of coordination: Secondary | ICD-10-CM | POA: Diagnosis not present

## 2022-04-12 DIAGNOSIS — D649 Anemia, unspecified: Secondary | ICD-10-CM | POA: Diagnosis not present

## 2022-04-12 DIAGNOSIS — R262 Difficulty in walking, not elsewhere classified: Secondary | ICD-10-CM | POA: Diagnosis not present

## 2022-04-12 DIAGNOSIS — R293 Abnormal posture: Secondary | ICD-10-CM | POA: Diagnosis not present

## 2022-04-12 DIAGNOSIS — M24562 Contracture, left knee: Secondary | ICD-10-CM | POA: Diagnosis not present

## 2022-04-13 DIAGNOSIS — F259 Schizoaffective disorder, unspecified: Secondary | ICD-10-CM | POA: Diagnosis not present

## 2022-04-13 DIAGNOSIS — M24561 Contracture, right knee: Secondary | ICD-10-CM | POA: Diagnosis not present

## 2022-04-13 DIAGNOSIS — E785 Hyperlipidemia, unspecified: Secondary | ICD-10-CM | POA: Diagnosis not present

## 2022-04-13 DIAGNOSIS — G8194 Hemiplegia, unspecified affecting left nondominant side: Secondary | ICD-10-CM | POA: Diagnosis not present

## 2022-04-13 DIAGNOSIS — M24562 Contracture, left knee: Secondary | ICD-10-CM | POA: Diagnosis not present

## 2022-04-13 DIAGNOSIS — M6281 Muscle weakness (generalized): Secondary | ICD-10-CM | POA: Diagnosis not present

## 2022-04-14 DIAGNOSIS — M24461 Recurrent dislocation, right knee: Secondary | ICD-10-CM | POA: Diagnosis not present

## 2022-04-14 DIAGNOSIS — F0393 Unspecified dementia, unspecified severity, with mood disturbance: Secondary | ICD-10-CM | POA: Diagnosis not present

## 2022-04-14 DIAGNOSIS — F259 Schizoaffective disorder, unspecified: Secondary | ICD-10-CM | POA: Diagnosis not present

## 2022-04-14 DIAGNOSIS — K5790 Diverticulosis of intestine, part unspecified, without perforation or abscess without bleeding: Secondary | ICD-10-CM | POA: Diagnosis not present

## 2022-04-14 DIAGNOSIS — Z23 Encounter for immunization: Secondary | ICD-10-CM | POA: Diagnosis not present

## 2022-04-14 DIAGNOSIS — Z9181 History of falling: Secondary | ICD-10-CM | POA: Diagnosis not present

## 2022-04-14 DIAGNOSIS — I69359 Hemiplegia and hemiparesis following cerebral infarction affecting unspecified side: Secondary | ICD-10-CM | POA: Diagnosis not present

## 2022-04-14 DIAGNOSIS — K279 Peptic ulcer, site unspecified, unspecified as acute or chronic, without hemorrhage or perforation: Secondary | ICD-10-CM | POA: Diagnosis not present

## 2022-04-14 DIAGNOSIS — I679 Cerebrovascular disease, unspecified: Secondary | ICD-10-CM | POA: Diagnosis not present

## 2022-04-17 DIAGNOSIS — M24561 Contracture, right knee: Secondary | ICD-10-CM | POA: Diagnosis not present

## 2022-04-17 DIAGNOSIS — F259 Schizoaffective disorder, unspecified: Secondary | ICD-10-CM | POA: Diagnosis not present

## 2022-04-17 DIAGNOSIS — E785 Hyperlipidemia, unspecified: Secondary | ICD-10-CM | POA: Diagnosis not present

## 2022-04-17 DIAGNOSIS — M24562 Contracture, left knee: Secondary | ICD-10-CM | POA: Diagnosis not present

## 2022-04-17 DIAGNOSIS — G8194 Hemiplegia, unspecified affecting left nondominant side: Secondary | ICD-10-CM | POA: Diagnosis not present

## 2022-04-17 DIAGNOSIS — M6281 Muscle weakness (generalized): Secondary | ICD-10-CM | POA: Diagnosis not present

## 2022-04-18 DIAGNOSIS — M24562 Contracture, left knee: Secondary | ICD-10-CM | POA: Diagnosis not present

## 2022-04-18 DIAGNOSIS — F259 Schizoaffective disorder, unspecified: Secondary | ICD-10-CM | POA: Diagnosis not present

## 2022-04-18 DIAGNOSIS — G8194 Hemiplegia, unspecified affecting left nondominant side: Secondary | ICD-10-CM | POA: Diagnosis not present

## 2022-04-18 DIAGNOSIS — E785 Hyperlipidemia, unspecified: Secondary | ICD-10-CM | POA: Diagnosis not present

## 2022-04-18 DIAGNOSIS — M24561 Contracture, right knee: Secondary | ICD-10-CM | POA: Diagnosis not present

## 2022-04-18 DIAGNOSIS — M6281 Muscle weakness (generalized): Secondary | ICD-10-CM | POA: Diagnosis not present

## 2022-04-19 DIAGNOSIS — M24561 Contracture, right knee: Secondary | ICD-10-CM | POA: Diagnosis not present

## 2022-04-19 DIAGNOSIS — M24562 Contracture, left knee: Secondary | ICD-10-CM | POA: Diagnosis not present

## 2022-04-19 DIAGNOSIS — E785 Hyperlipidemia, unspecified: Secondary | ICD-10-CM | POA: Diagnosis not present

## 2022-04-19 DIAGNOSIS — M6281 Muscle weakness (generalized): Secondary | ICD-10-CM | POA: Diagnosis not present

## 2022-04-19 DIAGNOSIS — G8194 Hemiplegia, unspecified affecting left nondominant side: Secondary | ICD-10-CM | POA: Diagnosis not present

## 2022-04-19 DIAGNOSIS — F259 Schizoaffective disorder, unspecified: Secondary | ICD-10-CM | POA: Diagnosis not present

## 2022-04-20 DIAGNOSIS — M24561 Contracture, right knee: Secondary | ICD-10-CM | POA: Diagnosis not present

## 2022-04-20 DIAGNOSIS — M6281 Muscle weakness (generalized): Secondary | ICD-10-CM | POA: Diagnosis not present

## 2022-04-20 DIAGNOSIS — E785 Hyperlipidemia, unspecified: Secondary | ICD-10-CM | POA: Diagnosis not present

## 2022-04-20 DIAGNOSIS — M24562 Contracture, left knee: Secondary | ICD-10-CM | POA: Diagnosis not present

## 2022-04-20 DIAGNOSIS — G8194 Hemiplegia, unspecified affecting left nondominant side: Secondary | ICD-10-CM | POA: Diagnosis not present

## 2022-04-20 DIAGNOSIS — F259 Schizoaffective disorder, unspecified: Secondary | ICD-10-CM | POA: Diagnosis not present

## 2022-04-22 DIAGNOSIS — M24562 Contracture, left knee: Secondary | ICD-10-CM | POA: Diagnosis not present

## 2022-04-22 DIAGNOSIS — E785 Hyperlipidemia, unspecified: Secondary | ICD-10-CM | POA: Diagnosis not present

## 2022-04-22 DIAGNOSIS — M24561 Contracture, right knee: Secondary | ICD-10-CM | POA: Diagnosis not present

## 2022-04-22 DIAGNOSIS — M6281 Muscle weakness (generalized): Secondary | ICD-10-CM | POA: Diagnosis not present

## 2022-04-22 DIAGNOSIS — G8194 Hemiplegia, unspecified affecting left nondominant side: Secondary | ICD-10-CM | POA: Diagnosis not present

## 2022-04-22 DIAGNOSIS — F259 Schizoaffective disorder, unspecified: Secondary | ICD-10-CM | POA: Diagnosis not present

## 2022-04-25 DIAGNOSIS — E785 Hyperlipidemia, unspecified: Secondary | ICD-10-CM | POA: Diagnosis not present

## 2022-04-25 DIAGNOSIS — G8194 Hemiplegia, unspecified affecting left nondominant side: Secondary | ICD-10-CM | POA: Diagnosis not present

## 2022-04-25 DIAGNOSIS — M24562 Contracture, left knee: Secondary | ICD-10-CM | POA: Diagnosis not present

## 2022-04-25 DIAGNOSIS — F259 Schizoaffective disorder, unspecified: Secondary | ICD-10-CM | POA: Diagnosis not present

## 2022-04-25 DIAGNOSIS — M6281 Muscle weakness (generalized): Secondary | ICD-10-CM | POA: Diagnosis not present

## 2022-04-25 DIAGNOSIS — M24561 Contracture, right knee: Secondary | ICD-10-CM | POA: Diagnosis not present

## 2022-04-26 DIAGNOSIS — M24561 Contracture, right knee: Secondary | ICD-10-CM | POA: Diagnosis not present

## 2022-04-26 DIAGNOSIS — G8194 Hemiplegia, unspecified affecting left nondominant side: Secondary | ICD-10-CM | POA: Diagnosis not present

## 2022-04-26 DIAGNOSIS — M6281 Muscle weakness (generalized): Secondary | ICD-10-CM | POA: Diagnosis not present

## 2022-04-26 DIAGNOSIS — M24562 Contracture, left knee: Secondary | ICD-10-CM | POA: Diagnosis not present

## 2022-04-26 DIAGNOSIS — E785 Hyperlipidemia, unspecified: Secondary | ICD-10-CM | POA: Diagnosis not present

## 2022-04-26 DIAGNOSIS — F259 Schizoaffective disorder, unspecified: Secondary | ICD-10-CM | POA: Diagnosis not present

## 2022-04-28 DIAGNOSIS — G8194 Hemiplegia, unspecified affecting left nondominant side: Secondary | ICD-10-CM | POA: Diagnosis not present

## 2022-04-28 DIAGNOSIS — M24561 Contracture, right knee: Secondary | ICD-10-CM | POA: Diagnosis not present

## 2022-04-28 DIAGNOSIS — M24562 Contracture, left knee: Secondary | ICD-10-CM | POA: Diagnosis not present

## 2022-04-28 DIAGNOSIS — M6281 Muscle weakness (generalized): Secondary | ICD-10-CM | POA: Diagnosis not present

## 2022-04-28 DIAGNOSIS — F259 Schizoaffective disorder, unspecified: Secondary | ICD-10-CM | POA: Diagnosis not present

## 2022-04-28 DIAGNOSIS — E785 Hyperlipidemia, unspecified: Secondary | ICD-10-CM | POA: Diagnosis not present

## 2022-05-01 DIAGNOSIS — F259 Schizoaffective disorder, unspecified: Secondary | ICD-10-CM | POA: Diagnosis not present

## 2022-05-01 DIAGNOSIS — E785 Hyperlipidemia, unspecified: Secondary | ICD-10-CM | POA: Diagnosis not present

## 2022-05-01 DIAGNOSIS — M24562 Contracture, left knee: Secondary | ICD-10-CM | POA: Diagnosis not present

## 2022-05-01 DIAGNOSIS — M24561 Contracture, right knee: Secondary | ICD-10-CM | POA: Diagnosis not present

## 2022-05-01 DIAGNOSIS — M6281 Muscle weakness (generalized): Secondary | ICD-10-CM | POA: Diagnosis not present

## 2022-05-01 DIAGNOSIS — G8194 Hemiplegia, unspecified affecting left nondominant side: Secondary | ICD-10-CM | POA: Diagnosis not present

## 2022-05-02 DIAGNOSIS — G8194 Hemiplegia, unspecified affecting left nondominant side: Secondary | ICD-10-CM | POA: Diagnosis not present

## 2022-05-02 DIAGNOSIS — E785 Hyperlipidemia, unspecified: Secondary | ICD-10-CM | POA: Diagnosis not present

## 2022-05-02 DIAGNOSIS — M24561 Contracture, right knee: Secondary | ICD-10-CM | POA: Diagnosis not present

## 2022-05-02 DIAGNOSIS — M6281 Muscle weakness (generalized): Secondary | ICD-10-CM | POA: Diagnosis not present

## 2022-05-02 DIAGNOSIS — M24562 Contracture, left knee: Secondary | ICD-10-CM | POA: Diagnosis not present

## 2022-05-02 DIAGNOSIS — F259 Schizoaffective disorder, unspecified: Secondary | ICD-10-CM | POA: Diagnosis not present

## 2022-05-03 DIAGNOSIS — G8194 Hemiplegia, unspecified affecting left nondominant side: Secondary | ICD-10-CM | POA: Diagnosis not present

## 2022-05-03 DIAGNOSIS — M24561 Contracture, right knee: Secondary | ICD-10-CM | POA: Diagnosis not present

## 2022-05-03 DIAGNOSIS — E785 Hyperlipidemia, unspecified: Secondary | ICD-10-CM | POA: Diagnosis not present

## 2022-05-03 DIAGNOSIS — M24562 Contracture, left knee: Secondary | ICD-10-CM | POA: Diagnosis not present

## 2022-05-03 DIAGNOSIS — M6281 Muscle weakness (generalized): Secondary | ICD-10-CM | POA: Diagnosis not present

## 2022-05-03 DIAGNOSIS — F259 Schizoaffective disorder, unspecified: Secondary | ICD-10-CM | POA: Diagnosis not present

## 2022-05-04 DIAGNOSIS — M24561 Contracture, right knee: Secondary | ICD-10-CM | POA: Diagnosis not present

## 2022-05-04 DIAGNOSIS — E785 Hyperlipidemia, unspecified: Secondary | ICD-10-CM | POA: Diagnosis not present

## 2022-05-04 DIAGNOSIS — M6281 Muscle weakness (generalized): Secondary | ICD-10-CM | POA: Diagnosis not present

## 2022-05-04 DIAGNOSIS — M24562 Contracture, left knee: Secondary | ICD-10-CM | POA: Diagnosis not present

## 2022-05-04 DIAGNOSIS — F259 Schizoaffective disorder, unspecified: Secondary | ICD-10-CM | POA: Diagnosis not present

## 2022-05-04 DIAGNOSIS — G8194 Hemiplegia, unspecified affecting left nondominant side: Secondary | ICD-10-CM | POA: Diagnosis not present

## 2022-05-05 DIAGNOSIS — G8194 Hemiplegia, unspecified affecting left nondominant side: Secondary | ICD-10-CM | POA: Diagnosis not present

## 2022-05-05 DIAGNOSIS — E785 Hyperlipidemia, unspecified: Secondary | ICD-10-CM | POA: Diagnosis not present

## 2022-05-05 DIAGNOSIS — M24562 Contracture, left knee: Secondary | ICD-10-CM | POA: Diagnosis not present

## 2022-05-05 DIAGNOSIS — F259 Schizoaffective disorder, unspecified: Secondary | ICD-10-CM | POA: Diagnosis not present

## 2022-05-05 DIAGNOSIS — M24561 Contracture, right knee: Secondary | ICD-10-CM | POA: Diagnosis not present

## 2022-05-05 DIAGNOSIS — M6281 Muscle weakness (generalized): Secondary | ICD-10-CM | POA: Diagnosis not present

## 2022-05-08 DIAGNOSIS — M24562 Contracture, left knee: Secondary | ICD-10-CM | POA: Diagnosis not present

## 2022-05-08 DIAGNOSIS — M6281 Muscle weakness (generalized): Secondary | ICD-10-CM | POA: Diagnosis not present

## 2022-05-08 DIAGNOSIS — G8194 Hemiplegia, unspecified affecting left nondominant side: Secondary | ICD-10-CM | POA: Diagnosis not present

## 2022-05-08 DIAGNOSIS — F259 Schizoaffective disorder, unspecified: Secondary | ICD-10-CM | POA: Diagnosis not present

## 2022-05-08 DIAGNOSIS — E785 Hyperlipidemia, unspecified: Secondary | ICD-10-CM | POA: Diagnosis not present

## 2022-05-08 DIAGNOSIS — M24561 Contracture, right knee: Secondary | ICD-10-CM | POA: Diagnosis not present

## 2022-05-09 DIAGNOSIS — E785 Hyperlipidemia, unspecified: Secondary | ICD-10-CM | POA: Diagnosis not present

## 2022-05-09 DIAGNOSIS — M24562 Contracture, left knee: Secondary | ICD-10-CM | POA: Diagnosis not present

## 2022-05-09 DIAGNOSIS — M6281 Muscle weakness (generalized): Secondary | ICD-10-CM | POA: Diagnosis not present

## 2022-05-09 DIAGNOSIS — G8194 Hemiplegia, unspecified affecting left nondominant side: Secondary | ICD-10-CM | POA: Diagnosis not present

## 2022-05-09 DIAGNOSIS — F259 Schizoaffective disorder, unspecified: Secondary | ICD-10-CM | POA: Diagnosis not present

## 2022-05-09 DIAGNOSIS — M24561 Contracture, right knee: Secondary | ICD-10-CM | POA: Diagnosis not present

## 2022-05-10 DIAGNOSIS — F259 Schizoaffective disorder, unspecified: Secondary | ICD-10-CM | POA: Diagnosis not present

## 2022-05-10 DIAGNOSIS — M24561 Contracture, right knee: Secondary | ICD-10-CM | POA: Diagnosis not present

## 2022-05-10 DIAGNOSIS — M24562 Contracture, left knee: Secondary | ICD-10-CM | POA: Diagnosis not present

## 2022-05-10 DIAGNOSIS — G8194 Hemiplegia, unspecified affecting left nondominant side: Secondary | ICD-10-CM | POA: Diagnosis not present

## 2022-05-10 DIAGNOSIS — E785 Hyperlipidemia, unspecified: Secondary | ICD-10-CM | POA: Diagnosis not present

## 2022-05-10 DIAGNOSIS — M6281 Muscle weakness (generalized): Secondary | ICD-10-CM | POA: Diagnosis not present

## 2022-05-12 DIAGNOSIS — M24562 Contracture, left knee: Secondary | ICD-10-CM | POA: Diagnosis not present

## 2022-05-12 DIAGNOSIS — M6281 Muscle weakness (generalized): Secondary | ICD-10-CM | POA: Diagnosis not present

## 2022-05-12 DIAGNOSIS — G8194 Hemiplegia, unspecified affecting left nondominant side: Secondary | ICD-10-CM | POA: Diagnosis not present

## 2022-05-12 DIAGNOSIS — M24561 Contracture, right knee: Secondary | ICD-10-CM | POA: Diagnosis not present

## 2022-05-15 DIAGNOSIS — M24561 Contracture, right knee: Secondary | ICD-10-CM | POA: Diagnosis not present

## 2022-05-15 DIAGNOSIS — M6281 Muscle weakness (generalized): Secondary | ICD-10-CM | POA: Diagnosis not present

## 2022-05-15 DIAGNOSIS — M24562 Contracture, left knee: Secondary | ICD-10-CM | POA: Diagnosis not present

## 2022-05-15 DIAGNOSIS — G8194 Hemiplegia, unspecified affecting left nondominant side: Secondary | ICD-10-CM | POA: Diagnosis not present

## 2022-05-16 DIAGNOSIS — M6281 Muscle weakness (generalized): Secondary | ICD-10-CM | POA: Diagnosis not present

## 2022-05-16 DIAGNOSIS — M24561 Contracture, right knee: Secondary | ICD-10-CM | POA: Diagnosis not present

## 2022-05-16 DIAGNOSIS — M24562 Contracture, left knee: Secondary | ICD-10-CM | POA: Diagnosis not present

## 2022-05-16 DIAGNOSIS — G8194 Hemiplegia, unspecified affecting left nondominant side: Secondary | ICD-10-CM | POA: Diagnosis not present

## 2022-05-17 DIAGNOSIS — M24562 Contracture, left knee: Secondary | ICD-10-CM | POA: Diagnosis not present

## 2022-05-17 DIAGNOSIS — M24561 Contracture, right knee: Secondary | ICD-10-CM | POA: Diagnosis not present

## 2022-05-17 DIAGNOSIS — M6281 Muscle weakness (generalized): Secondary | ICD-10-CM | POA: Diagnosis not present

## 2022-05-17 DIAGNOSIS — G8194 Hemiplegia, unspecified affecting left nondominant side: Secondary | ICD-10-CM | POA: Diagnosis not present

## 2022-05-18 DIAGNOSIS — M24562 Contracture, left knee: Secondary | ICD-10-CM | POA: Diagnosis not present

## 2022-05-18 DIAGNOSIS — M6281 Muscle weakness (generalized): Secondary | ICD-10-CM | POA: Diagnosis not present

## 2022-05-18 DIAGNOSIS — M24561 Contracture, right knee: Secondary | ICD-10-CM | POA: Diagnosis not present

## 2022-05-18 DIAGNOSIS — G8194 Hemiplegia, unspecified affecting left nondominant side: Secondary | ICD-10-CM | POA: Diagnosis not present

## 2022-05-19 DIAGNOSIS — G8194 Hemiplegia, unspecified affecting left nondominant side: Secondary | ICD-10-CM | POA: Diagnosis not present

## 2022-05-19 DIAGNOSIS — M24561 Contracture, right knee: Secondary | ICD-10-CM | POA: Diagnosis not present

## 2022-05-19 DIAGNOSIS — M24562 Contracture, left knee: Secondary | ICD-10-CM | POA: Diagnosis not present

## 2022-05-19 DIAGNOSIS — M6281 Muscle weakness (generalized): Secondary | ICD-10-CM | POA: Diagnosis not present

## 2022-05-22 DIAGNOSIS — M24561 Contracture, right knee: Secondary | ICD-10-CM | POA: Diagnosis not present

## 2022-05-22 DIAGNOSIS — M24562 Contracture, left knee: Secondary | ICD-10-CM | POA: Diagnosis not present

## 2022-05-22 DIAGNOSIS — M6281 Muscle weakness (generalized): Secondary | ICD-10-CM | POA: Diagnosis not present

## 2022-05-22 DIAGNOSIS — F3130 Bipolar disorder, current episode depressed, mild or moderate severity, unspecified: Secondary | ICD-10-CM | POA: Diagnosis not present

## 2022-05-22 DIAGNOSIS — F200 Paranoid schizophrenia: Secondary | ICD-10-CM | POA: Diagnosis not present

## 2022-05-22 DIAGNOSIS — G8194 Hemiplegia, unspecified affecting left nondominant side: Secondary | ICD-10-CM | POA: Diagnosis not present

## 2022-05-23 DIAGNOSIS — M24561 Contracture, right knee: Secondary | ICD-10-CM | POA: Diagnosis not present

## 2022-05-23 DIAGNOSIS — M6281 Muscle weakness (generalized): Secondary | ICD-10-CM | POA: Diagnosis not present

## 2022-05-23 DIAGNOSIS — G8194 Hemiplegia, unspecified affecting left nondominant side: Secondary | ICD-10-CM | POA: Diagnosis not present

## 2022-05-23 DIAGNOSIS — M24562 Contracture, left knee: Secondary | ICD-10-CM | POA: Diagnosis not present

## 2022-05-24 DIAGNOSIS — M24561 Contracture, right knee: Secondary | ICD-10-CM | POA: Diagnosis not present

## 2022-05-24 DIAGNOSIS — G8194 Hemiplegia, unspecified affecting left nondominant side: Secondary | ICD-10-CM | POA: Diagnosis not present

## 2022-05-24 DIAGNOSIS — M6281 Muscle weakness (generalized): Secondary | ICD-10-CM | POA: Diagnosis not present

## 2022-05-24 DIAGNOSIS — M24562 Contracture, left knee: Secondary | ICD-10-CM | POA: Diagnosis not present

## 2022-05-25 DIAGNOSIS — G8194 Hemiplegia, unspecified affecting left nondominant side: Secondary | ICD-10-CM | POA: Diagnosis not present

## 2022-05-25 DIAGNOSIS — M24562 Contracture, left knee: Secondary | ICD-10-CM | POA: Diagnosis not present

## 2022-05-25 DIAGNOSIS — M24561 Contracture, right knee: Secondary | ICD-10-CM | POA: Diagnosis not present

## 2022-05-25 DIAGNOSIS — M6281 Muscle weakness (generalized): Secondary | ICD-10-CM | POA: Diagnosis not present

## 2022-05-26 DIAGNOSIS — M24562 Contracture, left knee: Secondary | ICD-10-CM | POA: Diagnosis not present

## 2022-05-26 DIAGNOSIS — M24561 Contracture, right knee: Secondary | ICD-10-CM | POA: Diagnosis not present

## 2022-05-26 DIAGNOSIS — K279 Peptic ulcer, site unspecified, unspecified as acute or chronic, without hemorrhage or perforation: Secondary | ICD-10-CM | POA: Diagnosis not present

## 2022-05-26 DIAGNOSIS — G8194 Hemiplegia, unspecified affecting left nondominant side: Secondary | ICD-10-CM | POA: Diagnosis not present

## 2022-05-26 DIAGNOSIS — M6281 Muscle weakness (generalized): Secondary | ICD-10-CM | POA: Diagnosis not present

## 2022-05-26 DIAGNOSIS — K5790 Diverticulosis of intestine, part unspecified, without perforation or abscess without bleeding: Secondary | ICD-10-CM | POA: Diagnosis not present

## 2022-05-29 DIAGNOSIS — M24562 Contracture, left knee: Secondary | ICD-10-CM | POA: Diagnosis not present

## 2022-05-29 DIAGNOSIS — M6281 Muscle weakness (generalized): Secondary | ICD-10-CM | POA: Diagnosis not present

## 2022-05-29 DIAGNOSIS — M24561 Contracture, right knee: Secondary | ICD-10-CM | POA: Diagnosis not present

## 2022-05-29 DIAGNOSIS — G8194 Hemiplegia, unspecified affecting left nondominant side: Secondary | ICD-10-CM | POA: Diagnosis not present

## 2022-05-30 DIAGNOSIS — M24562 Contracture, left knee: Secondary | ICD-10-CM | POA: Diagnosis not present

## 2022-05-30 DIAGNOSIS — K279 Peptic ulcer, site unspecified, unspecified as acute or chronic, without hemorrhage or perforation: Secondary | ICD-10-CM | POA: Diagnosis not present

## 2022-05-30 DIAGNOSIS — M24561 Contracture, right knee: Secondary | ICD-10-CM | POA: Diagnosis not present

## 2022-05-30 DIAGNOSIS — I10 Essential (primary) hypertension: Secondary | ICD-10-CM | POA: Diagnosis not present

## 2022-05-30 DIAGNOSIS — D519 Vitamin B12 deficiency anemia, unspecified: Secondary | ICD-10-CM | POA: Diagnosis not present

## 2022-05-30 DIAGNOSIS — G8194 Hemiplegia, unspecified affecting left nondominant side: Secondary | ICD-10-CM | POA: Diagnosis not present

## 2022-05-30 DIAGNOSIS — I69354 Hemiplegia and hemiparesis following cerebral infarction affecting left non-dominant side: Secondary | ICD-10-CM | POA: Diagnosis not present

## 2022-05-30 DIAGNOSIS — E119 Type 2 diabetes mellitus without complications: Secondary | ICD-10-CM | POA: Diagnosis not present

## 2022-05-30 DIAGNOSIS — M1990 Unspecified osteoarthritis, unspecified site: Secondary | ICD-10-CM | POA: Diagnosis not present

## 2022-05-30 DIAGNOSIS — D649 Anemia, unspecified: Secondary | ICD-10-CM | POA: Diagnosis not present

## 2022-05-30 DIAGNOSIS — E785 Hyperlipidemia, unspecified: Secondary | ICD-10-CM | POA: Diagnosis not present

## 2022-05-30 DIAGNOSIS — G629 Polyneuropathy, unspecified: Secondary | ICD-10-CM | POA: Diagnosis not present

## 2022-05-30 DIAGNOSIS — M6281 Muscle weakness (generalized): Secondary | ICD-10-CM | POA: Diagnosis not present

## 2022-05-30 DIAGNOSIS — E559 Vitamin D deficiency, unspecified: Secondary | ICD-10-CM | POA: Diagnosis not present

## 2022-05-31 DIAGNOSIS — F259 Schizoaffective disorder, unspecified: Secondary | ICD-10-CM | POA: Diagnosis not present

## 2022-05-31 DIAGNOSIS — N182 Chronic kidney disease, stage 2 (mild): Secondary | ICD-10-CM | POA: Diagnosis not present

## 2022-05-31 DIAGNOSIS — E785 Hyperlipidemia, unspecified: Secondary | ICD-10-CM | POA: Diagnosis not present

## 2022-05-31 DIAGNOSIS — K623 Rectal prolapse: Secondary | ICD-10-CM | POA: Diagnosis not present

## 2022-05-31 DIAGNOSIS — E119 Type 2 diabetes mellitus without complications: Secondary | ICD-10-CM | POA: Diagnosis not present

## 2022-05-31 DIAGNOSIS — M24562 Contracture, left knee: Secondary | ICD-10-CM | POA: Diagnosis not present

## 2022-05-31 DIAGNOSIS — G8194 Hemiplegia, unspecified affecting left nondominant side: Secondary | ICD-10-CM | POA: Diagnosis not present

## 2022-05-31 DIAGNOSIS — D631 Anemia in chronic kidney disease: Secondary | ICD-10-CM | POA: Diagnosis not present

## 2022-05-31 DIAGNOSIS — M6281 Muscle weakness (generalized): Secondary | ICD-10-CM | POA: Diagnosis not present

## 2022-05-31 DIAGNOSIS — K259 Gastric ulcer, unspecified as acute or chronic, without hemorrhage or perforation: Secondary | ICD-10-CM | POA: Diagnosis not present

## 2022-05-31 DIAGNOSIS — M24561 Contracture, right knee: Secondary | ICD-10-CM | POA: Diagnosis not present

## 2022-05-31 DIAGNOSIS — Z7189 Other specified counseling: Secondary | ICD-10-CM | POA: Diagnosis not present

## 2022-06-01 DIAGNOSIS — G8194 Hemiplegia, unspecified affecting left nondominant side: Secondary | ICD-10-CM | POA: Diagnosis not present

## 2022-06-01 DIAGNOSIS — M24561 Contracture, right knee: Secondary | ICD-10-CM | POA: Diagnosis not present

## 2022-06-01 DIAGNOSIS — M6281 Muscle weakness (generalized): Secondary | ICD-10-CM | POA: Diagnosis not present

## 2022-06-01 DIAGNOSIS — E119 Type 2 diabetes mellitus without complications: Secondary | ICD-10-CM | POA: Diagnosis not present

## 2022-06-01 DIAGNOSIS — M24562 Contracture, left knee: Secondary | ICD-10-CM | POA: Diagnosis not present

## 2022-06-02 DIAGNOSIS — M6281 Muscle weakness (generalized): Secondary | ICD-10-CM | POA: Diagnosis not present

## 2022-06-02 DIAGNOSIS — M24561 Contracture, right knee: Secondary | ICD-10-CM | POA: Diagnosis not present

## 2022-06-02 DIAGNOSIS — G8194 Hemiplegia, unspecified affecting left nondominant side: Secondary | ICD-10-CM | POA: Diagnosis not present

## 2022-06-02 DIAGNOSIS — M24562 Contracture, left knee: Secondary | ICD-10-CM | POA: Diagnosis not present

## 2022-06-02 DIAGNOSIS — E119 Type 2 diabetes mellitus without complications: Secondary | ICD-10-CM | POA: Diagnosis not present

## 2022-06-05 DIAGNOSIS — G8194 Hemiplegia, unspecified affecting left nondominant side: Secondary | ICD-10-CM | POA: Diagnosis not present

## 2022-06-05 DIAGNOSIS — R54 Age-related physical debility: Secondary | ICD-10-CM | POA: Diagnosis not present

## 2022-06-05 DIAGNOSIS — Z0189 Encounter for other specified special examinations: Secondary | ICD-10-CM | POA: Diagnosis not present

## 2022-06-05 DIAGNOSIS — M6281 Muscle weakness (generalized): Secondary | ICD-10-CM | POA: Diagnosis not present

## 2022-06-05 DIAGNOSIS — F259 Schizoaffective disorder, unspecified: Secondary | ICD-10-CM | POA: Diagnosis not present

## 2022-06-05 DIAGNOSIS — I69354 Hemiplegia and hemiparesis following cerebral infarction affecting left non-dominant side: Secondary | ICD-10-CM | POA: Diagnosis not present

## 2022-06-05 DIAGNOSIS — R262 Difficulty in walking, not elsewhere classified: Secondary | ICD-10-CM | POA: Diagnosis not present

## 2022-06-05 DIAGNOSIS — R634 Abnormal weight loss: Secondary | ICD-10-CM | POA: Diagnosis not present

## 2022-06-05 DIAGNOSIS — E559 Vitamin D deficiency, unspecified: Secondary | ICD-10-CM | POA: Diagnosis not present

## 2022-06-05 DIAGNOSIS — M24561 Contracture, right knee: Secondary | ICD-10-CM | POA: Diagnosis not present

## 2022-06-05 DIAGNOSIS — F0390 Unspecified dementia without behavioral disturbance: Secondary | ICD-10-CM | POA: Diagnosis not present

## 2022-06-05 DIAGNOSIS — M24562 Contracture, left knee: Secondary | ICD-10-CM | POA: Diagnosis not present

## 2022-06-05 DIAGNOSIS — Z7982 Long term (current) use of aspirin: Secondary | ICD-10-CM | POA: Diagnosis not present

## 2022-06-06 DIAGNOSIS — M6281 Muscle weakness (generalized): Secondary | ICD-10-CM | POA: Diagnosis not present

## 2022-06-06 DIAGNOSIS — G8194 Hemiplegia, unspecified affecting left nondominant side: Secondary | ICD-10-CM | POA: Diagnosis not present

## 2022-06-06 DIAGNOSIS — M24562 Contracture, left knee: Secondary | ICD-10-CM | POA: Diagnosis not present

## 2022-06-06 DIAGNOSIS — M24561 Contracture, right knee: Secondary | ICD-10-CM | POA: Diagnosis not present

## 2022-06-07 DIAGNOSIS — H16142 Punctate keratitis, left eye: Secondary | ICD-10-CM | POA: Diagnosis not present

## 2022-06-07 DIAGNOSIS — M6281 Muscle weakness (generalized): Secondary | ICD-10-CM | POA: Diagnosis not present

## 2022-06-07 DIAGNOSIS — G8194 Hemiplegia, unspecified affecting left nondominant side: Secondary | ICD-10-CM | POA: Diagnosis not present

## 2022-06-07 DIAGNOSIS — H25813 Combined forms of age-related cataract, bilateral: Secondary | ICD-10-CM | POA: Diagnosis not present

## 2022-06-07 DIAGNOSIS — M24561 Contracture, right knee: Secondary | ICD-10-CM | POA: Diagnosis not present

## 2022-06-07 DIAGNOSIS — M24562 Contracture, left knee: Secondary | ICD-10-CM | POA: Diagnosis not present

## 2022-06-08 DIAGNOSIS — G8194 Hemiplegia, unspecified affecting left nondominant side: Secondary | ICD-10-CM | POA: Diagnosis not present

## 2022-06-08 DIAGNOSIS — M6281 Muscle weakness (generalized): Secondary | ICD-10-CM | POA: Diagnosis not present

## 2022-06-08 DIAGNOSIS — M24561 Contracture, right knee: Secondary | ICD-10-CM | POA: Diagnosis not present

## 2022-06-08 DIAGNOSIS — M24562 Contracture, left knee: Secondary | ICD-10-CM | POA: Diagnosis not present

## 2022-06-09 DIAGNOSIS — M24562 Contracture, left knee: Secondary | ICD-10-CM | POA: Diagnosis not present

## 2022-06-09 DIAGNOSIS — M24561 Contracture, right knee: Secondary | ICD-10-CM | POA: Diagnosis not present

## 2022-06-09 DIAGNOSIS — M6281 Muscle weakness (generalized): Secondary | ICD-10-CM | POA: Diagnosis not present

## 2022-06-09 DIAGNOSIS — G8194 Hemiplegia, unspecified affecting left nondominant side: Secondary | ICD-10-CM | POA: Diagnosis not present

## 2022-06-12 DIAGNOSIS — R293 Abnormal posture: Secondary | ICD-10-CM | POA: Diagnosis not present

## 2022-06-12 DIAGNOSIS — M24562 Contracture, left knee: Secondary | ICD-10-CM | POA: Diagnosis not present

## 2022-06-12 DIAGNOSIS — M24561 Contracture, right knee: Secondary | ICD-10-CM | POA: Diagnosis not present

## 2022-06-12 DIAGNOSIS — M6281 Muscle weakness (generalized): Secondary | ICD-10-CM | POA: Diagnosis not present

## 2022-06-12 DIAGNOSIS — G8194 Hemiplegia, unspecified affecting left nondominant side: Secondary | ICD-10-CM | POA: Diagnosis not present

## 2022-06-13 DIAGNOSIS — M6281 Muscle weakness (generalized): Secondary | ICD-10-CM | POA: Diagnosis not present

## 2022-06-13 DIAGNOSIS — M24562 Contracture, left knee: Secondary | ICD-10-CM | POA: Diagnosis not present

## 2022-06-13 DIAGNOSIS — M24561 Contracture, right knee: Secondary | ICD-10-CM | POA: Diagnosis not present

## 2022-06-13 DIAGNOSIS — R293 Abnormal posture: Secondary | ICD-10-CM | POA: Diagnosis not present

## 2022-06-13 DIAGNOSIS — G8194 Hemiplegia, unspecified affecting left nondominant side: Secondary | ICD-10-CM | POA: Diagnosis not present

## 2022-06-14 DIAGNOSIS — G2581 Restless legs syndrome: Secondary | ICD-10-CM | POA: Diagnosis not present

## 2022-06-14 DIAGNOSIS — M24561 Contracture, right knee: Secondary | ICD-10-CM | POA: Diagnosis not present

## 2022-06-14 DIAGNOSIS — K623 Rectal prolapse: Secondary | ICD-10-CM | POA: Diagnosis not present

## 2022-06-14 DIAGNOSIS — M24562 Contracture, left knee: Secondary | ICD-10-CM | POA: Diagnosis not present

## 2022-06-14 DIAGNOSIS — M549 Dorsalgia, unspecified: Secondary | ICD-10-CM | POA: Diagnosis not present

## 2022-06-14 DIAGNOSIS — R293 Abnormal posture: Secondary | ICD-10-CM | POA: Diagnosis not present

## 2022-06-14 DIAGNOSIS — M6281 Muscle weakness (generalized): Secondary | ICD-10-CM | POA: Diagnosis not present

## 2022-06-14 DIAGNOSIS — G8194 Hemiplegia, unspecified affecting left nondominant side: Secondary | ICD-10-CM | POA: Diagnosis not present

## 2022-06-15 DIAGNOSIS — G8194 Hemiplegia, unspecified affecting left nondominant side: Secondary | ICD-10-CM | POA: Diagnosis not present

## 2022-06-15 DIAGNOSIS — M24562 Contracture, left knee: Secondary | ICD-10-CM | POA: Diagnosis not present

## 2022-06-15 DIAGNOSIS — M24561 Contracture, right knee: Secondary | ICD-10-CM | POA: Diagnosis not present

## 2022-06-15 DIAGNOSIS — R293 Abnormal posture: Secondary | ICD-10-CM | POA: Diagnosis not present

## 2022-06-15 DIAGNOSIS — M6281 Muscle weakness (generalized): Secondary | ICD-10-CM | POA: Diagnosis not present

## 2022-06-16 DIAGNOSIS — G8194 Hemiplegia, unspecified affecting left nondominant side: Secondary | ICD-10-CM | POA: Diagnosis not present

## 2022-06-16 DIAGNOSIS — G4734 Idiopathic sleep related nonobstructive alveolar hypoventilation: Secondary | ICD-10-CM | POA: Diagnosis not present

## 2022-06-16 DIAGNOSIS — D62 Acute posthemorrhagic anemia: Secondary | ICD-10-CM | POA: Diagnosis not present

## 2022-06-16 DIAGNOSIS — M24561 Contracture, right knee: Secondary | ICD-10-CM | POA: Diagnosis not present

## 2022-06-16 DIAGNOSIS — M24562 Contracture, left knee: Secondary | ICD-10-CM | POA: Diagnosis not present

## 2022-06-16 DIAGNOSIS — Z79899 Other long term (current) drug therapy: Secondary | ICD-10-CM | POA: Diagnosis not present

## 2022-06-16 DIAGNOSIS — M6281 Muscle weakness (generalized): Secondary | ICD-10-CM | POA: Diagnosis not present

## 2022-06-16 DIAGNOSIS — R293 Abnormal posture: Secondary | ICD-10-CM | POA: Diagnosis not present

## 2022-06-16 DIAGNOSIS — N182 Chronic kidney disease, stage 2 (mild): Secondary | ICD-10-CM | POA: Diagnosis not present

## 2022-06-19 DIAGNOSIS — M24561 Contracture, right knee: Secondary | ICD-10-CM | POA: Diagnosis not present

## 2022-06-19 DIAGNOSIS — M6281 Muscle weakness (generalized): Secondary | ICD-10-CM | POA: Diagnosis not present

## 2022-06-19 DIAGNOSIS — G8194 Hemiplegia, unspecified affecting left nondominant side: Secondary | ICD-10-CM | POA: Diagnosis not present

## 2022-06-19 DIAGNOSIS — R293 Abnormal posture: Secondary | ICD-10-CM | POA: Diagnosis not present

## 2022-06-19 DIAGNOSIS — M24562 Contracture, left knee: Secondary | ICD-10-CM | POA: Diagnosis not present

## 2022-06-20 DIAGNOSIS — M24562 Contracture, left knee: Secondary | ICD-10-CM | POA: Diagnosis not present

## 2022-06-20 DIAGNOSIS — M24561 Contracture, right knee: Secondary | ICD-10-CM | POA: Diagnosis not present

## 2022-06-20 DIAGNOSIS — R293 Abnormal posture: Secondary | ICD-10-CM | POA: Diagnosis not present

## 2022-06-20 DIAGNOSIS — G8194 Hemiplegia, unspecified affecting left nondominant side: Secondary | ICD-10-CM | POA: Diagnosis not present

## 2022-06-20 DIAGNOSIS — M6281 Muscle weakness (generalized): Secondary | ICD-10-CM | POA: Diagnosis not present

## 2022-06-21 DIAGNOSIS — M6281 Muscle weakness (generalized): Secondary | ICD-10-CM | POA: Diagnosis not present

## 2022-06-21 DIAGNOSIS — M24562 Contracture, left knee: Secondary | ICD-10-CM | POA: Diagnosis not present

## 2022-06-21 DIAGNOSIS — R293 Abnormal posture: Secondary | ICD-10-CM | POA: Diagnosis not present

## 2022-06-21 DIAGNOSIS — M24561 Contracture, right knee: Secondary | ICD-10-CM | POA: Diagnosis not present

## 2022-06-21 DIAGNOSIS — G8194 Hemiplegia, unspecified affecting left nondominant side: Secondary | ICD-10-CM | POA: Diagnosis not present

## 2022-06-22 DIAGNOSIS — G8194 Hemiplegia, unspecified affecting left nondominant side: Secondary | ICD-10-CM | POA: Diagnosis not present

## 2022-06-22 DIAGNOSIS — R293 Abnormal posture: Secondary | ICD-10-CM | POA: Diagnosis not present

## 2022-06-22 DIAGNOSIS — M24561 Contracture, right knee: Secondary | ICD-10-CM | POA: Diagnosis not present

## 2022-06-22 DIAGNOSIS — M24562 Contracture, left knee: Secondary | ICD-10-CM | POA: Diagnosis not present

## 2022-06-22 DIAGNOSIS — M6281 Muscle weakness (generalized): Secondary | ICD-10-CM | POA: Diagnosis not present

## 2022-06-23 DIAGNOSIS — G8194 Hemiplegia, unspecified affecting left nondominant side: Secondary | ICD-10-CM | POA: Diagnosis not present

## 2022-06-23 DIAGNOSIS — M24561 Contracture, right knee: Secondary | ICD-10-CM | POA: Diagnosis not present

## 2022-06-23 DIAGNOSIS — R293 Abnormal posture: Secondary | ICD-10-CM | POA: Diagnosis not present

## 2022-06-23 DIAGNOSIS — M24562 Contracture, left knee: Secondary | ICD-10-CM | POA: Diagnosis not present

## 2022-06-23 DIAGNOSIS — M6281 Muscle weakness (generalized): Secondary | ICD-10-CM | POA: Diagnosis not present

## 2022-06-26 DIAGNOSIS — M24561 Contracture, right knee: Secondary | ICD-10-CM | POA: Diagnosis not present

## 2022-06-26 DIAGNOSIS — M24562 Contracture, left knee: Secondary | ICD-10-CM | POA: Diagnosis not present

## 2022-06-26 DIAGNOSIS — R293 Abnormal posture: Secondary | ICD-10-CM | POA: Diagnosis not present

## 2022-06-26 DIAGNOSIS — G8194 Hemiplegia, unspecified affecting left nondominant side: Secondary | ICD-10-CM | POA: Diagnosis not present

## 2022-06-26 DIAGNOSIS — M6281 Muscle weakness (generalized): Secondary | ICD-10-CM | POA: Diagnosis not present

## 2022-06-28 DIAGNOSIS — R293 Abnormal posture: Secondary | ICD-10-CM | POA: Diagnosis not present

## 2022-06-28 DIAGNOSIS — M24561 Contracture, right knee: Secondary | ICD-10-CM | POA: Diagnosis not present

## 2022-06-28 DIAGNOSIS — M24562 Contracture, left knee: Secondary | ICD-10-CM | POA: Diagnosis not present

## 2022-06-28 DIAGNOSIS — G8194 Hemiplegia, unspecified affecting left nondominant side: Secondary | ICD-10-CM | POA: Diagnosis not present

## 2022-06-28 DIAGNOSIS — M6281 Muscle weakness (generalized): Secondary | ICD-10-CM | POA: Diagnosis not present

## 2022-06-29 DIAGNOSIS — M24562 Contracture, left knee: Secondary | ICD-10-CM | POA: Diagnosis not present

## 2022-06-29 DIAGNOSIS — E119 Type 2 diabetes mellitus without complications: Secondary | ICD-10-CM | POA: Diagnosis not present

## 2022-06-29 DIAGNOSIS — M24561 Contracture, right knee: Secondary | ICD-10-CM | POA: Diagnosis not present

## 2022-06-29 DIAGNOSIS — M6281 Muscle weakness (generalized): Secondary | ICD-10-CM | POA: Diagnosis not present

## 2022-06-29 DIAGNOSIS — R293 Abnormal posture: Secondary | ICD-10-CM | POA: Diagnosis not present

## 2022-06-29 DIAGNOSIS — G8194 Hemiplegia, unspecified affecting left nondominant side: Secondary | ICD-10-CM | POA: Diagnosis not present

## 2022-07-03 DIAGNOSIS — M6281 Muscle weakness (generalized): Secondary | ICD-10-CM | POA: Diagnosis not present

## 2022-07-03 DIAGNOSIS — R293 Abnormal posture: Secondary | ICD-10-CM | POA: Diagnosis not present

## 2022-07-03 DIAGNOSIS — G8194 Hemiplegia, unspecified affecting left nondominant side: Secondary | ICD-10-CM | POA: Diagnosis not present

## 2022-07-03 DIAGNOSIS — M24561 Contracture, right knee: Secondary | ICD-10-CM | POA: Diagnosis not present

## 2022-07-03 DIAGNOSIS — M24562 Contracture, left knee: Secondary | ICD-10-CM | POA: Diagnosis not present

## 2022-07-04 DIAGNOSIS — M24562 Contracture, left knee: Secondary | ICD-10-CM | POA: Diagnosis not present

## 2022-07-04 DIAGNOSIS — M6281 Muscle weakness (generalized): Secondary | ICD-10-CM | POA: Diagnosis not present

## 2022-07-04 DIAGNOSIS — G8194 Hemiplegia, unspecified affecting left nondominant side: Secondary | ICD-10-CM | POA: Diagnosis not present

## 2022-07-04 DIAGNOSIS — M24561 Contracture, right knee: Secondary | ICD-10-CM | POA: Diagnosis not present

## 2022-07-04 DIAGNOSIS — R293 Abnormal posture: Secondary | ICD-10-CM | POA: Diagnosis not present

## 2022-07-05 DIAGNOSIS — G8194 Hemiplegia, unspecified affecting left nondominant side: Secondary | ICD-10-CM | POA: Diagnosis not present

## 2022-07-05 DIAGNOSIS — M24561 Contracture, right knee: Secondary | ICD-10-CM | POA: Diagnosis not present

## 2022-07-05 DIAGNOSIS — M6281 Muscle weakness (generalized): Secondary | ICD-10-CM | POA: Diagnosis not present

## 2022-07-05 DIAGNOSIS — M24562 Contracture, left knee: Secondary | ICD-10-CM | POA: Diagnosis not present

## 2022-07-05 DIAGNOSIS — R293 Abnormal posture: Secondary | ICD-10-CM | POA: Diagnosis not present

## 2022-07-06 DIAGNOSIS — G8194 Hemiplegia, unspecified affecting left nondominant side: Secondary | ICD-10-CM | POA: Diagnosis not present

## 2022-07-06 DIAGNOSIS — M24561 Contracture, right knee: Secondary | ICD-10-CM | POA: Diagnosis not present

## 2022-07-06 DIAGNOSIS — M24562 Contracture, left knee: Secondary | ICD-10-CM | POA: Diagnosis not present

## 2022-07-06 DIAGNOSIS — R293 Abnormal posture: Secondary | ICD-10-CM | POA: Diagnosis not present

## 2022-07-06 DIAGNOSIS — M6281 Muscle weakness (generalized): Secondary | ICD-10-CM | POA: Diagnosis not present

## 2022-07-07 ENCOUNTER — Ambulatory Visit (INDEPENDENT_AMBULATORY_CARE_PROVIDER_SITE_OTHER): Payer: Medicare PPO | Admitting: Physician Assistant

## 2022-07-07 ENCOUNTER — Encounter: Payer: Self-pay | Admitting: Physician Assistant

## 2022-07-07 VITALS — BP 138/74 | HR 60 | Ht 65.0 in | Wt 154.0 lb

## 2022-07-07 DIAGNOSIS — K623 Rectal prolapse: Secondary | ICD-10-CM

## 2022-07-07 DIAGNOSIS — I739 Peripheral vascular disease, unspecified: Secondary | ICD-10-CM | POA: Diagnosis not present

## 2022-07-07 DIAGNOSIS — L603 Nail dystrophy: Secondary | ICD-10-CM | POA: Diagnosis not present

## 2022-07-07 DIAGNOSIS — R293 Abnormal posture: Secondary | ICD-10-CM | POA: Diagnosis not present

## 2022-07-07 DIAGNOSIS — M24561 Contracture, right knee: Secondary | ICD-10-CM | POA: Diagnosis not present

## 2022-07-07 DIAGNOSIS — M6281 Muscle weakness (generalized): Secondary | ICD-10-CM | POA: Diagnosis not present

## 2022-07-07 DIAGNOSIS — M2041 Other hammer toe(s) (acquired), right foot: Secondary | ICD-10-CM | POA: Diagnosis not present

## 2022-07-07 DIAGNOSIS — B351 Tinea unguium: Secondary | ICD-10-CM | POA: Diagnosis not present

## 2022-07-07 DIAGNOSIS — M2042 Other hammer toe(s) (acquired), left foot: Secondary | ICD-10-CM | POA: Diagnosis not present

## 2022-07-07 DIAGNOSIS — M24562 Contracture, left knee: Secondary | ICD-10-CM | POA: Diagnosis not present

## 2022-07-07 DIAGNOSIS — G8194 Hemiplegia, unspecified affecting left nondominant side: Secondary | ICD-10-CM | POA: Diagnosis not present

## 2022-07-07 NOTE — Patient Instructions (Signed)
_______________________________________________________  If your blood pressure at your visit was 140/90 or greater, please contact your primary care physician to follow up on this. _______________________________________________________  If you are age 83 or older, your body mass index should be between 23-30. Your Body mass index is 25.63 kg/m. If this is out of the aforementioned range listed, please consider follow up with your Primary Care Provider. ________________________________________________________  The [facility] providers would like to encourage you to use [facility] to communicate with providers for non-urgent requests or questions.  Due to long hold times on the telephone, sending your provider a message by [facility] may be a faster and more efficient way to get a response.  Please allow 48 business hours for a response.  Please remember that this is for non-urgent requests.  _______________________________________________________  Bonita Quin have been scheduled for an appointment with _________ at [facility]. Your appointment is on ______ at _______. Please arrive at _________ for registration. Make certain to bring a list of current medications, including any over the counter medications or vitamins. Also bring your co-pay if you have one as well as your insurance cards. [facility] is located at [address]. Should you need to reschedule your appointment, please contact them at [phone].  Thank you for entrusting me with your care and choosing [facility].  Hyacinth Meeker, PA-C

## 2022-07-07 NOTE — Progress Notes (Signed)
Chief Complaint: Prolapsed rectum  HPI:    [name] is an 82 year old female with a past medical history as listed below including stroke with multiple prior surgeries including appendectomy, bowel resection, inguinal hernia repair, hysterectomy with salpingo-oophorectomy and vaginal prolapse repair, known to Dr. Leone Payor, who was referred to me by Larae Grooms, NP for a complaint of rectal prolapse.      02/15/2022 patient consulted by our team at the hospital for heme positive anemia.  At that time underwent EGD and colonoscopy.    02/17/2022 colonoscopy with decreased sphincter tone and diverticulosis in the sigmoid colon as well as nonbleeding external and internal hemorrhoids.  EGD with large hiatal hernia and nonbleeding gastric ulcers and flat mucosa in the duodenum.  Biopsies negative for celiac.    02/19/2022 CBC with a hemoglobin of 8.6.    Today, patient is seen with her daughter who helps with history, patient resides at [facility] and per nursing staff there patient has had a few issues with rectal prolapse.  They have been able to reduce her prolapse easily and patient tells me that it is "not always happening".  She tells me she has soft easy bowel movements with no straining.  She thinks she has had prolapse maybe a couple of times where they have had to push it back in and other times it just "goes back in by itself".  This has been occurring over the past month.  No bleeding, stool leakage or pain.    Denies fever, chills, weight loss, abdominal pain or symptoms that awaken her from sleep.  Past Medical History:  Diagnosis Date   Anemia    Colon polyp    History of small bowel obstruction 9/09   likely due to adhesions- pt refused most dx and tx in hospital   History of vaginal bleeding    post menopausal- gyn eval, ? polyp   Hyperlipidemia    Hypertension    Osteoarthritis    knee   Schizoaffective disorder    paranoid personality- refuses treatment   Stroke (HCC)     Vitamin D deficiency     Past Surgical History:  Procedure Laterality Date   ANTERIOR AND POSTERIOR REPAIR N/A 04/23/2012   Procedure: ANTERIOR   REPAIR CYSTOCELE;  Surgeon: Martina Sinner, MD;  Location: [facility];  Service: Urology;  Laterality: N/A;  cysto;graft 10x6   APPENDECTOMY     BIOPSY  02/17/2022   Procedure: BIOPSY;  Surgeon: Napoleon Form, MD;  Location: [facility];  Service: Gastroenterology;;   bladder tack  1976   BOWEL RESECTION N/A 12/02/2020   Procedure: SMALL BOWEL RESECTION;  Surgeon: Sung Amabile, DO;  Location: [facility];  Service: General;  Laterality: N/A;   COLONOSCOPY WITH PROPOFOL N/A 02/17/2022   Procedure: COLONOSCOPY WITH PROPOFOL;  Surgeon: Napoleon Form, MD;  Location: [facility];  Service: Gastroenterology;  Laterality: N/A;   ESOPHAGOGASTRODUODENOSCOPY (EGD) WITH PROPOFOL N/A 02/17/2022   Procedure: ESOPHAGOGASTRODUODENOSCOPY (EGD) WITH PROPOFOL;  Surgeon: Napoleon Form, MD;  Location: [facility];  Service: Gastroenterology;  Laterality: N/A;   INGUINAL HERNIA REPAIR Right 12/02/2020   Procedure: HERNIA REPAIR INGUINAL ADULT;  Surgeon: Sung Amabile, DO;  Location: [facility];  Service: General;  Laterality: Right;   KNEE ARTHROSCOPY     right   KNEE ARTHROSCOPY Right    KNEE CLOSED REDUCTION Right 12/07/2020   Procedure: CLOSED MANIPULATION KNEE;  Surgeon: Lyndle Herrlich, MD;  Location: [facility];  Service: Orthopedics;  Laterality:  Right;   KNEE SURGERY Right    LAPAROSCOPY  1970's   twisted fallopian tube   SALPINGOOPHORECTOMY Bilateral 04/23/2012   Procedure: SALPINGO OOPHORECTOMY;  Surgeon: Allie Bossier, MD;  Location: [facility];  Service: Gynecology;  Laterality: Bilateral;   TONSILLECTOMY     TOTAL KNEE REVISION Right 12/24/2020   Procedure: TOTAL KNEE REVISION;  Surgeon: Lyndle Herrlich, MD;  Location: [facility];  Service: Orthopedics;  Laterality: Right;   VAGINAL HYSTERECTOMY N/A 04/23/2012   Procedure: HYSTERECTOMY VAGINAL;   Surgeon: Allie Bossier, MD;  Location: [facility];  Service: Gynecology;  Laterality: N/A;   VAGINAL PROLAPSE REPAIR N/A 04/23/2012   Procedure: VAGINAL VAULT SUSPENSION;  Surgeon: Martina Sinner, MD;  Location: [facility];  Service: Urology;  Laterality: N/A;    Current Outpatient Medications  Medication Sig Dispense Refill   acetaminophen (TYLENOL) 500 MG tablet Take 1,000 mg by mouth 2 (two) times daily.     amLODipine (NORVASC) 5 MG tablet Take 5 mg by mouth See admin instructions. Take 5 mg by mouth once a day and hold for a Systolic reading <409     Artificial Saliva (BIOTENE DRY MOUTH MOISTURIZING) SOLN Use as directed 1 spray in the mouth or throat every other day.     aspirin 81 MG chewable tablet Chew 1 tablet (81 mg total) by mouth daily.     carvedilol (COREG) 6.25 MG tablet Take 1 tablet (6.25 mg total) by mouth 2 (two) times daily with a meal. (Patient taking differently: Take 6.25 mg by mouth 2 (two) times daily as needed (for Systolic reading >811 and/or Diastolic reading >914).) 180 tablet 1   diclofenac Sodium (VOLTAREN) 1 % GEL Apply 2 g topically See admin instructions. Apply 2 grams to joints/ knuckles- right hand     ferrous sulfate 325 (65 FE) MG tablet Take 1 tablet (325 mg total) by mouth daily. 30 tablet 3   lidocaine (LMX) 4 % cream Apply 1 Application topically See admin instructions. Apply to both feet at bedtime     Menthol, Topical Analgesic, (BIOFREEZE) 4 % GEL Apply 1 application  topically See admin instructions. Apply to neck and knees 2 times a day     methocarbamol (ROBAXIN) 500 MG tablet Take 250-500 mg by mouth See admin instructions. Take 250 mg by mouth in the morning and 500 mg at bedtime     Multiple Vitamins-Minerals (DECUBI-VITE PO) Take 1 capsule by mouth daily.     Nutritional Supplement LIQD Take 120 mLs by mouth 2 (two) times daily.     nystatin (MYCOSTATIN/NYSTOP) powder Apply 1 application  topically See admin instructions. Apply to buttocks/sacral area  2 times a day after washing/drying the area     pantoprazole (PROTONIX) 40 MG tablet Take 1 tablet (40 mg total) by mouth 2 (two) times daily before a meal.     polyethylene glycol (MIRALAX / GLYCOLAX) 17 g packet Take 17 g by mouth daily. 14 each 0   risperiDONE (RISPERDAL) 0.5 MG tablet Take 0.5 mg by mouth 2 (two) times daily.     rosuvastatin (CRESTOR) 10 MG tablet Take 1 tablet (10 mg total) by mouth daily. (Patient taking differently: Take 10 mg by mouth at bedtime.) 90 tablet 1   sennosides-docusate sodium (SENOKOT-S) 8.6-50 MG tablet Take 1 tablet by mouth in the morning and at bedtime.     No current facility-administered medications for this visit.    Allergies as of 07/07/2022 - Review Complete 02/17/2022  Allergen Reaction Noted   Penicillins Rash and Other (See Comments) 10/22/2006    Family History  Problem Relation Age of Onset   Heart disease Mother    Diabetes Mother    Skin cancer Mother    Diabetes Father    Heart disease Father    Hypertension Father    Diabetes Brother    Arthritis Brother    Hypertension Brother    Hyperlipidemia Brother    Arthritis Daughter    Cancer Son    Obesity Daughter     Social History   Socioeconomic History   Marital status: Single    Spouse name: Not on file   Number of children: 3   Years of education: Not on file   Highest education level: Not on file  Occupational History   Occupation: retired Runner, broadcasting/film/video  Tobacco Use   Smoking status: Former    Packs/day: 0.50    Years: 20.00    Additional pack years: 0.00    Total pack years: 10.00    Types: Cigarettes    Quit date: 03/13/1982    Years since quitting: 40.3   Smokeless tobacco: Never  Vaping Use   Vaping Use: Never used  Substance and Sexual Activity   Alcohol use: No   Drug use: No   Sexual activity: Yes    Birth control/protection: Post-menopausal  Other Topics Concern   Not on file  Social History Narrative   ** Merged History Encounter **       Lives  alone   Social Determinants of Health   Financial Resource Strain: Not on file  Food Insecurity: No Food Insecurity (02/16/2022)   Hunger Vital Sign    Worried About Running Out of Food in the Last Year: Never true    Ran Out of Food in the Last Year: Never true  Transportation Needs: No Transportation Needs (02/16/2022)   PRAPARE - Administrator, Civil Service (Medical): No    Lack of Transportation (Non-Medical): No  Physical Activity: Not on file  Stress: Not on file  Social Connections: Not on file  Intimate Partner Violence: Not At Risk (02/16/2022)   Humiliation, Afraid, Rape, and Kick questionnaire    Fear of Current or Ex-Partner: No    Emotionally Abused: No    Physically Abused: No    Sexually Abused: No    Review of Systems:    Constitutional: No weight loss, fever or chills  Cardiovascular: No chest pain Respiratory: No SOB  Gastrointestinal: See HPI and otherwise negative    Physical Exam:  Vital signs: BP 138/74   Pulse 60   Ht 5\' 5"  (1.651 m)   Wt 154 lb (69.9 kg)   BMI 25.63 kg/m    Constitutional:   Pleasant Elderly chronically ill appearing Caucasian female appears to be in NAD, Well developed, Well nourished, alert and cooperative Respiratory: Respirations even and unlabored. Lungs clear to auscultation bilaterally.   No wheezes, crackles, or rhonchi.  Cardiovascular: Normal S1, S2. No MRG. Regular rate and rhythm. No peripheral edema, cyanosis or pallor.  Gastrointestinal:  Soft, nondistended, nontender. No rebound or guarding. Normal bowel sounds. No appreciable masses or hepatomegaly. Rectal:  External: no hemorrhoids or abnormality, decreased sphincter tone; Internal: decreased sphincter tone; no prolapse with valsalva today; Anoscopy: healthy appearing mucosa with some sign of prior prolapse Msk:  Symmetrical without gross deformities. Without edema, no deformity or joint abnormality. +in wheelchair Psychiatric: Oriented to person, place  and time. Demonstrates good judgement  and reason without abnormal affect or behaviors.  RELEVANT LABS AND IMAGING: CBC    Component Value Date/Time   WBC 4.9 02/19/2022 0458   RBC 4.20 02/19/2022 0458   HGB 8.6 (L) 02/19/2022 0458   HGB 10.1 (L) 09/09/2020 1146   HCT 30.0 (L) 02/19/2022 0458   HCT 33.5 (L) 09/09/2020 1146   PLT 262 02/19/2022 0458   PLT 301 09/09/2020 1146   MCV 71.4 (L) 02/19/2022 0458   MCV 80 09/09/2020 1146   MCH 20.5 (L) 02/19/2022 0458   MCHC 28.7 (L) 02/19/2022 0458   RDW 28.1 (H) 02/19/2022 0458   RDW 16.4 (H) 09/09/2020 1146   LYMPHSABS 1.2 02/14/2022 1652   LYMPHSABS 1.7 09/09/2020 1146   MONOABS 0.5 02/14/2022 1652   EOSABS 0.2 02/14/2022 1652   EOSABS 0.3 09/09/2020 1146   BASOSABS 0.0 02/14/2022 1652   BASOSABS 0.0 09/09/2020 1146    CMP     Component Value Date/Time   NA 136 02/20/2022 0501   NA 142 11/09/2020 1633   K 3.9 02/20/2022 0501   CL 106 02/20/2022 0501   CO2 21 (L) 02/20/2022 0501   GLUCOSE 81 02/20/2022 0501   BUN 13 02/20/2022 0501   BUN 19 11/09/2020 1633   CREATININE 0.45 02/20/2022 0501   CALCIUM 9.2 02/20/2022 0501   PROT 6.9 02/14/2022 1652   PROT 6.8 09/09/2020 1146   ALBUMIN 4.2 02/14/2022 1652   ALBUMIN 4.7 (H) 09/09/2020 1146   AST 14 (L) 02/14/2022 1652   ALT 10 02/14/2022 1652   ALKPHOS 44 02/14/2022 1652   BILITOT 0.5 02/14/2022 1652   BILITOT 0.4 09/09/2020 1146   GFRNONAA >60 02/20/2022 0501   GFRAA  06/29/2008 0630    >60        The eGFR has been calculated using the MDRD equation. This calculation has not been validated in all clinical situations. eGFR's persistently <60 mL/min signify possible Chronic Kidney Disease.    Assessment: 1.  Rectal prolapse: Here today for evaluation of rectal prolapse, apparently occurred twice over the past month where she has had to have it reduced manually, otherwise it reduces by itself, regular bowel movements, no straining, no rectal pain, bleeding or stool  leakage 2.  Anemia: In the hospital back in December had an EGD and colonoscopy, since that time she has resided at [facility] and they have been monitoring her labs per her daughter and they were told her last hemoglobin is almost normal around 11-1/2, no further signs of anemia per the daughter  Plan: 1.  Changes of rectal prolapse seen at time of anoscopy today but still healthy-appearing mucosa, could not reproduce prolapse with Valsalva at time of exam today.  Explained to the patient that all of this is a good sign.  I do not think that it is urgent that she have this prolapse repaired.  She is not eager to have surgery at her age after recent stroke.  Even just getting out of [facility] is strenuous for her and having to be moved to the exam tables excetra. 2.  Will go ahead and refer patient to [facility] for further evaluation of this rectal prolapse.  Explained that it could be 1 to 2 months from now that she has an appointment.  She is not even sure that she wants 1 at this point in time.  Discussed reasons why she would need repair of prolapse in the future including pain/bleeding or if it became irreducible.  She and  her daughter verbalized understanding.  They will follow-up with [facility] for further information in regards to repair if they choose to do this in the future. 3.  Patient to follow-up with [place] as needed.  Hyacinth Meeker, PA-C [facility] 07/07/2022, 9:14 AM  Cc: Larae Grooms, NP

## 2022-07-10 DIAGNOSIS — M24561 Contracture, right knee: Secondary | ICD-10-CM | POA: Diagnosis not present

## 2022-07-10 DIAGNOSIS — R293 Abnormal posture: Secondary | ICD-10-CM | POA: Diagnosis not present

## 2022-07-10 DIAGNOSIS — G8194 Hemiplegia, unspecified affecting left nondominant side: Secondary | ICD-10-CM | POA: Diagnosis not present

## 2022-07-10 DIAGNOSIS — M6281 Muscle weakness (generalized): Secondary | ICD-10-CM | POA: Diagnosis not present

## 2022-07-10 DIAGNOSIS — M24562 Contracture, left knee: Secondary | ICD-10-CM | POA: Diagnosis not present

## 2022-07-11 DIAGNOSIS — M6281 Muscle weakness (generalized): Secondary | ICD-10-CM | POA: Diagnosis not present

## 2022-07-11 DIAGNOSIS — M24562 Contracture, left knee: Secondary | ICD-10-CM | POA: Diagnosis not present

## 2022-07-11 DIAGNOSIS — M24561 Contracture, right knee: Secondary | ICD-10-CM | POA: Diagnosis not present

## 2022-07-11 DIAGNOSIS — G8194 Hemiplegia, unspecified affecting left nondominant side: Secondary | ICD-10-CM | POA: Diagnosis not present

## 2022-07-11 DIAGNOSIS — R293 Abnormal posture: Secondary | ICD-10-CM | POA: Diagnosis not present

## 2022-07-12 DIAGNOSIS — K5790 Diverticulosis of intestine, part unspecified, without perforation or abscess without bleeding: Secondary | ICD-10-CM | POA: Diagnosis not present

## 2022-07-12 DIAGNOSIS — M24562 Contracture, left knee: Secondary | ICD-10-CM | POA: Diagnosis not present

## 2022-07-12 DIAGNOSIS — I69354 Hemiplegia and hemiparesis following cerebral infarction affecting left non-dominant side: Secondary | ICD-10-CM | POA: Diagnosis not present

## 2022-07-12 DIAGNOSIS — M24561 Contracture, right knee: Secondary | ICD-10-CM | POA: Diagnosis not present

## 2022-07-12 DIAGNOSIS — I10 Essential (primary) hypertension: Secondary | ICD-10-CM | POA: Diagnosis not present

## 2022-07-12 DIAGNOSIS — M6281 Muscle weakness (generalized): Secondary | ICD-10-CM | POA: Diagnosis not present

## 2022-07-12 DIAGNOSIS — F259 Schizoaffective disorder, unspecified: Secondary | ICD-10-CM | POA: Diagnosis not present

## 2022-07-12 DIAGNOSIS — R293 Abnormal posture: Secondary | ICD-10-CM | POA: Diagnosis not present

## 2022-07-12 DIAGNOSIS — H269 Unspecified cataract: Secondary | ICD-10-CM | POA: Diagnosis not present

## 2022-07-12 DIAGNOSIS — Z9181 History of falling: Secondary | ICD-10-CM | POA: Diagnosis not present

## 2022-07-12 DIAGNOSIS — K623 Rectal prolapse: Secondary | ICD-10-CM | POA: Diagnosis not present

## 2022-07-12 DIAGNOSIS — K279 Peptic ulcer, site unspecified, unspecified as acute or chronic, without hemorrhage or perforation: Secondary | ICD-10-CM | POA: Diagnosis not present

## 2022-07-13 DIAGNOSIS — M24562 Contracture, left knee: Secondary | ICD-10-CM | POA: Diagnosis not present

## 2022-07-13 DIAGNOSIS — M6281 Muscle weakness (generalized): Secondary | ICD-10-CM | POA: Diagnosis not present

## 2022-07-13 DIAGNOSIS — R293 Abnormal posture: Secondary | ICD-10-CM | POA: Diagnosis not present

## 2022-07-13 DIAGNOSIS — M24561 Contracture, right knee: Secondary | ICD-10-CM | POA: Diagnosis not present

## 2022-07-14 DIAGNOSIS — R293 Abnormal posture: Secondary | ICD-10-CM | POA: Diagnosis not present

## 2022-07-14 DIAGNOSIS — M24562 Contracture, left knee: Secondary | ICD-10-CM | POA: Diagnosis not present

## 2022-07-14 DIAGNOSIS — M24561 Contracture, right knee: Secondary | ICD-10-CM | POA: Diagnosis not present

## 2022-07-14 DIAGNOSIS — M6281 Muscle weakness (generalized): Secondary | ICD-10-CM | POA: Diagnosis not present

## 2022-07-17 DIAGNOSIS — R293 Abnormal posture: Secondary | ICD-10-CM | POA: Diagnosis not present

## 2022-07-17 DIAGNOSIS — M6281 Muscle weakness (generalized): Secondary | ICD-10-CM | POA: Diagnosis not present

## 2022-07-17 DIAGNOSIS — M24561 Contracture, right knee: Secondary | ICD-10-CM | POA: Diagnosis not present

## 2022-07-17 DIAGNOSIS — M24562 Contracture, left knee: Secondary | ICD-10-CM | POA: Diagnosis not present

## 2022-07-18 DIAGNOSIS — M24562 Contracture, left knee: Secondary | ICD-10-CM | POA: Diagnosis not present

## 2022-07-18 DIAGNOSIS — M24561 Contracture, right knee: Secondary | ICD-10-CM | POA: Diagnosis not present

## 2022-07-18 DIAGNOSIS — M6281 Muscle weakness (generalized): Secondary | ICD-10-CM | POA: Diagnosis not present

## 2022-07-18 DIAGNOSIS — R293 Abnormal posture: Secondary | ICD-10-CM | POA: Diagnosis not present

## 2022-07-19 DIAGNOSIS — M6281 Muscle weakness (generalized): Secondary | ICD-10-CM | POA: Diagnosis not present

## 2022-07-19 DIAGNOSIS — R293 Abnormal posture: Secondary | ICD-10-CM | POA: Diagnosis not present

## 2022-07-19 DIAGNOSIS — M24561 Contracture, right knee: Secondary | ICD-10-CM | POA: Diagnosis not present

## 2022-07-19 DIAGNOSIS — M24562 Contracture, left knee: Secondary | ICD-10-CM | POA: Diagnosis not present

## 2022-07-20 DIAGNOSIS — M24561 Contracture, right knee: Secondary | ICD-10-CM | POA: Diagnosis not present

## 2022-07-20 DIAGNOSIS — M6281 Muscle weakness (generalized): Secondary | ICD-10-CM | POA: Diagnosis not present

## 2022-07-20 DIAGNOSIS — M24562 Contracture, left knee: Secondary | ICD-10-CM | POA: Diagnosis not present

## 2022-07-20 DIAGNOSIS — R293 Abnormal posture: Secondary | ICD-10-CM | POA: Diagnosis not present

## 2022-07-21 DIAGNOSIS — M24562 Contracture, left knee: Secondary | ICD-10-CM | POA: Diagnosis not present

## 2022-07-21 DIAGNOSIS — M24561 Contracture, right knee: Secondary | ICD-10-CM | POA: Diagnosis not present

## 2022-07-21 DIAGNOSIS — M6281 Muscle weakness (generalized): Secondary | ICD-10-CM | POA: Diagnosis not present

## 2022-07-21 DIAGNOSIS — R293 Abnormal posture: Secondary | ICD-10-CM | POA: Diagnosis not present

## 2022-07-24 DIAGNOSIS — M24562 Contracture, left knee: Secondary | ICD-10-CM | POA: Diagnosis not present

## 2022-07-24 DIAGNOSIS — M6281 Muscle weakness (generalized): Secondary | ICD-10-CM | POA: Diagnosis not present

## 2022-07-24 DIAGNOSIS — R293 Abnormal posture: Secondary | ICD-10-CM | POA: Diagnosis not present

## 2022-07-24 DIAGNOSIS — M24561 Contracture, right knee: Secondary | ICD-10-CM | POA: Diagnosis not present

## 2022-07-25 DIAGNOSIS — M6281 Muscle weakness (generalized): Secondary | ICD-10-CM | POA: Diagnosis not present

## 2022-07-25 DIAGNOSIS — M24561 Contracture, right knee: Secondary | ICD-10-CM | POA: Diagnosis not present

## 2022-07-25 DIAGNOSIS — R293 Abnormal posture: Secondary | ICD-10-CM | POA: Diagnosis not present

## 2022-07-25 DIAGNOSIS — M24562 Contracture, left knee: Secondary | ICD-10-CM | POA: Diagnosis not present

## 2022-07-26 DIAGNOSIS — H119 Unspecified disorder of conjunctiva: Secondary | ICD-10-CM | POA: Diagnosis not present

## 2022-07-26 DIAGNOSIS — M6281 Muscle weakness (generalized): Secondary | ICD-10-CM | POA: Diagnosis not present

## 2022-07-26 DIAGNOSIS — M24562 Contracture, left knee: Secondary | ICD-10-CM | POA: Diagnosis not present

## 2022-07-26 DIAGNOSIS — H04129 Dry eye syndrome of unspecified lacrimal gland: Secondary | ICD-10-CM | POA: Diagnosis not present

## 2022-07-26 DIAGNOSIS — M24561 Contracture, right knee: Secondary | ICD-10-CM | POA: Diagnosis not present

## 2022-07-26 DIAGNOSIS — R293 Abnormal posture: Secondary | ICD-10-CM | POA: Diagnosis not present

## 2022-07-27 DIAGNOSIS — M24562 Contracture, left knee: Secondary | ICD-10-CM | POA: Diagnosis not present

## 2022-07-27 DIAGNOSIS — M24561 Contracture, right knee: Secondary | ICD-10-CM | POA: Diagnosis not present

## 2022-07-27 DIAGNOSIS — R293 Abnormal posture: Secondary | ICD-10-CM | POA: Diagnosis not present

## 2022-07-27 DIAGNOSIS — M6281 Muscle weakness (generalized): Secondary | ICD-10-CM | POA: Diagnosis not present

## 2022-07-28 DIAGNOSIS — R293 Abnormal posture: Secondary | ICD-10-CM | POA: Diagnosis not present

## 2022-07-28 DIAGNOSIS — M24562 Contracture, left knee: Secondary | ICD-10-CM | POA: Diagnosis not present

## 2022-07-28 DIAGNOSIS — M24561 Contracture, right knee: Secondary | ICD-10-CM | POA: Diagnosis not present

## 2022-07-28 DIAGNOSIS — R509 Fever, unspecified: Secondary | ICD-10-CM | POA: Diagnosis not present

## 2022-07-28 DIAGNOSIS — M6281 Muscle weakness (generalized): Secondary | ICD-10-CM | POA: Diagnosis not present

## 2022-07-31 DIAGNOSIS — F22 Delusional disorders: Secondary | ICD-10-CM | POA: Diagnosis not present

## 2022-07-31 DIAGNOSIS — H04129 Dry eye syndrome of unspecified lacrimal gland: Secondary | ICD-10-CM | POA: Diagnosis not present

## 2022-07-31 DIAGNOSIS — H119 Unspecified disorder of conjunctiva: Secondary | ICD-10-CM | POA: Diagnosis not present

## 2022-07-31 DIAGNOSIS — M6281 Muscle weakness (generalized): Secondary | ICD-10-CM | POA: Diagnosis not present

## 2022-07-31 DIAGNOSIS — R293 Abnormal posture: Secondary | ICD-10-CM | POA: Diagnosis not present

## 2022-07-31 DIAGNOSIS — M24562 Contracture, left knee: Secondary | ICD-10-CM | POA: Diagnosis not present

## 2022-07-31 DIAGNOSIS — M24561 Contracture, right knee: Secondary | ICD-10-CM | POA: Diagnosis not present

## 2022-07-31 DIAGNOSIS — Z993 Dependence on wheelchair: Secondary | ICD-10-CM | POA: Diagnosis not present

## 2022-07-31 DIAGNOSIS — R4189 Other symptoms and signs involving cognitive functions and awareness: Secondary | ICD-10-CM | POA: Diagnosis not present

## 2022-08-02 DIAGNOSIS — M24562 Contracture, left knee: Secondary | ICD-10-CM | POA: Diagnosis not present

## 2022-08-02 DIAGNOSIS — R293 Abnormal posture: Secondary | ICD-10-CM | POA: Diagnosis not present

## 2022-08-02 DIAGNOSIS — M24561 Contracture, right knee: Secondary | ICD-10-CM | POA: Diagnosis not present

## 2022-08-02 DIAGNOSIS — M6281 Muscle weakness (generalized): Secondary | ICD-10-CM | POA: Diagnosis not present

## 2022-08-03 DIAGNOSIS — M24562 Contracture, left knee: Secondary | ICD-10-CM | POA: Diagnosis not present

## 2022-08-03 DIAGNOSIS — R293 Abnormal posture: Secondary | ICD-10-CM | POA: Diagnosis not present

## 2022-08-03 DIAGNOSIS — M24561 Contracture, right knee: Secondary | ICD-10-CM | POA: Diagnosis not present

## 2022-08-03 DIAGNOSIS — M6281 Muscle weakness (generalized): Secondary | ICD-10-CM | POA: Diagnosis not present

## 2022-08-07 DIAGNOSIS — R293 Abnormal posture: Secondary | ICD-10-CM | POA: Diagnosis not present

## 2022-08-07 DIAGNOSIS — M24562 Contracture, left knee: Secondary | ICD-10-CM | POA: Diagnosis not present

## 2022-08-07 DIAGNOSIS — M24561 Contracture, right knee: Secondary | ICD-10-CM | POA: Diagnosis not present

## 2022-08-07 DIAGNOSIS — M6281 Muscle weakness (generalized): Secondary | ICD-10-CM | POA: Diagnosis not present

## 2022-08-09 DIAGNOSIS — M24562 Contracture, left knee: Secondary | ICD-10-CM | POA: Diagnosis not present

## 2022-08-09 DIAGNOSIS — R293 Abnormal posture: Secondary | ICD-10-CM | POA: Diagnosis not present

## 2022-08-09 DIAGNOSIS — M6281 Muscle weakness (generalized): Secondary | ICD-10-CM | POA: Diagnosis not present

## 2022-08-09 DIAGNOSIS — M24561 Contracture, right knee: Secondary | ICD-10-CM | POA: Diagnosis not present

## 2022-08-10 DIAGNOSIS — M6281 Muscle weakness (generalized): Secondary | ICD-10-CM | POA: Diagnosis not present

## 2022-08-10 DIAGNOSIS — R293 Abnormal posture: Secondary | ICD-10-CM | POA: Diagnosis not present

## 2022-08-10 DIAGNOSIS — M24561 Contracture, right knee: Secondary | ICD-10-CM | POA: Diagnosis not present

## 2022-08-10 DIAGNOSIS — M24562 Contracture, left knee: Secondary | ICD-10-CM | POA: Diagnosis not present

## 2022-08-11 DIAGNOSIS — M24562 Contracture, left knee: Secondary | ICD-10-CM | POA: Diagnosis not present

## 2022-08-11 DIAGNOSIS — M24561 Contracture, right knee: Secondary | ICD-10-CM | POA: Diagnosis not present

## 2022-08-11 DIAGNOSIS — R293 Abnormal posture: Secondary | ICD-10-CM | POA: Diagnosis not present

## 2022-08-11 DIAGNOSIS — M6281 Muscle weakness (generalized): Secondary | ICD-10-CM | POA: Diagnosis not present

## 2022-08-16 DIAGNOSIS — M24561 Contracture, right knee: Secondary | ICD-10-CM | POA: Diagnosis not present

## 2022-08-16 DIAGNOSIS — R278 Other lack of coordination: Secondary | ICD-10-CM | POA: Diagnosis not present

## 2022-08-16 DIAGNOSIS — M24562 Contracture, left knee: Secondary | ICD-10-CM | POA: Diagnosis not present

## 2022-08-16 DIAGNOSIS — M6281 Muscle weakness (generalized): Secondary | ICD-10-CM | POA: Diagnosis not present

## 2022-08-16 DIAGNOSIS — R293 Abnormal posture: Secondary | ICD-10-CM | POA: Diagnosis not present

## 2022-08-18 DIAGNOSIS — M24561 Contracture, right knee: Secondary | ICD-10-CM | POA: Diagnosis not present

## 2022-08-18 DIAGNOSIS — R293 Abnormal posture: Secondary | ICD-10-CM | POA: Diagnosis not present

## 2022-08-18 DIAGNOSIS — M24562 Contracture, left knee: Secondary | ICD-10-CM | POA: Diagnosis not present

## 2022-08-18 DIAGNOSIS — M6281 Muscle weakness (generalized): Secondary | ICD-10-CM | POA: Diagnosis not present

## 2022-08-18 DIAGNOSIS — R278 Other lack of coordination: Secondary | ICD-10-CM | POA: Diagnosis not present

## 2022-08-21 DIAGNOSIS — F3130 Bipolar disorder, current episode depressed, mild or moderate severity, unspecified: Secondary | ICD-10-CM | POA: Diagnosis not present

## 2022-08-21 DIAGNOSIS — F200 Paranoid schizophrenia: Secondary | ICD-10-CM | POA: Diagnosis not present

## 2022-08-22 DIAGNOSIS — M24561 Contracture, right knee: Secondary | ICD-10-CM | POA: Diagnosis not present

## 2022-08-22 DIAGNOSIS — M6281 Muscle weakness (generalized): Secondary | ICD-10-CM | POA: Diagnosis not present

## 2022-08-22 DIAGNOSIS — M24562 Contracture, left knee: Secondary | ICD-10-CM | POA: Diagnosis not present

## 2022-08-22 DIAGNOSIS — R293 Abnormal posture: Secondary | ICD-10-CM | POA: Diagnosis not present

## 2022-08-22 DIAGNOSIS — R278 Other lack of coordination: Secondary | ICD-10-CM | POA: Diagnosis not present

## 2022-09-01 DIAGNOSIS — I10 Essential (primary) hypertension: Secondary | ICD-10-CM | POA: Diagnosis not present

## 2022-09-04 DIAGNOSIS — M24561 Contracture, right knee: Secondary | ICD-10-CM | POA: Diagnosis not present

## 2022-09-04 DIAGNOSIS — R293 Abnormal posture: Secondary | ICD-10-CM | POA: Diagnosis not present

## 2022-09-04 DIAGNOSIS — M6281 Muscle weakness (generalized): Secondary | ICD-10-CM | POA: Diagnosis not present

## 2022-09-04 DIAGNOSIS — R278 Other lack of coordination: Secondary | ICD-10-CM | POA: Diagnosis not present

## 2022-09-04 DIAGNOSIS — M24562 Contracture, left knee: Secondary | ICD-10-CM | POA: Diagnosis not present

## 2022-09-05 DIAGNOSIS — M24561 Contracture, right knee: Secondary | ICD-10-CM | POA: Diagnosis not present

## 2022-09-05 DIAGNOSIS — M6281 Muscle weakness (generalized): Secondary | ICD-10-CM | POA: Diagnosis not present

## 2022-09-05 DIAGNOSIS — M24562 Contracture, left knee: Secondary | ICD-10-CM | POA: Diagnosis not present

## 2022-09-05 DIAGNOSIS — R293 Abnormal posture: Secondary | ICD-10-CM | POA: Diagnosis not present

## 2022-09-05 DIAGNOSIS — R278 Other lack of coordination: Secondary | ICD-10-CM | POA: Diagnosis not present

## 2022-09-06 DIAGNOSIS — M24561 Contracture, right knee: Secondary | ICD-10-CM | POA: Diagnosis not present

## 2022-09-06 DIAGNOSIS — R293 Abnormal posture: Secondary | ICD-10-CM | POA: Diagnosis not present

## 2022-09-06 DIAGNOSIS — M6281 Muscle weakness (generalized): Secondary | ICD-10-CM | POA: Diagnosis not present

## 2022-09-06 DIAGNOSIS — M24562 Contracture, left knee: Secondary | ICD-10-CM | POA: Diagnosis not present

## 2022-09-06 DIAGNOSIS — R278 Other lack of coordination: Secondary | ICD-10-CM | POA: Diagnosis not present

## 2022-09-07 DIAGNOSIS — M24561 Contracture, right knee: Secondary | ICD-10-CM | POA: Diagnosis not present

## 2022-09-07 DIAGNOSIS — M6281 Muscle weakness (generalized): Secondary | ICD-10-CM | POA: Diagnosis not present

## 2022-09-07 DIAGNOSIS — M24562 Contracture, left knee: Secondary | ICD-10-CM | POA: Diagnosis not present

## 2022-09-07 DIAGNOSIS — R293 Abnormal posture: Secondary | ICD-10-CM | POA: Diagnosis not present

## 2022-09-07 DIAGNOSIS — R278 Other lack of coordination: Secondary | ICD-10-CM | POA: Diagnosis not present

## 2022-09-08 DIAGNOSIS — M24561 Contracture, right knee: Secondary | ICD-10-CM | POA: Diagnosis not present

## 2022-09-08 DIAGNOSIS — R293 Abnormal posture: Secondary | ICD-10-CM | POA: Diagnosis not present

## 2022-09-08 DIAGNOSIS — M6281 Muscle weakness (generalized): Secondary | ICD-10-CM | POA: Diagnosis not present

## 2022-09-08 DIAGNOSIS — R278 Other lack of coordination: Secondary | ICD-10-CM | POA: Diagnosis not present

## 2022-09-08 DIAGNOSIS — M24562 Contracture, left knee: Secondary | ICD-10-CM | POA: Diagnosis not present

## 2022-09-22 ENCOUNTER — Telehealth: Payer: Self-pay

## 2022-09-22 NOTE — Telephone Encounter (Signed)
Called to check the status of referral sent to [facility] by Hyacinth Meeker on 07-10-22.  Spoke with referral team at [facility] who stated that patient was scheduled for consult on 07-31-22, but the appointment had been cancelled by patients daughter.  Daughter stated that they would call back to reschedule.

## 2022-12-10 IMAGING — DX DG KNEE COMPLETE 4+V*R*
3 series · 3 of 3 positions shown · non-contrast
Comparison: None.

CLINICAL DATA: Right knee dislocation.

EXAM:
RIGHT KNEE - COMPLETE 4+ VIEW

[knee ap]
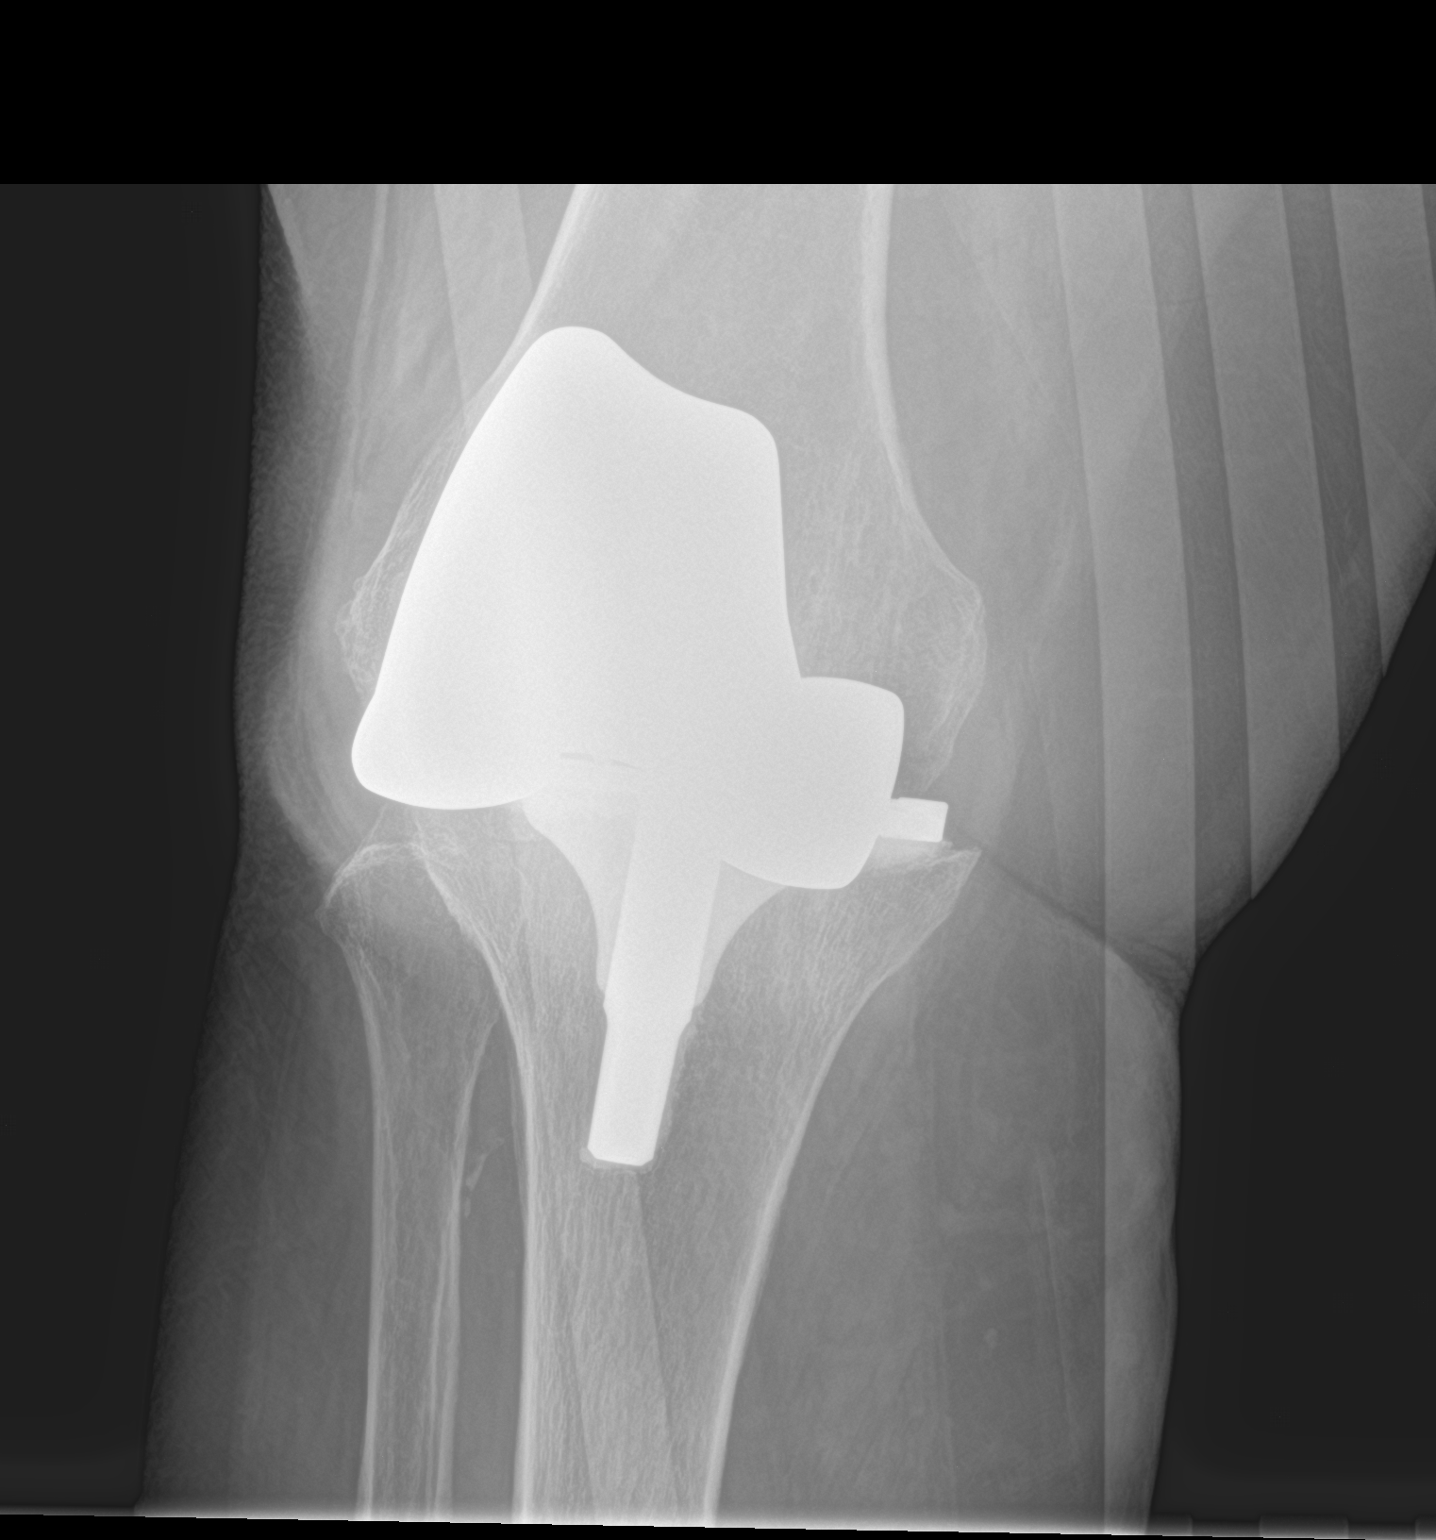

[knee lat]
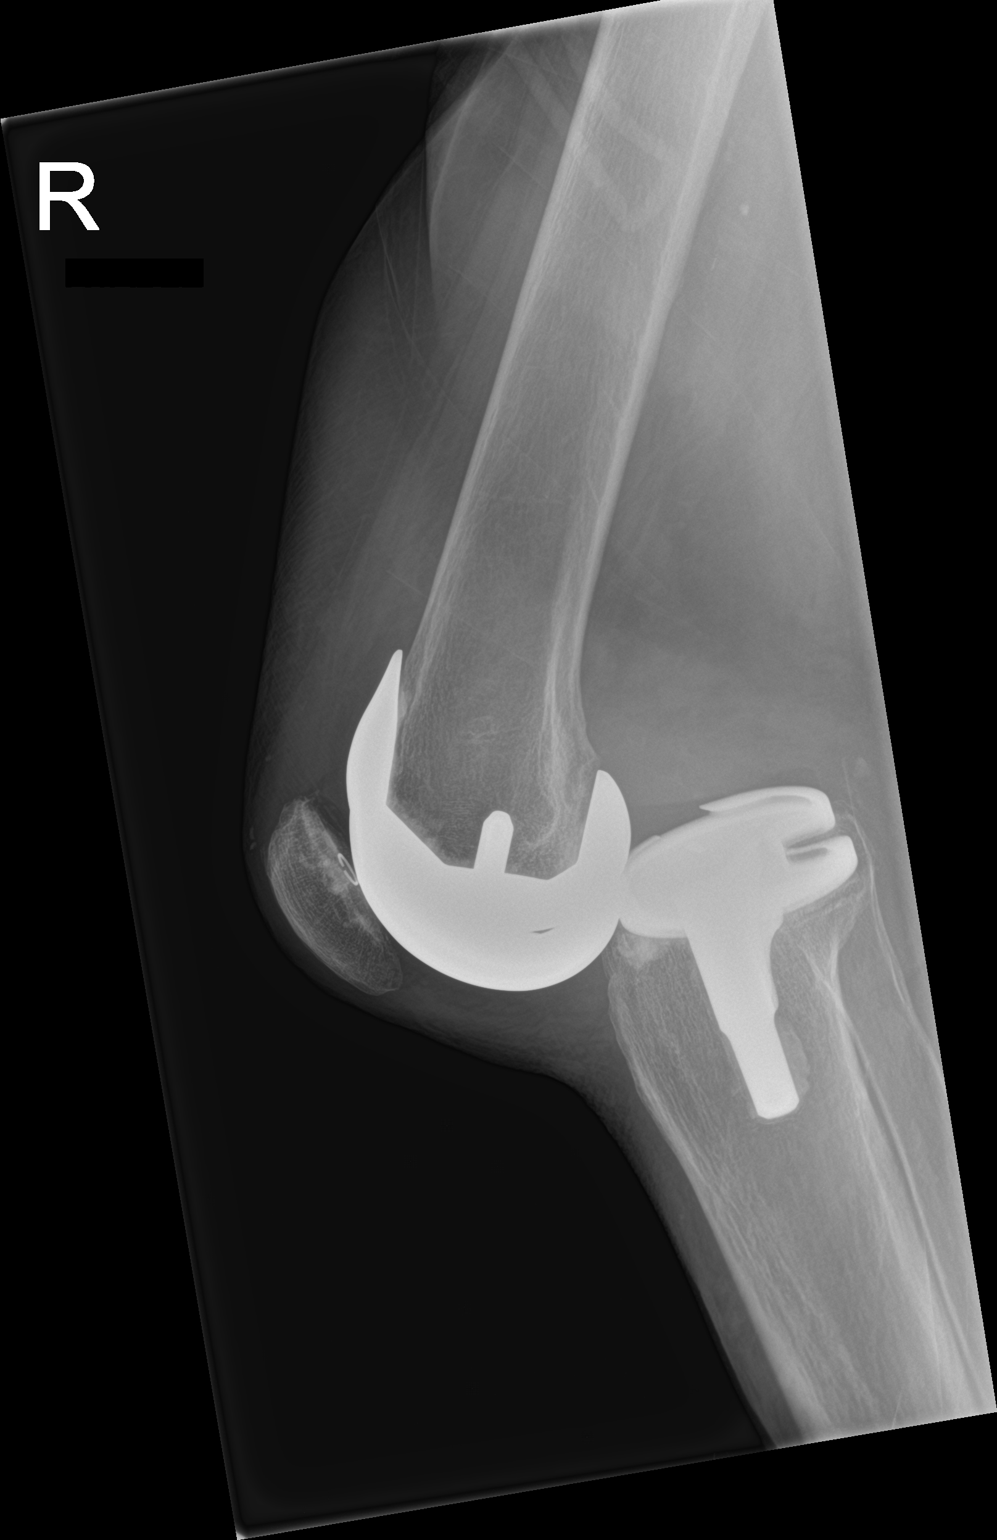

[knee obl]
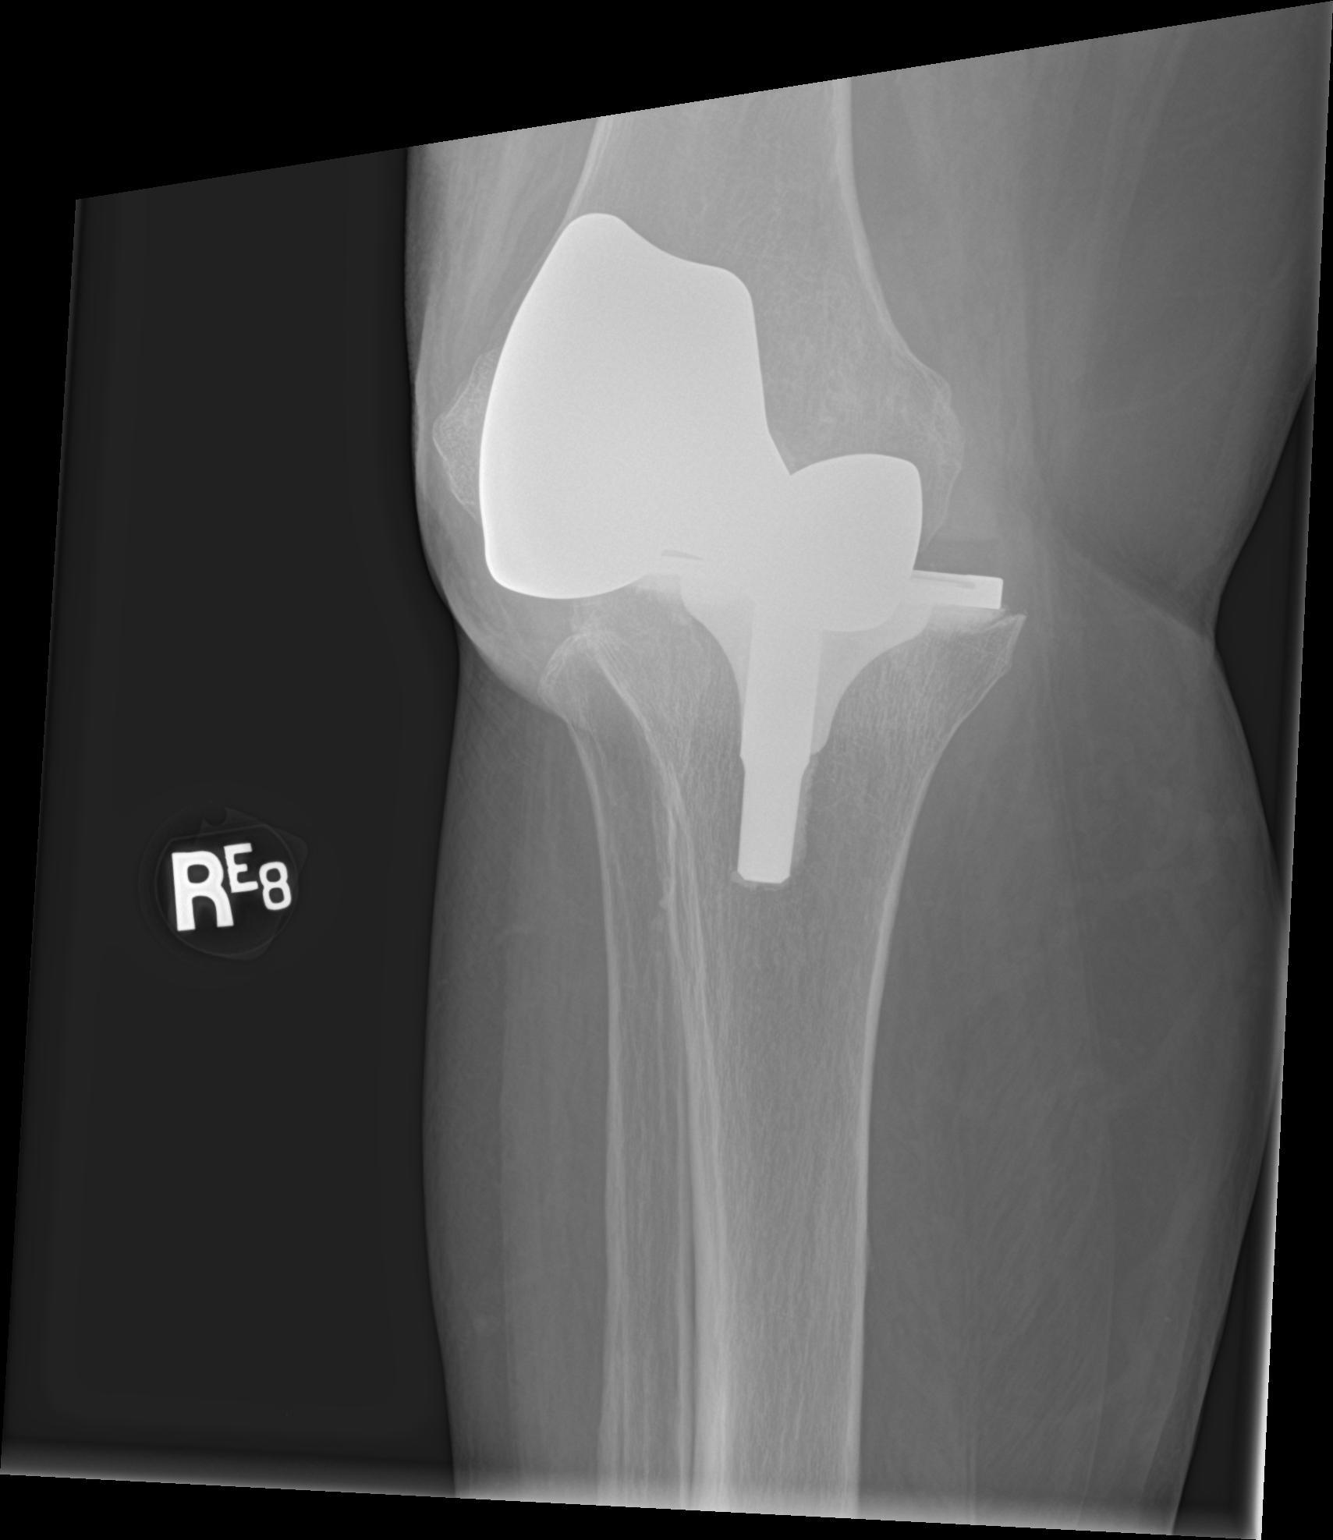

[3 of 3 positions shown; findings below may reference images not displayed]

FINDINGS: Three-view exam shows anterior dislocation of the femur relative to
the proximal tibia in this patient status post tricompartmental knee
replacement. No associated periprosthetic fracture evident on the
provided images. Bones are diffusely demineralized.
IMPRESSION: Anterior dislocation of the femur relative to the proximal tibia in
this patient status post tricompartmental knee replacement.

## 2022-12-10 IMAGING — DX DG PORTABLE PELVIS
2 series · 2 of 2 positions shown · non-contrast
Comparison: None.

CLINICAL DATA: Fall, knee arthroplasty dislocation

EXAM:
PORTABLE PELVIS 1-2 VIEWS

[pelvis ap (1 of 2)]
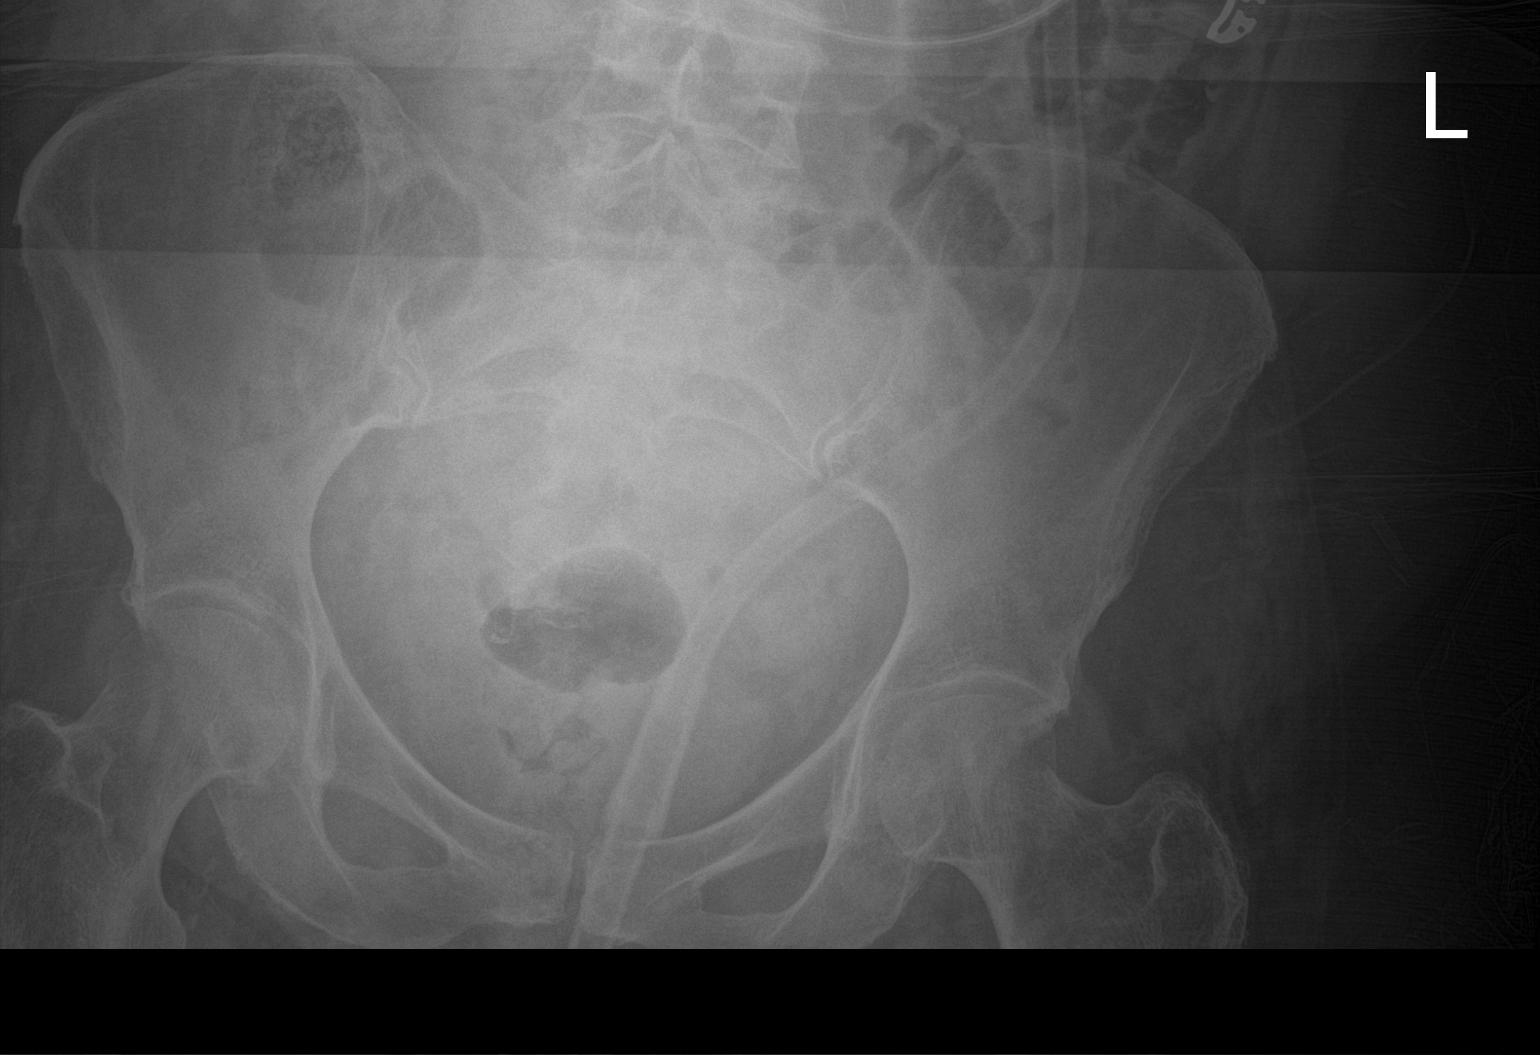

[pelvis ap (2 of 2)]
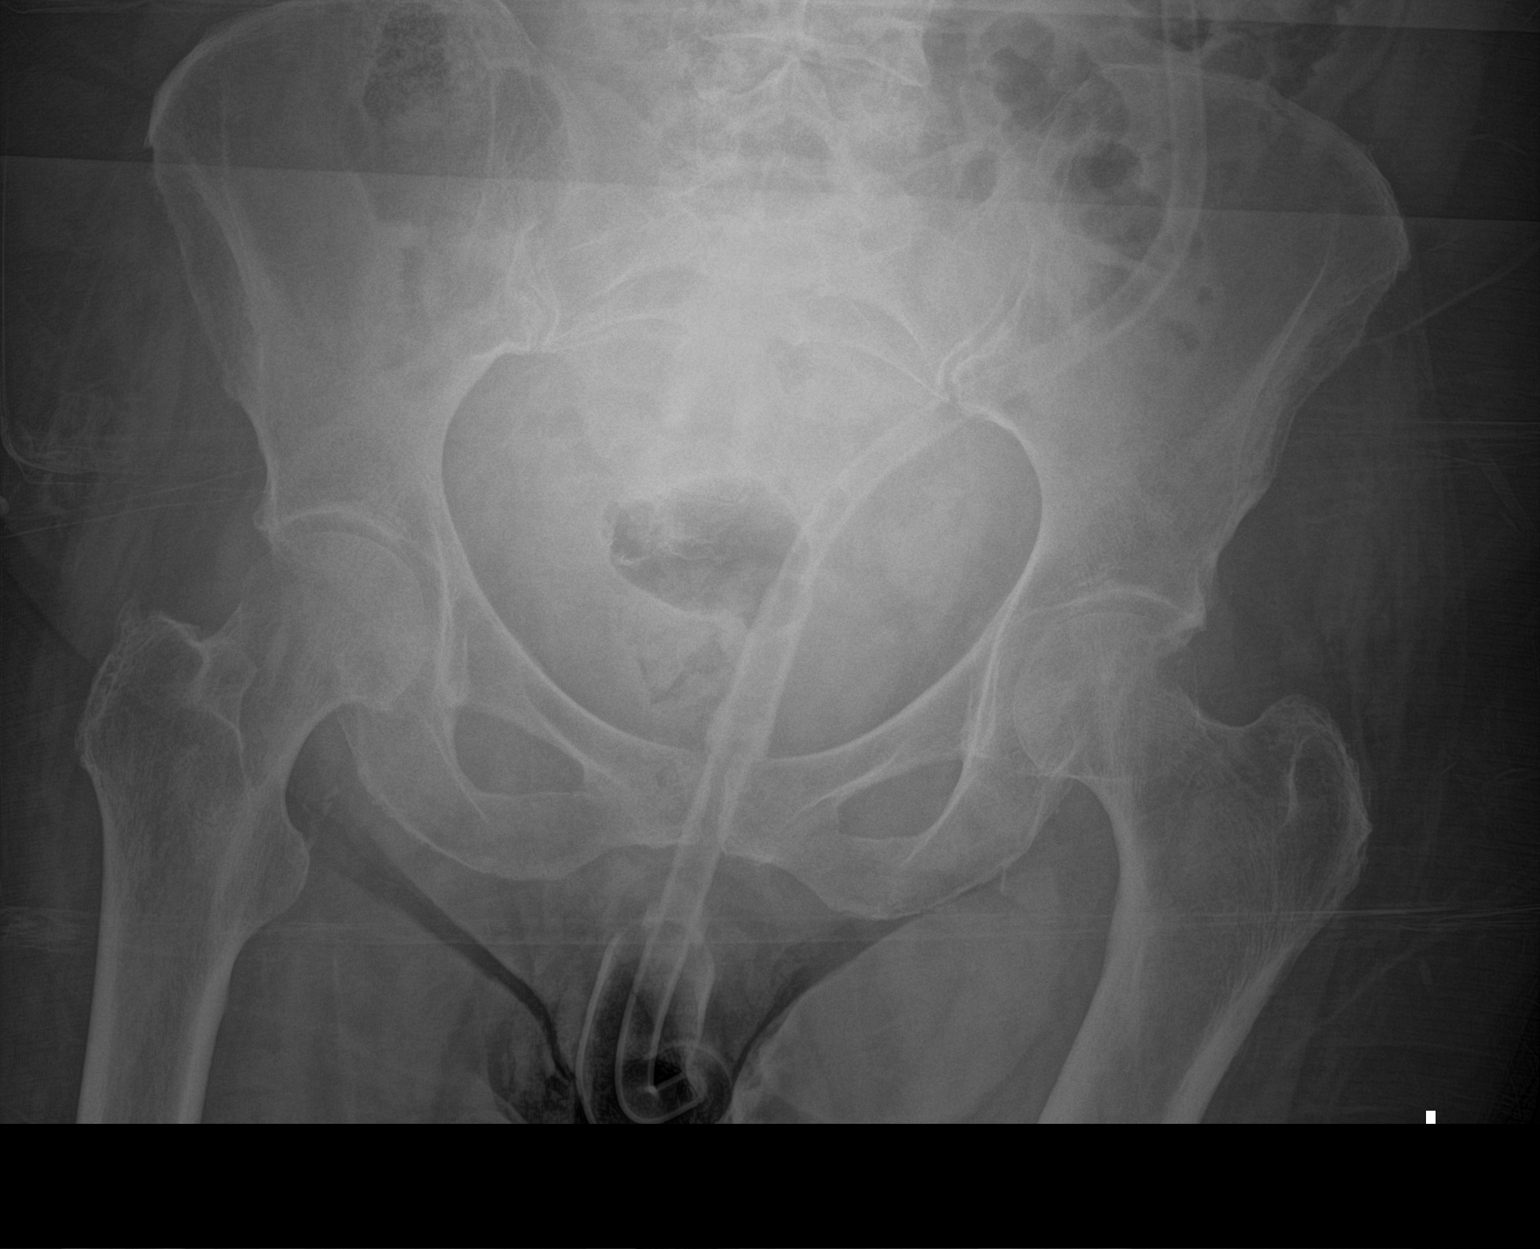

[2 of 2 positions shown; findings below may reference images not displayed]

FINDINGS: Osteopenia. There is no evidence of displaced pelvic fracture or
diastasis. No pelvic bone lesions are seen.
IMPRESSION: Osteopenia. No displaced or dislocation of the bilateral hips and
pelvis seen in frontal view only. Please note that plain radiographs
are insensitive for hip and pelvic fracture.

## 2022-12-10 IMAGING — CT CT KNEE*R* W/O CM
4 of 6 series · 15 of 33 positions shown, 17 images · non-contrast
Comparison: Plain radiographs completed at [DATE] a.m.

CLINICAL DATA: Fracture, knee s/p fall, per ortho.

EXAM:
CT OF THE RIGHT KNEE WITHOUT CONTRAST
TECHNIQUE: Multidetector CT imaging of the RIGHT knee was performed according
to the standard protocol. Multiplanar CT image reconstructions were
also generated.

[Series 4: extremity soft tissue (person_name) · axial · 0.47mm/px · z∈[+510,+660]mm · 5 of 113 slices shown, 7 images (1 of 2)]
[im 19/113  soft-tissue]
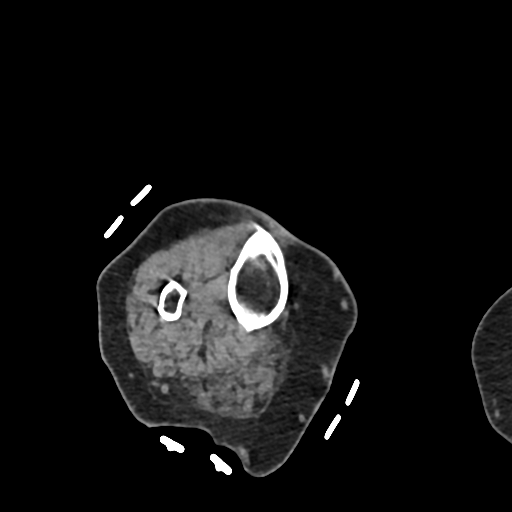
[im 19/113  bone]
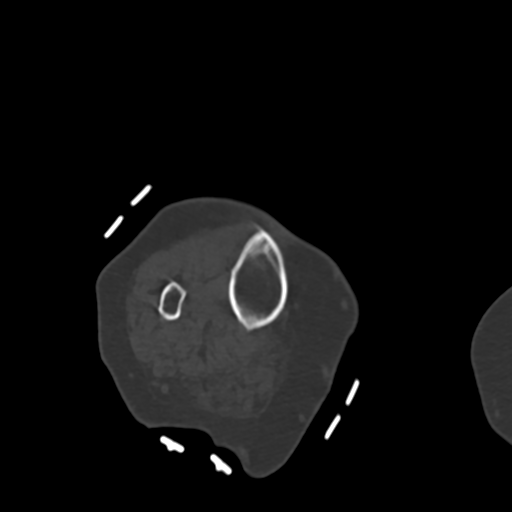
[im 38/113  bone]
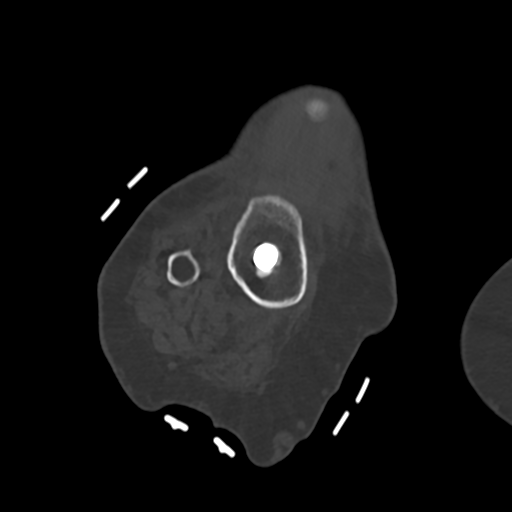
[im 57/113  bone]
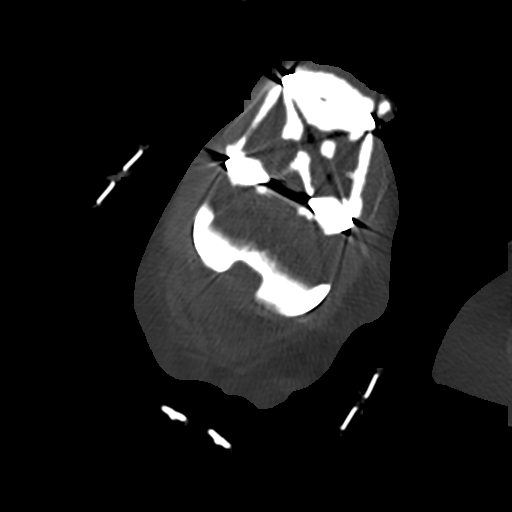
[im 75/113  bone]
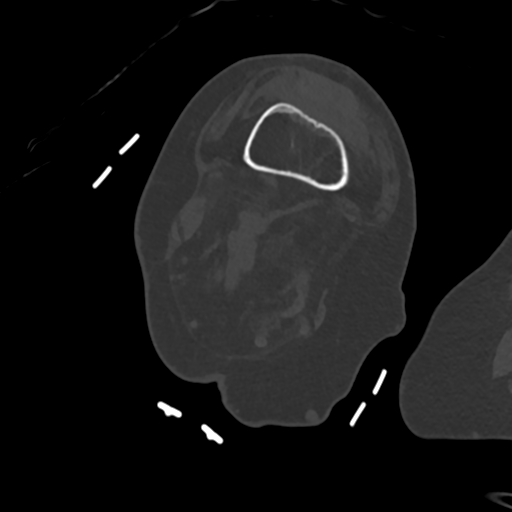
[im 94/113  soft-tissue]
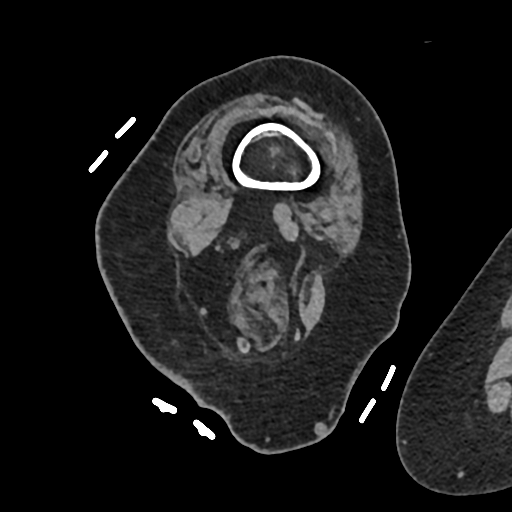
[im 94/113  bone]
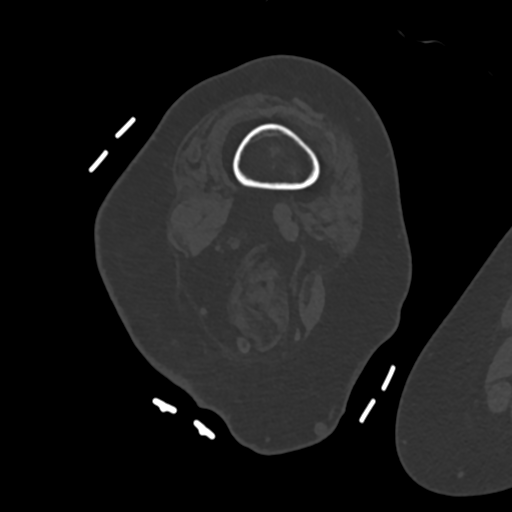

[Series 6: cor bone · coronal · 0.35mm/px · 1 of 97 slices shown]
[im 49/97  bone]
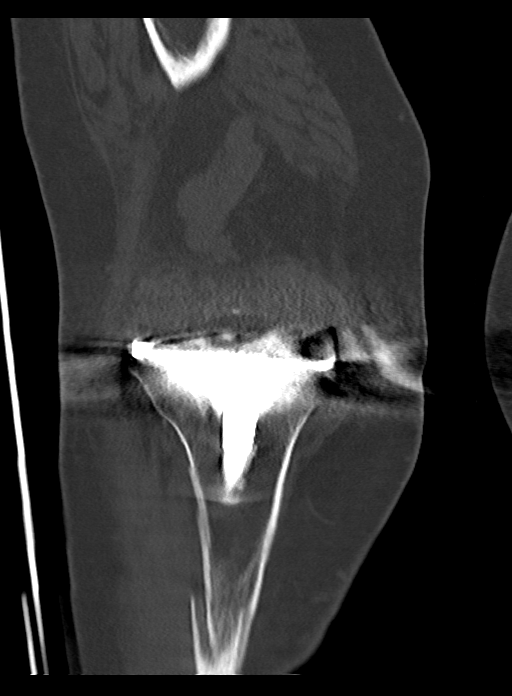

[Series 7: sag bone (person_name) · sagittal · 0.40mm/px · 4 of 86 slices shown]
[im 18/86  bone]
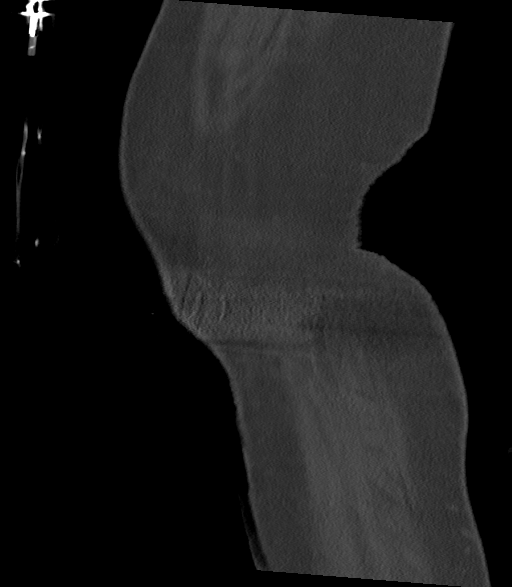
[im 35/86  bone]
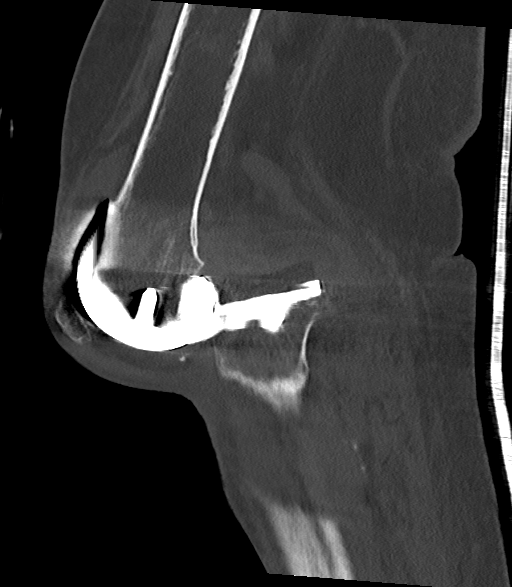
[im 52/86  bone]
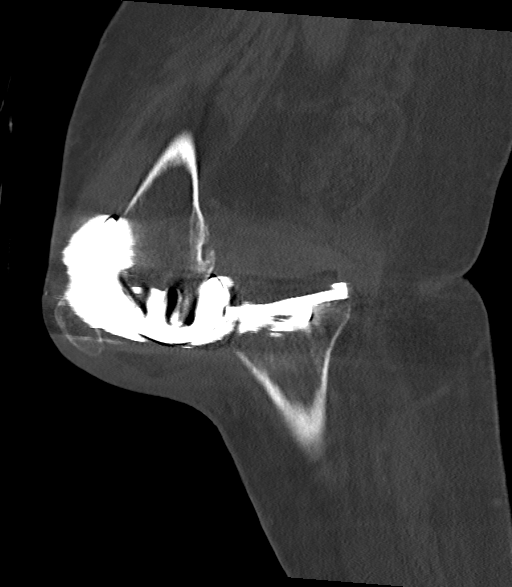
[im 69/86  bone]
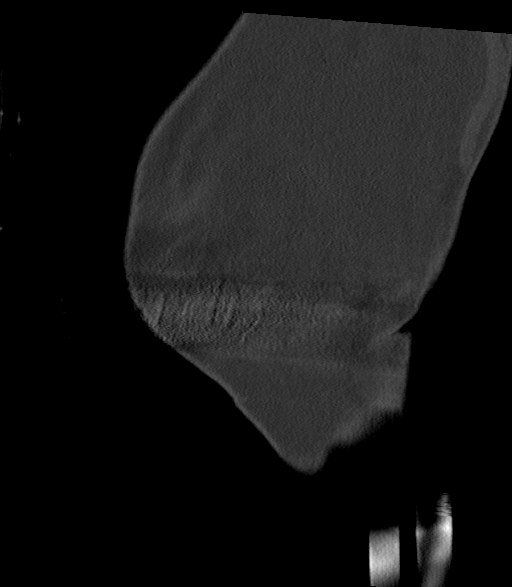

[Series 11: extremity soft tissue (person_name) · axial · 0.38mm/px · z∈[+511,+659]mm · 5 of 112 slices shown (2 of 2)]
[im 19/112  soft-tissue]
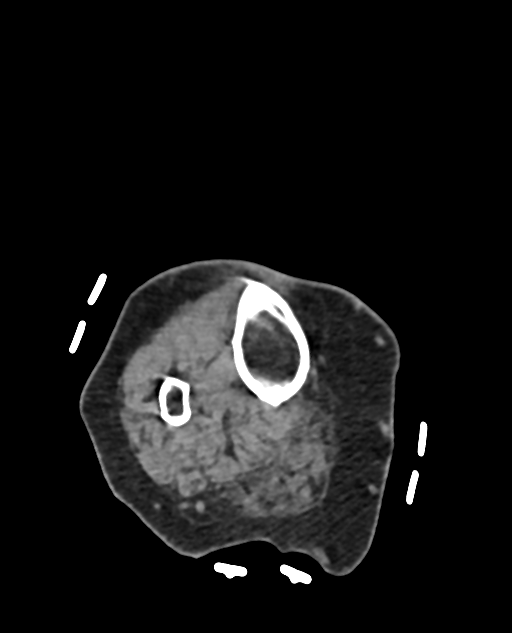
[im 38/112  soft-tissue]
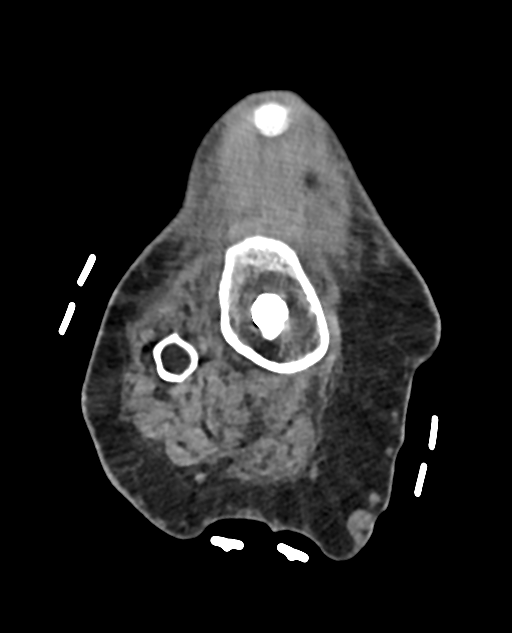
[im 56/112  soft-tissue]
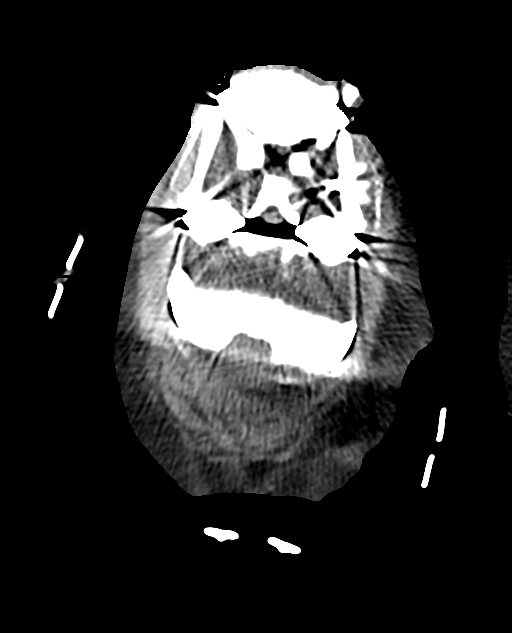
[im 75/112  soft-tissue]
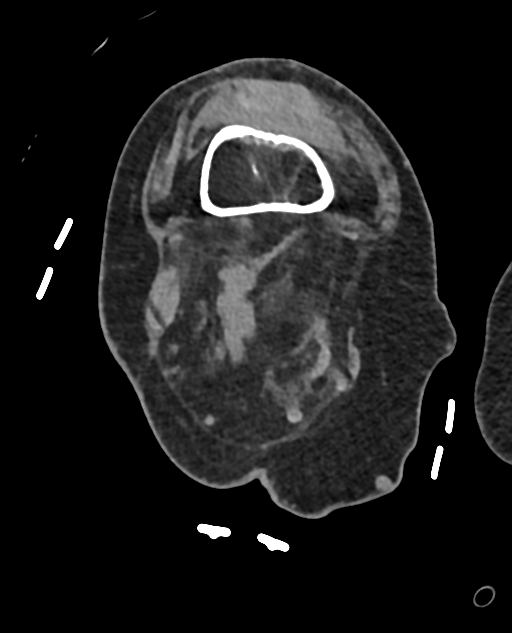
[im 93/112  soft-tissue]
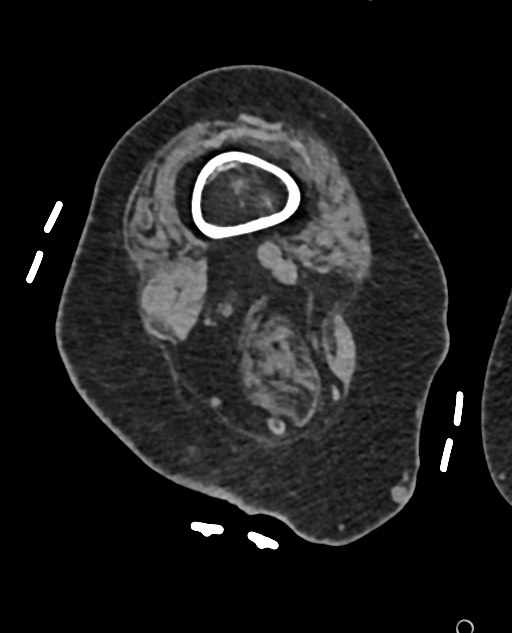

[15 of 33 positions shown; findings below may reference images not displayed]

FINDINGS: Bones/Joint/Cartilage

Right total knee arthroplasty has been performed. Streak artifact
slightly limits the examination. There is posterior dislocation and
mild override of the tibia in relation to the distal femur. No
associated fracture. No lytic lesion.

Ligaments

Suboptimally assessed by CT.

Muscles and Tendons

Extensor mechanism appears intact. Mild fatty atrophy diffusely the
visualized right lower extremity musculature.

Soft tissues

Small right knee effusion. Mild prepatellar soft tissue swelling.
Vascular calcifications noted within the right lower extremity
runoff. Patency of the vasculature is not well assessed on this
noncontrast examination.
IMPRESSION: Status post right total knee arthroplasty. Posterior right knee
dislocation. No associated fracture. Small effusion.

## 2022-12-14 DIAGNOSIS — Z23 Encounter for immunization: Secondary | ICD-10-CM | POA: Diagnosis not present

## 2022-12-14 DIAGNOSIS — Z7185 Encounter for immunization safety counseling: Secondary | ICD-10-CM | POA: Diagnosis not present

## 2024-01-29 ENCOUNTER — Encounter: Payer: Self-pay | Admitting: Oncology

## 2024-01-29 ENCOUNTER — Emergency Department (HOSPITAL_COMMUNITY)

## 2024-01-29 ENCOUNTER — Encounter (HOSPITAL_COMMUNITY): Payer: Self-pay | Admitting: Emergency Medicine

## 2024-01-29 ENCOUNTER — Emergency Department (HOSPITAL_COMMUNITY)
Admission: EM | Admit: 2024-01-29 | Discharge: 2024-01-30 | Disposition: A | Discharge status: Home / Self Care | Attending: Emergency Medicine | Admitting: Emergency Medicine

## 2024-01-29 DIAGNOSIS — N3000 Acute cystitis without hematuria: Secondary | ICD-10-CM | POA: Diagnosis not present

## 2024-01-29 DIAGNOSIS — Z79899 Other long term (current) drug therapy: Secondary | ICD-10-CM | POA: Insufficient documentation

## 2024-01-29 DIAGNOSIS — R0602 Shortness of breath: Secondary | ICD-10-CM | POA: Diagnosis not present

## 2024-01-29 DIAGNOSIS — Z7982 Long term (current) use of aspirin: Secondary | ICD-10-CM | POA: Insufficient documentation

## 2024-01-29 DIAGNOSIS — I10 Essential (primary) hypertension: Secondary | ICD-10-CM | POA: Diagnosis not present

## 2024-01-29 DIAGNOSIS — R55 Syncope and collapse: Secondary | ICD-10-CM | POA: Diagnosis present

## 2024-01-29 LAB — CBC WITH DIFFERENTIAL/PLATELET
Abs Immature Granulocytes: 0.04 K/uL (ref 0.00–0.07)
Basophils Absolute: 0 K/uL (ref 0.0–0.1)
Basophils Relative: 0 %
Eosinophils Absolute: 0.1 K/uL (ref 0.0–0.5)
Eosinophils Relative: 1 %
HCT: 39.4 % (ref 36.0–46.0)
Hemoglobin: 13 g/dL (ref 12.0–15.0)
Immature Granulocytes: 0 %
Lymphocytes Relative: 15 %
Lymphs Abs: 1.5 K/uL (ref 0.7–4.0)
MCH: 30.2 pg (ref 26.0–34.0)
MCHC: 33 g/dL (ref 30.0–36.0)
MCV: 91.4 fL (ref 80.0–100.0)
Monocytes Absolute: 0.8 K/uL (ref 0.1–1.0)
Monocytes Relative: 8 %
Neutro Abs: 7.3 K/uL (ref 1.7–7.7)
Neutrophils Relative %: 76 %
Platelets: 206 K/uL (ref 150–400)
RBC: 4.31 MIL/uL (ref 3.87–5.11)
RDW: 12.5 % (ref 11.5–15.5)
WBC: 9.8 K/uL (ref 4.0–10.5)
nRBC: 0 % (ref 0.0–0.2)

## 2024-01-29 LAB — URINALYSIS, ROUTINE W REFLEX MICROSCOPIC
Bilirubin Urine: NEGATIVE
Glucose, UA: NEGATIVE mg/dL
Hgb urine dipstick: NEGATIVE
Ketones, ur: NEGATIVE mg/dL
Nitrite: POSITIVE — AB
Protein, ur: 30 mg/dL — AB
Specific Gravity, Urine: 1.012 (ref 1.005–1.030)
pH: 6 (ref 5.0–8.0)

## 2024-01-29 LAB — BASIC METABOLIC PANEL WITH GFR
Anion gap: 13 (ref 5–15)
BUN: 20 mg/dL (ref 8–23)
CO2: 24 mmol/L (ref 22–32)
Calcium: 9.9 mg/dL (ref 8.9–10.3)
Chloride: 101 mmol/L (ref 98–111)
Creatinine, Ser: 0.56 mg/dL (ref 0.44–1.00)
GFR, Estimated: >60 mL/min (ref >60)
Glucose, Bld: 106 mg/dL — ABNORMAL HIGH (ref 70–99)
Potassium: 4.1 mmol/L (ref 3.5–5.1)
Sodium: 138 mmol/L (ref 135–145)

## 2024-01-29 LAB — TROPONIN I (HIGH SENSITIVITY)
Troponin I (High Sensitivity): 6 ng/L (ref <18)
Troponin I (High Sensitivity): 9 ng/L (ref <18)

## 2024-01-29 LAB — BRAIN NATRIURETIC PEPTIDE: B Natriuretic Peptide: 202.2 pg/mL — ABNORMAL HIGH (ref 0.0–100.0)

## 2024-01-29 MED ORDER — CEPHALEXIN 500 MG PO CAPS: 500.0000 mg | ORAL_CAPSULE | Freq: Three times a day (TID) | ORAL | 0 refills | Status: AC

## 2024-01-29 MED ORDER — IOHEXOL 350 MG/ML SOLN
75.0000 mL | Freq: Once | INTRAVENOUS | Status: AC | PRN
  Administered 2024-01-29: 75 mL via INTRAVENOUS

## 2024-01-29 MED ORDER — CEPHALEXIN 250 MG PO CAPS
500.0000 mg | ORAL_CAPSULE | Freq: Once | ORAL | Status: AC
  Administered 2024-01-30: 500 mg via ORAL
  Filled 2024-01-29: qty 2

## 2024-01-29 NOTE — ED Provider Notes (Signed)
 [place] EMERGENCY DEPARTMENT AT [facility] Provider Note   CSN: [id number] Arrival date & time: 01/29/24  8090     Patient presents with: Loss of Consciousness   [name] is a 83 y.o. female.  {Add pertinent medical, surgical, social history, OB history to HPI:32947} Patient is an 83 year old female with past medical history of hypertension, hyperlipidemia, schizoaffective disorder, previous CVA with left-sided upper and lower extremity deficits presenting for complaints of possible syncope.  Patient was reported by EMS to have had an episode where she was sitting in her chair and her eyes rolled back and she became unresponsive for several minutes.  She was then reported to awake and was alert and oriented x 4 and back to her baseline.  Currently patient is awake, alert, answering questions appropriately.  She has contractures of the left upper extremity and does not move her left lower extremity which is reported to be chronic from her previous CVA. She denies any confusion, headaches, vision changes, or weakness.  Denies any chest pain or shortness of breath. Has any new illnesses including no fevers, chills, coughing, sore throat, abdominal pain, nausea, vomiting, or dysuria.    The history is provided by the patient. No language interpreter was used.  Loss of Consciousness Associated symptoms: no chest pain, no fever, no palpitations, no seizures, no shortness of breath and no vomiting        Prior to Admission medications   Medication Sig Start Date End Date Taking? Authorizing Provider  acetaminophen  (TYLENOL ) 500 MG tablet Take 1,000 mg by mouth 2 (two) times daily.    [provider]  amLODipine  (NORVASC ) 5 MG tablet Take 5 mg by mouth See admin instructions. Take 5 mg by mouth once a day and hold for a Systolic reading <889    [provider]  Artificial Saliva (BIOTENE DRY MOUTH MOISTURIZING) SOLN Use as directed 1 spray in the mouth or  throat every other day.    [provider]  aspirin  81 MG chewable tablet Chew 1 tablet (81 mg total) by mouth daily. 12/10/20   Rizwan, Saima, MD  carvedilol  (COREG ) 6.25 MG tablet Take 1 tablet (6.25 mg total) by mouth 2 (two) times daily with a meal. Patient taking differently: Take 6.25 mg by mouth 2 (two) times daily as needed (for Systolic reading >859 and/or Diastolic reading >899). 11/25/20   Melvin Pao, NP  diclofenac Sodium (VOLTAREN) 1 % GEL Apply 2 g topically See admin instructions. Apply 2 grams to joints/ knuckles- right hand    [provider]  ferrous sulfate  325 (65 FE) MG tablet Take 1 tablet (325 mg total) by mouth daily. 02/20/22 06/20/22  Arlice Reichert, MD  lidocaine  (LMX) 4 % cream Apply 1 Application topically See admin instructions. Apply to both feet at bedtime    [provider]  Menthol , Topical Analgesic, (BIOFREEZE) 4 % GEL Apply 1 application  topically See admin instructions. Apply to neck and knees 2 times a day    [provider]  methocarbamol (ROBAXIN) 500 MG tablet Take 250-500 mg by mouth See admin instructions. Take 250 mg by mouth in the morning and 500 mg at bedtime    [provider]  Multiple Vitamins-Minerals (DECUBI-VITE PO) Take 1 capsule by mouth daily.    [provider]  Nutritional Supplement LIQD Take 120 mLs by mouth 2 (two) times daily.    [provider]  nystatin (MYCOSTATIN/NYSTOP) powder Apply 1 application  topically See admin  instructions. Apply to buttocks/sacral area 2 times a day after washing/drying the area    [provider]  pantoprazole  (PROTONIX ) 40 MG tablet Take 1 tablet (40 mg total) by mouth 2 (two) times daily before a meal. 02/20/22   Dahal, Chapman, MD  polyethylene glycol (MIRALAX  / GLYCOLAX ) 17 g packet Take 17 g by mouth daily. 12/28/20   Wouk, Devaughn Sayres, MD  risperiDONE  (RISPERDAL ) 0.5 MG tablet Take 0.5 mg by mouth 2 (two) times daily.    [provider]  rosuvastatin  (CRESTOR ) 10 MG tablet Take 1 tablet (10 mg total) by mouth daily. Patient taking differently: Take 10 mg by mouth at bedtime. 11/25/20   Melvin Pao, NP  sennosides-docusate sodium  (SENOKOT-S) 8.6-50 MG tablet Take 1 tablet by mouth in the morning and at bedtime.    [provider]    Allergies: Penicillins    Review of Systems  Constitutional:  Negative for chills and fever.  HENT:  Negative for ear pain and sore throat.   Eyes:  Negative for pain and visual disturbance.  Respiratory:  Negative for cough and shortness of breath.   Cardiovascular:  Positive for syncope. Negative for chest pain and palpitations.  Gastrointestinal:  Negative for abdominal pain and vomiting.  Genitourinary:  Negative for dysuria and hematuria.  Musculoskeletal:  Negative for arthralgias and back pain.  Skin:  Negative for color change and rash.  Neurological:  Positive for syncope. Negative for seizures.  All other systems reviewed and are negative.   Updated Vital Signs BP (!) 156/123 (BP Location: Right Arm)   Pulse 64   Temp 98.2 F (36.8 C) (Oral)   Resp 16   Ht 5' 5 (1.651 m)   Wt 70 kg   SpO2 100%   BMI 25.68 kg/m   Physical Exam Vitals and nursing note reviewed.  Constitutional:      General: She is not in acute distress.    Appearance: She is well-developed.  HENT:     Head: Normocephalic and atraumatic.  Eyes:     Conjunctiva/sclera: Conjunctivae normal.  Cardiovascular:     Rate and Rhythm: Normal rate and regular rhythm.     Heart sounds: No murmur heard. Pulmonary:     Effort: Pulmonary effort is normal. No respiratory distress.     Breath sounds: Normal breath sounds.  Abdominal:     Palpations: Abdomen is soft.     Tenderness: There is no abdominal tenderness.  Musculoskeletal:        General: No swelling.     Cervical back: Neck supple.  Skin:    General: Skin is warm and dry.     Capillary Refill: Capillary refill takes  less than 2 seconds.  Neurological:     Mental Status: She is alert and oriented to person, place, and time.     GCS: GCS eye subscore is 4. GCS verbal subscore is 5. GCS motor subscore is 6.     Cranial Nerves: Cranial nerves 2-12 are intact.     Motor: Motor function is intact.     Comments: Contractures of the left upper extremity.  No movement.  Sensation intact.  Left lower extremity sensation intact.  No movement.  Normal sensation and movement of the right upper and lower extremity.  Follows commands without difficulty.  Psychiatric:        Mood and Affect: Mood normal.     (all labs ordered are listed, but only abnormal results are displayed) Labs Reviewed  CBC WITH DIFFERENTIAL/PLATELET  BASIC METABOLIC PANEL WITH GFR  BRAIN NATRIURETIC PEPTIDE  URINALYSIS, ROUTINE W REFLEX MICROSCOPIC  CBG MONITORING, ED  TROPONIN I (HIGH SENSITIVITY)    EKG: None  Radiology: No results found.  {Document cardiac monitor, telemetry assessment procedure when appropriate:32947} Procedures   Medications Ordered in the ED - No data to display    {Click here for ABCD2, HEART and other calculators REFRESH Note before signing:1}                              Medical Decision Making Amount and/or Complexity of Data Reviewed Labs: ordered. Radiology: ordered.   ***  {Document critical care time when appropriate  Document review of labs and clinical decision tools ie CHADS2VASC2, etc  Document your independent review of radiology images and any outside records  Document your discussion with family members, caretakers and with consultants  Document social determinants of health affecting pt's care  Document your decision making why or why not admission, treatments were needed:32947:::1}   Final diagnoses:  None    ED Discharge Orders     None

## 2024-01-29 NOTE — ED Triage Notes (Signed)
 PT arrives via EMS from [facility] with a complaint of a possible syncopal episode this evening witnessed by staff. Staff states the pt was sitting in a chair and her eyes rolled back and she leaned back in the chair for a few minutes. Pt woke back up and is not A/O4. Hx of prior stroke with L sided weakness and facial droop. Pt at baseline.

## 2024-01-30 DIAGNOSIS — R55 Syncope and collapse: Secondary | ICD-10-CM | POA: Diagnosis not present

## 2024-01-30 NOTE — ED Notes (Signed)
 RN placed pt red hearing aid in right ear and blue hearing aid in left ear. Pt watch was placed on right wrist.

## 2024-01-30 NOTE — ED Notes (Signed)
 Pt provided sandwich and beverage  RN placed pt socks on her feet

## 2024-01-30 NOTE — Discharge Instructions (Addendum)
 Please follow-up with your primary care physician in the next 5 to 7 days for today's visit on syncope and diagnosis of urinary tract infection.

## 2024-01-30 NOTE — ED Notes (Signed)
 RN discussed pt d.c paperwork and explained how her UTI will be treated.

## 2024-01-31 LAB — URINE CULTURE
